# Patient Record
Sex: Female | Born: 1935 | Race: White | Hispanic: No | Smoking: Former smoker
Health system: Southern US, Community
[De-identification: ages and names within clinical notes are randomized; demographics above are authoritative.]

## PROBLEM LIST (undated history)

## (undated) DIAGNOSIS — M1990 Unspecified osteoarthritis, unspecified site: Secondary | ICD-10-CM

## (undated) DIAGNOSIS — J189 Pneumonia, unspecified organism: Secondary | ICD-10-CM

## (undated) DIAGNOSIS — T884XXA Failed or difficult intubation, initial encounter: Secondary | ICD-10-CM

## (undated) DIAGNOSIS — S82899A Other fracture of unspecified lower leg, initial encounter for closed fracture: Secondary | ICD-10-CM

## (undated) DIAGNOSIS — K219 Gastro-esophageal reflux disease without esophagitis: Secondary | ICD-10-CM

## (undated) DIAGNOSIS — C4491 Basal cell carcinoma of skin, unspecified: Secondary | ICD-10-CM

## (undated) DIAGNOSIS — R7303 Prediabetes: Secondary | ICD-10-CM

## (undated) DIAGNOSIS — I10 Essential (primary) hypertension: Secondary | ICD-10-CM

## (undated) HISTORY — DX: Basal cell carcinoma of skin, unspecified: C44.91

## (undated) HISTORY — PX: JOINT REPLACEMENT: SHX530

## (undated) HISTORY — PX: EYE SURGERY: SHX253

## (undated) HISTORY — PX: ABDOMINAL HYSTERECTOMY: SHX81

---

## 1998-08-26 ENCOUNTER — Encounter: Admission: RE | Admit: 1998-08-26 | Discharge: 1998-11-24 | Payer: Self-pay | Admitting: Family Medicine

## 1998-09-23 ENCOUNTER — Ambulatory Visit (HOSPITAL_COMMUNITY): Admission: RE | Admit: 1998-09-23 | Discharge: 1998-09-23 | Payer: Self-pay | Admitting: Neurosurgery

## 1998-10-07 ENCOUNTER — Ambulatory Visit (HOSPITAL_COMMUNITY): Admission: RE | Admit: 1998-10-07 | Discharge: 1998-10-07 | Payer: Self-pay | Admitting: Diagnostic Radiology

## 1998-10-21 ENCOUNTER — Ambulatory Visit (HOSPITAL_COMMUNITY): Admission: RE | Admit: 1998-10-21 | Discharge: 1998-10-21 | Payer: Self-pay | Admitting: Neurosurgery

## 1999-06-26 ENCOUNTER — Ambulatory Visit (HOSPITAL_COMMUNITY): Admission: RE | Admit: 1999-06-26 | Discharge: 1999-06-26 | Payer: Self-pay | Admitting: Gastroenterology

## 1999-07-28 HISTORY — PX: FRACTURE SURGERY: SHX138

## 2000-01-23 ENCOUNTER — Inpatient Hospital Stay (HOSPITAL_COMMUNITY): Admission: AD | Admit: 2000-01-23 | Discharge: 2000-01-27 | Payer: Self-pay | Admitting: Orthopedic Surgery

## 2000-01-23 ENCOUNTER — Encounter: Payer: Self-pay | Admitting: Orthopedic Surgery

## 2000-01-24 ENCOUNTER — Encounter: Payer: Self-pay | Admitting: Orthopedic Surgery

## 2001-06-02 ENCOUNTER — Ambulatory Visit (HOSPITAL_BASED_OUTPATIENT_CLINIC_OR_DEPARTMENT_OTHER): Admission: RE | Admit: 2001-06-02 | Discharge: 2001-06-02 | Payer: Self-pay | Admitting: Orthopedic Surgery

## 2001-10-06 ENCOUNTER — Other Ambulatory Visit: Admission: RE | Admit: 2001-10-06 | Discharge: 2001-10-06 | Payer: Self-pay | Admitting: Gynecology

## 2003-03-13 ENCOUNTER — Other Ambulatory Visit: Admission: RE | Admit: 2003-03-13 | Discharge: 2003-03-13 | Payer: Self-pay | Admitting: Gynecology

## 2004-02-01 ENCOUNTER — Ambulatory Visit (HOSPITAL_COMMUNITY): Admission: RE | Admit: 2004-02-01 | Discharge: 2004-02-01 | Payer: Self-pay | Admitting: Neurosurgery

## 2004-02-29 ENCOUNTER — Encounter: Admission: RE | Admit: 2004-02-29 | Discharge: 2004-02-29 | Payer: Self-pay | Admitting: Neurosurgery

## 2004-03-14 ENCOUNTER — Encounter: Admission: RE | Admit: 2004-03-14 | Discharge: 2004-03-14 | Payer: Self-pay | Admitting: Neurosurgery

## 2004-04-04 ENCOUNTER — Encounter: Admission: RE | Admit: 2004-04-04 | Discharge: 2004-04-04 | Payer: Self-pay | Admitting: Neurosurgery

## 2006-02-26 ENCOUNTER — Ambulatory Visit (HOSPITAL_COMMUNITY): Admission: RE | Admit: 2006-02-26 | Discharge: 2006-02-26 | Payer: Self-pay | Admitting: Family Medicine

## 2006-05-21 ENCOUNTER — Other Ambulatory Visit: Admission: RE | Admit: 2006-05-21 | Discharge: 2006-05-21 | Payer: Self-pay | Admitting: Gynecology

## 2007-07-28 HISTORY — PX: ROTATOR CUFF REPAIR: SHX139

## 2010-09-19 ENCOUNTER — Other Ambulatory Visit: Payer: Self-pay | Admitting: Orthopedic Surgery

## 2010-09-19 ENCOUNTER — Encounter (HOSPITAL_BASED_OUTPATIENT_CLINIC_OR_DEPARTMENT_OTHER)
Admission: RE | Admit: 2010-09-19 | Discharge: 2010-09-19 | Disposition: A | Discharge status: Home / Self Care | Payer: Medicare Other | Source: Ambulatory Visit | Attending: Orthopedic Surgery | Admitting: Orthopedic Surgery

## 2010-09-19 ENCOUNTER — Ambulatory Visit
Admission: RE | Admit: 2010-09-19 | Discharge: 2010-09-19 | Disposition: A | Discharge status: Home / Self Care | Payer: Medicare Other | Source: Ambulatory Visit | Attending: Orthopedic Surgery | Admitting: Orthopedic Surgery

## 2010-09-19 DIAGNOSIS — Z01810 Encounter for preprocedural cardiovascular examination: Secondary | ICD-10-CM | POA: Insufficient documentation

## 2010-09-19 DIAGNOSIS — Z01811 Encounter for preprocedural respiratory examination: Secondary | ICD-10-CM

## 2010-09-23 ENCOUNTER — Other Ambulatory Visit: Payer: Self-pay | Admitting: Orthopedic Surgery

## 2010-09-23 ENCOUNTER — Ambulatory Visit (HOSPITAL_BASED_OUTPATIENT_CLINIC_OR_DEPARTMENT_OTHER)
Admission: RE | Admit: 2010-09-23 | Discharge: 2010-09-24 | Disposition: A | Discharge status: Home / Self Care | Payer: Medicare Other | Source: Ambulatory Visit | Attending: Orthopedic Surgery | Admitting: Orthopedic Surgery

## 2010-09-23 DIAGNOSIS — J449 Chronic obstructive pulmonary disease, unspecified: Secondary | ICD-10-CM | POA: Insufficient documentation

## 2010-09-23 DIAGNOSIS — E669 Obesity, unspecified: Secondary | ICD-10-CM | POA: Insufficient documentation

## 2010-09-23 DIAGNOSIS — Z5333 Arthroscopic surgical procedure converted to open procedure: Secondary | ICD-10-CM | POA: Insufficient documentation

## 2010-09-23 DIAGNOSIS — J4489 Other specified chronic obstructive pulmonary disease: Secondary | ICD-10-CM | POA: Insufficient documentation

## 2010-09-23 DIAGNOSIS — M19019 Primary osteoarthritis, unspecified shoulder: Secondary | ICD-10-CM | POA: Insufficient documentation

## 2010-09-23 DIAGNOSIS — Z01812 Encounter for preprocedural laboratory examination: Secondary | ICD-10-CM | POA: Insufficient documentation

## 2010-09-23 DIAGNOSIS — F329 Major depressive disorder, single episode, unspecified: Secondary | ICD-10-CM | POA: Insufficient documentation

## 2010-09-23 DIAGNOSIS — M719 Bursopathy, unspecified: Secondary | ICD-10-CM | POA: Insufficient documentation

## 2010-09-23 DIAGNOSIS — K219 Gastro-esophageal reflux disease without esophagitis: Secondary | ICD-10-CM | POA: Insufficient documentation

## 2010-09-23 DIAGNOSIS — Z87891 Personal history of nicotine dependence: Secondary | ICD-10-CM | POA: Insufficient documentation

## 2010-09-23 DIAGNOSIS — M67919 Unspecified disorder of synovium and tendon, unspecified shoulder: Secondary | ICD-10-CM | POA: Insufficient documentation

## 2010-09-23 DIAGNOSIS — F3289 Other specified depressive episodes: Secondary | ICD-10-CM | POA: Insufficient documentation

## 2010-09-23 DIAGNOSIS — M24119 Other articular cartilage disorders, unspecified shoulder: Secondary | ICD-10-CM | POA: Insufficient documentation

## 2010-09-23 LAB — POCT HEMOGLOBIN-HEMACUE: Hemoglobin: 14.7 g/dL (ref 12.0–15.0)

## 2010-09-25 NOTE — Op Note (Signed)
[name], [name]             ACCOUNT NO.:  [id number]  MEDICAL RECORD NO.:  [id number]            PATIENT TYPE:  LOCATION:                                 FACILITY:  PHYSICIAN:  Katy Fitch. Rogerick Baldwin, M.D.      DATE OF BIRTH:  DATE OF PROCEDURE:  09/23/2010 DATE OF DISCHARGE:                              OPERATIVE REPORT   PREOPERATIVE DIAGNOSIS:  Large degenerative retracted supraspinatus, infraspinatus rotator cuff tears with intrasubstance segmental tearing of supraspinatus tendon, also type 3 acromion with acromioclavicular arthropathy and arthroscopically documented significant degenerative labral changes.  POSTOPERATIVE DIAGNOSIS:  Large degenerative retracted supraspinatus, infraspinatus rotator cuff tears with intrasubstance segmental tearing of supraspinatus tendon, also type 3 acromion with acromioclavicular arthropathy and arthroscopically documented significant degenerative labral changes.  OPERATIONS:1. Diagnostic arthroscopy, right glenohumeral joint with extensive     labral debridement, synovectomy, and rotator cuff articular side     debridement with documentation of cuff tear predicament. 2. Arthroscopic subacromial bursectomy, coracoacromial ligament,     relaxation and acromioplasty with removal of large anterior medial     and lateral osteophytes. 3. Arthroscopic resection of distal clavicle. 4. Open reconstruction of retracted segmental supraspinatus,     infraspinatus rotator cuff tears utilizing double-row technique     with two PEEK corkscrews at joint line and a combination of fiber     tape mattress sutures and compression sutures with the tails of the     medial row sutures to two lateral swivel locks.  OPERATING SURGEON:  Katy Fitch. Alyene Predmore, MD  ASSISTANT:  Marveen Reeks Dasnoit PA-C  ANESTHESIA:  General endotracheal supplemented by a right interscalene block.  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  She has had the abrupt onset of significant right shoulder pain in the past several months with night pain, weakness of abduction, external rotation and difficulty with self-care and dressing.  She was referred for evaluation of management of the right shoulder.  Clinical examination revealed a type 3 acromion, AC arthropathy, reactive bone growth at the greater tuberosity and marked weakness subscaption, abduction and external rotation.  On a clinical basis, it appeared Mary Moss had a significant rotator cuff tear.  She was sent for an MRI of her shoulder, which documented a large retracted supraspinous rotator cuff tear and complete undermining of the infraspinatus.  She had labral degenerative changes.  The subscap had minor tendinopathy.  She had type 3 acromion and AC arthropathy.  We advised Mary Moss that as her cuff tear was retracting that if she wanted to repair that she should proceed with repair promptly.  She was scheduled for surgery at this time.  Preoperatively, she was advised the risks and benefits of surgery.  She understands that due to her age and quality of her tendon with a chance that we could lose purchase of the sutures on her tendon with premature shoulder motion or excessive force in the first 12 weeks following surgery.  We will carefully guide her through a very simple exercise in early postoperative period and  try to avoid any disruption of the surgical construct.  She understood that we would decompress her rotator cuff by acromioplasty, distal clavicle resection and bursectomy.  Questions were invited and answered in detail.  Preoperatively, her health status was inventoried by myself and Dr. Jairo Ben, the attending anesthesiologist.  She is cared for by Dr. Vernon Prey at [facility].  She has no known drug allergies.  Dr. Jean Rosenthal provided detailed informed  anesthesia consent recommending an interscalene block.  This was placed with ropivacaine without complication in the holding area leading to excellent anesthesia of the right forequarter.  Questions were invited and answered in detail in the preoperative holding area followed by transfer of Mary Moss to room #2 of the [facility].  Under Dr. Edison Pace direct supervision, general anesthesia by endotracheal technique was induced followed by careful positioning of Mary Moss in the beach-chair position with aid of a torso and head holder designed for shoulder arthroscopy.  The entire right upper extremity and forequarter were prepped with DuraPrep and draped with impervious arthroscopy drapes.  After routine surgical time-out and confirmation that she had received 1 g of Ancef in the holding area as an IV prophylactic antibiotic, surgery commenced with instrumentation of the shoulder through a standard posterior- viewing portal blunt with technique.  Diagnostic arthroscopy confirmed a segmental degenerative tear of the supraspinous, infraspinatus, labral degenerative changes and considerable synovitis.  An anterior portal was created under direct vision followed by use of a suction shaver to debride synovitis, the labrum and the deep surface of the rotator cuff. After photographic documentation of the cuff tear predicament and the status of the joint, the arthroscope was removed from the glenohumeral joint and placed in the subacromial space.  Extensive bursectomy was accomplished with the suction shaver brought in posterolaterally.  The coracoacromial ligament was identified with release of electrocautery. The type 3 acromion was leveled to a type 1 morphology with a suction shaver medially, anteriorly and laterally and the capsule of the Mary Moss joint was debrided.  There was a large inferior projecting osteophyte at the distal clavicle that was removed with the suction bur and  the distal centimeter clavicle was removed with the suction bur.  After hemostasis was achieved including the acromial branch, we removed the arthroscopic equipment and proceeded to a muscle-splitting incision between the anterior middle thirds of the deltoid.  This immediately revealed hypertrophic bursa laterally that was removed the lateral gutter.  The tear was segmental with several areas of delamination of the supraspinatus and the rotator interval was delaminated directly over the long head of the biceps.  The margin of the repair were freshened.  The infraspinatus was elevated.  A power bur was used to lower the profile of the greater tuberosity 3 mm to a bleeding bone surface.  Two PEEK medial corkscrew anchors were placed, one at the central aspect of the infraspinatus and one at the anterior margin along the joint line for the supraspinatus.  A Scorpion suture passer was used to place a baseball stitch across the rotator interval allowing margin convergence and re-lamination of the layers of the supraspinatus.  The infraspinatus was gathered and brought anteriorly with a second mattress suture of fiber tape.  The corkscrew anchors were used to anatomically replace the medial footprint of the supraspinatus, infraspinatus followed by use of a baseball stitch to close the rotator interval.  The two fiber tapes were gathered into a pair of swivel locks laterally with crisscross compression  technique for the lateral margin of the supraspinatus and infraspinatus.  Excellent inset was achieved with low profile.  A hand rasp was used to feather the edge of the acromion, so that there would be no impingement postoperatively.  After feathers, bursectomy and hemostasis, the subacromial space was power irrigated with the scope cannula followed by repair of the deltoid with interrupted suture of 0 Vicryl followed by repair of the skin with subcutaneous 2-0 Vicryl with interdermal 3-0  Prolene.  A very satisfactory repair was achieved.  Due to the fragile nature of Mary Moss' tendon, we will move very slow in the postoperative period.  We will have her perform very minimal pendulum exercises and allow healing of the rotator cuff to the tuberosity prior to initiating a significant exercise program.     Katy Fitch. Semir Brill, M.D.     RVS/MEDQ  D:  09/23/2010  T:  09/24/2010  Job:  [id number]  cc:   Ernestina Penna, M.D.  Electronically Signed by Josephine Igo M.D. on 09/25/2010 10:25:13 AM

## 2011-08-06 DIAGNOSIS — L570 Actinic keratosis: Secondary | ICD-10-CM | POA: Diagnosis not present

## 2011-08-06 DIAGNOSIS — D1801 Hemangioma of skin and subcutaneous tissue: Secondary | ICD-10-CM | POA: Diagnosis not present

## 2012-01-04 ENCOUNTER — Other Ambulatory Visit: Payer: Self-pay | Admitting: Dermatology

## 2012-01-04 DIAGNOSIS — C44519 Basal cell carcinoma of skin of other part of trunk: Secondary | ICD-10-CM | POA: Diagnosis not present

## 2012-01-04 DIAGNOSIS — L821 Other seborrheic keratosis: Secondary | ICD-10-CM | POA: Diagnosis not present

## 2012-01-04 DIAGNOSIS — D239 Other benign neoplasm of skin, unspecified: Secondary | ICD-10-CM | POA: Diagnosis not present

## 2012-01-04 DIAGNOSIS — L570 Actinic keratosis: Secondary | ICD-10-CM | POA: Diagnosis not present

## 2012-01-04 DIAGNOSIS — L578 Other skin changes due to chronic exposure to nonionizing radiation: Secondary | ICD-10-CM | POA: Diagnosis not present

## 2012-02-20 DIAGNOSIS — S0510XA Contusion of eyeball and orbital tissues, unspecified eye, initial encounter: Secondary | ICD-10-CM | POA: Diagnosis not present

## 2012-02-20 DIAGNOSIS — IMO0002 Reserved for concepts with insufficient information to code with codable children: Secondary | ICD-10-CM | POA: Diagnosis not present

## 2012-04-11 DIAGNOSIS — H2510 Age-related nuclear cataract, unspecified eye: Secondary | ICD-10-CM | POA: Diagnosis not present

## 2012-05-20 DIAGNOSIS — Z23 Encounter for immunization: Secondary | ICD-10-CM | POA: Diagnosis not present

## 2012-07-08 DIAGNOSIS — Z1231 Encounter for screening mammogram for malignant neoplasm of breast: Secondary | ICD-10-CM | POA: Diagnosis not present

## 2012-07-25 DIAGNOSIS — I10 Essential (primary) hypertension: Secondary | ICD-10-CM | POA: Diagnosis not present

## 2012-07-25 DIAGNOSIS — R05 Cough: Secondary | ICD-10-CM | POA: Diagnosis not present

## 2012-07-25 DIAGNOSIS — J029 Acute pharyngitis, unspecified: Secondary | ICD-10-CM | POA: Diagnosis not present

## 2012-07-25 DIAGNOSIS — J189 Pneumonia, unspecified organism: Secondary | ICD-10-CM | POA: Diagnosis not present

## 2012-07-25 DIAGNOSIS — R5381 Other malaise: Secondary | ICD-10-CM | POA: Diagnosis not present

## 2012-08-02 DIAGNOSIS — J189 Pneumonia, unspecified organism: Secondary | ICD-10-CM | POA: Diagnosis not present

## 2012-08-29 DIAGNOSIS — Z79899 Other long term (current) drug therapy: Secondary | ICD-10-CM | POA: Diagnosis not present

## 2012-08-29 DIAGNOSIS — R7309 Other abnormal glucose: Secondary | ICD-10-CM | POA: Diagnosis not present

## 2012-08-29 DIAGNOSIS — R5383 Other fatigue: Secondary | ICD-10-CM | POA: Diagnosis not present

## 2012-08-29 DIAGNOSIS — E559 Vitamin D deficiency, unspecified: Secondary | ICD-10-CM | POA: Diagnosis not present

## 2012-08-29 DIAGNOSIS — E785 Hyperlipidemia, unspecified: Secondary | ICD-10-CM | POA: Diagnosis not present

## 2012-08-29 DIAGNOSIS — R5381 Other malaise: Secondary | ICD-10-CM | POA: Diagnosis not present

## 2012-10-10 ENCOUNTER — Other Ambulatory Visit: Payer: Self-pay | Admitting: *Deleted

## 2012-11-08 ENCOUNTER — Telehealth: Payer: Self-pay | Admitting: Family Medicine

## 2012-11-08 DIAGNOSIS — H1130 Conjunctival hemorrhage, unspecified eye: Secondary | ICD-10-CM | POA: Diagnosis not present

## 2012-11-08 NOTE — Telephone Encounter (Signed)
Offered patient an appt for 4-17 but patient refused. No appts available today

## 2012-11-18 DIAGNOSIS — H10509 Unspecified blepharoconjunctivitis, unspecified eye: Secondary | ICD-10-CM | POA: Diagnosis not present

## 2012-12-14 ENCOUNTER — Ambulatory Visit (INDEPENDENT_AMBULATORY_CARE_PROVIDER_SITE_OTHER): Payer: Medicare Other | Admitting: Pharmacist

## 2012-12-14 ENCOUNTER — Encounter: Payer: Self-pay | Admitting: Pharmacist

## 2012-12-14 ENCOUNTER — Ambulatory Visit (INDEPENDENT_AMBULATORY_CARE_PROVIDER_SITE_OTHER): Payer: Medicare Other

## 2012-12-14 VITALS — Ht 67.5 in | Wt 217.0 lb

## 2012-12-14 DIAGNOSIS — M949 Disorder of cartilage, unspecified: Secondary | ICD-10-CM

## 2012-12-14 DIAGNOSIS — M899 Disorder of bone, unspecified: Secondary | ICD-10-CM | POA: Diagnosis not present

## 2012-12-14 DIAGNOSIS — E559 Vitamin D deficiency, unspecified: Secondary | ICD-10-CM | POA: Diagnosis not present

## 2012-12-14 DIAGNOSIS — M8580 Other specified disorders of bone density and structure, unspecified site: Secondary | ICD-10-CM | POA: Insufficient documentation

## 2012-12-14 DIAGNOSIS — F411 Generalized anxiety disorder: Secondary | ICD-10-CM | POA: Insufficient documentation

## 2012-12-14 MED ORDER — VITAMIN D 1000 UNITS PO CAPS: 1000.0000 [IU] | ORAL_CAPSULE | Freq: Every day | ORAL | Status: DC

## 2012-12-14 NOTE — Progress Notes (Signed)
May want to fill in the numbers on this report

## 2012-12-14 NOTE — Progress Notes (Signed)
Patient ID: Mary Moss, female   DOB: [birth year], 77 y.o.   MRN: [id number]  [facility] Current Height: Height: 5' 7.5" (171.5 cm)      Max Lifetime Height:  5'9" Current Weight: Weight: 217 lb (98.431 kg)       Ethnicity:Caucasian    HPI: Does pt already have a diagnosis of:  Osteopenia?  Yes Osteoporosis?  No  Back Pain?  No       Kyphosis?  No Prior fracture?  No Med(s) for Osteoporosis/Osteopenia:  none Med(s) previously tried for Osteoporosis/Osteopenia:  none                                                             PMH: Age at menopause:  77 yo Hysterectomy?  Yes Oophorectomy?  Yes HRT? Yes - Former.  Type/duration: premarin for about 15 years Steroid Use?  No Thyroid med?  No History of cancer?  Yes - basal cell skin cancer History of digestive disorders (ie Crohn's)?  No Current or previous eating disorders?  No Last Vitamin D Result:  18 (08/2012) Last GFR Result:  86 (08/2012)   FH/SH: Family history of osteoporosis?  No Parent with history of hip fracture?  No Family history of breast cancer?  No Exercise?  No Smoking?  No - quit 2009 Alcohol?  No    Calcium Assessment Calcium Intake  # of servings/day  Calcium mg  Milk (8 oz) 0  x  300  = 0  Yogurt (4 oz) 1 x  200 = 200mg   Cheese (1 oz) 1/2 x  200 = 100mg   Other Calcium sources   250mg   Ca supplement 0 = 0   Estimated calcium intake per day 550mg     DEXA Results Date of Test T-Score for AP Spine L1-L4 T-Score for Total Left Hip T-Score for Total Right Hip  12/14/2012 -0.3 -0.8 -0.4  07/03/2009 -0.7 -0.8 -0.7              Assessment: Normal BMD - stable  Low vitamin D and Calcium intake  Recommendations: 1.  Start vitamin D 1000IU daily - recheck Vitamin D level in 2-3 months 2.  recommend calcium 1200mg  daily either through supplementation   or diet.   3.  recommend weight bearing exercise - 30 minutes at least 4 days   per week.  Suggested silver sneakers program at  [facility]. 4.  Counseled and educated about fall risk and prevention.  Recheck DEXA:  2 years  Time spent counseling patient:  20 minutes

## 2012-12-14 NOTE — Patient Instructions (Addendum)
Increase calcium intake - goal 1200mg  daily from diet and /or supplements Start vitamin D3 1000IU daily  Fall Prevention and Home Safety Falls cause injuries and can affect all age groups. It is possible to use preventive measures to significantly decrease the likelihood of falls. There are many simple measures which can make your home safer and prevent falls. OUTDOORS  Repair cracks and edges of walkways and driveways.  Remove high doorway thresholds.  Trim shrubbery on the main path into your home.  Have good outside lighting.  Clear walkways of tools, rocks, debris, and clutter.  Check that handrails are not broken and are securely fastened. Both sides of steps should have handrails.  Have leaves, snow, and ice cleared regularly.  Use sand or salt on walkways during winter months.  In the garage, clean up grease or oil spills. BATHROOM  Install night lights.  Install grab bars by the toilet and in the tub and shower.  Use non-skid mats or decals in the tub or shower.  Place a plastic non-slip stool in the shower to sit on, if needed.  Keep floors dry and clean up all water on the floor immediately.  Remove soap buildup in the tub or shower on a regular basis.  Secure bath mats with non-slip, double-sided rug tape.  Remove throw rugs and tripping hazards from the floors. BEDROOMS  Install night lights.  Make sure a bedside light is easy to reach.  Do not use oversized bedding.  Keep a telephone by your bedside.  Have a firm chair with side arms to use for getting dressed.  Remove throw rugs and tripping hazards from the floor. KITCHEN  Keep handles on pots and pans turned toward the center of the stove. Use back burners when possible.  Clean up spills quickly and allow time for drying.  Avoid walking on wet floors.  Avoid hot utensils and knives.  Position shelves so they are not too high or low.  Place commonly used objects within easy reach.  If  necessary, use a sturdy step stool with a grab bar when reaching.  Keep electrical cables out of the way.  Do not use floor polish or wax that makes floors slippery. If you must use wax, use non-skid floor wax.  Remove throw rugs and tripping hazards from the floor. STAIRWAYS  Never leave objects on stairs.  Place handrails on both sides of stairways and use them. Fix any loose handrails. Make sure handrails on both sides of the stairways are as long as the stairs.  Check carpeting to make sure it is firmly attached along stairs. Make repairs to worn or loose carpet promptly.  Avoid placing throw rugs at the top or bottom of stairways, or properly secure the rug with carpet tape to prevent slippage. Get rid of throw rugs, if possible.  Have an electrician put in a light switch at the top and bottom of the stairs. OTHER FALL PREVENTION TIPS  Wear low-heel or rubber-soled shoes that are supportive and fit well. Wear closed toe shoes.  When using a stepladder, make sure it is fully opened and both spreaders are firmly locked. Do not climb a closed stepladder.  Add color or contrast paint or tape to grab bars and handrails in your home. Place contrasting color strips on first and last steps.  Learn and use mobility aids as needed. Install an electrical emergency response system.  Turn on lights to avoid dark areas. Replace light bulbs that burn out immediately.  Get light switches that glow.  Arrange furniture to create clear pathways. Keep furniture in the same place.  Firmly attach carpet with non-skid or double-sided tape.  Eliminate uneven floor surfaces.  Select a carpet pattern that does not visually hide the edge of steps.  Be aware of all pets. OTHER HOME SAFETY TIPS  Set the water temperature for 120 F (48.8 C).  Keep emergency numbers on or near the telephone.  Keep smoke detectors on every level of the home and near sleeping areas. Document Released: 07/03/2002  Document Revised: 01/12/2012 Document Reviewed: 10/02/2011 [facility] Patient Information 2014 [place].

## 2013-05-31 ENCOUNTER — Ambulatory Visit (INDEPENDENT_AMBULATORY_CARE_PROVIDER_SITE_OTHER): Payer: Medicare Other

## 2013-05-31 DIAGNOSIS — Z23 Encounter for immunization: Secondary | ICD-10-CM | POA: Diagnosis not present

## 2013-07-03 ENCOUNTER — Other Ambulatory Visit: Payer: Self-pay | Admitting: Dermatology

## 2013-07-03 DIAGNOSIS — L570 Actinic keratosis: Secondary | ICD-10-CM | POA: Diagnosis not present

## 2013-07-03 DIAGNOSIS — L821 Other seborrheic keratosis: Secondary | ICD-10-CM | POA: Diagnosis not present

## 2013-07-03 DIAGNOSIS — L820 Inflamed seborrheic keratosis: Secondary | ICD-10-CM | POA: Diagnosis not present

## 2013-07-03 DIAGNOSIS — Z1231 Encounter for screening mammogram for malignant neoplasm of breast: Secondary | ICD-10-CM | POA: Diagnosis not present

## 2013-07-03 DIAGNOSIS — Z85828 Personal history of other malignant neoplasm of skin: Secondary | ICD-10-CM | POA: Diagnosis not present

## 2013-07-03 DIAGNOSIS — C44519 Basal cell carcinoma of skin of other part of trunk: Secondary | ICD-10-CM | POA: Diagnosis not present

## 2013-07-03 DIAGNOSIS — C44711 Basal cell carcinoma of skin of unspecified lower limb, including hip: Secondary | ICD-10-CM | POA: Diagnosis not present

## 2013-07-15 ENCOUNTER — Emergency Department (HOSPITAL_COMMUNITY): Payer: Medicare Other

## 2013-07-15 ENCOUNTER — Emergency Department (HOSPITAL_COMMUNITY)
Admission: EM | Admit: 2013-07-15 | Discharge: 2013-07-15 | Disposition: A | Discharge status: Home / Self Care | Payer: Medicare Other | Attending: Emergency Medicine | Admitting: Emergency Medicine

## 2013-07-15 ENCOUNTER — Encounter (HOSPITAL_COMMUNITY): Payer: Self-pay | Admitting: Emergency Medicine

## 2013-07-15 DIAGNOSIS — M25561 Pain in right knee: Secondary | ICD-10-CM

## 2013-07-15 DIAGNOSIS — Z87891 Personal history of nicotine dependence: Secondary | ICD-10-CM | POA: Insufficient documentation

## 2013-07-15 DIAGNOSIS — Z791 Long term (current) use of non-steroidal anti-inflammatories (NSAID): Secondary | ICD-10-CM | POA: Diagnosis not present

## 2013-07-15 DIAGNOSIS — M25569 Pain in unspecified knee: Secondary | ICD-10-CM | POA: Diagnosis not present

## 2013-07-15 MED ORDER — MELOXICAM 7.5 MG PO TABS: ORAL_TABLET | ORAL | Status: DC

## 2013-07-15 NOTE — ED Provider Notes (Signed)
This chart was scribed for Ward Givens, MD by Bennett Scrape, ED Scribe. This patient was seen in room APFT22/APFT22 and the patient's care was started at 4:53 PM.  HPI Comments: Mary Moss is a 77 y.o. female who presents to the Emergency Department complaining of posterior right knee pain for the past 3 weeks. She states that she feels like it locks up and becomes stiff. She denies any prior injuries to the knee or having an orthopedist. Pt states Dr Lequita Halt is her family's knee doctor.   4:53 PM- Informed pt that the problems may be mensicus related. Advised pt that she will need to f/u with an orthopedist. Discussed using the knee brace with pt and pt agreed.   Medical screening examination/treatment/procedure(s) were conducted as a shared visit with non-physician practitioner(s) and myself.  I personally evaluated the patient during the encounter.  EKG Interpretation   None        Devoria Albe, MD, Armando Gang   Ward Givens, MD 07/15/13 [phone]

## 2013-07-15 NOTE — ED Notes (Signed)
Pain to right knee x 3 wks.  Denies injury.  States "it locks up on me and I almost fall."

## 2013-07-16 NOTE — ED Provider Notes (Signed)
CSN: [id number]     Arrival date & time 07/15/13  1528 History   First MD Initiated Contact with Patient 07/15/13 1546     Chief Complaint  Patient presents with  . Knee Pain   (Consider location/radiation/quality/duration/timing/severity/associated sxs/prior Treatment) Patient is a 77 y.o. female presenting with knee pain. The history is provided by the patient.  Knee Pain Location:  Knee Time since incident:  3 weeks Injury: no   Knee location:  R knee Pain details:    Quality:  Aching and sharp   Radiates to:  Does not radiate   Severity:  Moderate   Onset quality:  Gradual   Timing:  Constant   Progression:  Worsening Chronicity:  New Dislocation: no   Foreign body present:  No foreign bodies Prior injury to area:  No Relieved by:  Rest Worsened by:  Flexion and bearing weight Ineffective treatments:  NSAIDs Associated symptoms: no back pain, no decreased ROM, no fever, no neck pain, no numbness, no stiffness, no swelling and no tingling    Patient with hx of posterior right knee pain for 3 weeks.  She denies known injury.  States the pain is intermittent and worse with weight bearing and flexion of the knee.  Describes a sharp "stinging" pain to the back of her knee that radiates into the lower thigh.  Denies pain to the medial, lateral or anterior knee.  She denies known injury or fall.  She also denies swelling, numbness, redness, fever or chills.    No hx of previous DVT or recent travel   History reviewed. No pertinent past medical history. Past Surgical History  Procedure Laterality Date  . Abdominal hysterectomy    . Rotator cuff repair    . Fracture surgery     No family history on file. History  Substance Use Topics  . Smoking status: Former Smoker    Types: Cigarettes    Start date: 07/27/1957    Quit date: 12/15/2007  . Smokeless tobacco: Not on file  . Alcohol Use: No   OB History   Grav Para Term Preterm Abortions TAB SAB Ect Mult Living           Review of Systems  Constitutional: Negative for fever and chills.  Genitourinary: Negative for dysuria and difficulty urinating.  Musculoskeletal: Positive for arthralgias. Negative for back pain, gait problem, joint swelling, neck pain and stiffness.  Skin: Negative for color change and wound.  Neurological: Negative for weakness and numbness.  All other systems reviewed and are negative.    Allergies  Review of patient's allergies indicates no known allergies.  Home Medications   Current Outpatient Rx  Name  Route  Sig  Dispense  Refill  . meloxicam (MOBIC) 7.5 MG tablet      One tablet po BID .  Take with food   20 tablet   0    BP 154/77  Pulse 85  Temp(Src) 97.7 F (36.5 C) (Oral)  Resp 20  Ht 5\' 8"  (1.727 m)  Wt 215 lb (97.523 kg)  BMI 32.70 kg/m2  SpO2 92% Physical Exam  Nursing note and vitals reviewed. Constitutional: She is oriented to person, place, and time. She appears well-developed and well-nourished. No distress.  HENT:  Head: Normocephalic and atraumatic.  Cardiovascular: Normal rate, regular rhythm, normal heart sounds and intact distal pulses.   No murmur heard. Pulmonary/Chest: Effort normal and breath sounds normal. No respiratory distress.  Musculoskeletal: She exhibits tenderness. She exhibits no edema.  Right knee: She exhibits normal range of motion, no swelling, no effusion, no ecchymosis, no deformity, no erythema, no LCL laxity, no bony tenderness and no MCL laxity. Tenderness found. No medial joint line, no lateral joint line and no patellar tendon tenderness noted.       Legs: ttp along the popliteal fossa of the right knee.  No erythema, effusion, excessive warmth or step-off deformity.  Patient has full ROM of the knee.  DP pulse brisk, distal sensation intact. Calf is soft and NT.  Neurological: She is alert and oriented to person, place, and time. She exhibits normal muscle tone. Coordination normal.  Skin: Skin is warm  and dry. No erythema.    ED Course  Procedures (including critical care time) Labs Review Labs Reviewed - No data to display Imaging Review Dg Knee Complete 4 Views Right  07/15/2013   CLINICAL DATA:  Knee pain.  EXAM: RIGHT KNEE - COMPLETE 4+ VIEW  COMPARISON:  None.  FINDINGS: No acute bony abnormality. Specifically, no fracture, subluxation, or dislocation. Soft tissues are intact. Joint spaces are maintained. Normal bone mineralization. No significant joint effusion.  IMPRESSION: No acute bony abnormality.   Electronically Signed   By: Charlett Nose M.D.   On: 07/15/2013 16:06    EKG Interpretation   None       MDM   1. Knee pain, right     XR findings discussed with the patient.  No concerning sx's for septic joint or DVT.    Knee immobilizer applied, pain improved, remains NV intact  Discussed possible Baker's cyst vs meniscal injury.  Pt requests referral to Dr.Aluisio.  Will provide symptomatic tx with mobic, rest, ice.  Pt verbalized understanding and agrees to plan.  Appears stable for discharge    Abbye Lao L. Trisha Mangle, PA-C 07/16/13 [phone]

## 2013-07-16 NOTE — ED Provider Notes (Signed)
See prior note   Ward Givens, MD 07/16/13 [phone]

## 2013-07-27 DIAGNOSIS — C4491 Basal cell carcinoma of skin, unspecified: Secondary | ICD-10-CM

## 2013-07-27 HISTORY — DX: Basal cell carcinoma of skin, unspecified: C44.91

## 2013-08-10 DIAGNOSIS — M25569 Pain in unspecified knee: Secondary | ICD-10-CM | POA: Diagnosis not present

## 2013-08-16 DIAGNOSIS — M25569 Pain in unspecified knee: Secondary | ICD-10-CM | POA: Diagnosis not present

## 2013-08-28 DIAGNOSIS — M25569 Pain in unspecified knee: Secondary | ICD-10-CM | POA: Diagnosis not present

## 2013-11-01 ENCOUNTER — Other Ambulatory Visit: Payer: Self-pay | Admitting: Dermatology

## 2013-11-01 DIAGNOSIS — Z85828 Personal history of other malignant neoplasm of skin: Secondary | ICD-10-CM | POA: Diagnosis not present

## 2013-11-01 DIAGNOSIS — C44319 Basal cell carcinoma of skin of other parts of face: Secondary | ICD-10-CM | POA: Diagnosis not present

## 2013-11-24 ENCOUNTER — Ambulatory Visit (INDEPENDENT_AMBULATORY_CARE_PROVIDER_SITE_OTHER): Payer: Medicare Other | Admitting: Family

## 2013-11-24 VITALS — BP 165/90 | HR 87 | Temp 97.1°F | Ht 68.0 in | Wt 219.0 lb

## 2013-11-24 DIAGNOSIS — J019 Acute sinusitis, unspecified: Secondary | ICD-10-CM

## 2013-11-24 DIAGNOSIS — R509 Fever, unspecified: Secondary | ICD-10-CM | POA: Diagnosis not present

## 2013-11-24 LAB — POCT INFLUENZA A/B
INFLUENZA A, POC: NEGATIVE
INFLUENZA B, POC: NEGATIVE

## 2013-11-24 LAB — POCT RAPID STREP A (OFFICE): RAPID STREP A SCREEN: NEGATIVE

## 2013-11-24 MED ORDER — AMOXICILLIN 500 MG PO CAPS
500.0000 mg | ORAL_CAPSULE | Freq: Two times a day (BID) | ORAL | Status: DC
Start: 2013-11-24 — End: 2013-12-08

## 2013-11-24 MED ORDER — BENZONATATE 200 MG PO CAPS: 200.0000 mg | ORAL_CAPSULE | Freq: Three times a day (TID) | ORAL | Status: DC | PRN

## 2013-11-24 NOTE — Progress Notes (Signed)
Subjective:    Patient ID: Mary Moss, female    DOB: 09/10/1935, 78 y.o.   MRN: [id number]  Cough This is a new problem. The current episode started 1 to 4 weeks ago. The problem has been gradually worsening. The problem occurs every few minutes. The cough is productive of purulent sputum. Associated symptoms include chills, a fever, headaches, nasal congestion, a sore throat, shortness of breath and wheezing. Pertinent negatives include no ear congestion, ear pain or postnasal drip. The symptoms are aggravated by pollens. Treatments tried: Saline spray and Tyelnol. The treatment provided mild relief. There is no history of asthma or COPD.      Review of Systems  Constitutional: Positive for fever and chills.  HENT: Positive for sore throat. Negative for ear pain and postnasal drip.   Respiratory: Positive for cough, shortness of breath and wheezing.   Gastrointestinal: Negative.   Genitourinary: Negative.   Musculoskeletal: Negative.   Neurological: Positive for headaches.  All other systems reviewed and are negative.      Objective:   Physical Exam  Vitals reviewed. Constitutional: She is oriented to person, place, and time. She appears well-developed and well-nourished. No distress.  HENT:  Head: Normocephalic and atraumatic.  Right Ear: External ear normal.  Left Ear: External ear normal.  Nasal passage erythemas with mild swelling, oropharyngeal mildly erythemas  Eyes: Pupils are equal, round, and reactive to light.  Neck: Normal range of motion. Neck supple. No thyromegaly present.  Cardiovascular: Normal rate, regular rhythm, normal heart sounds and intact distal pulses.   No murmur heard. Pulmonary/Chest: Effort normal and breath sounds normal. No respiratory distress. She has no wheezes.  Musculoskeletal: Normal range of motion. She exhibits no edema and no tenderness.  Neurological: She is alert and oriented to person, place, and time. She has normal  reflexes.  Skin: Skin is warm and dry.  Psychiatric: She has a normal mood and affect. Her behavior is normal. Judgment and thought content normal.    Results for orders placed in visit on 11/24/13  POCT RAPID STREP A (OFFICE)      Result Value Ref Range   Rapid Strep A Screen Negative  Negative  POCT INFLUENZA A/B      Result Value Ref Range   Influenza A, POC Negative     Influenza B, POC Negative         BP 160/84  Pulse 87  Temp(Src) 97.1 F (36.2 C) (Oral)  Ht 5\' 8"  (1.727 m)  Wt 219 lb (99.338 kg)  BMI 33.31 kg/m2  Assessment & Plan:  1. Fever, unspecified - POCT rapid strep A - POCT Influenza A/B - Strep A culture, throat  2. Sinusitis, acute - Take meds as prescribed - Use a cool mist humidifier  -Use saline nose sprays frequently -Saline irrigations of the nose can be very helpful if done frequently.  * 4X daily for 1 week*  * Use of a nettie pot can be helpful with this. Follow directions with this* -Force fluids -For any cough or congestion  Use plain Mucinex- regular strength or max strength is fine   * Children- consult with Pharmacist for dosing -For fever or aces or pains- take tylenol or ibuprofen appropriate for age and weight.  * for fevers greater than 101 orally you may alternate ibuprofen and tylenol every  3 hours. -Throat lozenges if help -New toothbrush in 3 days -Start daily Claritin for at least next month  3. B/P -Discussed pt's elevated  B/P -Info on DASH diet given -RTO in 2 weeks for recheck   Evelina Dun, FNP

## 2013-11-24 NOTE — Patient Instructions (Addendum)
Sinusitis Sinusitis is redness, soreness, and swelling (inflammation) of the paranasal sinuses. Paranasal sinuses are air pockets within the bones of your face (beneath the eyes, the middle of the forehead, or above the eyes). In healthy paranasal sinuses, mucus is able to drain out, and air is able to circulate through them by way of your nose. However, when your paranasal sinuses are inflamed, mucus and air can become trapped. This can allow bacteria and other germs to grow and cause infection. Sinusitis can develop quickly and last only a short time (acute) or continue over a long period (chronic). Sinusitis that lasts for more than 12 weeks is considered chronic.  CAUSES  Causes of sinusitis include:  Allergies.  Structural abnormalities, such as displacement of the cartilage that separates your nostrils (deviated septum), which can decrease the air flow through your nose and sinuses and affect sinus drainage.  Functional abnormalities, such as when the small hairs (cilia) that line your sinuses and help remove mucus do not work properly or are not present. SYMPTOMS  Symptoms of acute and chronic sinusitis are the same. The primary symptoms are pain and pressure around the affected sinuses. Other symptoms include:  Upper toothache.  Earache.  Headache.  Bad breath.  Decreased sense of smell and taste.  A cough, which worsens when you are lying flat.  Fatigue.  Fever.  Thick drainage from your nose, which often is green and may contain pus (purulent).  Swelling and warmth over the affected sinuses. DIAGNOSIS  Your caregiver will perform a physical exam. During the exam, your caregiver may:  Look in your nose for signs of abnormal growths in your nostrils (nasal polyps).  Tap over the affected sinus to check for signs of infection.  View the inside of your sinuses (endoscopy) with a special imaging device with a light attached (endoscope), which is inserted into your  sinuses. If your caregiver suspects that you have chronic sinusitis, one or more of the following tests may be recommended:  Allergy tests.  Nasal culture A sample of mucus is taken from your nose and sent to a lab and screened for bacteria.  Nasal cytology A sample of mucus is taken from your nose and examined by your caregiver to determine if your sinusitis is related to an allergy. TREATMENT  Most cases of acute sinusitis are related to a viral infection and will resolve on their own within 10 days. Sometimes medicines are prescribed to help relieve symptoms (pain medicine, decongestants, nasal steroid sprays, or saline sprays).  However, for sinusitis related to a bacterial infection, your caregiver will prescribe antibiotic medicines. These are medicines that will help kill the bacteria causing the infection.  Rarely, sinusitis is caused by a fungal infection. In theses cases, your caregiver will prescribe antifungal medicine. For some cases of chronic sinusitis, surgery is needed. Generally, these are cases in which sinusitis recurs more than 3 times per year, despite other treatments. HOME CARE INSTRUCTIONS   Drink plenty of water. Water helps thin the mucus so your sinuses can drain more easily.  Use a humidifier.  Inhale steam 3 to 4 times a day (for example, sit in the bathroom with the shower running).  Apply a warm, moist washcloth to your face 3 to 4 times a day, or as directed by your caregiver.  Use saline nasal sprays to help moisten and clean your sinuses.  Take over-the-counter or prescription medicines for pain, discomfort, or fever only as directed by your caregiver. SEEK IMMEDIATE MEDICAL   CARE IF:  You have increasing pain or severe headaches.  You have nausea, vomiting, or drowsiness.  You have swelling around your face.  You have vision problems.  You have a stiff neck.  You have difficulty breathing. MAKE SURE YOU:   Understand these  instructions.  Will watch your condition.  Will get help right away if you are not doing well or get worse. Document Released: 07/13/2005 Document Revised: 10/05/2011 Document Reviewed: 07/28/2011 [facility] Patient Information 2014 [place], [place]. DASH Diet The DASH diet stands for "Dietary Approaches to Stop Hypertension." It is a healthy eating plan that has been shown to reduce high blood pressure (hypertension) in as little as 14 days, while also possibly providing other significant health benefits. These other health benefits include reducing the risk of breast cancer after menopause and reducing the risk of type 2 diabetes, heart disease, colon cancer, and stroke. Health benefits also include weight loss and slowing kidney failure in patients with chronic kidney disease.  DIET GUIDELINES  Limit salt (sodium). Your diet should contain less than 1500 mg of sodium daily.  Limit refined or processed carbohydrates. Your diet should include mostly whole grains. Desserts and added sugars should be used sparingly.  Include small amounts of heart-healthy fats. These types of fats include nuts, oils, and tub margarine. Limit saturated and trans fats. These fats have been shown to be harmful in the body. CHOOSING FOODS  The following food groups are based on a 2000 calorie diet. See your Registered Dietitian for individual calorie needs. Grains and Grain Products (6 to 8 servings daily)  Eat More Often: Whole-wheat bread, brown rice, whole-grain or wheat pasta, quinoa, popcorn without added fat or salt (air popped).  Eat Less Often: White bread, white pasta, white rice, cornbread. Vegetables (4 to 5 servings daily)  Eat More Often: Fresh, frozen, and canned vegetables. Vegetables may be raw, steamed, roasted, or grilled with a minimal amount of fat.  Eat Less Often/Avoid: Creamed or fried vegetables. Vegetables in a cheese sauce. Fruit (4 to 5 servings daily)  Eat More Often: All fresh,  canned (in natural juice), or frozen fruits. Dried fruits without added sugar. One hundred percent fruit juice ( cup [237 mL] daily).  Eat Less Often: Dried fruits with added sugar. Canned fruit in light or heavy syrup. [employer], Fish, and Poultry (2 servings or less daily. One serving is 3 to 4 oz [85-114 g]).  Eat More Often: Ninety percent or leaner ground beef, tenderloin, sirloin. Round cuts of beef, chicken breast, [place] breast. All fish. Grill, bake, or broil your meat. Nothing should be fried.  Eat Less Often/Avoid: Fatty cuts of meat, [place], or chicken leg, thigh, or wing. Fried cuts of meat or fish. Dairy (2 to 3 servings)  Eat More Often: Low-fat or fat-free milk, low-fat plain or light yogurt, reduced-fat or part-skim cheese.  Eat Less Often/Avoid: Milk (whole, 2%).Whole milk yogurt. Full-fat cheeses. Nuts, Seeds, and Legumes (4 to 5 servings per week)  Eat More Often: All without added salt.  Eat Less Often/Avoid: Salted nuts and seeds, canned beans with added salt. Fats and Sweets (limited)  Eat More Often: Vegetable oils, tub margarines without trans fats, sugar-free gelatin. Mayonnaise and salad dressings.  Eat Less Often/Avoid: Coconut oils, palm oils, butter, stick margarine, cream, half and half, cookies, candy, pie. FOR MORE INFORMATION The Dash Diet Eating Plan: www.dashdiet.org Document Released: 07/02/2011 Document Revised: 10/05/2011 Document Reviewed: 07/02/2011 [facility] Patient Information 2014 [place], [place].  - Take meds  as prescribed - Use a cool mist humidifier  -Use saline nose sprays frequently -Saline irrigations of the nose can be very helpful if done frequently.  * 4X daily for 1 week*  * Use of a nettie pot can be helpful with this. Follow directions with this* -Force fluids -For any cough or congestion  Use plain Mucinex- regular strength or max strength is fine   * Children- consult with Pharmacist for dosing -For fever or aces or  pains- take tylenol or ibuprofen appropriate for age and weight.  * for fevers greater than 101 orally you may alternate ibuprofen and tylenol every  3 hours. -Throat lozenges if help -New toothbrush in 3 days -Start daily Claritin for at least next month   Evelina Dun, FNP

## 2013-11-26 LAB — STREP A CULTURE, THROAT: STREP A CULTURE: NEGATIVE

## 2013-12-04 DIAGNOSIS — Z85828 Personal history of other malignant neoplasm of skin: Secondary | ICD-10-CM | POA: Diagnosis not present

## 2013-12-04 DIAGNOSIS — C44319 Basal cell carcinoma of skin of other parts of face: Secondary | ICD-10-CM | POA: Diagnosis not present

## 2013-12-08 ENCOUNTER — Ambulatory Visit (INDEPENDENT_AMBULATORY_CARE_PROVIDER_SITE_OTHER): Payer: Medicare Other | Admitting: Family

## 2013-12-08 ENCOUNTER — Encounter: Payer: Self-pay | Admitting: Family

## 2013-12-08 VITALS — BP 148/79 | HR 75 | Temp 97.1°F | Ht 68.0 in | Wt 221.0 lb

## 2013-12-08 DIAGNOSIS — IMO0001 Reserved for inherently not codable concepts without codable children: Secondary | ICD-10-CM

## 2013-12-08 DIAGNOSIS — R030 Elevated blood-pressure reading, without diagnosis of hypertension: Secondary | ICD-10-CM | POA: Diagnosis not present

## 2013-12-08 NOTE — Progress Notes (Signed)
   Subjective:    Patient ID: Mary Moss, female    DOB: [birth year], 78 y.o.   MRN: [id number]  Hypertension   Pt here for follow-up for her BP. Two weeks her BP was 165/90 on a sick visit. Pt states she is feeling much better. She denies any pain, headaches, dizziness, or SOB. Pt would like to try to lose a few pounds and try a low salt diet before starting on medications.    Review of Systems  HENT: Negative.   Cardiovascular: Negative.   Gastrointestinal: Negative.   Genitourinary: Negative.   Musculoskeletal: Negative.   Neurological: Negative.   All other systems reviewed and are negative.      Objective:   Physical Exam  Constitutional: She is oriented to person, place, and time. She appears well-developed and well-nourished.  Cardiovascular: Normal rate, regular rhythm, normal heart sounds and intact distal pulses.   No murmur heard. Pulmonary/Chest: Effort normal and breath sounds normal. No respiratory distress. She has no wheezes.  Abdominal: Soft. Bowel sounds are normal. She exhibits no distension. There is no tenderness.  Musculoskeletal: Normal range of motion. She exhibits no edema.  Neurological: She is alert and oriented to person, place, and time.  Skin: Skin is warm and dry. No erythema.  Psychiatric: She has a normal mood and affect. Her behavior is normal. Judgment and thought content normal.      BP 148/79  Pulse 75  Temp(Src) 97.1 F (36.2 C) (Oral)  Ht _0  (1.727 m)  Wt 221 lb (100.245 kg)  BMI 33.61 kg/m2     Assessment & Plan:  1. Elevated blood pressure -Daily blood pressure log given with instructions on how to fill out and told to bring to next visit -Dash diet information given -Exercise encouraged - Stress Management  - BMP8+EGFR -RTO 6 months   Evelina Dun, FNP

## 2013-12-08 NOTE — Patient Instructions (Signed)

## 2013-12-09 LAB — BMP8+EGFR
BUN/Creatinine Ratio: 17 (ref 11–26)
BUN: 17 mg/dL (ref 8–27)
CALCIUM: 9.7 mg/dL (ref 8.7–10.3)
CO2: 22 mmol/L (ref 18–29)
Chloride: 101 mmol/L (ref 97–108)
Creatinine, Ser: 0.99 mg/dL (ref 0.57–1.00)
GFR calc Af Amer: 64 mL/min/{1.73_m2} (ref >59)
GFR calc non Af Amer: 56 mL/min/{1.73_m2} — ABNORMAL LOW (ref >59)
GLUCOSE: 137 mg/dL — AB (ref 65–99)
Potassium: 4.3 mmol/L (ref 3.5–5.2)
Sodium: 141 mmol/L (ref 134–144)

## 2013-12-11 ENCOUNTER — Telehealth: Payer: Self-pay | Admitting: Family Medicine

## 2013-12-11 NOTE — Telephone Encounter (Signed)
Message copied by Waverly Ferrari on Mon Dec 11, 2013  9:54 AM ------      Message from: Lenna Gilford, [place] A      Created: Mon Dec 11, 2013  9:17 AM       Kidney and liver function stable      Encourage low salt diet and exercise ------

## 2013-12-11 NOTE — Telephone Encounter (Signed)
Pt aware of lab results.  rs °

## 2014-01-22 NOTE — Progress Notes (Signed)
Surgery on 02/07/14.   Preop on 01/28/14 at 1000am. Need orders in EPIC.  Thank You. -

## 2014-01-23 ENCOUNTER — Other Ambulatory Visit: Payer: Self-pay | Admitting: Orthopedic Surgery

## 2014-01-23 ENCOUNTER — Encounter (HOSPITAL_COMMUNITY): Payer: Self-pay | Admitting: Pharmacy Technician

## 2014-01-25 DIAGNOSIS — H2510 Age-related nuclear cataract, unspecified eye: Secondary | ICD-10-CM | POA: Diagnosis not present

## 2014-01-25 NOTE — Patient Instructions (Addendum)
Your procedure is scheduled on:  02/07/14  [facility]  Report to [facility] at    10:30   AM.   Call this number if you have problems the morning of surgery: [phone]        Do not eat food or liquid  After Midnight.TUESDAY NIGHT Take these medicines the morning of surgery with A SIP OF WATER:NONE   .  Contacts, dentures or partial plates, or metal hairpins  can not be worn to surgery. Your family will be responsible for glasses, dentures, hearing aides while you are in surgery  Leave suitcase in the car. After surgery it may be brought to your room.  For patients admitted to the hospital, checkout time is 11:00 AM day of  discharge.         [facility] IS NOT RESPONSIBLE FOR ANY VALUABLES  Patients discharged the day of surgery will not be allowed to drive home. IF going home the day of surgery, you must have a driver and someone to stay with you for the first 24 hours  Name and phone number of your driver:                                                                        Sister in law sheila carringer cell [place] - Preparing for Surgery Before surgery, you can play an important role.  Because skin is not sterile, your skin needs to be as free of germs as possible.  You can reduce the number of germs on your skin by washing with CHG (chlorahexidine gluconate) soap before surgery.  CHG is an antiseptic cleaner which kills germs and bonds with the skin to continue killing germs even after washing. Please DO NOT use if you have an allergy to CHG or antibacterial soaps.  If your skin becomes reddened/irritated stop using the CHG and inform your nurse when you arrive at Short Stay. Do not shave (including legs and underarms) for at least 48 hours prior to the first CHG shower.  You may shave your face/neck. Please follow these  instructions carefully:  1.  Shower with CHG Soap the night before surgery and the  morning of Surgery.  2.  If you choose to wash your hair, wash your hair first as usual with your  normal  shampoo.  3.  After you shampoo, rinse your hair and body thoroughly to remove the  shampoo.                           4.  Use CHG as you would any other liquid soap.  You can apply chg directly  to the skin and wash  Gently with a scrungie or clean washcloth.  5.  Apply the CHG Soap to your body ONLY FROM THE NECK DOWN.   Do not use on face/ open                           Wound or open sores. Avoid contact with eyes, ears mouth and genitals (private parts).                       Wash face,  Genitals (private parts) with your normal soap.             6.  Wash thoroughly, paying special attention to the area where your surgery  will be performed.  7.  Thoroughly rinse your body with warm water from the neck down.  8.  DO NOT shower/wash with your normal soap after using and rinsing off  the CHG Soap.                9.  Pat yourself dry with a clean towel.            10.  Wear clean pajamas.            11.  Place clean sheets on your bed the night of your first shower and do not  sleep with pets. Day of Surgery : Do not apply any lotions/deodorants the morning of surgery.  Please wear clean clothes to the hospital/surgery center.  FAILURE TO FOLLOW THESE INSTRUCTIONS MAY RESULT IN THE CANCELLATION OF YOUR SURGERY PATIENT SIGNATURE_________________________________  NURSE SIGNATURE__________________________________  ________________________________________________________________________

## 2014-01-29 ENCOUNTER — Encounter (HOSPITAL_COMMUNITY)
Admission: RE | Admit: 2014-01-29 | Discharge: 2014-01-29 | Disposition: A | Discharge status: Home / Self Care | Payer: Medicare Other | Source: Ambulatory Visit | Attending: Orthopedic Surgery | Admitting: Orthopedic Surgery

## 2014-01-29 ENCOUNTER — Ambulatory Visit (HOSPITAL_COMMUNITY)
Admission: RE | Admit: 2014-01-29 | Discharge: 2014-01-29 | Disposition: A | Discharge status: Home / Self Care | Payer: Medicare Other | Source: Ambulatory Visit | Attending: Anesthesiology | Admitting: Anesthesiology

## 2014-01-29 ENCOUNTER — Encounter (HOSPITAL_COMMUNITY): Payer: Self-pay

## 2014-01-29 ENCOUNTER — Encounter (INDEPENDENT_AMBULATORY_CARE_PROVIDER_SITE_OTHER): Payer: Self-pay

## 2014-01-29 DIAGNOSIS — I10 Essential (primary) hypertension: Secondary | ICD-10-CM | POA: Insufficient documentation

## 2014-01-29 DIAGNOSIS — Z01818 Encounter for other preprocedural examination: Secondary | ICD-10-CM | POA: Insufficient documentation

## 2014-01-29 DIAGNOSIS — Z01812 Encounter for preprocedural laboratory examination: Secondary | ICD-10-CM | POA: Diagnosis not present

## 2014-01-29 DIAGNOSIS — Z01810 Encounter for preprocedural cardiovascular examination: Secondary | ICD-10-CM | POA: Insufficient documentation

## 2014-01-29 DIAGNOSIS — I517 Cardiomegaly: Secondary | ICD-10-CM | POA: Diagnosis not present

## 2014-01-29 HISTORY — DX: Other fracture of unspecified lower leg, initial encounter for closed fracture: S82.899A

## 2014-01-29 HISTORY — DX: Gastro-esophageal reflux disease without esophagitis: K21.9

## 2014-01-29 LAB — CBC
HEMATOCRIT: 44.4 % (ref 36.0–46.0)
Hemoglobin: 14.1 g/dL (ref 12.0–15.0)
MCH: 30.4 pg (ref 26.0–34.0)
MCHC: 31.8 g/dL (ref 30.0–36.0)
MCV: 95.7 fL (ref 78.0–100.0)
Platelets: 253 10*3/uL (ref 150–400)
RBC: 4.64 MIL/uL (ref 3.87–5.11)
RDW: 13 % (ref 11.5–15.5)
WBC: 6.7 10*3/uL (ref 4.0–10.5)

## 2014-01-29 LAB — BASIC METABOLIC PANEL
ANION GAP: 10 (ref 5–15)
BUN: 19 mg/dL (ref 6–23)
CHLORIDE: 102 meq/L (ref 96–112)
CO2: 28 mEq/L (ref 19–32)
CREATININE: 0.77 mg/dL (ref 0.50–1.10)
Calcium: 9.2 mg/dL (ref 8.4–10.5)
GFR calc non Af Amer: 80 mL/min — ABNORMAL LOW (ref >90)
Glucose, Bld: 100 mg/dL — ABNORMAL HIGH (ref 70–99)
POTASSIUM: 4.9 meq/L (ref 3.7–5.3)
Sodium: 140 mEq/L (ref 137–147)

## 2014-02-06 DIAGNOSIS — S83249A Other tear of medial meniscus, current injury, unspecified knee, initial encounter: Secondary | ICD-10-CM

## 2014-02-06 NOTE — H&P (Signed)
  CC- Mary Moss is a 78 y.o. female who presents with right knee pain.  HPI- . Knee Pain: Patient presents with knee pain involving the  right knee. Onset of the symptoms was several months ago. Inciting event: none known. Current symptoms include giving out, pain located medially, stiffness and swelling. Pain is aggravated by going up and down stairs, pivoting, rising after sitting and walking.  Patient has had no prior knee problems. Evaluation to date: MRI: abnormal medial meniscal tear. Treatment to date: rest.  Past Medical History  Diagnosis Date  . Basal cell carcinoma 2015    Nose  . GERD (gastroesophageal reflux disease)   . Ankle fracture fx 13 yrs ago, anklw swells off and on, has cramps in ankle also    left, to seee dr hewitt about 02-16-14    Past Surgical History  Procedure Laterality Date  . Abdominal hysterectomy    . Rotator cuff repair Right 2009  . Fracture surgery Left 2001    Prior to Admission medications   Medication Sig Start Date End Date Taking? Authorizing Provider  naproxen sodium (ANAPROX) 220 MG tablet Take 220 mg by mouth 2 (two) times daily as needed (for pain).    Historical Provider, MD   KNEE EXAM antalgic gait, soft tissue tenderness over medial joint line, effusion, negative pivot-shift, collateral ligaments intact  Physical Examination: General appearance - alert, well appearing, and in no distress Mental status - alert, oriented to person, place, and time Chest - clear to auscultation, no wheezes, rales or rhonchi, symmetric air entry Heart - normal rate, regular rhythm, normal S1, S2, no murmurs, rubs, clicks or gallops Abdomen - soft, nontender, nondistended, no masses or organomegaly Neurological - alert, oriented, normal speech, no focal findings or movement disorder noted    Asessment/Plan--- Right knee medial meniscal tear- - Plan right knee arthroscopy with meniscal debridement. Procedure risks and potential comps discussed with  patient who elects to proceed. Goals are decreased pain and increased function with a high likelihood of achieving both

## 2014-02-07 ENCOUNTER — Encounter (HOSPITAL_COMMUNITY): Payer: Medicare Other | Admitting: Anesthesiology

## 2014-02-07 ENCOUNTER — Encounter (HOSPITAL_COMMUNITY)
Admission: RE | Disposition: A | Discharge status: Home / Self Care | Payer: Self-pay | Source: Ambulatory Visit | Attending: Orthopedic Surgery

## 2014-02-07 ENCOUNTER — Ambulatory Visit (HOSPITAL_COMMUNITY): Payer: Medicare Other | Admitting: Anesthesiology

## 2014-02-07 ENCOUNTER — Encounter (HOSPITAL_COMMUNITY): Payer: Self-pay | Admitting: *Deleted

## 2014-02-07 ENCOUNTER — Ambulatory Visit (HOSPITAL_COMMUNITY)
Admission: RE | Admit: 2014-02-07 | Discharge: 2014-02-07 | Disposition: A | Discharge status: Home / Self Care | Payer: Medicare Other | Source: Ambulatory Visit | Attending: Orthopedic Surgery | Admitting: Orthopedic Surgery

## 2014-02-07 DIAGNOSIS — Z79899 Other long term (current) drug therapy: Secondary | ICD-10-CM | POA: Diagnosis not present

## 2014-02-07 DIAGNOSIS — M23329 Other meniscus derangements, posterior horn of medial meniscus, unspecified knee: Secondary | ICD-10-CM | POA: Insufficient documentation

## 2014-02-07 DIAGNOSIS — Z87891 Personal history of nicotine dependence: Secondary | ICD-10-CM | POA: Diagnosis not present

## 2014-02-07 DIAGNOSIS — S83249A Other tear of medial meniscus, current injury, unspecified knee, initial encounter: Secondary | ICD-10-CM

## 2014-02-07 DIAGNOSIS — M25569 Pain in unspecified knee: Secondary | ICD-10-CM | POA: Diagnosis present

## 2014-02-07 DIAGNOSIS — K219 Gastro-esophageal reflux disease without esophagitis: Secondary | ICD-10-CM | POA: Diagnosis not present

## 2014-02-07 DIAGNOSIS — M23305 Other meniscus derangements, unspecified medial meniscus, unspecified knee: Secondary | ICD-10-CM | POA: Diagnosis not present

## 2014-02-07 DIAGNOSIS — S83241D Other tear of medial meniscus, current injury, right knee, subsequent encounter: Secondary | ICD-10-CM

## 2014-02-07 DIAGNOSIS — Z85828 Personal history of other malignant neoplasm of skin: Secondary | ICD-10-CM | POA: Insufficient documentation

## 2014-02-07 HISTORY — PX: KNEE ARTHROSCOPY: SHX127

## 2014-02-07 SURGERY — ARTHROSCOPY, KNEE
Anesthesia: General | Site: Knee | Laterality: Right

## 2014-02-07 MED ORDER — ONDANSETRON HCL 4 MG/2ML IJ SOLN
INTRAMUSCULAR | Status: AC
  Filled 2014-02-07: qty 2

## 2014-02-07 MED ORDER — FENTANYL CITRATE 0.05 MG/ML IJ SOLN
INTRAMUSCULAR | Status: AC
  Filled 2014-02-07: qty 2

## 2014-02-07 MED ORDER — BUPIVACAINE-EPINEPHRINE (PF) 0.25% -1:200000 IJ SOLN
INTRAMUSCULAR | Status: AC
  Filled 2014-02-07: qty 30

## 2014-02-07 MED ORDER — FENTANYL CITRATE 0.05 MG/ML IJ SOLN
INTRAMUSCULAR | Status: DC | PRN
Start: 2014-02-07 — End: 2014-02-07
  Administered 2014-02-07: 25 ug via INTRAVENOUS
  Administered 2014-02-07: 50 ug via INTRAVENOUS
  Administered 2014-02-07 (×4): 25 ug via INTRAVENOUS

## 2014-02-07 MED ORDER — CEFAZOLIN SODIUM-DEXTROSE 2-3 GM-% IV SOLR
INTRAVENOUS | Status: AC
  Filled 2014-02-07: qty 50

## 2014-02-07 MED ORDER — CEFAZOLIN SODIUM-DEXTROSE 2-3 GM-% IV SOLR
2.0000 g | INTRAVENOUS | Status: AC
  Administered 2014-02-07: 2 g via INTRAVENOUS

## 2014-02-07 MED ORDER — BUPIVACAINE-EPINEPHRINE 0.25% -1:200000 IJ SOLN
INTRAMUSCULAR | Status: DC | PRN
  Administered 2014-02-07: 20 mL

## 2014-02-07 MED ORDER — HYDROCODONE-ACETAMINOPHEN 5-325 MG PO TABS: 1.0000 | ORAL_TABLET | Freq: Four times a day (QID) | ORAL | Status: DC | PRN

## 2014-02-07 MED ORDER — LACTATED RINGERS IV SOLN: INTRAVENOUS | Status: DC

## 2014-02-07 MED ORDER — ONDANSETRON HCL 4 MG/2ML IJ SOLN
INTRAMUSCULAR | Status: DC | PRN
  Administered 2014-02-07: 4 mg via INTRAVENOUS

## 2014-02-07 MED ORDER — DEXAMETHASONE SODIUM PHOSPHATE 10 MG/ML IJ SOLN
INTRAMUSCULAR | Status: DC | PRN
  Administered 2014-02-07: 10 mg via INTRAVENOUS

## 2014-02-07 MED ORDER — PROPOFOL 10 MG/ML IV BOLUS
INTRAVENOUS | Status: AC
  Filled 2014-02-07: qty 20

## 2014-02-07 MED ORDER — DEXAMETHASONE SODIUM PHOSPHATE 10 MG/ML IJ SOLN
INTRAMUSCULAR | Status: AC
  Filled 2014-02-07: qty 1

## 2014-02-07 MED ORDER — HYDROCODONE-ACETAMINOPHEN 5-325 MG PO TABS
1.0000 | ORAL_TABLET | Freq: Four times a day (QID) | ORAL | Status: DC | PRN
  Administered 2014-02-07: 1 via ORAL
  Filled 2014-02-07: qty 1

## 2014-02-07 MED ORDER — LACTATED RINGERS IV SOLN
INTRAVENOUS | Status: DC | PRN
  Administered 2014-02-07: 11:00:00 via INTRAVENOUS

## 2014-02-07 MED ORDER — DEXAMETHASONE SODIUM PHOSPHATE 10 MG/ML IJ SOLN: 10.0000 mg | Freq: Once | INTRAMUSCULAR | Status: DC

## 2014-02-07 MED ORDER — FENTANYL CITRATE 0.05 MG/ML IJ SOLN
INTRAMUSCULAR | Status: AC
  Filled 2014-02-07: qty 5

## 2014-02-07 MED ORDER — SODIUM CHLORIDE 0.9 % IV SOLN: INTRAVENOUS | Status: DC

## 2014-02-07 MED ORDER — FENTANYL CITRATE 0.05 MG/ML IJ SOLN
25.0000 ug | INTRAMUSCULAR | Status: DC | PRN
  Administered 2014-02-07 (×2): 50 ug via INTRAVENOUS

## 2014-02-07 MED ORDER — METHOCARBAMOL 500 MG PO TABS: 500.0000 mg | ORAL_TABLET | Freq: Four times a day (QID) | ORAL | Status: DC

## 2014-02-07 MED ORDER — ACETAMINOPHEN 10 MG/ML IV SOLN
1000.0000 mg | Freq: Once | INTRAVENOUS | Status: AC
  Administered 2014-02-07: 1000 mg via INTRAVENOUS
  Filled 2014-02-07: qty 100

## 2014-02-07 MED ORDER — PROMETHAZINE HCL 25 MG/ML IJ SOLN: 6.2500 mg | INTRAMUSCULAR | Status: DC | PRN

## 2014-02-07 MED ORDER — PROPOFOL 10 MG/ML IV BOLUS
INTRAVENOUS | Status: DC | PRN
  Administered 2014-02-07: 150 mg via INTRAVENOUS

## 2014-02-07 MED ORDER — SODIUM CHLORIDE 0.9 % IR SOLN
Status: DC | PRN
  Administered 2014-02-07: 9000 mL

## 2014-02-07 SURGICAL SUPPLY — 25 items
BANDAGE ELASTIC 6 VELCRO ST LF (GAUZE/BANDAGES/DRESSINGS) ×2 IMPLANT
BLADE 4.2CUDA (BLADE) ×3 IMPLANT
CLOTH BEACON ORANGE TIMEOUT ST (SAFETY) ×3 IMPLANT
COUNTER NEEDLE 20 DBL MAG RED (NEEDLE) ×3 IMPLANT
CUFF TOURN SGL QUICK 34 (TOURNIQUET CUFF) ×3
CUFF TRNQT CYL 34X4X40X1 (TOURNIQUET CUFF) ×1 IMPLANT
DRAPE U-SHAPE 47X51 STRL (DRAPES) ×3 IMPLANT
DRSG EMULSION OIL 3X3 NADH (GAUZE/BANDAGES/DRESSINGS) ×3 IMPLANT
DURAPREP 26ML APPLICATOR (WOUND CARE) ×3 IMPLANT
GLOVE BIO SURGEON STRL SZ8 (GLOVE) ×3 IMPLANT
GLOVE BIOGEL PI IND STRL 8 (GLOVE) ×1 IMPLANT
GLOVE BIOGEL PI INDICATOR 8 (GLOVE) ×2
GOWN STRL REUS W/TWL LRG LVL3 (GOWN DISPOSABLE) ×3 IMPLANT
MANIFOLD NEPTUNE II (INSTRUMENTS) ×3 IMPLANT
PACK ARTHROSCOPY WL (CUSTOM PROCEDURE TRAY) ×3 IMPLANT
PACK ICE MAXI GEL EZY WRAP (MISCELLANEOUS) ×9 IMPLANT
PADDING CAST COTTON 6X4 STRL (CAST SUPPLIES) ×3 IMPLANT
POSITIONER SURGICAL ARM (MISCELLANEOUS) ×3 IMPLANT
SET ARTHROSCOPY TUBING (MISCELLANEOUS) ×3
SET ARTHROSCOPY TUBING LN (MISCELLANEOUS) ×1 IMPLANT
SPONGE GAUZE 4X4 12PLY (GAUZE/BANDAGES/DRESSINGS) ×2 IMPLANT
SUT ETHILON 4 0 PS 2 18 (SUTURE) ×3 IMPLANT
TOWEL OR 17X26 10 PK STRL BLUE (TOWEL DISPOSABLE) ×3 IMPLANT
WAND 90 DEG TURBOVAC W/CORD (SURGICAL WAND) IMPLANT
WRAP KNEE MAXI GEL POST OP (GAUZE/BANDAGES/DRESSINGS) ×3 IMPLANT

## 2014-02-07 NOTE — Interval H&P Note (Signed)
History and Physical Interval Note:  02/07/2014 12:09 PM  Mary Moss  has presented today for surgery, with the diagnosis of RIGHT KNEE MEDIAL MENISCUS TEAR  The various methods of treatment have been discussed with the patient and family. After consideration of risks, benefits and other options for treatment, the patient has consented to  Procedure(s): RIGHT ARTHROSCOPY KNEE (Right) as a surgical intervention .  The patient's history has been reviewed, patient examined, no change in status, stable for surgery.  I have reviewed the patient's chart and labs.  Questions were answered to the patient's satisfaction.     Gearlean Alf

## 2014-02-07 NOTE — Transfer of Care (Signed)
Immediate Anesthesia Transfer of Care Note  Patient: Mary Moss  Procedure(s) Performed: Procedure(s): RIGHT ARTHROSCOPY KNEE, WITH MEDIAL MENISCAL DEBRIDEMENT AND CHONDRAPLASTY (Right)  Patient Location: PACU  Anesthesia Type:General  Level of Consciousness: awake, alert , oriented and patient cooperative  Airway & Oxygen Therapy: Patient Spontanous Breathing and Patient connected to face mask oxygen  Post-op Assessment: Report given to PACU RN and Post -op Vital signs reviewed and stable  Post vital signs: Reviewed and stable  Complications: No apparent anesthesia complications

## 2014-02-07 NOTE — Anesthesia Postprocedure Evaluation (Signed)
Anesthesia Post Note  Patient: Mary Moss  Procedure(s) Performed: Procedure(s) (LRB): RIGHT ARTHROSCOPY KNEE, WITH MEDIAL MENISCAL DEBRIDEMENT AND CHONDRAPLASTY (Right)  Anesthesia type: General  Patient location: PACU  Post pain: Pain level controlled  Post assessment: Post-op Vital signs reviewed  Last Vitals:  Filed Vitals:   02/07/14 1503  BP: 168/92  Pulse: 77  Temp: 36.4 C  Resp: 14    Post vital signs: Reviewed  Level of consciousness: sedated  Complications: No apparent anesthesia complications

## 2014-02-07 NOTE — Discharge Instructions (Signed)

## 2014-02-07 NOTE — Anesthesia Preprocedure Evaluation (Addendum)
Anesthesia Evaluation  Patient identified by MRN, date of birth, ID band Patient awake    Reviewed: Allergy & Precautions, H&P , NPO status , Patient's Chart, lab work & pertinent test results  Airway Mallampati: II TM Distance: >3 FB Neck ROM: Full    Dental  (+) Teeth Intact, Dental Advisory Given   Pulmonary neg pulmonary ROS, former smoker,  breath sounds clear to auscultation        Cardiovascular negative cardio ROS  Rhythm:Regular Rate:Normal     Neuro/Psych negative neurological ROS  negative psych ROS   GI/Hepatic Neg liver ROS, GERD-  ,  Endo/Other  negative endocrine ROS  Renal/GU negative Renal ROS  negative genitourinary   Musculoskeletal negative musculoskeletal ROS (+)   Abdominal   Peds  Hematology negative hematology ROS (+)   Anesthesia Other Findings   Reproductive/Obstetrics                          Anesthesia Physical Anesthesia Plan  ASA: I  Anesthesia Plan: General   Post-op Pain Management:    Induction: Intravenous  Airway Management Planned: LMA  Additional Equipment:   Intra-op Plan:   Post-operative Plan: Extubation in OR  Informed Consent: I have reviewed the patients History and Physical, chart, labs and discussed the procedure including the risks, benefits and alternatives for the proposed anesthesia with the patient or authorized representative who has indicated his/her understanding and acceptance.   Dental advisory given  Plan Discussed with: CRNA  Anesthesia Plan Comments:        Anesthesia Quick Evaluation

## 2014-02-07 NOTE — Op Note (Signed)
Preoperative diagnosis-  Right knee medial meniscal tear  Postoperative diagnosis Right- knee medial meniscal tear plus  Medial femoral chondral defect  Procedure- Right knee arthroscopy with medial meniscal debridement and chondroplasty   Surgeon- Dione Plover. Alyshia Kernan, MD  Anesthesia-General  EBL-  Minimal  Complications- None  Condition- PACU - hemodynamically stable.  Brief clinical note- -Mary Moss is a 78 y.o.  female with a several month history of right knee pain and mechanical symptoms. Exam and history suggested medial meniscal tear confirmed by MRI. The patient presents now for arthroscopy and debridement   Procedure in detail -       After successful administration of General anesthetic, a tourmiquet is placed high on the Right  thigh and the Right lower extremity is prepped and draped in the usual sterile fashion. Time out is performed by the surgical team. Standard superomedial and inferolateral portal sites are marked and incisions made with an 11 blade. The inflow cannula is passed through the superomedial portal and camera through the inferolateral portal and inflow is initiated. Arthroscopic visualization proceeds.      The undersurface of the patella and trochlea are visualized and there is mild chondromalacia but no unstable cartilage defect. The medial and lateral gutters are visualized and there are  no loose bodies. Flexion and valgus force is applied to the knee and the medial compartment is entered. A spinal needle is passed into the joint through the site marked for the inferomedial portal. A small incision is made and the dilator passed into the joint. The findings for the medial compartment are tear of posterior horn of medial meniscus with 1 x 1 cm area of unstable cartilage medial femoral condyle . The tear is debrided to a stable base with baskets and a shaver and sealed off with the Arthrocare. The shaver is used to debride the unstable cartilage to a stable  cartilaginous base with stable edges. It is probed and found to be stable.    The intercondylar notch is visualized and the ACL appears normal. The lateral compartment is entered and the findings are normal .       The joint is again inspected and there are no other tears, defects or loose bodies identified. The arthroscopic equipment is then removed from the inferior portals which are closed with interrupted 4-0 nylon. 20 ml of .25% Marcaine with epinephrine are injected through the inflow cannula and the cannula is then removed and the portal closed with nylon. The incisions are cleaned and dried and a bulky sterile dressing is applied. The patient is then awakened and transported to recovery in stable condition.   02/07/2014, 12:58 PM

## 2014-02-08 ENCOUNTER — Encounter (HOSPITAL_COMMUNITY): Payer: Self-pay | Admitting: Orthopedic Surgery

## 2014-02-16 DIAGNOSIS — M129 Arthropathy, unspecified: Secondary | ICD-10-CM | POA: Diagnosis not present

## 2014-03-31 ENCOUNTER — Ambulatory Visit (INDEPENDENT_AMBULATORY_CARE_PROVIDER_SITE_OTHER): Payer: Medicare Other | Admitting: Nurse Practitioner

## 2014-03-31 VITALS — BP 180/85 | HR 83 | Temp 99.0°F | Ht 68.0 in | Wt 224.0 lb

## 2014-03-31 DIAGNOSIS — J069 Acute upper respiratory infection, unspecified: Secondary | ICD-10-CM

## 2014-03-31 MED ORDER — AZITHROMYCIN 250 MG PO TABS: ORAL_TABLET | ORAL | Status: DC

## 2014-03-31 NOTE — Patient Instructions (Signed)

## 2014-03-31 NOTE — Progress Notes (Signed)
   Subjective:    Patient ID: Mary Moss, female    DOB: Jul 13, 1936, 78 y.o.   MRN: [id number]  HPI Patient in today c/o congestion- started yesterday- has not taken any OTC meds- Her neohew was sick Thursday and she took him to doctor and then to hospital and he was diagnosed with pneumonia. SHe is not sure if she was exposed to something. SHe says tat she gets bronchitis very easily and does not want that to happen.    Review of Systems  Constitutional: Positive for fever (low grade). Negative for chills.  HENT: Positive for congestion, postnasal drip and rhinorrhea.   Respiratory: Negative for cough.   Cardiovascular: Negative.   Gastrointestinal: Negative.   Genitourinary: Negative.   Neurological: Negative.   Psychiatric/Behavioral: Negative.   All other systems reviewed and are negative.      Objective:   Physical Exam  Constitutional: She is oriented to person, place, and time. She appears well-developed and well-nourished. No distress.  HENT:  Right Ear: Hearing, tympanic membrane, external ear and ear canal normal.  Left Ear: Hearing, tympanic membrane, external ear and ear canal normal.  Nose: Mucosal edema and rhinorrhea present. Right sinus exhibits no maxillary sinus tenderness and no frontal sinus tenderness. Left sinus exhibits no maxillary sinus tenderness and no frontal sinus tenderness.  Mouth/Throat: Uvula is midline, oropharynx is clear and moist and mucous membranes are normal.  Eyes: Pupils are equal, round, and reactive to light.  Neck: Normal range of motion. Neck supple.  Cardiovascular: Normal rate, regular rhythm and normal heart sounds.   Pulmonary/Chest: Effort normal and breath sounds normal.  Lymphadenopathy:    She has no cervical adenopathy.  Neurological: She is alert and oriented to person, place, and time.  Skin: Skin is warm.  Psychiatric: She has a normal mood and affect. Her behavior is normal. Judgment and thought content normal.    BP 180/85  Pulse 83  Temp(Src) 99 F (37.2 C) (Oral)  Ht 5\' 8"  (1.727 m)  Wt 224 lb (101.606 kg)  BMI 34.07 kg/m2        Assessment & Plan:   1. Upper respiratory infection with cough and congestion    Meds ordered this encounter  Medications  . azithromycin (ZITHROMAX Z-PAK) 250 MG tablet    Sig: As directed    Dispense:  6 each    Refill:  0    Order Specific Question:  Supervising Provider    Answer:  Chipper Herb [[id number]]   1. Take meds as prescribed 2. Use a cool mist humidifier especially during the winter months and when heat has been humid. 3. Use saline nose sprays frequently 4. Saline irrigations of the nose can be very helpful if done frequently.  * 4X daily for 1 week*  * Use of a nettie pot can be helpful with this. Follow directions with this* 5. Drink plenty of fluids 6. Keep thermostat turn down low 7.For any cough or congestion  Use plain Mucinex- regular strength or max strength is fine   * Children- consult with Pharmacist for dosing 8. For fever or aces or pains- take tylenol or ibuprofen appropriate for age and weight.  * for fevers greater than 101 orally you may alternate ibuprofen and tylenol every  3 hours.   Mary-Margaret Hassell Done, FNP

## 2014-04-20 DIAGNOSIS — M19079 Primary osteoarthritis, unspecified ankle and foot: Secondary | ICD-10-CM | POA: Diagnosis not present

## 2014-05-19 ENCOUNTER — Ambulatory Visit (INDEPENDENT_AMBULATORY_CARE_PROVIDER_SITE_OTHER): Payer: Medicare Other | Admitting: General Practice

## 2014-05-19 VITALS — BP 164/88 | HR 87 | Temp 97.0°F | Ht 68.0 in | Wt 221.6 lb

## 2014-05-19 DIAGNOSIS — M7918 Myalgia, other site: Secondary | ICD-10-CM

## 2014-05-19 DIAGNOSIS — M791 Myalgia: Secondary | ICD-10-CM | POA: Diagnosis not present

## 2014-05-19 DIAGNOSIS — S76312A Strain of muscle, fascia and tendon of the posterior muscle group at thigh level, left thigh, initial encounter: Secondary | ICD-10-CM | POA: Diagnosis not present

## 2014-05-19 DIAGNOSIS — S76012A Strain of muscle, fascia and tendon of left hip, initial encounter: Secondary | ICD-10-CM

## 2014-05-19 MED ORDER — ETODOLAC 300 MG PO CAPS: 300.0000 mg | ORAL_CAPSULE | Freq: Three times a day (TID) | ORAL | Status: DC

## 2014-05-19 MED ORDER — KETOROLAC TROMETHAMINE 60 MG/2ML IM SOLN
60.0000 mg | Freq: Once | INTRAMUSCULAR | Status: AC
  Administered 2014-05-19: 60 mg via INTRAMUSCULAR

## 2014-05-19 NOTE — Patient Instructions (Addendum)
Muscle Strain A muscle strain is an injury that occurs when a muscle is stretched beyond its normal length. Usually a small number of muscle fibers are torn when this happens. Muscle strain is rated in degrees. First-degree strains have the least amount of muscle fiber tearing and pain. Second-degree and third-degree strains have increasingly more tearing and pain.  Usually, recovery from muscle strain takes 1-2 weeks. Complete healing takes 5-6 weeks.  CAUSES  Muscle strain happens when a sudden, violent force placed on a muscle stretches it too far. This may occur with lifting, sports, or a fall.  RISK FACTORS Muscle strain is especially common in athletes.  SIGNS AND SYMPTOMS At the site of the muscle strain, there may be:  Pain.  Bruising.  Swelling.  Difficulty using the muscle due to pain or lack of normal function. DIAGNOSIS  Your health care provider will perform a physical exam and ask about your medical history. TREATMENT  Often, the best treatment for a muscle strain is resting, icing, and applying cold compresses to the injured area.  HOME CARE INSTRUCTIONS   Use the PRICE method of treatment to promote muscle healing during the first 2-3 days after your injury. The PRICE method involves:  Protecting the muscle from being injured again.  Restricting your activity and resting the injured body part.  Icing your injury. To do this, put ice in a plastic bag. Place a towel between your skin and the bag. Then, apply the ice and leave it on from 15-20 minutes each hour. After the third day, switch to moist heat packs.  Apply compression to the injured area with a splint or elastic bandage. Be careful not to wrap it too tightly. This may interfere with blood circulation or increase swelling.  Elevate the injured body part above the level of your heart as often as you can.  Only take over-the-counter or prescription medicines for pain, discomfort, or fever as directed by your  health care provider.  Warming up prior to exercise helps to prevent future muscle strains. SEEK MEDICAL CARE IF:   You have increasing pain or swelling in the injured area.  You have numbness, tingling, or a significant loss of strength in the injured area. MAKE SURE YOU:   Understand these instructions.  Will watch your condition.  Will get help right away if you are not doing well or get worse. Document Released: 07/13/2005 Document Revised: 05/03/2013 Document Reviewed: 02/09/2013 [facility] Patient Information 2015 [place], [place]. This information is not intended to replace advice given to you by your health care provider. Make sure you discuss any questions you have with your health care provider.   Gluteal Strain The muscles in your butt (buttocks) are called gluteal muscles. A gluteal strain means that the muscles are stretched or have small tears. This can cause pain or stiffness in your buttocks. You also might feel pain in your lower back.  Anyone can strain gluteal muscles. However, it happens often to dancers, runners, and other athletes. CAUSES  There are various causes of gluteal strain. They include:   Stretching the gluteal muscles too far.  Putting too much stress on the muscles before they are warmed up.  Overusing the muscles.  Getting hit hard on the gluteal muscles (a bruise or contusion). Gluteal strain is more likely to happen when:  You are working out in cold weather.  You are tired.  You are doing exercise that requires a sudden burst of activity. Sprinting is an example. SYMPTOMS  Pain in the buttocks when moving the leg. Sometimes the pain extends into the lower back.  Tenderness in the buttocks.  Stiffness or weakness of the buttocks.  Bruising. DIAGNOSIS  To decide if you have gluteal strain, a health care provider will probably:  Ask what symptoms you have.  Ask about the location of your pain, when it started, and when it  occurs.  Review your overall health.  Physically examine the buttocks muscles.  Have you do some hip motion exercises. They are called range of motion exercises. You will move your leg in specific ways, sometimes with the health care provider pushing against your leg. This will show where the pain is coming from.  Order a magnetic resonance imaging (MRI) scan. This machine uses a magnet and a computer to take pictures of your muscles and tendons. This can help find the cause of your pain. It also may show how severe the strain is. Your strain may be rated:  Grade 1 strain (mild): Muscles are stretched. There might be very tiny tears. This type of strain should heal in about a week.  Grade 2 strain (moderate): Muscles are partly torn. It may take one to two months to heal.  Grade 3 strain (more severe): Muscles are ruptured (completely torn). This is rare with gluteal muscles. A severe strain can take more than three months to heal. A severe strain may require surgery, but this is rare. TREATMENT  Several treatment methods can be used. Be sure to discuss the options with your health care provider. They include:  Rest. Take a break from the activity that caused the injury.  Cold packs. Applying cold to your buttocks may ease swelling and pain.  Pain medication. Only take over-the-counter medicines for pain and discomfort as directed by your health care provider.  Physical therapy. You will work on specific exercises to get the gluteal muscles back in shape. These exercises usually involve stretching. A physical therapist can show you what to do and what not to do as you heal. HOME CARE INSTRUCTIONS   Take any pain medication suggested by your health care provider. Follow the directions carefully.  Use cold packs as directed by your health care provider or physical therapist.  If you have physical therapy, follow through with the therapist's suggestions. Be sure you understand the  exercises you will be doing, including:  How often the exercises should be done.  How many times each exercise should be repeated.  How long they should be done.  Other activities you should or should not do.  Learn as much as you can about training for your sport or exercise. This should help you avoid future muscle strains. SEEK MEDICAL CARE IF:   Pain, stiffness, or weakness gets worse.  You have questions about any medications. Document Released: 05/10/2009 Document Revised: 11/27/2013 Document Reviewed: 05/10/2009 [id number] Patient Information 2015 [place], [place]. This information is not intended to replace advice given to you by your health care provider. Make sure you discuss any questions you have with your health care provider.

## 2014-05-19 NOTE — Progress Notes (Signed)
   Subjective:    Patient ID: Mary Moss, female    DOB: 07-Jun-1936, 78 y.o.   MRN: [id number]  HPI Patient presents today with complaints of left buttocks pain. Onset yesterday. Reports pain worse when attempting to bend or sit. Mary Moss reports the pain is less when standing. Rates pain as 6 on 1-10 scale. She reports loading a table onto truck on Thursday and didn't use proper body mechanics, which she believes cause injury. Denies taking any OTC medications to relieve pain.     Review of Systems  Constitutional: Negative for fever and chills.  Respiratory: Negative for chest tightness and shortness of breath.   Cardiovascular: Negative for chest pain and palpitations.  Musculoskeletal:       Left buttocks area pain.  Neurological: Negative for numbness.       Objective:   Physical Exam  Constitutional: She is oriented to person, place, and time. She appears well-developed and well-nourished.  Pulmonary/Chest: Effort normal and breath sounds normal. No respiratory distress. She exhibits no tenderness.  Musculoskeletal: She exhibits edema and tenderness.  Mild edema noted to left gluteal area, tenderness upon palpation, negative bruising. Negative limitation in range of motion left leg or hip, discomfort noted.   Neurological: She is alert and oriented to person, place, and time.  Skin: Skin is warm and dry.  Psychiatric: She has a normal mood and affect.          Assessment & Plan:  1. Muscle strain of gluteal region, left, initial encounter  - etodolac (LODINE) 300 MG capsule; Take 1 capsule (300 mg total) by mouth 3 (three) times daily.  Dispense: 21 capsule; Refill: 0  2. Gluteal pain  - ketorolac (TORADOL) injection 60 mg; Inject 2 mLs (60 mg total) into the muscle once. -Provided and discussed patient information material on gluteal strain -ice packs 15-20 minutes, rest, NSAID -RTO if symptoms worsen or unresolved -Patient verbalized understanding Erby Pian, FNP-C

## 2014-06-18 ENCOUNTER — Ambulatory Visit (INDEPENDENT_AMBULATORY_CARE_PROVIDER_SITE_OTHER): Payer: Medicare Other | Admitting: Obstetrics & Gynecology

## 2014-06-18 ENCOUNTER — Encounter: Payer: Self-pay | Admitting: Obstetrics & Gynecology

## 2014-06-18 VITALS — BP 152/91 | HR 80 | Resp 16 | Ht 68.0 in | Wt 222.0 lb

## 2014-06-18 DIAGNOSIS — B373 Candidiasis of vulva and vagina: Secondary | ICD-10-CM

## 2014-06-18 DIAGNOSIS — Z01411 Encounter for gynecological examination (general) (routine) with abnormal findings: Secondary | ICD-10-CM

## 2014-06-18 DIAGNOSIS — B3731 Acute candidiasis of vulva and vagina: Secondary | ICD-10-CM

## 2014-06-18 MED ORDER — TERCONAZOLE 0.4 % VA CREA: 1.0000 | TOPICAL_CREAM | Freq: Every day | VAGINAL | Status: DC

## 2014-06-18 NOTE — Progress Notes (Signed)
Patient ID: MIAISABELLA BACORN, female   DOB: 1936/04/27, 78 y.o.   MRN: [id number]  Chief Complaint  Patient presents with  . Gynecologic Exam    HPI LUDY MESSAMORE is a 78 y.o. female.  No LMP recorded. Patient has had a hysterectomy. At age 49. Vaginal itch minimal discharge, some relief with HC 1/5 cream.    HPI  Past Medical History  Diagnosis Date  . Basal cell carcinoma 2015    Nose  . GERD (gastroesophageal reflux disease)   . Ankle fracture fx 13 yrs ago, anklw swells off and on, has cramps in ankle also    left, to seee dr hewitt about 02-16-14    Past Surgical History  Procedure Laterality Date  . Abdominal hysterectomy    . Rotator cuff repair Right 2009  . Fracture surgery Left 2001  . Knee arthroscopy Right 02/07/2014    Procedure: RIGHT ARTHROSCOPY KNEE, WITH MEDIAL MENISCAL DEBRIDEMENT AND CHONDRAPLASTY;  Surgeon: Gearlean Alf, MD;  Location: [facility];  Service: Orthopedics;  Laterality: Right;    Family History  Problem Relation Age of Onset  . Cancer Mother     colon  . Cancer Father     lung    Social History History  Substance Use Topics  . Smoking status: Former Smoker -- 0.50 packs/day for 40 years    Types: Cigarettes    Start date: 07/27/1957    Quit date: 12/15/2007  . Smokeless tobacco: Never Used  . Alcohol Use: No    No Known Allergies  Current Outpatient Prescriptions  Medication Sig Dispense Refill  . terconazole (TERAZOL 7) 0.4 % vaginal cream Place 1 applicator vaginally at bedtime. 45 g 0   No current facility-administered medications for this visit.    Review of Systems Review of Systems  Constitutional: Negative.   Respiratory: Negative.   Genitourinary: Positive for vaginal discharge (and itch).    Blood pressure 152/91, pulse 80, resp. rate 16, height 5\' 8"  (1.727 m), weight 222 lb (100.699 kg).  Physical Exam Physical Exam  Constitutional: She is oriented to person, place, and time. She appears well-developed.  No distress.  Neck: Normal range of motion. Neck supple.  Pulmonary/Chest: Effort normal and breath sounds normal.  Breasts: breasts appear normal, no suspicious masses, no skin or nipple changes or axillary nodes.   Abdominal: Soft. She exhibits no distension. There is no tenderness.  Genitourinary: Vaginal discharge: scant white, wet prep.  Mild atropy, vulvar erythema noted symmetrically  Neurological: She is alert and oriented to person, place, and time.  Skin: Skin is warm and dry.  Psychiatric: She has a normal mood and affect. Her behavior is normal.    Data Reviewed history  Assessment    Suspect yeast vulvovaginitis.   well woman exam  Plan    Terazol, RTC 6 weeks to check for resolution of erythema        Gayleen Sholtz 06/18/2014, 11:09 AM

## 2014-06-18 NOTE — Addendum Note (Signed)
Addended by: Gretchen Short on: 06/18/2014 01:35 PM   Modules accepted: Orders

## 2014-06-18 NOTE — Patient Instructions (Signed)
Mammography Mammography is an X-ray of the breasts to look for changes that are not normal. The X-ray image is called a mammogram. This procedure can screen for breast cancer, can detect cancer early, and can diagnose cancer.  LET YOUR CAREGIVER KNOW ABOUT:  Breast implants.  Previous breast disease, biopsy, or surgery.  If you are breastfeeding.  Medicines taken, including vitamins, herbs, eyedrops, over-the-counter medicines, and creams.  Use of steroids (by mouth or creams).  Possibility of pregnancy, if this applies. RISKS AND COMPLICATIONS  Exposure to radiation, but at very low levels.  The results may be misinterpreted.  The results may not be accurate.  Mammography may lead to further tests.  Mammography may not catch certain cancers. BEFORE THE PROCEDURE  Schedule your test about 7 days after your menstrual period. This is when your breasts are the least tender and have signs of hormone changes.  If you have had a mammography done at a different facility in the past, get the mammogram X-rays or have them sent to your current exam facility in order to compare them.  Wash your breasts and under your arms the day of the test.  Do not wear deodorants, perfumes, or powders anywhere on your body.  Wear clothes that you can change in and out of easily. PROCEDURE Relax as much as possible during the test. Any discomfort during the test will be very brief. The test should take less than 30 minutes. The following will happen:  You will undress from the waist up and put on a gown.  You will stand in front of the X-ray machine.  Each breast will be placed between 2 plastic or glass plates. The plates will compress your breast for a few seconds.  X-rays will be taken from different angles of the breast. AFTER THE PROCEDURE  The mammogram will be examined.  Depending on the quality of the images, you may need to repeat certain parts of the test.  Ask when your test  results will be ready. Make sure you get your test results.  You may resume normal activities. Document Released: 07/10/2000 Document Revised: 10/05/2011 Document Reviewed: 05/03/2011 [employer] Patient Information 2015 [employer]. This information is not intended to replace advice given to you by your health care provider. Make sure you discuss any questions you have with your health care provider.  

## 2014-06-19 ENCOUNTER — Telehealth: Payer: Self-pay | Admitting: *Deleted

## 2014-06-19 DIAGNOSIS — N760 Acute vaginitis: Principal | ICD-10-CM

## 2014-06-19 DIAGNOSIS — B9689 Other specified bacterial agents as the cause of diseases classified elsewhere: Secondary | ICD-10-CM

## 2014-06-19 LAB — WET PREP BY MOLECULAR PROBE
Candida species: NEGATIVE
Gardnerella vaginalis: POSITIVE — AB
TRICHOMONAS VAG: NEGATIVE

## 2014-06-19 MED ORDER — METRONIDAZOLE 500 MG PO TABS: 500.0000 mg | ORAL_TABLET | Freq: Two times a day (BID) | ORAL | Status: AC

## 2014-06-19 NOTE — Telephone Encounter (Signed)
Called pt to adv positive BV and Flagyl at pharm. Pt expressed understanding.

## 2014-07-24 DIAGNOSIS — Z1231 Encounter for screening mammogram for malignant neoplasm of breast: Secondary | ICD-10-CM | POA: Diagnosis not present

## 2014-07-30 ENCOUNTER — Other Ambulatory Visit: Payer: Self-pay | Admitting: Dermatology

## 2014-07-30 DIAGNOSIS — D0471 Carcinoma in situ of skin of right lower limb, including hip: Secondary | ICD-10-CM | POA: Diagnosis not present

## 2014-07-30 DIAGNOSIS — L814 Other melanin hyperpigmentation: Secondary | ICD-10-CM | POA: Diagnosis not present

## 2014-07-30 DIAGNOSIS — C44519 Basal cell carcinoma of skin of other part of trunk: Secondary | ICD-10-CM | POA: Diagnosis not present

## 2014-07-30 DIAGNOSIS — Z85828 Personal history of other malignant neoplasm of skin: Secondary | ICD-10-CM | POA: Diagnosis not present

## 2014-07-30 DIAGNOSIS — L821 Other seborrheic keratosis: Secondary | ICD-10-CM | POA: Diagnosis not present

## 2014-07-30 DIAGNOSIS — D1801 Hemangioma of skin and subcutaneous tissue: Secondary | ICD-10-CM | POA: Diagnosis not present

## 2014-08-11 ENCOUNTER — Ambulatory Visit (INDEPENDENT_AMBULATORY_CARE_PROVIDER_SITE_OTHER): Payer: Medicare Other | Admitting: *Deleted

## 2014-08-11 DIAGNOSIS — Z23 Encounter for immunization: Secondary | ICD-10-CM

## 2014-09-25 ENCOUNTER — Telehealth: Payer: Self-pay | Admitting: Nurse Practitioner

## 2014-09-25 NOTE — Telephone Encounter (Signed)
Patient was advised to call dermatologist since she just had the spot removed about 6 weeks ago.

## 2014-09-26 DIAGNOSIS — L910 Hypertrophic scar: Secondary | ICD-10-CM | POA: Diagnosis not present

## 2014-09-26 DIAGNOSIS — Z85828 Personal history of other malignant neoplasm of skin: Secondary | ICD-10-CM | POA: Diagnosis not present

## 2014-10-09 ENCOUNTER — Ambulatory Visit: Payer: Medicare Other | Admitting: Family Medicine

## 2014-10-27 IMAGING — CR DG KNEE COMPLETE 4+V*R*
4 series · 4 of 4 positions shown · non-contrast
Comparison: None.

CLINICAL DATA: Knee pain.

EXAM:
RIGHT KNEE - COMPLETE 4+ VIEW

[view not recorded (1 of 4)]
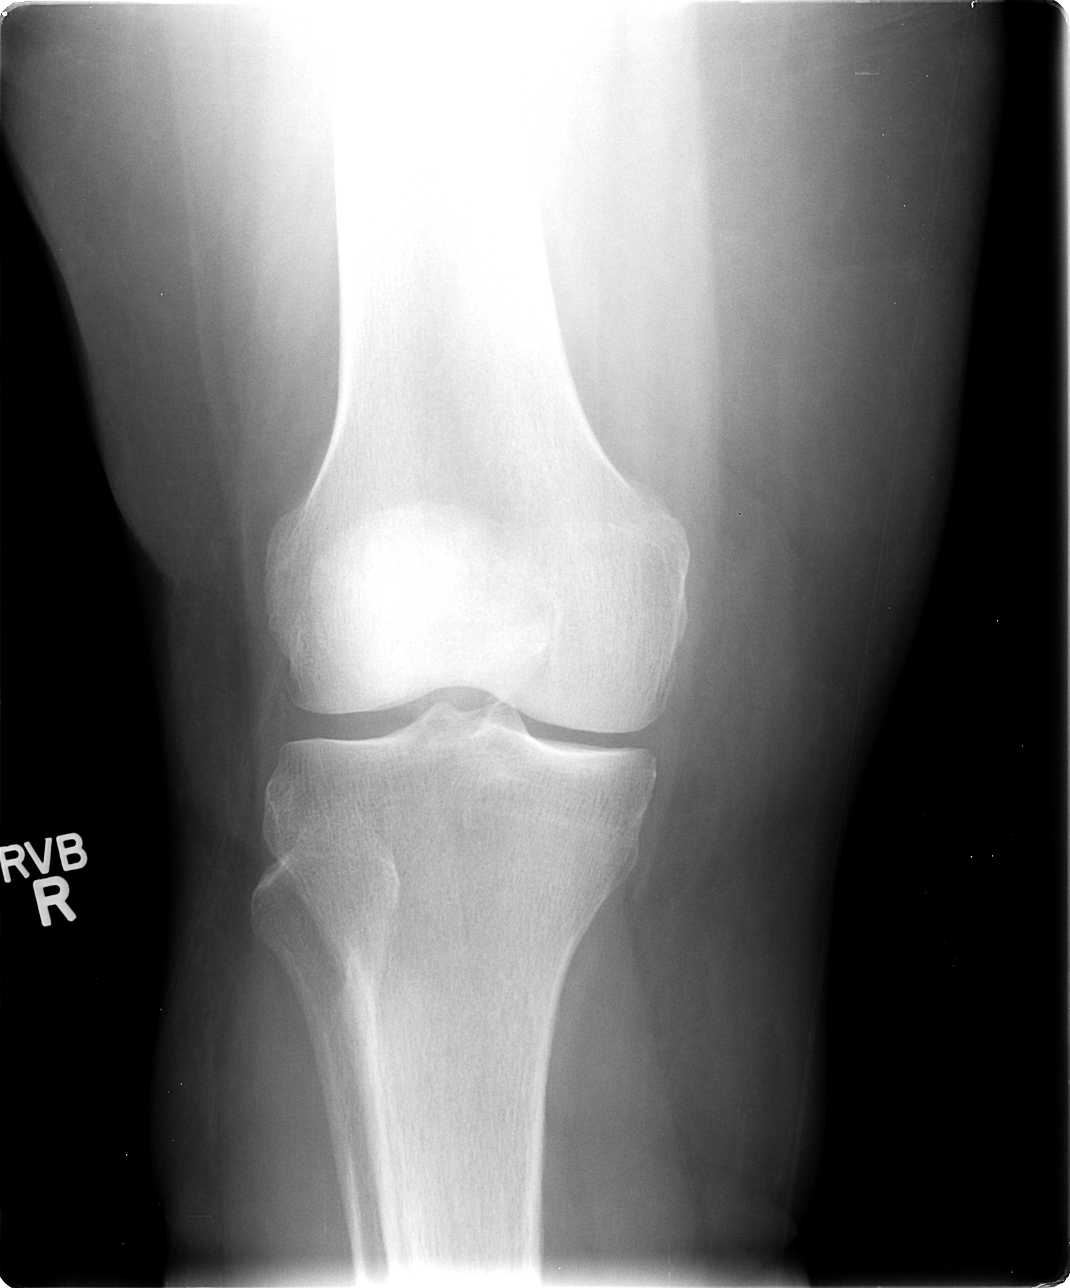

[view not recorded (2 of 4)]
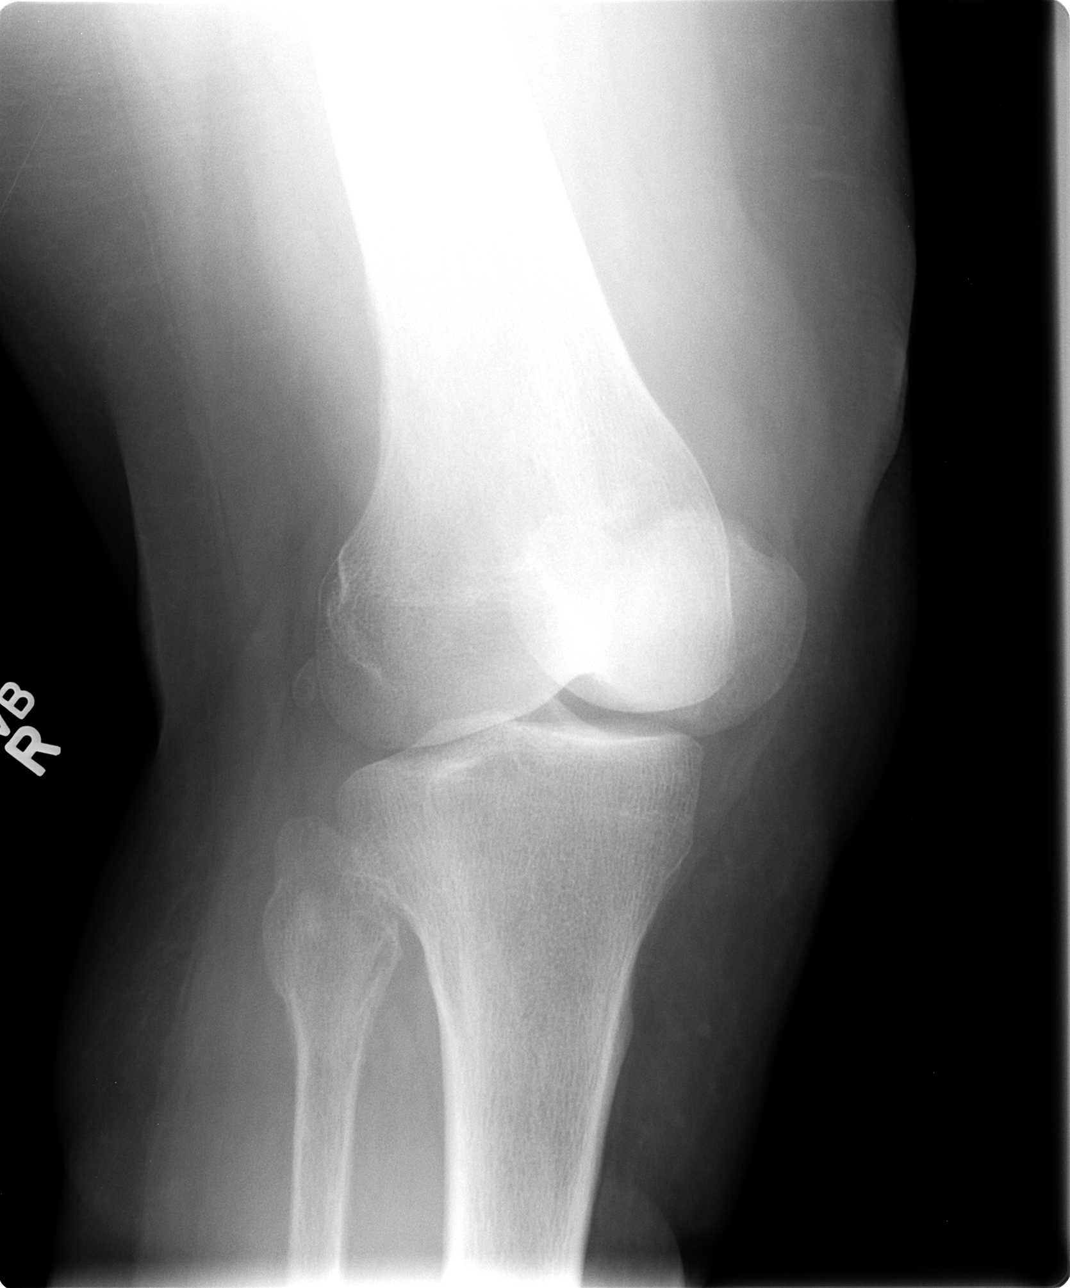

[view not recorded (3 of 4)]
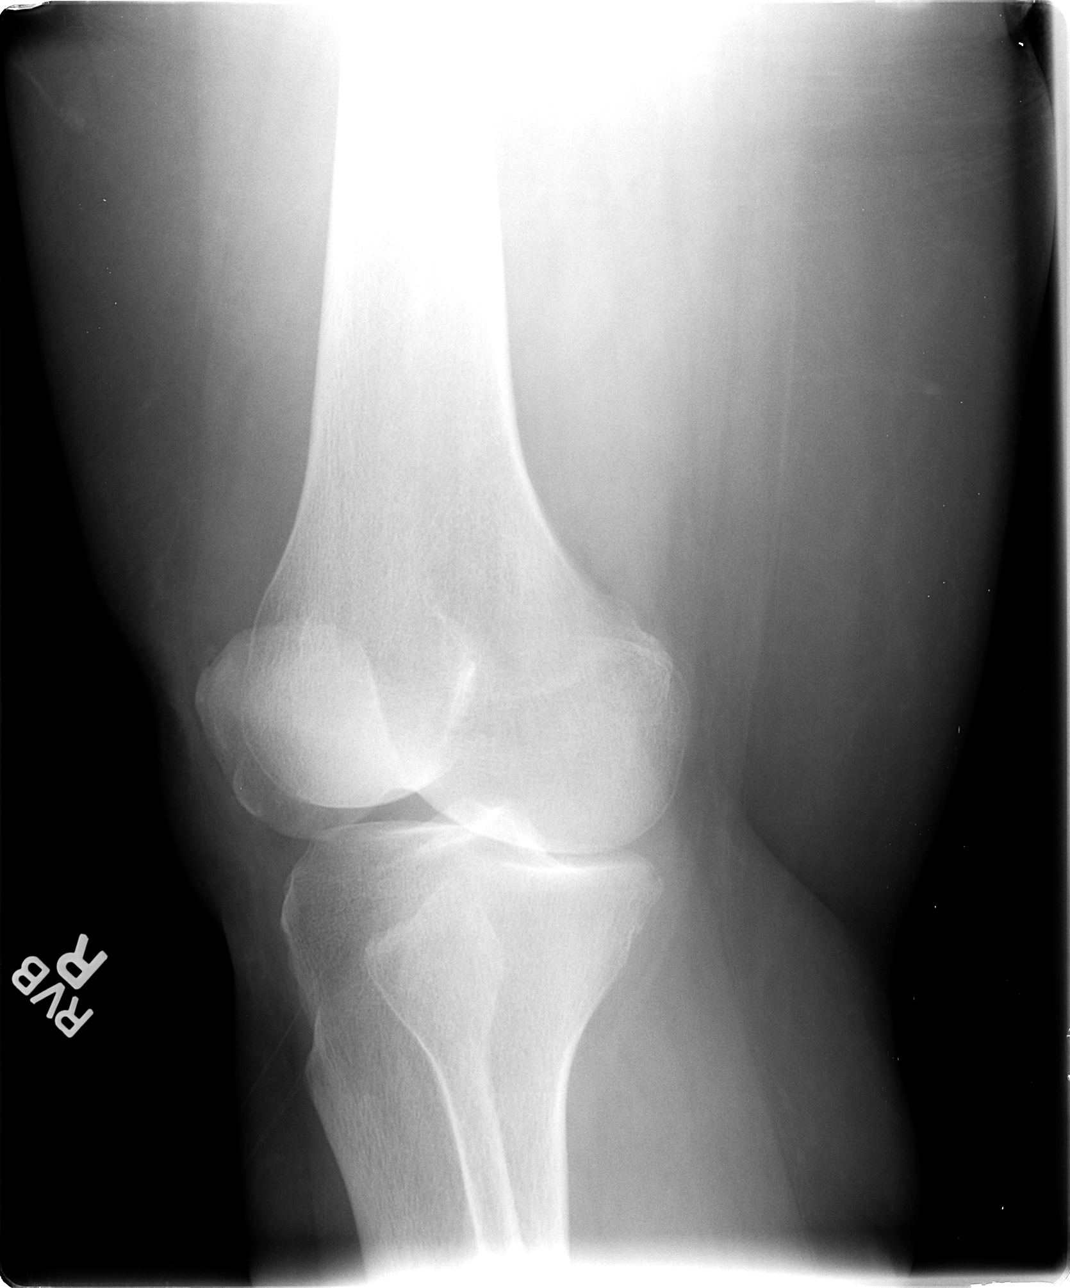

[view not recorded (4 of 4)]
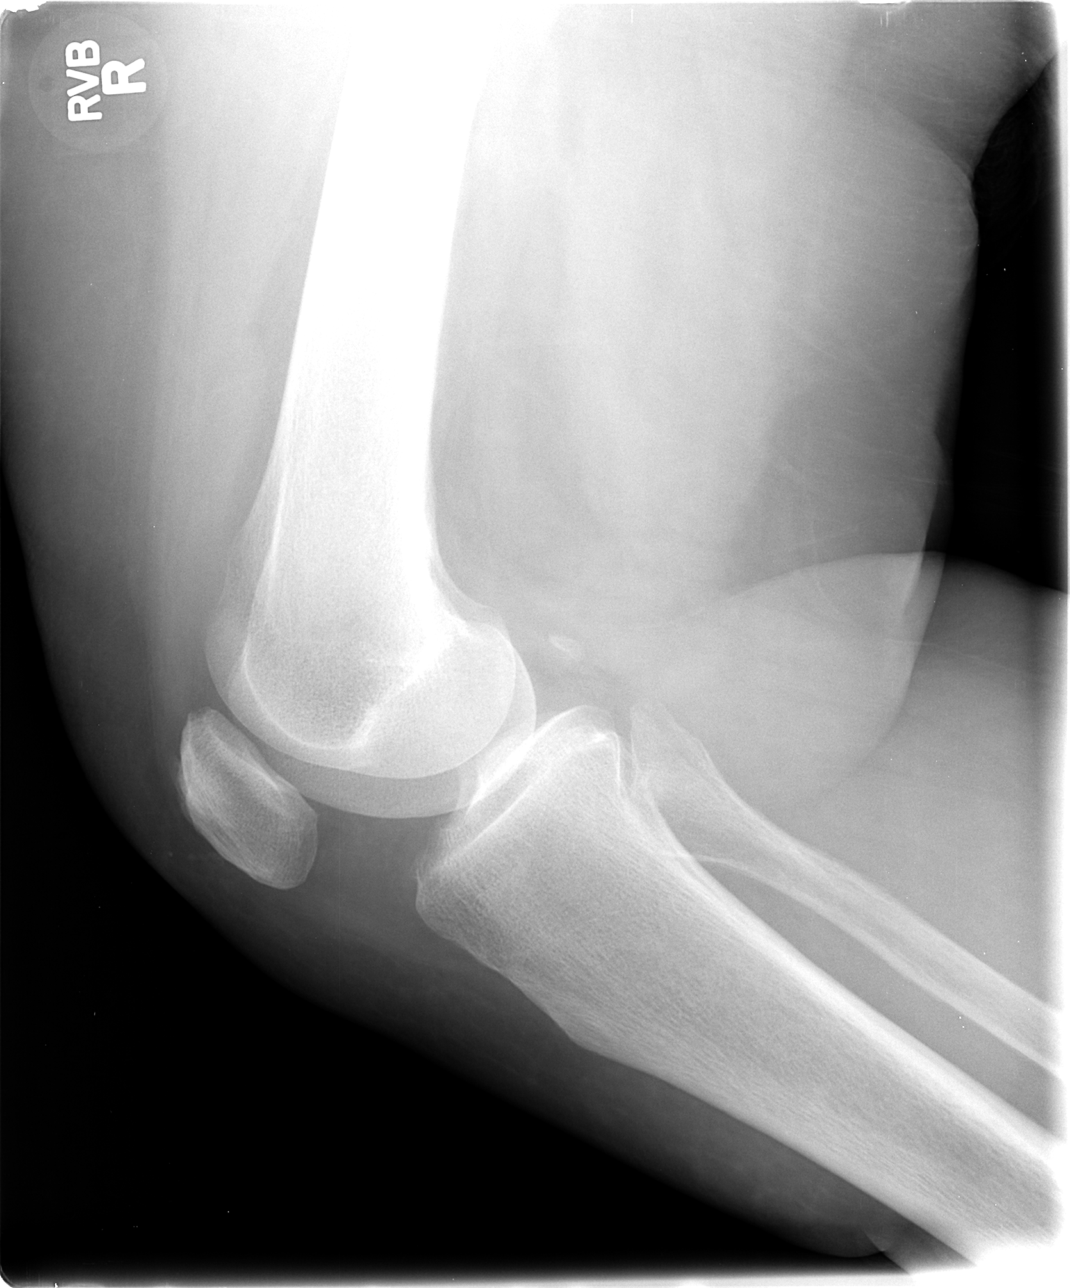

[4 of 4 positions shown; findings below may reference images not displayed]

FINDINGS: No acute bony abnormality. Specifically, no fracture, subluxation,
or dislocation. Soft tissues are intact. Joint spaces are
maintained. Normal bone mineralization. No significant joint
effusion.
IMPRESSION: No acute bony abnormality.

## 2014-11-27 DIAGNOSIS — M6702 Short Achilles tendon (acquired), left ankle: Secondary | ICD-10-CM | POA: Diagnosis not present

## 2014-11-27 DIAGNOSIS — M19172 Post-traumatic osteoarthritis, left ankle and foot: Secondary | ICD-10-CM | POA: Diagnosis not present

## 2014-12-06 ENCOUNTER — Other Ambulatory Visit (HOSPITAL_COMMUNITY): Payer: Self-pay | Admitting: *Deleted

## 2014-12-06 NOTE — Pre-Procedure Instructions (Addendum)
Mary Moss  12/06/2014   Your procedure is scheduled on:  Thursday, Dec 20, 2014 at 7:30 AM.   Report to [facility] Entrance "A" Admitting Office at 5:30 AM.   Call this number if you have problems the morning of surgery: [phone]               Any questions prior to day of surgery, please call [phone] between 8 & 4 PM.   Remember:   Do not eat food or drink liquids after midnight Wednesday. 12/19/14.  Take these medicines the morning of surgery with A SIP OF WATER: none   Do not wear jewelry, make-up or nail polish.  Do not wear lotions, powders, or perfumes. You may wear deodorant.  Do not shave 48 hours prior to surgery.   Do not bring valuables to the hospital.  [facility] is not responsible  for any belongings or valuables.               Contacts, dentures or bridgework may not be worn into surgery.  Leave suitcase in the car. After surgery it may be brought to your room.  For patients admitted to the hospital, discharge time is determined by your treatment team.               Special Instructions: See "Preparing for Surgery" Instruction sheet.    Please read over the following fact sheets that you were given: Pain Booklet, Coughing and Deep Breathing, MRSA Information and Surgical Site Infection Prevention

## 2014-12-06 NOTE — Progress Notes (Signed)
Called Dr. Nona Dell office to request pre-op orders. Left message on Wendy's voicemail (his scheduler).

## 2014-12-07 ENCOUNTER — Encounter (HOSPITAL_COMMUNITY)
Admission: RE | Admit: 2014-12-07 | Discharge: 2014-12-07 | Disposition: A | Discharge status: Home / Self Care | Payer: Medicare Other | Source: Ambulatory Visit | Attending: Orthopedic Surgery | Admitting: Orthopedic Surgery

## 2014-12-07 ENCOUNTER — Encounter (HOSPITAL_COMMUNITY): Payer: Self-pay

## 2014-12-07 DIAGNOSIS — Z01812 Encounter for preprocedural laboratory examination: Secondary | ICD-10-CM | POA: Insufficient documentation

## 2014-12-07 DIAGNOSIS — M1990 Unspecified osteoarthritis, unspecified site: Secondary | ICD-10-CM | POA: Diagnosis not present

## 2014-12-07 DIAGNOSIS — M19072 Primary osteoarthritis, left ankle and foot: Secondary | ICD-10-CM | POA: Insufficient documentation

## 2014-12-07 DIAGNOSIS — M19172 Post-traumatic osteoarthritis, left ankle and foot: Secondary | ICD-10-CM | POA: Diagnosis not present

## 2014-12-07 HISTORY — DX: Unspecified osteoarthritis, unspecified site: M19.90

## 2014-12-07 LAB — CBC
HCT: 43.7 % (ref 36.0–46.0)
Hemoglobin: 13.9 g/dL (ref 12.0–15.0)
MCH: 29.8 pg (ref 26.0–34.0)
MCHC: 31.8 g/dL (ref 30.0–36.0)
MCV: 93.8 fL (ref 78.0–100.0)
Platelets: 246 10*3/uL (ref 150–400)
RBC: 4.66 MIL/uL (ref 3.87–5.11)
RDW: 13.3 % (ref 11.5–15.5)
WBC: 6.5 10*3/uL (ref 4.0–10.5)

## 2014-12-07 LAB — SURGICAL PCR SCREEN
MRSA, PCR: NEGATIVE
STAPHYLOCOCCUS AUREUS: POSITIVE — AB

## 2014-12-07 NOTE — Progress Notes (Signed)
Pt. Notified of positive PCR. Rx.called to [facility] in [place]

## 2014-12-20 ENCOUNTER — Inpatient Hospital Stay (HOSPITAL_COMMUNITY): Payer: Medicare Other | Admitting: Anesthesiology

## 2014-12-20 ENCOUNTER — Inpatient Hospital Stay (HOSPITAL_COMMUNITY)
Admission: RE | Admit: 2014-12-20 | Discharge: 2014-12-21 | DRG: 470 | Disposition: A | Discharge status: Home Health | Payer: Medicare Other | Source: Ambulatory Visit | Attending: Orthopedic Surgery | Admitting: Orthopedic Surgery

## 2014-12-20 ENCOUNTER — Encounter (HOSPITAL_COMMUNITY)
Admission: RE | Disposition: A | Discharge status: Home Health | Payer: Self-pay | Source: Ambulatory Visit | Attending: Orthopedic Surgery

## 2014-12-20 ENCOUNTER — Encounter (HOSPITAL_COMMUNITY): Payer: Self-pay | Admitting: *Deleted

## 2014-12-20 ENCOUNTER — Inpatient Hospital Stay (HOSPITAL_COMMUNITY): Payer: Medicare Other

## 2014-12-20 DIAGNOSIS — Z419 Encounter for procedure for purposes other than remedying health state, unspecified: Secondary | ICD-10-CM

## 2014-12-20 DIAGNOSIS — Z90710 Acquired absence of both cervix and uterus: Secondary | ICD-10-CM

## 2014-12-20 DIAGNOSIS — M19179 Post-traumatic osteoarthritis, unspecified ankle and foot: Secondary | ICD-10-CM | POA: Diagnosis present

## 2014-12-20 DIAGNOSIS — G8918 Other acute postprocedural pain: Secondary | ICD-10-CM | POA: Diagnosis not present

## 2014-12-20 DIAGNOSIS — M12572 Traumatic arthropathy, left ankle and foot: Secondary | ICD-10-CM | POA: Diagnosis not present

## 2014-12-20 DIAGNOSIS — Z888 Allergy status to other drugs, medicaments and biological substances status: Secondary | ICD-10-CM | POA: Diagnosis not present

## 2014-12-20 DIAGNOSIS — Z471 Aftercare following joint replacement surgery: Secondary | ICD-10-CM | POA: Diagnosis not present

## 2014-12-20 DIAGNOSIS — Z87891 Personal history of nicotine dependence: Secondary | ICD-10-CM | POA: Diagnosis not present

## 2014-12-20 DIAGNOSIS — Z79899 Other long term (current) drug therapy: Secondary | ICD-10-CM

## 2014-12-20 DIAGNOSIS — M6702 Short Achilles tendon (acquired), left ankle: Secondary | ICD-10-CM | POA: Diagnosis not present

## 2014-12-20 DIAGNOSIS — Z791 Long term (current) use of non-steroidal anti-inflammatories (NSAID): Secondary | ICD-10-CM

## 2014-12-20 DIAGNOSIS — K219 Gastro-esophageal reflux disease without esophagitis: Secondary | ICD-10-CM | POA: Diagnosis not present

## 2014-12-20 DIAGNOSIS — M19172 Post-traumatic osteoarthritis, left ankle and foot: Secondary | ICD-10-CM | POA: Diagnosis not present

## 2014-12-20 DIAGNOSIS — M19072 Primary osteoarthritis, left ankle and foot: Secondary | ICD-10-CM | POA: Diagnosis not present

## 2014-12-20 DIAGNOSIS — Z96662 Presence of left artificial ankle joint: Secondary | ICD-10-CM | POA: Diagnosis not present

## 2014-12-20 DIAGNOSIS — M25572 Pain in left ankle and joints of left foot: Secondary | ICD-10-CM | POA: Diagnosis not present

## 2014-12-20 HISTORY — PX: TOTAL ANKLE ARTHROPLASTY: SHX811

## 2014-12-20 SURGERY — ARTHROPLASTY, ANKLE, TOTAL
Anesthesia: General | Site: Ankle | Laterality: Left

## 2014-12-20 MED ORDER — PROPOFOL 10 MG/ML IV BOLUS
INTRAVENOUS | Status: DC | PRN
  Administered 2014-12-20: 140 mg via INTRAVENOUS

## 2014-12-20 MED ORDER — ONDANSETRON HCL 4 MG/2ML IJ SOLN: 4.0000 mg | Freq: Four times a day (QID) | INTRAMUSCULAR | Status: DC | PRN

## 2014-12-20 MED ORDER — ONDANSETRON HCL 4 MG/2ML IJ SOLN
INTRAMUSCULAR | Status: AC
  Filled 2014-12-20: qty 2

## 2014-12-20 MED ORDER — ACETAMINOPHEN 650 MG RE SUPP: 650.0000 mg | Freq: Four times a day (QID) | RECTAL | Status: DC | PRN

## 2014-12-20 MED ORDER — ENOXAPARIN SODIUM 40 MG/0.4ML ~~LOC~~ SOLN: 40.0000 mg | SUBCUTANEOUS | Status: DC

## 2014-12-20 MED ORDER — HYDROMORPHONE HCL 1 MG/ML IJ SOLN
0.2500 mg | INTRAMUSCULAR | Status: DC | PRN
  Administered 2014-12-20 (×2): 0.5 mg via INTRAVENOUS

## 2014-12-20 MED ORDER — SENNA 8.6 MG PO TABS
2.0000 | ORAL_TABLET | Freq: Two times a day (BID) | ORAL | Status: DC
  Administered 2014-12-20 – 2014-12-21 (×3): 17.2 mg via ORAL
  Filled 2014-12-20 (×2): qty 2

## 2014-12-20 MED ORDER — SODIUM CHLORIDE 0.9 % IV SOLN
INTRAVENOUS | Status: DC
  Administered 2014-12-20 – 2014-12-21 (×2): via INTRAVENOUS

## 2014-12-20 MED ORDER — BUPIVACAINE-EPINEPHRINE (PF) 0.5% -1:200000 IJ SOLN
INTRAMUSCULAR | Status: DC | PRN
  Administered 2014-12-20: 30 mL via PERINEURAL

## 2014-12-20 MED ORDER — ROCURONIUM BROMIDE 50 MG/5ML IV SOLN
INTRAVENOUS | Status: AC
  Filled 2014-12-20: qty 1

## 2014-12-20 MED ORDER — HYDROMORPHONE HCL 1 MG/ML IJ SOLN: 0.5000 mg | INTRAMUSCULAR | Status: DC | PRN

## 2014-12-20 MED ORDER — VANCOMYCIN HCL 500 MG IV SOLR
INTRAVENOUS | Status: DC | PRN
  Administered 2014-12-20: 500 mg

## 2014-12-20 MED ORDER — PROPOFOL 10 MG/ML IV BOLUS
INTRAVENOUS | Status: AC
  Filled 2014-12-20: qty 20

## 2014-12-20 MED ORDER — CHLORHEXIDINE GLUCONATE 4 % EX LIQD: 60.0000 mL | Freq: Once | CUTANEOUS | Status: DC

## 2014-12-20 MED ORDER — ASPIRIN EC 325 MG PO TBEC
325.0000 mg | DELAYED_RELEASE_TABLET | Freq: Every day | ORAL | Status: DC
  Administered 2014-12-20 – 2014-12-21 (×2): 325 mg via ORAL
  Filled 2014-12-20 (×2): qty 1

## 2014-12-20 MED ORDER — KETOROLAC TROMETHAMINE 15 MG/ML IJ SOLN
7.5000 mg | Freq: Four times a day (QID) | INTRAMUSCULAR | Status: AC
  Administered 2014-12-20 – 2014-12-21 (×4): 7.5 mg via INTRAVENOUS
  Filled 2014-12-20 (×3): qty 1

## 2014-12-20 MED ORDER — LACTATED RINGERS IV SOLN
INTRAVENOUS | Status: DC | PRN
  Administered 2014-12-20: 07:00:00 via INTRAVENOUS

## 2014-12-20 MED ORDER — OXYCODONE HCL 5 MG PO TABS
ORAL_TABLET | ORAL | Status: AC
  Filled 2014-12-20: qty 1

## 2014-12-20 MED ORDER — MAGNESIUM CITRATE PO SOLN: 1.0000 | Freq: Once | ORAL | Status: AC | PRN

## 2014-12-20 MED ORDER — KETOROLAC TROMETHAMINE 15 MG/ML IJ SOLN
INTRAMUSCULAR | Status: AC
  Filled 2014-12-20: qty 1

## 2014-12-20 MED ORDER — HYDROMORPHONE HCL 1 MG/ML IJ SOLN
INTRAMUSCULAR | Status: AC
  Filled 2014-12-20: qty 1

## 2014-12-20 MED ORDER — VANCOMYCIN HCL 500 MG IV SOLR
INTRAVENOUS | Status: AC
  Filled 2014-12-20: qty 500

## 2014-12-20 MED ORDER — CEFAZOLIN SODIUM-DEXTROSE 2-3 GM-% IV SOLR
2.0000 g | INTRAVENOUS | Status: AC
Start: 2014-12-20 — End: 2014-12-20
  Administered 2014-12-20: 2 g via INTRAVENOUS
  Filled 2014-12-20: qty 50

## 2014-12-20 MED ORDER — MIDAZOLAM HCL 5 MG/5ML IJ SOLN
INTRAMUSCULAR | Status: DC | PRN
  Administered 2014-12-20 (×2): 1 mg via INTRAVENOUS

## 2014-12-20 MED ORDER — FENTANYL CITRATE (PF) 100 MCG/2ML IJ SOLN
INTRAMUSCULAR | Status: DC | PRN
  Administered 2014-12-20 (×2): 25 ug via INTRAVENOUS
  Administered 2014-12-20: 50 ug via INTRAVENOUS
  Administered 2014-12-20 (×4): 25 ug via INTRAVENOUS
  Administered 2014-12-20: 50 ug via INTRAVENOUS

## 2014-12-20 MED ORDER — PROMETHAZINE HCL 25 MG/ML IJ SOLN: 6.2500 mg | INTRAMUSCULAR | Status: DC | PRN

## 2014-12-20 MED ORDER — FENTANYL CITRATE (PF) 250 MCG/5ML IJ SOLN
INTRAMUSCULAR | Status: AC
  Filled 2014-12-20: qty 5

## 2014-12-20 MED ORDER — 0.9 % SODIUM CHLORIDE (POUR BTL) OPTIME
TOPICAL | Status: DC | PRN
  Administered 2014-12-20: 1000 mL

## 2014-12-20 MED ORDER — LIDOCAINE HCL (CARDIAC) 20 MG/ML IV SOLN
INTRAVENOUS | Status: DC | PRN
  Administered 2014-12-20: 60 mg via INTRAVENOUS

## 2014-12-20 MED ORDER — LIDOCAINE HCL (CARDIAC) 20 MG/ML IV SOLN
INTRAVENOUS | Status: AC
  Filled 2014-12-20: qty 5

## 2014-12-20 MED ORDER — ONDANSETRON HCL 4 MG/2ML IJ SOLN
INTRAMUSCULAR | Status: DC | PRN
  Administered 2014-12-20: 4 mg via INTRAVENOUS

## 2014-12-20 MED ORDER — ONDANSETRON HCL 4 MG PO TABS: 4.0000 mg | ORAL_TABLET | Freq: Four times a day (QID) | ORAL | Status: DC | PRN

## 2014-12-20 MED ORDER — ACETAMINOPHEN 325 MG PO TABS
650.0000 mg | ORAL_TABLET | Freq: Four times a day (QID) | ORAL | Status: DC | PRN
Start: 2014-12-20 — End: 2014-12-21

## 2014-12-20 MED ORDER — BUPIVACAINE HCL (PF) 0.25 % IJ SOLN
INTRAMUSCULAR | Status: AC
  Filled 2014-12-20: qty 30

## 2014-12-20 MED ORDER — OXYCODONE HCL 5 MG PO TABS
5.0000 mg | ORAL_TABLET | ORAL | Status: DC | PRN
  Administered 2014-12-20 (×2): 10 mg via ORAL
  Administered 2014-12-20 (×2): 5 mg via ORAL
  Administered 2014-12-21: 10 mg via ORAL
  Filled 2014-12-20: qty 2
  Filled 2014-12-20: qty 1
  Filled 2014-12-20 (×2): qty 2

## 2014-12-20 MED ORDER — SODIUM CHLORIDE 0.9 % IV SOLN: INTRAVENOUS | Status: DC

## 2014-12-20 MED ORDER — MIDAZOLAM HCL 2 MG/2ML IJ SOLN
INTRAMUSCULAR | Status: AC
  Filled 2014-12-20: qty 2

## 2014-12-20 MED ORDER — DOCUSATE SODIUM 100 MG PO CAPS
100.0000 mg | ORAL_CAPSULE | Freq: Two times a day (BID) | ORAL | Status: DC
  Administered 2014-12-20 – 2014-12-21 (×3): 100 mg via ORAL
  Filled 2014-12-20 (×3): qty 1

## 2014-12-20 SURGICAL SUPPLY — 53 items
BANDAGE ESMARK 6X9 LF (GAUZE/BANDAGES/DRESSINGS) ×1 IMPLANT
BLADE RECIP (BLADE) ×2 IMPLANT
BLADE RECIPRO TAPERED (BLADE) ×3 IMPLANT
BLADE SURG 15 STRL LF DISP TIS (BLADE) ×2 IMPLANT
BLADE SURG 15 STRL SS (BLADE) ×6
BNDG CMPR 9X6 STRL LF SNTH (GAUZE/BANDAGES/DRESSINGS) ×1
BNDG ESMARK 6X9 LF (GAUZE/BANDAGES/DRESSINGS) ×3
BNDG GAUZE ELAST 4 BULKY (GAUZE/BANDAGES/DRESSINGS) ×2 IMPLANT
CHLORAPREP W/TINT 26ML (MISCELLANEOUS) ×5 IMPLANT
CORE SLIDING POLY ANKLE 9MM (Orthopedic Implant) ×3 IMPLANT
COVER SURGICAL LIGHT HANDLE (MISCELLANEOUS) ×3 IMPLANT
CUFF TOURNIQUET SINGLE 34IN LL (TOURNIQUET CUFF) ×3 IMPLANT
DISPOSABLES PACK SBI (PACKS) ×2 IMPLANT
DRAPE C-ARM 42X72 X-RAY (DRAPES) ×3 IMPLANT
DRAPE C-ARMOR (DRAPES) ×3 IMPLANT
DRAPE EXTREMITY T 121X128X90 (DRAPE) ×3 IMPLANT
DRAPE IMP U-DRAPE 54X76 (DRAPES) ×2 IMPLANT
DRSG MEPILEX BORDER 4X4 (GAUZE/BANDAGES/DRESSINGS) ×3 IMPLANT
DRSG MEPITEL 4X7.2 (GAUZE/BANDAGES/DRESSINGS) ×3 IMPLANT
ELECT REM PT RETURN 9FT ADLT (ELECTROSURGICAL) ×3
ELECTRODE REM PT RTRN 9FT ADLT (ELECTROSURGICAL) ×1 IMPLANT
EVACUATOR 1/8 PVC DRAIN (DRAIN) ×3 IMPLANT
GAUZE SPONGE 4X4 12PLY STRL (GAUZE/BANDAGES/DRESSINGS) ×6 IMPLANT
GLOVE BIO SURGEON STRL SZ 6 (GLOVE) ×2 IMPLANT
GLOVE BIO SURGEON STRL SZ8 (GLOVE) ×3 IMPLANT
GLOVE BIOGEL PI IND STRL 6.5 (GLOVE) IMPLANT
GLOVE BIOGEL PI IND STRL 8 (GLOVE) ×1 IMPLANT
GLOVE BIOGEL PI INDICATOR 6.5 (GLOVE) ×6
GLOVE BIOGEL PI INDICATOR 8 (GLOVE) ×4
GLOVE SURG SS PI 8.0 STRL IVOR (GLOVE) ×6 IMPLANT
GOWN STRL REUS W/ TWL LRG LVL3 (GOWN DISPOSABLE) ×1 IMPLANT
GOWN STRL REUS W/TWL LRG LVL3 (GOWN DISPOSABLE) ×3
IMPLANT TALAR STAR SZ S LF (Orthopedic Implant) ×2 IMPLANT
IMPLANT TIBIAL STAR SZ L (Orthopedic Implant) ×2 IMPLANT
KIT BASIN OR (CUSTOM PROCEDURE TRAY) ×3 IMPLANT
KIT ROOM TURNOVER OR (KITS) ×3 IMPLANT
NS IRRIG 1000ML POUR BTL (IV SOLUTION) ×3 IMPLANT
PACK TOTAL JOINT (CUSTOM PROCEDURE TRAY) ×3 IMPLANT
PAD ABD 8X10 STRL (GAUZE/BANDAGES/DRESSINGS) ×6 IMPLANT
PAD ARMBOARD 7.5X6 YLW CONV (MISCELLANEOUS) ×6 IMPLANT
PAD CAST 4YDX4 CTTN HI CHSV (CAST SUPPLIES) ×1 IMPLANT
PADDING CAST COTTON 4X4 STRL (CAST SUPPLIES) ×3
PADDING CAST COTTON 6X4 STRL (CAST SUPPLIES) ×5 IMPLANT
SPONGE GAUZE 4X4 12PLY STER LF (GAUZE/BANDAGES/DRESSINGS) ×3 IMPLANT
SURGILUBE 2OZ TUBE FLIPTOP (MISCELLANEOUS) ×3 IMPLANT
SUT ETHILON 3 0 PS 1 (SUTURE) ×3 IMPLANT
SUT MNCRL AB 3-0 PS2 18 (SUTURE) ×3 IMPLANT
SUT PROLENE 3 0 PS 2 (SUTURE) ×6 IMPLANT
SUT VIC AB 0 CT1 27 (SUTURE) ×6
SUT VIC AB 0 CT1 27XBRD ANBCTR (SUTURE) ×1 IMPLANT
TOWEL OR 17X24 6PK STRL BLUE (TOWEL DISPOSABLE) ×3 IMPLANT
TOWEL OR 17X26 10 PK STRL BLUE (TOWEL DISPOSABLE) ×3 IMPLANT
WATER STERILE IRR 1000ML POUR (IV SOLUTION) ×3 IMPLANT

## 2014-12-20 NOTE — Transfer of Care (Signed)
Immediate Anesthesia Transfer of Care Note  Patient: Mary Moss  Procedure(s) Performed: Procedure(s): LEFT TOTAL ANKLE ARTHOPLASTY WITH PERCUTANEOUS ACHILLES TENDON LENGTHENING (Left)  Patient Location: PACU  Anesthesia Type:General  Level of Consciousness: awake  Airway & Oxygen Therapy: Patient Spontanous Breathing and Patient connected to nasal cannula oxygen  Post-op Assessment: Report given to RN and Post -op Vital signs reviewed and stable  Post vital signs: Reviewed and stable  Last Vitals:  Filed Vitals:   12/20/14 0603  BP: 189/96  Pulse: 67  Resp: 18    Complications: No apparent anesthesia complications

## 2014-12-20 NOTE — Discharge Instructions (Signed)
Wylene Simmer, MD [place]  Please read the following information regarding your care after surgery.  Medications  You only need a prescription for the narcotic pain medicine (ex. oxycodone, Percocet, Norco).  All of the other medicines listed below are available over the counter. X acetominophen (Tylenol) 650 mg every 4-6 hours as you need for minor pain X oxycodone as prescribed for moderate to severe pain ?   Narcotic pain medicine (ex. oxycodone, Percocet, Vicodin) will cause constipation.  To prevent this problem, take the following medicines while you are taking any pain medicine. X docusate sodium (Colace) 100 mg twice a day X senna (Senokot) 2 tablets twice a day  X To help prevent blood clots, take an aspirin (325 mg) once a day for a month after surgery.  You should also get up every hour while you are awake to move around.    Weight Bearing ? Bear weight when you are able on your operated leg or foot. ? Bear weight only on the heel of your operated foot in the post-op shoe. X Do not bear any weight on the operated leg or foot.  Cast / Splint / Dressing X Keep your splint or cast clean and dry.  Dont put anything (coat hanger, pencil, etc) down inside of it.  If it gets damp, use a hair dryer on the cool setting to dry it.  If it gets soaked, call the office to schedule an appointment for a cast change. ? Remove your dressing 3 days after surgery and cover the incisions with dry dressings.    After your dressing, cast or splint is removed; you may shower, but do not soak or scrub the wound.  Allow the water to run over it, and then gently pat it dry.  Swelling It is normal for you to have swelling where you had surgery.  To reduce swelling and pain, keep your toes above your nose for at least 3 days after surgery.  It may be necessary to keep your foot or leg elevated for several weeks.  If it hurts, it should be elevated.  Follow Up Call my office at  [phone] when you are discharged from the hospital or surgery center to schedule an appointment to be seen two weeks after surgery.  Call my office at [phone] if you develop a fever >101.5 F, nausea, vomiting, bleeding from the surgical site or severe pain.

## 2014-12-20 NOTE — Anesthesia Preprocedure Evaluation (Addendum)
Anesthesia Evaluation  Patient identified by MRN, date of birth, ID band Patient awake    Reviewed: Allergy & Precautions, NPO status , Patient's Chart, lab work & pertinent test results  Airway Mallampati: II  TM Distance: >3 FB Neck ROM: Full    Dental  (+) Teeth Intact, Dental Advisory Given   Pulmonary former smoker,  breath sounds clear to auscultation        Cardiovascular negative cardio ROS  Rhythm:Regular Rate:Normal     Neuro/Psych    GI/Hepatic Neg liver ROS, GERD-  ,  Endo/Other  negative endocrine ROS  Renal/GU negative Renal ROS     Musculoskeletal  (+) Arthritis -,   Abdominal   Peds  Hematology   Anesthesia Other Findings   Reproductive/Obstetrics                            Anesthesia Physical Anesthesia Plan  ASA: II  Anesthesia Plan: General   Post-op Pain Management:    Induction: Intravenous  Airway Management Planned: LMA  Additional Equipment:   Intra-op Plan:   Post-operative Plan: Extubation in OR  Informed Consent: I have reviewed the patients History and Physical, chart, labs and discussed the procedure including the risks, benefits and alternatives for the proposed anesthesia with the patient or authorized representative who has indicated his/her understanding and acceptance.   Dental advisory given  Plan Discussed with: CRNA and Surgeon  Anesthesia Plan Comments:         Anesthesia Quick Evaluation

## 2014-12-20 NOTE — H&P (Signed)
Mary Moss is an 79 y.o. female.   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  She has failed non operative treatment and presents today for left ankle replacement.  Past Medical History  Diagnosis Date  . Basal cell carcinoma 2015    Nose  . Ankle fracture fx 13 yrs ago, anklw swells off and on, has cramps in ankle also    left, to seee dr Doria Fern about 02-16-14  . GERD (gastroesophageal reflux disease)     OTC med  . Arthritis     Past Surgical History  Procedure Laterality Date  . Abdominal hysterectomy    . Rotator cuff repair Right 2009  . Fracture surgery Left 2001  . Knee arthroscopy Right 02/07/2014    Procedure: RIGHT ARTHROSCOPY KNEE, WITH MEDIAL MENISCAL DEBRIDEMENT AND CHONDRAPLASTY;  Surgeon: Gearlean Alf, MD;  Location: [facility];  Service: Orthopedics;  Laterality: Right;    Family History  Problem Relation Age of Onset  . Cancer Mother     colon  . Cancer Father     lung   Social History:  reports that she quit smoking about 7 years ago. Her smoking use included Cigarettes. She started smoking about 57 years ago. She has a 20 pack-year smoking history. She has never used smokeless tobacco. She reports that she does not drink alcohol or use illicit drugs.  Allergies:  Allergies  Allergen Reactions  . Polysporin [Bacitracin-Polymyxin B] Rash    Medications Prior to Admission  Medication Sig Dispense Refill  . Ibuprofen-Diphenhydramine Cit (IBUPROFEN PM PO) Take 2 tablets by mouth at bedtime.    Marland Kitchen terconazole (TERAZOL 7) 0.4 % vaginal cream Place 1 applicator vaginally at bedtime. (Patient not taking: Reported on 12/07/2014) 45 g 0    No results found for this or any previous visit (from the past 48 hour(s)). No results found.  ROS  No recent f/c/n/v/wt loss  Blood pressure 189/96, pulse 67, resp. rate 18, SpO2 96 %. Physical Exam  wn wd female in nad.  A and O x 4.  Mood  and affec tnormal.  EOMI.  Resp unlabored.  L ankle with healthy skin.  No lymphadenopathy.  2+ dp and pt pulses.  Feels LT normally about the foot.  5/5 strength in PF adn DF of the ankle.  TTP at anteromedial joint line.  NO gross deformity.  Heelcord is tight.  Assessment/Plan Post traumatic left ankle arthritis and tight heelcord.  To OR for achilles tendon lengthening and left ankle replacement.  The risks and benefits of the alternative treatment options have been discussed in detail.  The patient wishes to proceed with surgery and specifically understands risks of bleeding, infection, nerve damage, blood clots, need for additional surgery, amputation and death.   Wylene Simmer 2014/12/24, 7:10 AM

## 2014-12-20 NOTE — Brief Op Note (Signed)
12/20/2014  10:10 AM  PATIENT:  Mary Moss  79 y.o. female  PRE-OPERATIVE DIAGNOSIS:  left ankle osteoarthritis, tight heelcord  POST-OPERATIVE DIAGNOSIS:  left ankle osteoarthritis, tight heelcord  Procedure(s): 1.  LEFT TOTAL ANKLE ARTHOPLASTY 2.  PERCUTANEOUS ACHILLES TENDON LENGTHENING  SURGEON:  Wylene Simmer, MD  ASSISTANT: n/a  ANESTHESIA:   General, regional  EBL:  minimal   TOURNIQUET:   Total Tourniquet Time Documented: Thigh (Left) - 111 minutes Total: Thigh (Left) - 162 minutes  COMPLICATIONS:  None apparent  DISPOSITION:  Extubated, awake and stable to recovery.  DICTATION ID:  [id number]

## 2014-12-20 NOTE — Evaluation (Signed)
Physical Therapy Evaluation Patient Details Name: Mary Moss MRN: [id number] DOB: 07/12/1936 Today's Date: 12/20/2014   History of Present Illness  Patient is a 79 y/o female s/p total ankle arthroplasty with achilles tendon lengthening. PMH of basal cell carcinoma and GERD.    Clinical Impression  Patient presents with lethargy and balance deficits due to recent surgery and new NWB status of LLE impacting safe mobility. PTA pt very independent. Family to see if w/c will fit through doorways at home. Pt eager to use a knee walker. Will attempt next session to assess safety. Instructed pt in HEP and edema management. Would benefit from skilled PT to improve transfers, gait, balance and mobility so pt can maximize independence prior to return home.     Follow Up Recommendations Home health PT;Supervision/Assistance - 24 hour    Equipment Recommendations  None recommended by PT    Recommendations for Other Services OT consult     Precautions / Restrictions Precautions Precautions: Fall Required Braces or Orthoses: Other Brace/Splint Other Brace/Splint: hard cast. Restrictions Weight Bearing Restrictions: Yes LLE Weight Bearing: Non weight bearing      Mobility  Bed Mobility Overal bed mobility: Needs Assistance Bed Mobility: Supine to Sit     Supine to sit: Supervision;HOB elevated     General bed mobility comments: Supervision for safety due to first time getting up. Able to mobilize LLE to EOB without assist. + dizziness- resolved.  Transfers Overall transfer level: Needs assistance Equipment used: Rolling walker (2 wheeled) Transfers: Sit to/from Stand Sit to Stand: Min assist         General transfer comment: Min A to rise from EOB with cues for hand placement and anterior translation. Compliant with NWB LLE. Sa02 dropped to 85% on RA.   Ambulation/Gait Ambulation/Gait assistance: Min assist Ambulation Distance (Feet): 3 Feet Assistive device: Rolling  walker (2 wheeled) Gait Pattern/deviations: Step-to pattern;Trunk flexed   Gait velocity interpretation: Below normal speed for age/gender General Gait Details: Hopping pattern to get to chair. Compliant with WB. Cues to decrease speed, Min A for balance/safety.   Stairs            Wheelchair Mobility    Modified Rankin (Stroke Patients Only)       Balance Overall balance assessment: Needs assistance Sitting-balance support: Feet supported;No upper extremity supported Sitting balance-Leahy Scale: Good     Standing balance support: During functional activity Standing balance-Leahy Scale: Poor Standing balance comment: Relient on RW for support and for NWB status LLE.                             Pertinent Vitals/Pain Pain Assessment: No/denies pain    Home Living Family/patient expects to be discharged to:: Private residence Living Arrangements: Spouse/significant other;Children Available Help at Discharge: Family;Available 24 hours/day Type of Home: House Home Access: Ramped entrance     Home Layout: Two level;Able to live on main level with bedroom/bathroom Home Equipment: Gilford Rile - 2 wheels;Walker - standard;Cane - single point;Crutches;Bedside commode;Wheelchair - manual      Prior Function Level of Independence: Independent         Comments: Pt very active. Still works, drives.      Hand Dominance        Extremity/Trunk Assessment   Upper Extremity Assessment: Defer to OT evaluation           Lower Extremity Assessment: LLE deficits/detail   LLE Deficits / Details: Able to  perform SLR and wiggle toes.      Communication   Communication: No difficulties  Cognition Arousal/Alertness: Lethargic;Suspect due to medications Behavior During Therapy: [facility] for tasks assessed/performed Overall Cognitive Status: Within Functional Limits for tasks assessed                      General Comments      Exercises General  Exercises - Lower Extremity Ankle Circles/Pumps: Right;10 reps;Supine Quad Sets: Both;10 reps;Supine Gluteal Sets: Both;10 reps;Supine Long Arc Quad: Left;10 reps;Seated      Assessment/Plan    PT Assessment Patient needs continued PT services  PT Diagnosis Difficulty walking   PT Problem List Cardiopulmonary status limiting activity;Impaired sensation;Decreased activity tolerance;Decreased balance;Decreased mobility;Decreased knowledge of use of DME  PT Treatment Interventions Balance training;Gait training;Functional mobility training;Therapeutic activities;Therapeutic exercise;Wheelchair mobility training;Patient/family education;DME instruction   PT Goals (Current goals can be found in the Care Plan section) Acute Rehab PT Goals Patient Stated Goal: to learn to use knee walker PT Goal Formulation: With patient/family Time For Goal Achievement: 01/03/15 Potential to Achieve Goals: Fair    Frequency Min 3X/week   Barriers to discharge        Co-evaluation               End of Session Equipment Utilized During Treatment: Gait belt Activity Tolerance: Patient limited by lethargy;Patient tolerated treatment well Patient left: in chair;with call bell/phone within reach;with family/visitor present Nurse Communication: Mobility status;Weight bearing status         Time: 2119-4174 PT Time Calculation (min) (ACUTE ONLY): 28 min   Charges:   PT Evaluation $Initial PT Evaluation Tier I: 1 Procedure PT Treatments $Therapeutic Activity: 8-22 mins   PT G Codes:        Justun Anaya A Micayla Brathwaite 12/20/2014, 3:44 PM Wray Kearns, [place], DPT [phone]

## 2014-12-20 NOTE — Anesthesia Postprocedure Evaluation (Signed)
  Anesthesia Post-op Note  Patient: Mary Moss  Procedure(s) Performed: Procedure(s): LEFT TOTAL ANKLE ARTHOPLASTY WITH PERCUTANEOUS ACHILLES TENDON LENGTHENING (Left)  Patient Location: PACU  Anesthesia Type:GA combined with regional for post-op pain  Level of Consciousness: awake, alert  and oriented  Airway and Oxygen Therapy: Patient Spontanous Breathing  Post-op Pain: mild  Post-op Assessment: Post-op Vital signs reviewed  Post-op Vital Signs: stable  Last Vitals:  Filed Vitals:   12/20/14 1042  BP:   Pulse: 65  Temp:   Resp: 13    Complications: No apparent anesthesia complications

## 2014-12-20 NOTE — Anesthesia Procedure Notes (Addendum)
Anesthesia Regional Block:  Popliteal block  Pre-Anesthetic Checklist: ,, timeout performed, Correct Patient, Correct Site, Correct Laterality, Correct Procedure, Correct Position, site marked, Risks and benefits discussed,  Surgical consent,  Pre-op evaluation,  At surgeon's request and post-op pain management  Laterality: Lower and Left  Prep: chloraprep       Needles:  Injection technique: Single-shot  Needle Type: Stimulator Needle - 80     Needle Length: 9cm 9 cm Needle Gauge: 21 and 21 G  Needle insertion depth: 6 cm   Additional Needles:  Procedures: ultrasound guided (picture in chart) and nerve stimulator Popliteal block  Nerve Stimulator or Paresthesia:  Response: Twitch elicited, 0.8 mA, 0.3 ms, 6 cm  Additional Responses:   Narrative:  Start time: 12/20/2014 6:55 AM End time: 12/20/2014 7:10 AM Injection made incrementally with aspirations every 5 mL.  Performed by: Personally  Anesthesiologist: MASSAGEE, TERRY  Additional Notes: This is a lateral approach to the popliteal fossa area. A stimulator needle is used starting at 1.5 mAmp current and descending as nerve contact is made. Injection of anesthetic is in 5 ml increments with multiple neg aspirations before continuing. Total volume is 40 cc.     Procedure Name: LMA Insertion Date/Time: 12/20/2014 7:30 AM Performed by: Manus Gunning, Kristopher Attwood J Pre-anesthesia Checklist: Patient identified, Emergency Drugs available, Suction available, Patient being monitored and Timeout performed Patient Re-evaluated:Patient Re-evaluated prior to inductionOxygen Delivery Method: Circle system utilized Preoxygenation: Pre-oxygenation with 100% oxygen Intubation Type: IV induction Ventilation: Mask ventilation without difficulty LMA: LMA inserted LMA Size: 4.0 Number of attempts: 1 Placement Confirmation: positive ETCO2 and breath sounds checked- equal and bilateral Tube secured with: Tape Dental Injury: Teeth and Oropharynx as  per pre-operative assessment

## 2014-12-21 ENCOUNTER — Encounter (HOSPITAL_COMMUNITY): Payer: Self-pay | Admitting: Orthopedic Surgery

## 2014-12-21 MED ORDER — ASPIRIN EC 325 MG PO TBEC: 325.0000 mg | DELAYED_RELEASE_TABLET | Freq: Every day | ORAL | Status: DC

## 2014-12-21 MED ORDER — DOCUSATE SODIUM 100 MG PO CAPS: 100.0000 mg | ORAL_CAPSULE | Freq: Two times a day (BID) | ORAL | Status: DC

## 2014-12-21 MED ORDER — OXYCODONE HCL 5 MG PO TABS: 5.0000 mg | ORAL_TABLET | ORAL | Status: DC | PRN

## 2014-12-21 MED ORDER — SENNOSIDES 8.6 MG PO TABS: 2.0000 | ORAL_TABLET | Freq: Every day | ORAL | Status: DC

## 2014-12-21 NOTE — Progress Notes (Signed)
Utilization Review Completed.Donne Anon T5/27/2016

## 2014-12-21 NOTE — Discharge Summary (Signed)
Physician Discharge Summary  Patient ID: Mary Moss MRN: [id number] DOB/AGE: 1936/03/15 79 y.o.  Admit date: 12/20/2014 Discharge date: 12/21/2014  Admission Diagnoses:  Left ankle arthritis  Discharge Diagnoses:  Active Problems:   Post-traumatic arthritis of ankle s/p L total ankle replacement  Discharged Condition: stable  Hospital Course: Pt was admitted and taken to the OR for left total ankle replacement and achilles tendon lengthening.  She tolerated the procedure well, did well with PT and is discharged to home in stable condition.  Consults: None  Significant Diagnostic Studies: nhone  Treatments: surgery: as above  Discharge Exam: Blood pressure 147/71, pulse 94, temperature 99.4 F (37.4 C), temperature source Oral, resp. rate 18, height 5\' 9"  (1.753 m), weight 100.245 kg (221 lb), SpO2 93 %. L LE casted.  NVI at toes.  A and Ox 4.  Mood and affect normal.  Disposition: 01-Home or Self Care  Discharge Instructions    Call MD / Call 911    Complete by:  As directed   If you experience chest pain or shortness of breath, CALL 911 and be transported to the hospital emergency room.  If you develope a fever above 101 F, pus (white drainage) or increased drainage or redness at the wound, or calf pain, call your surgeon's office.     Constipation Prevention    Complete by:  As directed   Drink plenty of fluids.  Prune juice may be helpful.  You may use a stool softener, such as Colace (over the counter) 100 mg twice a day.  Use MiraLax (over the counter) for constipation as needed.     Diet - low sodium heart healthy    Complete by:  As directed      Increase activity slowly as tolerated    Complete by:  As directed      Non weight bearing    Complete by:  As directed   Laterality:  left  Extremity:  Lower            Medication List    TAKE these medications        aspirin EC 325 MG tablet  Take 1 tablet (325 mg total) by mouth daily.     docusate  sodium 100 MG capsule  Commonly known as:  COLACE  Take 1 capsule (100 mg total) by mouth 2 (two) times daily. While taking narcotic pain medicine.     IBUPROFEN PM PO  Take 2 tablets by mouth at bedtime.     oxyCODONE 5 MG immediate release tablet  Commonly known as:  ROXICODONE  Take 1-2 tablets (5-10 mg total) by mouth every 4 (four) hours as needed for moderate pain or severe pain.     senna 8.6 MG tablet  Commonly known as:  SENOKOT  Take 2 tablets (17.2 mg total) by mouth daily. While taking narcotic pain medicine.     terconazole 0.4 % vaginal cream  Commonly known as:  TERAZOL 7  Place 1 applicator vaginally at bedtime.           Follow-up Information    Follow up with Wylene Simmer, MD In 3 weeks.   Specialty:  Orthopedic Surgery   Contact information:   [address] [place] [address] [phone]       Signed: Wylene Simmer 12/21/2014, 7:37 AM

## 2014-12-21 NOTE — Care Management Note (Signed)
Case Management Note  Patient Details  Name: KAYBREE WILLIAMS MRN: [id number] Date of Birth: [birth year]  Subjective/Objective:    Arthritis of right ankle, s/p right total ankle arthroplasty                Action/Plan:  Spoke with patient about HHC, she chose [facility]. Contacted Miranda at [facility] and set up [place]. Patient states that she has a rolling walker and BSC at home. No equipment needs identified by therapy or patient. Patient states that she will have family available to assist after discharge.     Expected Discharge Date:                  Expected Discharge Plan:  [place]  In-House Referral:     Discharge planning Services  CM Consult  Post Acute Care Choice:  Home Health Choice offered to:  Patient  DME Arranged:    DME Agency:     HH Arranged:  PT [place]:  Welcome  Status of Service:  Completed, signed off  Medicare Important Message Given:  N/A - LOS <3 / Initial given by admissions Date Medicare IM Given:    Medicare IM give by:    Date Additional Medicare IM Given:    Additional Medicare Important Message give by:     If discussed at [place] of Stay Meetings, dates discussed:    Additional Comments:  Nila Nephew, RN 12/21/2014, 10:54 AM

## 2014-12-21 NOTE — Progress Notes (Signed)
Physical Therapy Treatment Patient Details Name: Mary Moss MRN: [id number] DOB: 03-16-1936 Today's Date: 12/21/2014    History of Present Illness Patient is a 79 y/o female s/p total ankle arthroplasty with achilles tendon lengthening. PMH of basal cell carcinoma and GERD.    PT Comments    Patient progressing well towards PT goals. Ambulating short distances with Min guard assist for safety. Fatigues easily. Education provided on energy conservation technique. Attempted knee walker however unable to safely place LLE on seat due to pain and balance deficits. Encouraged use of RW and w/c at home for mobility. Daughter present during education as she will be assisting at home. Reviewed HEP. Will continue to follow to maximize independence and mobility.   Follow Up Recommendations  Home health PT;Supervision/Assistance - 24 hour     Equipment Recommendations  None recommended by PT    Recommendations for Other Services       Precautions / Restrictions Precautions Precautions: Fall Required Braces or Orthoses: Other Brace/Splint Other Brace/Splint: hard cast. Restrictions Weight Bearing Restrictions: Yes LLE Weight Bearing: Non weight bearing    Mobility  Bed Mobility               General bed mobility comments: Sitting in chair upon PT arrival.   Transfers Overall transfer level: Needs assistance Equipment used: Rolling walker (2 wheeled) Transfers: Sit to/from Stand Sit to Stand: Min assist         General transfer comment: Min A to rise from EOB with cues for hand placement and anterior translation. Multiple attempts to stand from low chair however unable without assist. Compliant with NWB LLE. Stood from Automotive engineer.   Ambulation/Gait Ambulation/Gait assistance: Min guard Ambulation Distance (Feet): 20 Feet (+15') Assistive device: Rolling walker (2 wheeled) Gait Pattern/deviations: Step-to pattern;Trunk flexed   Gait velocity interpretation: Below  normal speed for age/gender General Gait Details: Hop to gait pattern with cues to take longer hops for energy conservation. Min guard for safety. Fatigues easily.  1 seated rest break between bouts. Attempted knee walker however pt unable to place LLE on seat due to pain from hard cast and balance issues.   Stairs            Wheelchair Mobility    Modified Rankin (Stroke Patients Only)       Balance Overall balance assessment: Needs assistance Sitting-balance support: Feet supported;No upper extremity supported Sitting balance-Leahy Scale: Good     Standing balance support: During functional activity Standing balance-Leahy Scale: Poor                      Cognition Arousal/Alertness: Awake/alert Behavior During Therapy: WFL for tasks assessed/performed Overall Cognitive Status: Within Functional Limits for tasks assessed                      Exercises General Exercises - Lower Extremity Ankle Circles/Pumps: Both;15 reps;Supine Quad Sets: Both;10 reps;Seated Gluteal Sets: Both;10 reps;Seated Long Arc Quad: Left;5 reps;Seated    General Comments General comments (skin integrity, edema, etc.): Pt's daughter in room during PT treatment. Education on how to safely guard pt at home during mobility. Encouraged w/c follow during ambulation bouts.       Pertinent Vitals/Pain Pain Assessment: Faces Faces Pain Scale: Hurts even more Pain Location: left ankle Pain Descriptors / Indicators: Sore;Aching Pain Intervention(s): Limited activity within patient's tolerance;Monitored during session;Repositioned    Home Living  Prior Function            PT Goals (current goals can now be found in the care plan section) Progress towards PT goals: Progressing toward goals    Frequency  Min 3X/week    PT Plan Current plan remains appropriate    Co-evaluation             End of Session Equipment Utilized During Treatment:  Gait belt Activity Tolerance: Patient tolerated treatment well;Patient limited by pain Patient left: in chair;with call bell/phone within reach;with family/visitor present     Time: 1040-1103 PT Time Calculation (min) (ACUTE ONLY): 23 min  Charges:  $Gait Training: 23-37 mins                    G Codes:      Kera Deacon A Rohit Deloria 12/21/2014, 11:59 AM  Wray Kearns, [place], DPT [phone]

## 2014-12-21 NOTE — Op Note (Signed)
[name], Moss NO.:  [id number]  MEDICAL RECORD NO.:  [id number]  LOCATION:  5N20C                        FACILITY:  [facility]  PHYSICIAN:  Wylene Simmer, MD        DATE OF BIRTH:  [birth year]  DATE OF PROCEDURE:  12/20/2014 DATE OF DISCHARGE:                              OPERATIVE REPORT   PREOPERATIVE DIAGNOSES: 1. Left ankle posttraumatic arthritis. 2. Left tight heel cord.  POSTOPERATIVE DIAGNOSES: 1. Left ankle posttraumatic arthritis. 2. Left tight heel cord.  PROCEDURES: 1. Left percutaneous Achilles tendon lengthening. 2. Left total ankle arthroplasty through a separate incision.  SURGEON:  Wylene Simmer, MD  ANESTHESIA:  General, regional.  ESTIMATED BLOOD LOSS:  Minimal.  TOURNIQUET TIME:  111 minutes at 300 mmHg.  COMPLICATIONS:  None apparent.  DISPOSITION:  Extubated, awake and stable to recovery.  INDICATIONS FOR PROCEDURE:  The patient is a 79 year old woman with a history of left ankle fracture about 15 years ago.  She has gone on to develop posttraumatic arthritis of the left ankle.  She has failed nonoperative treatment to date including activity modification, oral anti-inflammatories, bracing and injections.  She presents now for operative treatment of this painful and limiting condition.  She understands the risks and benefits of the alternative treatment options and elects surgical treatment.  She specifically understands risks of bleeding, infection, nerve damage, blood clots, need for additional surgery, continued pain, amputation, and death.  PROCEDURE IN DETAIL:  After preoperative consent was obtained and the correct operative site was identified, the patient was brought to the operating room and placed supine on the operating table.  General anesthesia was induced.  Preoperative antibiotics were administered. Surgical time-out was taken.  Left lower extremity was prepped and draped in standard sterile fashion with  tourniquet around the thigh. The extremity was exsanguinated and the tourniquet was inflated to 300 mmHg.  An anterior incision was made, and sharp dissection was carried down through the skin and subcutaneous tissue.  The extensor retinaculum was incised over the extensor hallucis longus tendon.  It was released proximally and distally.  The interval between the tibialis anterior and EHL were then developed and blunt dissection was carried down to the anterior aspect of the tibia.  The neurovascular bundle was mobilized and retracted laterally.  It was protected throughout the case.  The anterior joint capsule was incised and elevated medially and laterally. Anterior osteophytes were resected from the joint line.  A 0.25-inch osteotome was inserted into the medial gutter.  The guidepin for the external alignment guide was then inserted percutaneously at the level of the tibial tubercle in line with this osteotome.  The external cutting guide was then attached and rotation was set using the T-handle, in line with the medial gutter.  Alignment with the tibia was set in the anterior and lateral planes using the C-arm.  The slope was set perpendicular to the tibial shaft.  The resection height was set using the [facility] guide and the blocks were pinned into place.  An AP radiograph was then obtained and the medial-lateral alignment was set, in line with the medial gutter.  Cutting block was then pinned into place and  the distal tibial cut was made with the oscillating saw.  The cutting guide was removed and the cut bone was removed.  The talar cutting guide was then attached and the superior aspect of the talus cut after fluoroscopic images confirmed appropriate position of the guide. The talus was then sized and a small datum was applied and pinned into position.  Anterior, posterior, medial and lateral cuts were then made and all bone removed.  The window trial was applied, was noted to  fit appropriately.  It was pinned into position.  The keel hole was drilled and punched.  The wound was then irrigated copiously.  A size small talar component was inserted and impacted into position.  It was noted to fit appropriately.  The tibial cut was measured and a size large tibial trial was inserted.  The ankle joint was noted to be too tight to allow even smallest trial poly to fit.  The tibial trial was removed and the distal tibial cut was made again approximately 4 mm proximal from the first cut.  The trial was then inserted and a trial poly was inserted without difficulty.  AP and lateral radiographs confirmed appropriate position of all implants.  The size large trial was pinned into position and the barrel holes were drilled and punched.  The wound was irrigated copiously.  Vancomycin powder was sprinkled into the wound at the level of the cut surface of bone.  This had been done for the talar component as well.  The size large tibial component was inserted and impacted into position.  AP and lateral radiographs confirmed appropriate position.  A 9-mm trial poly was inserted.  The ligament balance was noted to be appropriate and range of motion also was noted to be appropriate.  Trial poly was removed and the wound was irrigated copiously.  More vancomycin was sprinkled into the joint and the 9-mm polyethylene spacer was inserted.  Final AP, mortise, and lateral radiographs confirmed appropriate position and length of all hardware and appropriate thickness of the poly spacer.  Wound was then again irrigated and the anterior joint capsule was repaired with 0 Vicryl. Vancomycin powder was then inserted over the subcutaneous tissues.  The extensor retinaculum was repaired with simple sutures of 0 Vicryl. Subcutaneous tissues were approximated with Monocryl and the skin was closed with a running 3-0 nylon.  Sterile dressings were applied followed by well-padded, short-leg cast.   The proximal incision was closed with nylon after removing the guidepin.  Tourniquet was released after application of dressings at 1 hour and 51 minutes.  The patient was awakened from anesthesia and transported to the recovery room in stable condition.  Prior to the anterior approach of the ankle, a triple hemisection percutaneous Achilles tendon lengthening was performed with a #15 blade allowing 15 degrees of dorsiflexion of the ankle passively.  FOLLOWUP PLAN:  The patient will be nonweightbearing on the left lower extremity.  She will have physical therapy as an inpatient and we will plan to discharge tomorrow with home health PT.     Wylene Simmer, MD     JH/MEDQ  D:  12/20/2014  T:  12/21/2014  Job:  [id number]

## 2014-12-22 DIAGNOSIS — Z87891 Personal history of nicotine dependence: Secondary | ICD-10-CM | POA: Diagnosis not present

## 2014-12-22 DIAGNOSIS — Z471 Aftercare following joint replacement surgery: Secondary | ICD-10-CM | POA: Diagnosis not present

## 2014-12-22 DIAGNOSIS — K219 Gastro-esophageal reflux disease without esophagitis: Secondary | ICD-10-CM | POA: Diagnosis not present

## 2014-12-22 DIAGNOSIS — Z96662 Presence of left artificial ankle joint: Secondary | ICD-10-CM | POA: Diagnosis not present

## 2014-12-22 DIAGNOSIS — M150 Primary generalized (osteo)arthritis: Secondary | ICD-10-CM | POA: Diagnosis not present

## 2014-12-25 DIAGNOSIS — Z87891 Personal history of nicotine dependence: Secondary | ICD-10-CM | POA: Diagnosis not present

## 2014-12-25 DIAGNOSIS — Z471 Aftercare following joint replacement surgery: Secondary | ICD-10-CM | POA: Diagnosis not present

## 2014-12-25 DIAGNOSIS — K219 Gastro-esophageal reflux disease without esophagitis: Secondary | ICD-10-CM | POA: Diagnosis not present

## 2014-12-25 DIAGNOSIS — Z96662 Presence of left artificial ankle joint: Secondary | ICD-10-CM | POA: Diagnosis not present

## 2014-12-25 DIAGNOSIS — M150 Primary generalized (osteo)arthritis: Secondary | ICD-10-CM | POA: Diagnosis not present

## 2014-12-27 DIAGNOSIS — K219 Gastro-esophageal reflux disease without esophagitis: Secondary | ICD-10-CM | POA: Diagnosis not present

## 2014-12-27 DIAGNOSIS — M150 Primary generalized (osteo)arthritis: Secondary | ICD-10-CM | POA: Diagnosis not present

## 2014-12-27 DIAGNOSIS — Z87891 Personal history of nicotine dependence: Secondary | ICD-10-CM | POA: Diagnosis not present

## 2014-12-27 DIAGNOSIS — Z96662 Presence of left artificial ankle joint: Secondary | ICD-10-CM | POA: Diagnosis not present

## 2014-12-27 DIAGNOSIS — Z471 Aftercare following joint replacement surgery: Secondary | ICD-10-CM | POA: Diagnosis not present

## 2015-01-01 DIAGNOSIS — Z87891 Personal history of nicotine dependence: Secondary | ICD-10-CM | POA: Diagnosis not present

## 2015-01-01 DIAGNOSIS — K219 Gastro-esophageal reflux disease without esophagitis: Secondary | ICD-10-CM | POA: Diagnosis not present

## 2015-01-01 DIAGNOSIS — M150 Primary generalized (osteo)arthritis: Secondary | ICD-10-CM | POA: Diagnosis not present

## 2015-01-01 DIAGNOSIS — Z471 Aftercare following joint replacement surgery: Secondary | ICD-10-CM | POA: Diagnosis not present

## 2015-01-01 DIAGNOSIS — Z96662 Presence of left artificial ankle joint: Secondary | ICD-10-CM | POA: Diagnosis not present

## 2015-01-02 DIAGNOSIS — M19172 Post-traumatic osteoarthritis, left ankle and foot: Secondary | ICD-10-CM | POA: Diagnosis not present

## 2015-01-02 DIAGNOSIS — Z4789 Encounter for other orthopedic aftercare: Secondary | ICD-10-CM | POA: Diagnosis not present

## 2015-01-16 DIAGNOSIS — Z4789 Encounter for other orthopedic aftercare: Secondary | ICD-10-CM | POA: Diagnosis not present

## 2015-01-16 DIAGNOSIS — M19072 Primary osteoarthritis, left ankle and foot: Secondary | ICD-10-CM | POA: Diagnosis not present

## 2015-01-16 DIAGNOSIS — Z96662 Presence of left artificial ankle joint: Secondary | ICD-10-CM | POA: Diagnosis not present

## 2015-01-23 DIAGNOSIS — M19072 Primary osteoarthritis, left ankle and foot: Secondary | ICD-10-CM | POA: Diagnosis not present

## 2015-01-23 DIAGNOSIS — Z96662 Presence of left artificial ankle joint: Secondary | ICD-10-CM | POA: Diagnosis not present

## 2015-01-23 DIAGNOSIS — Z471 Aftercare following joint replacement surgery: Secondary | ICD-10-CM | POA: Diagnosis not present

## 2015-02-05 DIAGNOSIS — D0439 Carcinoma in situ of skin of other parts of face: Secondary | ICD-10-CM | POA: Diagnosis not present

## 2015-02-05 DIAGNOSIS — Z85828 Personal history of other malignant neoplasm of skin: Secondary | ICD-10-CM | POA: Diagnosis not present

## 2015-02-05 DIAGNOSIS — L570 Actinic keratosis: Secondary | ICD-10-CM | POA: Diagnosis not present

## 2015-02-05 DIAGNOSIS — L218 Other seborrheic dermatitis: Secondary | ICD-10-CM | POA: Diagnosis not present

## 2015-02-13 DIAGNOSIS — Z96662 Presence of left artificial ankle joint: Secondary | ICD-10-CM | POA: Diagnosis not present

## 2015-02-13 DIAGNOSIS — Z471 Aftercare following joint replacement surgery: Secondary | ICD-10-CM | POA: Diagnosis not present

## 2015-02-22 ENCOUNTER — Ambulatory Visit: Payer: 59 | Admitting: Physical Therapy

## 2015-02-26 ENCOUNTER — Ambulatory Visit: Payer: Medicare Other | Attending: Orthopedic Surgery | Admitting: Physical Therapy

## 2015-02-26 DIAGNOSIS — M25672 Stiffness of left ankle, not elsewhere classified: Secondary | ICD-10-CM

## 2015-02-26 DIAGNOSIS — R269 Unspecified abnormalities of gait and mobility: Secondary | ICD-10-CM | POA: Insufficient documentation

## 2015-02-26 NOTE — Therapy (Signed)
[facility] [place], [place], [address] Phone: [phone]   Fax:  [phone]  Physical Therapy Evaluation  Patient Details  Name: Mary Moss MRN: [id number] Date of Birth: 02/23/1936 Referring Provider:  Wylene Simmer, MD  Encounter Date: 02/26/2015      PT End of Session - 02/26/15 1352    Visit Number 1   Number of Visits 12   Date for PT Re-Evaluation 04/09/15   PT Start Time 1115   Activity Tolerance Patient tolerated treatment well   Behavior During Therapy [facility] for tasks assessed/performed      Past Medical History  Diagnosis Date  . Basal cell carcinoma 2015    Nose  . Ankle fracture fx 13 yrs ago, anklw swells off and on, has cramps in ankle also    left, to seee dr hewitt about 02-16-14  . GERD (gastroesophageal reflux disease)     OTC med  . Arthritis     Past Surgical History  Procedure Laterality Date  . Abdominal hysterectomy    . Rotator cuff repair Right 2009  . Fracture surgery Left 2001  . Knee arthroscopy Right 02/07/2014    Procedure: RIGHT ARTHROSCOPY KNEE, WITH MEDIAL MENISCAL DEBRIDEMENT AND CHONDRAPLASTY;  Surgeon: Gearlean Alf, MD;  Location: [facility];  Service: Orthopedics;  Laterality: Right;  . Total ankle arthroplasty Left 12/20/2014    Procedure: LEFT TOTAL ANKLE ARTHOPLASTY WITH PERCUTANEOUS ACHILLES TENDON LENGTHENING;  Surgeon: Wylene Simmer, MD;  Location: [facility];  Service: Orthopedics;  Laterality: Left;    There were no vitals filed for this visit.  Visit Diagnosis:  Ankle stiffness, left - Plan: PT plan of care cert/re-cert  Abnormality of gait - Plan: PT plan of care cert/re-cert      Subjective Assessment - 02/26/15 1552    Subjective I'm doing great and have no pain.   Patient Stated Goals Improve range of motion and strength of right ankle.            [facility] PT Assessment - 02/26/15 0001    Assessment   Medical Diagnosis Left total ankle replacement.   Onset Date/Surgical Date --  11/30/14.   Precautions   Precautions None   Restrictions   Weight Bearing Restrictions No   Balance Screen   Has the patient fallen in the past 6 months No   Has the patient had a decrease in activity level because of a fear of falling?  No   Is the patient reluctant to leave their home because of a fear of falling?  No   Home [employer] residence   Prior Function   Level of Independence Independent   Cognition   Overall Cognitive Status Within Functional Limits for tasks assessed   Observation/Other Assessments-Edema    Edema Circumferential   Circumferential Edema   Circumferential - Left  i-malleolr left 3 cms > right.   ROM / Strength   AROM / PROM / Strength AROM;Strength   AROM   Overall AROM Comments Active dorsiflexion with left knee flexed and extended= 0 degrees.   Strength   Overall Strength Comments Tested isometerically in neutral= 4/5.   Palpation   Palpation comment No significant palpable left ankle tenderness.   Ambulation/Gait   Gait Comments Flat-footed gait pattern due to loss of left ankle dorsiflexion.                   [facility] Adult PT Treatment/Exercise - 02/26/15 0001  Modalities   Modalities Vasopneumatic   Vasopneumatic   Number Minutes Vasopneumatic  15 minutes   Vasopnuematic Location  Ankle   Vasopneumatic Pressure Medium                  PT Short Term Goals - 07-Mar-2015 1426    PT SHORT TERM GOAL #1   Title Ind with                   Plan - 03-07-2015 1354    Clinical Impression Statement The patient underwent a left total ankle replacement on 12/20/14.  She presents to the clinic today with normal footwear and wbat over her left LE.  She c/o ankle swelling but has been informed that this can continue for    PT Frequency 2x / week   PT Duration 4 weeks   PT Treatment/Interventions ADLs/Self Care Home Management;Cryotherapy;Vasopneumatic Device;Manual  techniques;Therapeutic exercise;Neuromuscular re-education;Passive range of motion   PT Next Visit Plan Nustep for ankle range of motion.  T-band strengthening and ankle isolator.  Passive stretching to increase left ankle dorsiflexion.  Bilateral rockerbaord okay in parallel bars with bilateral UE support to minimize load on left ankle.          G-Codes - 03/07/2015 1352    Functional Assessment Tool Used linical judgment.   Functional Limitation Mobility: Walking and moving around   Mobility: Walking and Moving Around Current Status [phone]) At least 40 percent but less than 60 percent impaired, limited or restricted   Mobility: Walking and Moving Around Goal Status [phone]) At least 20 percent but less than 40 percent impaired, limited or restricted       Problem List Patient Active Problem List   Diagnosis Date Noted  . Post-traumatic arthritis of ankle 12/20/2014  . Acute medial meniscal tear 02/06/2014  . Low bone mass 12/14/2012  . GAD (generalized anxiety disorder) 12/14/2012  . Unspecified vitamin D deficiency 12/14/2012    Cheston Coury, [place] MPT 2015-03-07, 4:10 PM  [facility] [address] [place], [place], [address] Phone: [phone]   Fax:  [phone]

## 2015-02-28 ENCOUNTER — Ambulatory Visit: Payer: Medicare Other | Admitting: Physical Therapy

## 2015-02-28 ENCOUNTER — Encounter: Payer: Self-pay | Admitting: Physical Therapy

## 2015-02-28 DIAGNOSIS — M25672 Stiffness of left ankle, not elsewhere classified: Secondary | ICD-10-CM | POA: Diagnosis not present

## 2015-02-28 DIAGNOSIS — R269 Unspecified abnormalities of gait and mobility: Secondary | ICD-10-CM | POA: Diagnosis not present

## 2015-02-28 NOTE — Therapy (Addendum)
[facility] [place], [place], [address] Phone: [phone]   Fax:  [phone]  Physical Therapy Treatment  Patient Details  Name: Mary Moss MRN: [id number] Date of Birth: May 26, 1936 Referring Provider:  Chevis Pretty, *  Encounter Date: 02/28/2015      PT End of Session - 02/28/15 0821    Visit Number 2   Number of Visits 12   Date for PT Re-Evaluation 04/09/15   PT Start Time 0819   PT Stop Time 0911   PT Time Calculation (min) 52 min   Activity Tolerance Patient tolerated treatment well   Behavior During Therapy [facility] for tasks assessed/performed      Past Medical History  Diagnosis Date  . Basal cell carcinoma 2015    Nose  . Ankle fracture fx 13 yrs ago, anklw swells off and on, has cramps in ankle also    left, to seee dr hewitt about 02-16-14  . GERD (gastroesophageal reflux disease)     OTC med  . Arthritis     Past Surgical History  Procedure Laterality Date  . Abdominal hysterectomy    . Rotator cuff repair Right 2009  . Fracture surgery Left 2001  . Knee arthroscopy Right 02/07/2014    Procedure: RIGHT ARTHROSCOPY KNEE, WITH MEDIAL MENISCAL DEBRIDEMENT AND CHONDRAPLASTY;  Surgeon: Gearlean Alf, MD;  Location: [facility];  Service: Orthopedics;  Laterality: Right;  . Total ankle arthroplasty Left 12/20/2014    Procedure: LEFT TOTAL ANKLE ARTHOPLASTY WITH PERCUTANEOUS ACHILLES TENDON LENGTHENING;  Surgeon: Wylene Simmer, MD;  Location: [facility];  Service: Orthopedics;  Laterality: Left;    There were no vitals filed for this visit.  Visit Diagnosis:  Ankle stiffness, left  Abnormality of gait      Subjective Assessment - 02/28/15 0821    Subjective Reports no pain only soreness. Has been 2 months from surgery according to patient.   Patient Stated Goals Improve range of motion and strength of right ankle.   Currently in Pain? No/denies            [facility] PT Assessment - 02/28/15  0001    Assessment   Medical Diagnosis Left total ankle replacement.   Onset Date/Surgical Date 11/30/14   Next MD Visit 03/2015                     [facility] Adult PT Treatment/Exercise - 02/28/15 0001    Exercises   Exercises Ankle   Modalities   Modalities Vasopneumatic   Vasopneumatic   Number Minutes Vasopneumatic  15 minutes   Vasopnuematic Location  Ankle   Vasopneumatic Pressure Medium   Vasopneumatic Temperature  47   Ankle Exercises: Aerobic   Stationary Bike NuStep L4 x13 min   Ankle Exercises: Standing   Rocker Board 3 minutes   Ankle Exercises: Seated   BAPS Sitting;Level 2  x5 min for proprioception in DF/PF, circles   Other Seated Ankle Exercises L ankle 4 way strengthening x30 reps yellow band   Other Seated Ankle Exercises L ankle dynadisc x30 reps Df/PF, Inv/Ev             Balance Exercises - 02/28/15 0857    Balance Exercises: Standing   Other Standing Exercises DLS on inverted BOSU x3 min with no UE support x3 min           PT Education - 02/28/15 0901    Education provided Yes   Education Details HEP- 4 way ankle  strengthening with yellow theraband that patient has from previous therapy.   Person(s) Educated Patient   Methods Explanation;Demonstration;Verbal cues;Handout   Comprehension Verbalized understanding;Returned demonstration;Verbal cues required          PT Short Term Goals - 02/26/15 1426    PT SHORT TERM GOAL #1   Title Ind with                   Plan - 02/28/15 0902    Clinical Impression Statement Patient tolerated treatment well today without complaint of increased pain. Required minimal to moderate verbal cueing and demonstration for correct form and technique of exercises. Continues to demonstrate moderate L ankle swelling which MD told her was to be expected. Demonstrates slightly diminished balance in DLS on inverted BOSU for balance training but tolerated exercise well. Accepted ankle strengthening  HEP without question and said she had yellow theraband from previous PT. Normal modalites response noted following removal of the modaltiies. Denied pain following treatment.   PT Frequency 2x / week   PT Duration 4 weeks   PT Treatment/Interventions ADLs/Self Care Home Management;Cryotherapy;Vasopneumatic Device;Manual techniques;Therapeutic exercise;Neuromuscular re-education;Passive range of motion   PT Next Visit Plan Nustep for ankle range of motion.  T-band strengthening and ankle isolator.  Passive stretching to increase left ankle dorsiflexion.  Bilateral rockerbaord okay in parallel bars with bilateral UE support to minimize load on left ankle.   Consulted and Agree with Plan of Care Patient        Problem List Patient Active Problem List   Diagnosis Date Noted  . Post-traumatic arthritis of ankle 12/20/2014  . Acute medial meniscal tear 02/06/2014  . Low bone mass 12/14/2012  . GAD (generalized anxiety disorder) 12/14/2012  . Unspecified vitamin D deficiency 12/14/2012    Wynelle Fanny, PTA 02/28/2015, 9:14 AM  [facility] [address] [place], [place], [address] Phone: [phone]   Fax:  [phone]

## 2015-02-28 NOTE — Patient Instructions (Signed)
Dorsiflexion: Resisted   Facing anchor, tubing around left foot, pull toward face.  Repeat _10___ times per set. Do __3__ sets per session. Do _2-3___ sessions per day.  http://orth.exer.us/8   Copyright  VHI. All rights reserved.  Plantar Flexion: Resisted   Anchor behind, tubing around left foot, press down. Repeat _10___ times per set. Do _3___ sets per session. Do _2-3___ sessions per day.  http://orth.exer.us/10   Copyright  VHI. All rights reserved.  Inversion: Resisted   Cross legs with right leg underneath, foot in tubing loop. Hold tubing around other foot to resist and turn foot in. Repeat _10___ times per set. Do __3__ sets per session. Do _2-3___ sessions per day.  http://orth.exer.us/12   Copyright  VHI. All rights reserved.  Eversion: Resisted   With right foot in tubing loop, hold tubing around other foot to resist and turn foot out. Repeat _10___ times per set. Do __3__ sets per session. Do _2-3___ sessions per day.  http://orth.exer.us/14   Copyright  VHI. All rights reserved.

## 2015-03-05 ENCOUNTER — Encounter: Payer: Self-pay | Admitting: Physical Therapy

## 2015-03-05 ENCOUNTER — Ambulatory Visit: Payer: Medicare Other | Admitting: Physical Therapy

## 2015-03-05 DIAGNOSIS — M25672 Stiffness of left ankle, not elsewhere classified: Secondary | ICD-10-CM | POA: Diagnosis not present

## 2015-03-05 DIAGNOSIS — R269 Unspecified abnormalities of gait and mobility: Secondary | ICD-10-CM

## 2015-03-05 NOTE — Therapy (Signed)
[facility] [place], [place], [address] Phone: [phone]   Fax:  [phone]  Physical Therapy Treatment  Patient Details  Name: Mary Moss MRN: [id number] Date of Birth: July 25, 1936 Referring Provider:  Wylene Simmer, MD  Encounter Date: 03/05/2015      PT End of Session - 03/05/15 0845    Visit Number 3   Number of Visits 12   Date for PT Re-Evaluation 04/09/15   PT Start Time 0814   PT Stop Time 0910   PT Time Calculation (min) 56 min   Activity Tolerance Patient tolerated treatment well   Behavior During Therapy [facility] for tasks assessed/performed      Past Medical History  Diagnosis Date  . Basal cell carcinoma 2015    Nose  . Ankle fracture fx 13 yrs ago, anklw swells off and on, has cramps in ankle also    left, to seee dr hewitt about 02-16-14  . GERD (gastroesophageal reflux disease)     OTC med  . Arthritis     Past Surgical History  Procedure Laterality Date  . Abdominal hysterectomy    . Rotator cuff repair Right 2009  . Fracture surgery Left 2001  . Knee arthroscopy Right 02/07/2014    Procedure: RIGHT ARTHROSCOPY KNEE, WITH MEDIAL MENISCAL DEBRIDEMENT AND CHONDRAPLASTY;  Surgeon: Gearlean Alf, MD;  Location: [facility];  Service: Orthopedics;  Laterality: Right;  . Total ankle arthroplasty Left 12/20/2014    Procedure: LEFT TOTAL ANKLE ARTHOPLASTY WITH PERCUTANEOUS ACHILLES TENDON LENGTHENING;  Surgeon: Wylene Simmer, MD;  Location: [facility];  Service: Orthopedics;  Laterality: Left;    There were no vitals filed for this visit.  Visit Diagnosis:  Ankle stiffness, left  Abnormality of gait      Subjective Assessment - 03/05/15 0831    Subjective Reports no pain only soreness. Has been 2 months from surgery according to patient.   Patient Stated Goals Improve range of motion and strength of right ankle.   Currently in Pain? No/denies            [facility] PT Assessment - 03/05/15 0001    AROM   Overall AROM  Deficits   Overall AROM Comments AROM 1 degree DF                     [facility] Adult PT Treatment/Exercise - 03/05/15 0001    Modalities   Modalities Vasopneumatic   Vasopneumatic   Number Minutes Vasopneumatic  15 minutes   Vasopnuematic Location  Ankle   Vasopneumatic Pressure Medium   Ankle Exercises: Aerobic   Stationary Bike NuStep L5 x15 min   Ankle Exercises: Standing   Rocker Board 3 minutes   Other Standing Ankle Exercises 6" step ups 3x10   Ankle Exercises: Stretches   Other Stretch prostetch x95min   Ankle Exercises: Seated   Other Seated Ankle Exercises L ankle 4 way strengthening x30 reps yellow band   Other Seated Ankle Exercises ankle isolator 1# DF/circles 3 x10                  PT Short Term Goals - 02/26/15 1426    PT SHORT TERM GOAL #1   Title Ind with            PT Long Term Goals - 03/05/15 0847    PT LONG TERM GOAL #1   Title Ind with HEP.   Time 4   Period Weeks   Status On-going  PT LONG TERM GOAL #2   Title Active dorsiflexion to 6 degrees to normalize gait pattern.   Time 4   Period Weeks   Status On-going               Plan - 03/05/15 0901    Clinical Impression Statement Patient progressing with all activities. Patient reported doing self stretches and the yellow t-band exercises at home and continues to have no pain only a slight burning soreness medial left ankle. Patient improved AROM DF to 1 degree today. Goals ongoing due to ROM deficits.   PT Frequency 2x / week   PT Duration 4 weeks   PT Treatment/Interventions ADLs/Self Care Home Management;Cryotherapy;Vasopneumatic Device;Manual techniques;Therapeutic exercise;Neuromuscular re-education;Passive range of motion   PT Next Visit Plan Nustep for ankle range of motion.  T-band strengthening and ankle isolator.  Passive stretching to increase left ankle dorsiflexion.  Bilateral rockerbaord okay in parallel bars with bilateral UE support  to minimize load on left ankle.   Consulted and Agree with Plan of Care Patient        Problem List Patient Active Problem List   Diagnosis Date Noted  . Post-traumatic arthritis of ankle 12/20/2014  . Acute medial meniscal tear 02/06/2014  . Low bone mass 12/14/2012  . GAD (generalized anxiety disorder) 12/14/2012  . Unspecified vitamin D deficiency 12/14/2012    Sheron Tallman P, PTA 03/05/2015, 9:11 AM  [facility] [place], [place], [address] Phone: [phone]   Fax:  [phone]

## 2015-03-06 ENCOUNTER — Other Ambulatory Visit: Payer: Self-pay | Admitting: Family

## 2015-03-06 DIAGNOSIS — Z780 Asymptomatic menopausal state: Secondary | ICD-10-CM

## 2015-03-07 ENCOUNTER — Ambulatory Visit: Payer: Medicare Other | Admitting: Physical Therapy

## 2015-03-07 ENCOUNTER — Encounter: Payer: Self-pay | Admitting: Physical Therapy

## 2015-03-07 DIAGNOSIS — R269 Unspecified abnormalities of gait and mobility: Secondary | ICD-10-CM

## 2015-03-07 DIAGNOSIS — M25672 Stiffness of left ankle, not elsewhere classified: Secondary | ICD-10-CM

## 2015-03-07 NOTE — Therapy (Signed)
[facility] [place], [place], [address] Phone: [phone]   Fax:  [phone]  Physical Therapy Treatment  Patient Details  Name: Mary Moss MRN: [id number] Date of Birth: 11-Feb-1936 Referring Provider:  Wylene Simmer, MD  Encounter Date: 03/07/2015      PT End of Session - 03/07/15 0750    Visit Number 4   Number of Visits 12   Date for PT Re-Evaluation 04/09/15   PT Start Time 0728   PT Stop Time 0826   PT Time Calculation (min) 58 min   Activity Tolerance Patient tolerated treatment well   Behavior During Therapy [facility] for tasks assessed/performed      Past Medical History  Diagnosis Date  . Basal cell carcinoma 2015    Nose  . Ankle fracture fx 13 yrs ago, anklw swells off and on, has cramps in ankle also    left, to seee dr hewitt about 02-16-14  . GERD (gastroesophageal reflux disease)     OTC med  . Arthritis     Past Surgical History  Procedure Laterality Date  . Abdominal hysterectomy    . Rotator cuff repair Right 2009  . Fracture surgery Left 2001  . Knee arthroscopy Right 02/07/2014    Procedure: RIGHT ARTHROSCOPY KNEE, WITH MEDIAL MENISCAL DEBRIDEMENT AND CHONDRAPLASTY;  Surgeon: Gearlean Alf, MD;  Location: [facility];  Service: Orthopedics;  Laterality: Right;  . Total ankle arthroplasty Left 12/20/2014    Procedure: LEFT TOTAL ANKLE ARTHOPLASTY WITH PERCUTANEOUS ACHILLES TENDON LENGTHENING;  Surgeon: Wylene Simmer, MD;  Location: [place];  Service: Orthopedics;  Laterality: Left;    There were no vitals filed for this visit.  Visit Diagnosis:  Ankle stiffness, left  Abnormality of gait      Subjective Assessment - 03/07/15 0731    Subjective Reports no pain only soreness. Did good after last treatment.   Patient Stated Goals Improve range of motion and strength of right ankle.   Currently in Pain? No/denies                         [facility] Adult PT Treatment/Exercise -  03/07/15 0001    Modalities   Modalities Vasopneumatic   Vasopneumatic   Number Minutes Vasopneumatic  15 minutes   Vasopnuematic Location  Ankle   Vasopneumatic Pressure Medium   Ankle Exercises: Aerobic   Stationary Bike NuStep L5 x15 min   Ankle Exercises: Stretches   Other Stretch prostetch x42min   Ankle Exercises: Standing   Rocker Board 3 minutes   Other Standing Ankle Exercises 6" step ups 3x10   Ankle Exercises: Seated   Other Seated Ankle Exercises L ankle 4 way strengthening x30 reps yellow band   Other Seated Ankle Exercises ankle isolator 1# DF/circles 3 x10                  PT Short Term Goals - 02/26/15 1426    PT SHORT TERM GOAL #1   Title Ind with            PT Long Term Goals - 03/05/15 0847    PT LONG TERM GOAL #1   Title Ind with HEP.   Time 4   Period Weeks   Status On-going   PT LONG TERM GOAL #2   Title Active dorsiflexion to 6 degrees to normalize gait pattern.   Time 4   Period Weeks   Status On-going  Plan - 03/07/15 0752    Clinical Impression Statement Patient progressing with ROM and strengthening exercises and continues to report no pain. Only improved by 1degree DF this week. Goals ongoing due to ROM deficits.   PT Frequency 2x / week   PT Duration 4 weeks   PT Treatment/Interventions ADLs/Self Care Home Management;Cryotherapy;Vasopneumatic Device;Manual techniques;Therapeutic exercise;Neuromuscular re-education;Passive range of motion   PT Next Visit Plan Nustep for ankle range of motion.  T-band strengthening and ankle isolator.  Passive stretching to increase left ankle dorsiflexion.  Bilateral rockerbaord okay in parallel bars with bilateral UE support to minimize load on left ankle.   Consulted and Agree with Plan of Care Patient        Problem List Patient Active Problem List   Diagnosis Date Noted  . Post-traumatic arthritis of ankle 12/20/2014  . Acute medial meniscal tear 02/06/2014  . Low  bone mass 12/14/2012  . GAD (generalized anxiety disorder) 12/14/2012  . Unspecified vitamin D deficiency 12/14/2012    Phillips Climes, PTA 03/07/2015, 8:28 AM  [facility] [place], [place], [address] Phone: [phone]   Fax:  [phone]

## 2015-03-12 ENCOUNTER — Ambulatory Visit: Payer: Medicare Other | Admitting: Physical Therapy

## 2015-03-12 ENCOUNTER — Ambulatory Visit (INDEPENDENT_AMBULATORY_CARE_PROVIDER_SITE_OTHER): Payer: Medicare Other

## 2015-03-12 ENCOUNTER — Encounter: Payer: Self-pay | Admitting: Physical Therapy

## 2015-03-12 DIAGNOSIS — M25672 Stiffness of left ankle, not elsewhere classified: Secondary | ICD-10-CM | POA: Diagnosis not present

## 2015-03-12 DIAGNOSIS — Z780 Asymptomatic menopausal state: Secondary | ICD-10-CM | POA: Diagnosis not present

## 2015-03-12 DIAGNOSIS — R269 Unspecified abnormalities of gait and mobility: Secondary | ICD-10-CM | POA: Diagnosis not present

## 2015-03-12 NOTE — Therapy (Signed)
[facility] [place], [place], [address] Phone: [phone]   Fax:  [phone]  Physical Therapy Treatment  Patient Details  Name: Mary Moss MRN: [id number] Date of Birth: 04-16-1936 Referring Provider:  Chevis Pretty, *  Encounter Date: 03/12/2015      PT End of Session - 03/12/15 0753    Visit Number 5   Number of Visits 12   Date for PT Re-Evaluation 04/09/15   PT Start Time 0727   PT Stop Time 0825   PT Time Calculation (min) 58 min   Activity Tolerance Patient tolerated treatment well   Behavior During Therapy [facility] for tasks assessed/performed      Past Medical History  Diagnosis Date  . Basal cell carcinoma 2015    Nose  . Ankle fracture fx 13 yrs ago, anklw swells off and on, has cramps in ankle also    left, to seee dr hewitt about 02-16-14  . GERD (gastroesophageal reflux disease)     OTC med  . Arthritis     Past Surgical History  Procedure Laterality Date  . Abdominal hysterectomy    . Rotator cuff repair Right 2009  . Fracture surgery Left 2001  . Knee arthroscopy Right 02/07/2014    Procedure: RIGHT ARTHROSCOPY KNEE, WITH MEDIAL MENISCAL DEBRIDEMENT AND CHONDRAPLASTY;  Surgeon: Gearlean Alf, MD;  Location: [facility];  Service: Orthopedics;  Laterality: Right;  . Total ankle arthroplasty Left 12/20/2014    Procedure: LEFT TOTAL ANKLE ARTHOPLASTY WITH PERCUTANEOUS ACHILLES TENDON LENGTHENING;  Surgeon: Wylene Simmer, MD;  Location: [place];  Service: Orthopedics;  Laterality: Left;    There were no vitals filed for this visit.  Visit Diagnosis:  Ankle stiffness, left  Abnormality of gait      Subjective Assessment - 03/12/15 0745    Subjective Patient reported no pain in ankle and is improving   Patient Stated Goals Improve range of motion and strength of right ankle.   Currently in Pain? No/denies            [facility] PT Assessment - 03/12/15 0001    AROM   Overall AROM   Deficits   Overall AROM Comments AROM 3 degrees DF                     [facility] Adult PT Treatment/Exercise - 03/12/15 0001    Modalities   Modalities Vasopneumatic   Vasopneumatic   Number Minutes Vasopneumatic  15 minutes   Vasopnuematic Location  Ankle   Vasopneumatic Pressure Medium   Manual Therapy   Manual Therapy Passive ROM   Passive ROM gentle range for all ankle motions   Ankle Exercises: Aerobic   Stationary Bike NuStep L5 x15 min   Ankle Exercises: Standing   Rocker Board 3 minutes   Other Standing Ankle Exercises 6" step ups 3x10   Ankle Exercises: Stretches   Other Stretch prostetch x37mn   Ankle Exercises: Seated   Other Seated Ankle Exercises ankle isolator 1 1/2# DF/circles 3 x10, Inv/Ever 1/2# 2x10 each                PT Education - 03/12/15 0830    Education provided Yes   Education Details HEP stretches   Person(s) Educated Patient   Methods Explanation;Demonstration;Handout   Comprehension Verbalized understanding;Returned demonstration          PT Short Term Goals - 02/26/15 1426    PT SHORT TERM GOAL #1   Title Ind  with            PT Long Term Goals - 03/12/15 0831    PT LONG TERM GOAL #1   Title Ind with HEP.   Time 4   Period Weeks   Status Achieved   PT LONG TERM GOAL #2   Title Active dorsiflexion to 6 degrees to normalize gait pattern.   Time 4   Period Weeks   Status On-going               Plan - 03/12/15 2608    Clinical Impression Statement Patient progressing with all activities today. Patient continues to report no pain and has improved AROM in left ankle DF to 3 degrees. HEP given to patient for self stretches. Patient met LTG #1 and #2 is ongoing due to AROM deficits.   PT Frequency 2x / week   PT Duration 4 weeks   PT Treatment/Interventions ADLs/Self Care Home Management;Cryotherapy;Vasopneumatic Device;Manual techniques;Therapeutic exercise;Neuromuscular re-education;Passive range of motion    PT Next Visit Plan cont with POC   Consulted and Agree with Plan of Care Patient        Problem List Patient Active Problem List   Diagnosis Date Noted  . Post-traumatic arthritis of ankle 12/20/2014  . Acute medial meniscal tear 02/06/2014  . Low bone mass 12/14/2012  . GAD (generalized anxiety disorder) 12/14/2012  . Unspecified vitamin D deficiency 12/14/2012    Phillips Climes, PTA 03/12/2015, 8:37 AM  [facility] [address] [place], [place], [address] Phone: [phone]   Fax:  [phone]

## 2015-03-12 NOTE — Patient Instructions (Signed)
Soleus Stretch   Stand with right foot back, both knees bent. Keeping heel on floor, turned slightly out, lean into wall until stretch is felt in lower calf. Hold _20___ seconds. Repeat __3__ times per set. Do _1___ sets per session. Do __2__ sessions per day.  http://orth.exer.us/24   Copyright  VHI. All rights reserved.  Gastroc Stretch   Stand with right foot back, leg straight, forward leg bent. Keeping heel on floor, turned slightly out, lean into wall until stretch is felt in calf. Hold _20___ seconds. Repeat __3__ times per set. Do _1___ sets per session. Do _2___ sessions per day.  http://orth.exer.us/26   Copyright  VHI. All rights reserved.

## 2015-03-14 ENCOUNTER — Ambulatory Visit: Payer: Medicare Other | Admitting: Physical Therapy

## 2015-03-14 ENCOUNTER — Encounter: Payer: Self-pay | Admitting: Physical Therapy

## 2015-03-14 DIAGNOSIS — R269 Unspecified abnormalities of gait and mobility: Secondary | ICD-10-CM

## 2015-03-14 DIAGNOSIS — M25672 Stiffness of left ankle, not elsewhere classified: Secondary | ICD-10-CM

## 2015-03-14 NOTE — Therapy (Signed)
[facility] [place], [place], [address] Phone: [phone]   Fax:  [phone]  Physical Therapy Treatment  Patient Details  Name: Mary Moss MRN: [id number] Date of Birth: 1935/08/16 Referring Provider:  Chevis Pretty, *  Encounter Date: 03/14/2015      PT End of Session - 03/14/15 0732    Visit Number 6   Number of Visits 12   Date for PT Re-Evaluation 04/09/15   PT Start Time 0731   PT Stop Time 0827   PT Time Calculation (min) 56 min   Activity Tolerance Patient tolerated treatment well   Behavior During Therapy [facility] for tasks assessed/performed      Past Medical History  Diagnosis Date  . Basal cell carcinoma 2015    Nose  . Ankle fracture fx 13 yrs ago, anklw swells off and on, has cramps in ankle also    left, to seee dr hewitt about 02-16-14  . GERD (gastroesophageal reflux disease)     OTC med  . Arthritis     Past Surgical History  Procedure Laterality Date  . Abdominal hysterectomy    . Rotator cuff repair Right 2009  . Fracture surgery Left 2001  . Knee arthroscopy Right 02/07/2014    Procedure: RIGHT ARTHROSCOPY KNEE, WITH MEDIAL MENISCAL DEBRIDEMENT AND CHONDRAPLASTY;  Surgeon: Gearlean Alf, MD;  [facility];  Service: Orthopedics;  Laterality: Right;  . Total ankle arthroplasty Left 12/20/2014    Procedure: LEFT TOTAL ANKLE ARTHOPLASTY WITH PERCUTANEOUS ACHILLES TENDON LENGTHENING;  Surgeon: Wylene Simmer, MD;  Location: [place];  Service: Orthopedics;  Laterality: Left;    There were no vitals filed for this visit.  Visit Diagnosis:  Ankle stiffness, left  Abnormality of gait      Subjective Assessment - 03/14/15 0732    Subjective Reports feeling "okay" this morning.   Patient Stated Goals Improve range of motion and strength of right ankle.   Currently in Pain? No/denies            [facility] PT Assessment - 03/14/15 0001    Assessment   Medical Diagnosis Left  total ankle replacement.   Onset Date/Surgical Date 11/30/14   Next MD Visit 03/2015                     [facility] Adult PT Treatment/Exercise - 03/14/15 0001    Modalities   Modalities Vasopneumatic   Vasopneumatic   Number Minutes Vasopneumatic  15 minutes   Vasopnuematic Location  Ankle   Vasopneumatic Pressure Medium   Ankle Exercises: Aerobic   Stationary Bike NuStep L6 x12 min   Ankle Exercises: Standing   Rocker Board 3 minutes   Other Standing Ankle Exercises 6" forward and lateral step ups 3x10   Ankle Exercises: Stretches   Other Stretch prostetch x74min   Ankle Exercises: Seated   Heel Raises 20 reps;Other (comment)  L ankle   Other Seated Ankle Exercises L ankle isolator 2# 4D strengthening x30 reps each             Balance Exercises - 03/14/15 0803    Balance Exercises: Standing   Other Standing Exercises DLS on inverted BOSU x3 min with intermittant UE support   Balance Exercises: Seated   Other Seated Exercises L ankle BAPS for control/proprioception x30 reps 12-6 and 3-9             PT Short Term Goals - 02/26/15 1426    PT SHORT TERM  GOAL #1   Title Ind with            PT Long Term Goals - 03/12/15 0831    PT LONG TERM GOAL #1   Title Ind with HEP.   Time 4   Period Weeks   Status Achieved   PT LONG TERM GOAL #2   Title Active dorsiflexion to 6 degrees to normalize gait pattern.   Time 4   Period Weeks   Status On-going               Plan - 03/14/15 0820    Clinical Impression Statement Patient tolerated treatment well and completed exercises well with good technique. Demonstrates diminshed balance and proprioception in the L ankle during stabilization and balance exercises. Normal modalities response noted following removal of the modalities. Denied pain following treatment.   PT Frequency 2x / week   PT Duration 4 weeks   PT Treatment/Interventions ADLs/Self Care Home Management;Cryotherapy;Vasopneumatic  Device;Manual techniques;Therapeutic exercise;Neuromuscular re-education;Passive range of motion   PT Next Visit Plan cont with POC   Consulted and Agree with Plan of Care Patient        Problem List Patient Active Problem List   Diagnosis Date Noted  . Post-traumatic arthritis of ankle 12/20/2014  . Acute medial meniscal tear 02/06/2014  . Low bone mass 12/14/2012  . GAD (generalized anxiety disorder) 12/14/2012  . Unspecified vitamin D deficiency 12/14/2012    Wynelle Fanny, PTA 03/14/2015, 8:29 AM  [facility] [address] [place], [place], [address] Phone: [phone]   Fax:  [phone]

## 2015-03-18 DIAGNOSIS — L218 Other seborrheic dermatitis: Secondary | ICD-10-CM | POA: Diagnosis not present

## 2015-03-18 DIAGNOSIS — Z85828 Personal history of other malignant neoplasm of skin: Secondary | ICD-10-CM | POA: Diagnosis not present

## 2015-03-18 DIAGNOSIS — L570 Actinic keratosis: Secondary | ICD-10-CM | POA: Diagnosis not present

## 2015-03-19 ENCOUNTER — Encounter: Payer: Self-pay | Admitting: *Deleted

## 2015-03-19 ENCOUNTER — Ambulatory Visit: Payer: Medicare Other | Admitting: *Deleted

## 2015-03-19 DIAGNOSIS — M25672 Stiffness of left ankle, not elsewhere classified: Secondary | ICD-10-CM

## 2015-03-19 DIAGNOSIS — R269 Unspecified abnormalities of gait and mobility: Secondary | ICD-10-CM

## 2015-03-19 NOTE — Therapy (Signed)
[facility] [place], [place], [address] Phone: [phone]   Fax:  [phone]  Physical Therapy Treatment  Patient Details  Name: Mary Moss MRN: [id number] Date of Birth: Nov 27, 1935 Referring Provider:  Chevis Pretty, *  Encounter Date: 03/19/2015      PT End of Session - 03/19/15 0813    Visit Number 7   Number of Visits 12   Date for PT Re-Evaluation 04/09/15   PT Start Time 0815   PT Stop Time 0913   PT Time Calculation (min) 58 min      Past Medical History  Diagnosis Date  . Basal cell carcinoma 2015    Nose  . Ankle fracture fx 13 yrs ago, anklw swells off and on, has cramps in ankle also    left, to seee dr hewitt about 02-16-14  . GERD (gastroesophageal reflux disease)     OTC med  . Arthritis     Past Surgical History  Procedure Laterality Date  . Abdominal hysterectomy    . Rotator cuff repair Right 2009  . Fracture surgery Left 2001  . Knee arthroscopy Right 02/07/2014    Procedure: RIGHT ARTHROSCOPY KNEE, WITH MEDIAL MENISCAL DEBRIDEMENT AND CHONDRAPLASTY;  Surgeon: Gearlean Alf, MD;  [facility]: [facility];  Service: Orthopedics;  Laterality: Right;  . Total ankle arthroplasty Left 12/20/2014    Procedure: LEFT TOTAL ANKLE ARTHOPLASTY WITH PERCUTANEOUS ACHILLES TENDON LENGTHENING;  Surgeon: Wylene Simmer, MD;  Location: [place];  Service: Orthopedics;  Laterality: Left;    There were no vitals filed for this visit.  Visit Diagnosis:  Ankle stiffness, left  Abnormality of gait      Subjective Assessment - 03/19/15 0820    Subjective Reports feeling "okay" this morning. LT ankle is doing well   Currently in Pain? No/denies                         [facility] Adult PT Treatment/Exercise - 03/19/15 0001    Exercises   Exercises Ankle   Modalities   Modalities Vasopneumatic   Vasopneumatic   Number Minutes Vasopneumatic  15 minutes   Vasopnuematic Location  Ankle   Vasopneumatic Pressure Medium   Vasopneumatic Temperature  47   Ankle Exercises: Stretches   Other Stretch --   Ankle Exercises: Aerobic   Stationary Bike NuStep L4 x12 min  LEs only   Ankle Exercises: Standing   Rocker Board 4 minutes  for balance and calf stretching   Other Standing Ankle Exercises 6" forward and lateral step ups 3x10   Ankle Exercises: Seated   Heel Raises --  L ankle   Other Seated Ankle Exercises Dynadisc LT ankle circles each way x 20   Other Seated Ankle Exercises L ankle isolator 2# 4D strengthening x30 reps each                  PT Short Term Goals - 02/26/15 1426    PT SHORT TERM GOAL #1   Title Ind with            PT Long Term Goals - 03/12/15 0831    PT LONG TERM GOAL #1   Title Ind with HEP.   Time 4   Period Weeks   Status Achieved   PT LONG TERM GOAL #2   Title Active dorsiflexion to 6 degrees to normalize gait pattern.   Time 4   Period Weeks   Status On-going  Plan - 03/19/15 0836    Clinical Impression Statement Pt continues to do well with minimal pain. She still is challenged by proprioception and balance ex.'s, but is doing well. Will be able to progress balance ex.s at 14 weeks post-op as per protocol.   PT Frequency 2x / week   PT Duration 4 weeks   PT Treatment/Interventions ADLs/Self Care Home Management;Cryotherapy;Vasopneumatic Device;Manual techniques;Therapeutic exercise;Neuromuscular re-education;Passive range of motion   PT Next Visit Plan cont with POC, Sx was 12-20-14 see protocol   Consulted and Agree with Plan of Care Patient        Problem List Patient Active Problem List   Diagnosis Date Noted  . Post-traumatic arthritis of ankle 12/20/2014  . Acute medial meniscal tear 02/06/2014  . Low bone mass 12/14/2012  . GAD (generalized anxiety disorder) 12/14/2012  . Unspecified vitamin D deficiency 12/14/2012    RAMSEUR,CHRIS,PTA 03/19/2015, 9:18 AM  [facility] [place], [place], [address] Phone: [phone]   Fax:  [phone]

## 2015-03-21 ENCOUNTER — Ambulatory Visit: Payer: Medicare Other | Admitting: *Deleted

## 2015-03-21 ENCOUNTER — Encounter: Payer: Self-pay | Admitting: *Deleted

## 2015-03-21 DIAGNOSIS — R269 Unspecified abnormalities of gait and mobility: Secondary | ICD-10-CM

## 2015-03-21 DIAGNOSIS — M25672 Stiffness of left ankle, not elsewhere classified: Secondary | ICD-10-CM

## 2015-03-21 NOTE — Therapy (Signed)
[facility] [place], [place], [address] Phone: [phone]   Fax:  [phone]  Physical Therapy Treatment  Patient Details  Name: Mary Moss MRN: [id number] Date of Birth: [birth year] Referring Provider:  Chevis Pretty, *  Encounter Date: 03/21/2015      PT End of Session - 03/21/15 1124    Visit Number 8   Number of Visits 12   Date for PT Re-Evaluation 04/09/15   PT Start Time 1115   PT Stop Time 1205   PT Time Calculation (min) 50 min   Activity Tolerance Patient tolerated treatment well   Behavior During Therapy [facility] for tasks assessed/performed      Past Medical History  Diagnosis Date  . Basal cell carcinoma 2015    Nose  . Ankle fracture fx 13 yrs ago, anklw swells off and on, has cramps in ankle also    left, to seee dr hewitt about 02-16-14  . GERD (gastroesophageal reflux disease)     OTC med  . Arthritis     Past Surgical History  Procedure Laterality Date  . Abdominal hysterectomy    . Rotator cuff repair Right 2009  . Fracture surgery Left 2001  . Knee arthroscopy Right 02/07/2014    Procedure: RIGHT ARTHROSCOPY KNEE, WITH MEDIAL MENISCAL DEBRIDEMENT AND CHONDRAPLASTY;  Surgeon: Gearlean Alf, MD;  [facility];  Service: Orthopedics;  Laterality: Right;  . Total ankle arthroplasty Left 12/20/2014    Procedure: LEFT TOTAL ANKLE ARTHOPLASTY WITH PERCUTANEOUS ACHILLES TENDON LENGTHENING;  Surgeon: Wylene Simmer, MD;  Location: [place];  Service: Orthopedics;  Laterality: Left;    There were no vitals filed for this visit.  Visit Diagnosis:  Ankle stiffness, left  Abnormality of gait      Subjective Assessment - 03/21/15 1111    Subjective Pt feels that she is doing better with walking now with LT ankle   Patient Stated Goals Improve range of motion and strength of right ankle.   Currently in Pain? No/denies                         [facility] Adult PT  Treatment/Exercise - 03/21/15 0001    Exercises   Exercises Ankle   Modalities   Modalities Vasopneumatic   Vasopneumatic   Number Minutes Vasopneumatic  --   Vasopnuematic Location  --   Vasopneumatic Pressure --   Vasopneumatic Temperature  --   Ankle Exercises: Aerobic   Stationary Bike NuStep L4 x15 min  LEs only   Ankle Exercises: Standing   Rocker Board 5 minutes  for balance and calf stretching, standing on balance pad  RS   Other Standing Ankle Exercises 6" forward and lateral step ups 3x10   Ankle Exercises: Seated   Other Seated Ankle Exercises Dynadisc LT ankle, DF/PF, and circles each way x 5 mins   Other Seated Ankle Exercises L ankle isolator 2# 4D strengthening x30 reps each  Balance on airex pad with manual RS               PT Short Term Goals - 02/26/15 1426    PT SHORT TERM GOAL #1   Title Ind with            PT Long Term Goals - 03/12/15 0831    PT LONG TERM GOAL #1   Title Ind with HEP.   Time 4   Period Weeks   Status Achieved   PT LONG TERM GOAL #2   Title Active dorsiflexion to 6 degrees to normalize gait pattern.   Time 4   Period Weeks   Status On-going               Plan - 03/21/15 1205    Clinical Impression Statement Pt did great again today and was able to get close to meeting DF goal. He had 5 degrees today and the goal is 6 degrees.She did well with Rhythmic stab. for balance challenge without LOB.   PT Frequency 2x / week   PT Duration 4 weeks   PT Treatment/Interventions ADLs/Self Care Home Management;Cryotherapy;Vasopneumatic Device;Manual techniques;Therapeutic exercise;Neuromuscular re-education;Passive range of motion   PT Next Visit Plan cont with POC, Sx was 12-20-14 see protocol   Consulted and Agree with Plan of Care Patient        Problem List Patient Active Problem List   Diagnosis Date Noted  .  Post-traumatic arthritis of ankle 12/20/2014  . Acute medial meniscal tear 02/06/2014  . Low bone mass 12/14/2012  . GAD (generalized anxiety disorder) 12/14/2012  . Unspecified vitamin D deficiency 12/14/2012    RAMSEUR,CHRIS, PTA 03/21/2015, 12:19 PM  [facility] [address] [place], [place], [address] Phone: [phone]   Fax:  [phone]

## 2015-03-26 ENCOUNTER — Ambulatory Visit: Payer: Medicare Other | Admitting: Physical Therapy

## 2015-03-26 ENCOUNTER — Encounter: Payer: Self-pay | Admitting: Physical Therapy

## 2015-03-26 DIAGNOSIS — M25672 Stiffness of left ankle, not elsewhere classified: Secondary | ICD-10-CM | POA: Diagnosis not present

## 2015-03-26 DIAGNOSIS — R269 Unspecified abnormalities of gait and mobility: Secondary | ICD-10-CM

## 2015-03-26 NOTE — Therapy (Signed)
[facility] [place], [place], [address] Phone: [phone]   Fax:  [phone]  Physical Therapy Treatment  Patient Details  Name: Mary Moss MRN: [id number] Date of Birth: [birth year] Referring Provider:  Chevis Pretty, *  Encounter Date: 03/26/2015      PT End of Session - 03/26/15 0803    Visit Number 9   Number of Visits 12   Date for PT Re-Evaluation 04/09/15   PT Start Time 0728   PT Stop Time 0826   PT Time Calculation (min) 58 min   Activity Tolerance Patient tolerated treatment well   Behavior During Therapy [facility] for tasks assessed/performed      Past Medical History  Diagnosis Date  . Basal cell carcinoma 2015    Nose  . Ankle fracture fx 13 yrs ago, anklw swells off and on, has cramps in ankle also    left, to seee dr hewitt about 02-16-14  . GERD (gastroesophageal reflux disease)     OTC med  . Arthritis     Past Surgical History  Procedure Laterality Date  . Abdominal hysterectomy    . Rotator cuff repair Right 2009  . Fracture surgery Left 2001  . Knee arthroscopy Right 02/07/2014    Procedure: RIGHT ARTHROSCOPY KNEE, WITH MEDIAL MENISCAL DEBRIDEMENT AND CHONDRAPLASTY;  Surgeon: Gearlean Alf, MD;  Location: [facility];  Service: Orthopedics;  Laterality: Right;  . Total ankle arthroplasty Left 12/20/2014    Procedure: LEFT TOTAL ANKLE ARTHOPLASTY WITH PERCUTANEOUS ACHILLES TENDON LENGTHENING;  Surgeon: Wylene Simmer, MD;  Location: [place];  Service: Orthopedics;  Laterality: Left;    There were no vitals filed for this visit.  Visit Diagnosis:  Ankle stiffness, left  Abnormality of gait      Subjective Assessment - 03/26/15 0734    Subjective no pain in ankle today   Patient Stated Goals Improve range of motion and strength of right ankle.   Currently in Pain? No/denies                         [facility] Adult PT Treatment/Exercise - 03/26/15 0001    Vasopneumatic    Number Minutes Vasopneumatic  15 minutes   Vasopnuematic Location  Ankle   Vasopneumatic Pressure Medium   Ankle Exercises: Aerobic   Stationary Bike NuStep L4 x15 min  LEs only   Ankle Exercises: Standing   Rocker Board 5 minutes   Other Standing Ankle Exercises 6" forward and lateral step ups 3x10 each   Ankle Exercises: Seated   Other Seated Ankle Exercises seated prostretch x5mn   Other Seated Ankle Exercises L ankle isolator 2# DF/circles x30 reps each, inv/ever x20 each 1#                  PT Short Term Goals - 02/26/15 1426    PT SHORT TERM GOAL #1   Title Ind with            PT Long Term Goals - 03/26/15 0805    PT LONG TERM GOAL #1   Title Ind with HEP.   Time 4   Period Weeks   Status Achieved   PT LONG TERM GOAL #2   Title Active dorsiflexion to 6 degrees to normalize gait pattern.   Time 4   Period Weeks   Status Achieved  6 degrees               Plan - 03/26/15  0817    Clinical Impression Statement Patient has met all current goals and would like to DC. Patient has no pain and is independent with HEP.   PT Frequency 2x / week   PT Duration 4 weeks   PT Treatment/Interventions ADLs/Self Care Home Management;Cryotherapy;Vasopneumatic Device;Manual techniques;Therapeutic exercise;Neuromuscular re-education;Passive range of motion   PT Next Visit Plan DC per patient/MPT   Consulted and Agree with Plan of Care Patient        Problem List Patient Active Problem List   Diagnosis Date Noted  . Post-traumatic arthritis of ankle 12/20/2014  . Acute medial meniscal tear 02/06/2014  . Low bone mass 12/14/2012  . GAD (generalized anxiety disorder) 12/14/2012  . Unspecified vitamin D deficiency 12/14/2012   PHYSICAL THERAPY DISCHARGE SUMMARY  Visits from Start of Care:  Current functional level related to goals / functional outcomes: Please see above.   Remaining deficits: All goals met.   Education /  Equipment: HEP. Plan: Patient agrees to discharge.  Patient goals were met. Patient is being discharged due to meeting the stated rehab goals.  ?????      Kiley Torrence, [place] MPT 03/26/2015, 5:52 PM  [facility] [address] [place], [place], [address] Phone: [phone]   Fax:  [phone]

## 2015-03-26 NOTE — Therapy (Signed)
[facility] [place], [place], [address] Phone: [phone]   Fax:  [phone]  Physical Therapy Treatment  Patient Details  Name: LETHIA DONLON MRN: [id number] Date of Birth: 1935/10/19 Referring Provider:  Chevis Pretty, *  Encounter Date: 03/26/2015      PT End of Session - 03/26/15 0803    Visit Number 9   Number of Visits 12   Date for PT Re-Evaluation 04/09/15   PT Start Time 0728   PT Stop Time 0826   PT Time Calculation (min) 58 min   Activity Tolerance Patient tolerated treatment well   Behavior During Therapy [facility] for tasks assessed/performed      Past Medical History  Diagnosis Date  . Basal cell carcinoma 2015    Nose  . Ankle fracture fx 13 yrs ago, anklw swells off and on, has cramps in ankle also    left, to seee dr hewitt about 02-16-14  . GERD (gastroesophageal reflux disease)     OTC med  . Arthritis     Past Surgical History  Procedure Laterality Date  . Abdominal hysterectomy    . Rotator cuff repair Right 2009  . Fracture surgery Left 2001  . Knee arthroscopy Right 02/07/2014    Procedure: RIGHT ARTHROSCOPY KNEE, WITH MEDIAL MENISCAL DEBRIDEMENT AND CHONDRAPLASTY;  Surgeon: Gearlean Alf, MD;  Location: [facility];  Service: Orthopedics;  Laterality: Right;  . Total ankle arthroplasty Left 12/20/2014    Procedure: LEFT TOTAL ANKLE ARTHOPLASTY WITH PERCUTANEOUS ACHILLES TENDON LENGTHENING;  Surgeon: Wylene Simmer, MD;  Location: [place];  Service: Orthopedics;  Laterality: Left;    There were no vitals filed for this visit.  Visit Diagnosis:  Ankle stiffness, left  Abnormality of gait      Subjective Assessment - 03/26/15 0734    Subjective no pain in ankle today   Patient Stated Goals Improve range of motion and strength of right ankle.   Currently in Pain? No/denies                         [facility] Adult PT Treatment/Exercise - 03/26/15 0001    Vasopneumatic    Number Minutes Vasopneumatic  15 minutes   Vasopnuematic Location  Ankle   Vasopneumatic Pressure Medium   Ankle Exercises: Aerobic   Stationary Bike NuStep L4 x15 min  LEs only   Ankle Exercises: Standing   Rocker Board 5 minutes   Other Standing Ankle Exercises 6" forward and lateral step ups 3x10 each   Ankle Exercises: Seated   Other Seated Ankle Exercises seated prostretch x7mn   Other Seated Ankle Exercises L ankle isolator 2# DF/circles x30 reps each, inv/ever x20 each 1#                  PT Short Term Goals - 02/26/15 1426    PT SHORT TERM GOAL #1   Title Ind with            PT Long Term Goals - 03/26/15 0805    PT LONG TERM GOAL #1   Title Ind with HEP.   Time 4   Period Weeks   Status Achieved   PT LONG TERM GOAL #2   Title Active dorsiflexion to 6 degrees to normalize gait pattern.   Time 4   Period Weeks   Status Achieved  6 degrees               Plan - 03/26/15  0817    Clinical Impression Statement Patient has met all current goals and would like to DC. Patient has no pain and is independent with HEP.   PT Frequency 2x / week   PT Duration 4 weeks   PT Treatment/Interventions ADLs/Self Care Home Management;Cryotherapy;Vasopneumatic Device;Manual techniques;Therapeutic exercise;Neuromuscular re-education;Passive range of motion   PT Next Visit Plan DC per patient/MPT   Consulted and Agree with Plan of Care Patient        Problem List Patient Active Problem List   Diagnosis Date Noted  . Post-traumatic arthritis of ankle 12/20/2014  . Acute medial meniscal tear 02/06/2014  . Low bone mass 12/14/2012  . GAD (generalized anxiety disorder) 12/14/2012  . Unspecified vitamin D deficiency 12/14/2012   Ladean Raya, PTA 03/26/2015 8:26 AM    Warden Buffa, Venetia Maxon, PTA 03/26/2015, 8:26 AM  [facility] [address] [place], [place], [address] Phone: [phone]   Fax:   [phone]

## 2015-03-28 ENCOUNTER — Encounter: Payer: 59 | Admitting: Physical Therapy

## 2015-04-10 DIAGNOSIS — Z96662 Presence of left artificial ankle joint: Secondary | ICD-10-CM | POA: Diagnosis not present

## 2015-04-10 DIAGNOSIS — Z471 Aftercare following joint replacement surgery: Secondary | ICD-10-CM | POA: Diagnosis not present

## 2015-05-13 IMAGING — CR DG CHEST 2V
2 series · 2 of 2 positions shown · non-contrast
Comparison: 07/26/2012; 09/19/2010

CLINICAL DATA: PREOPERATIVE EXAMINATION (RIGHT KNEE ARTHROSCOPY),
HISTORY OF HYPERTENSION

EXAM:
CHEST  2 VIEW

[w chest pa]
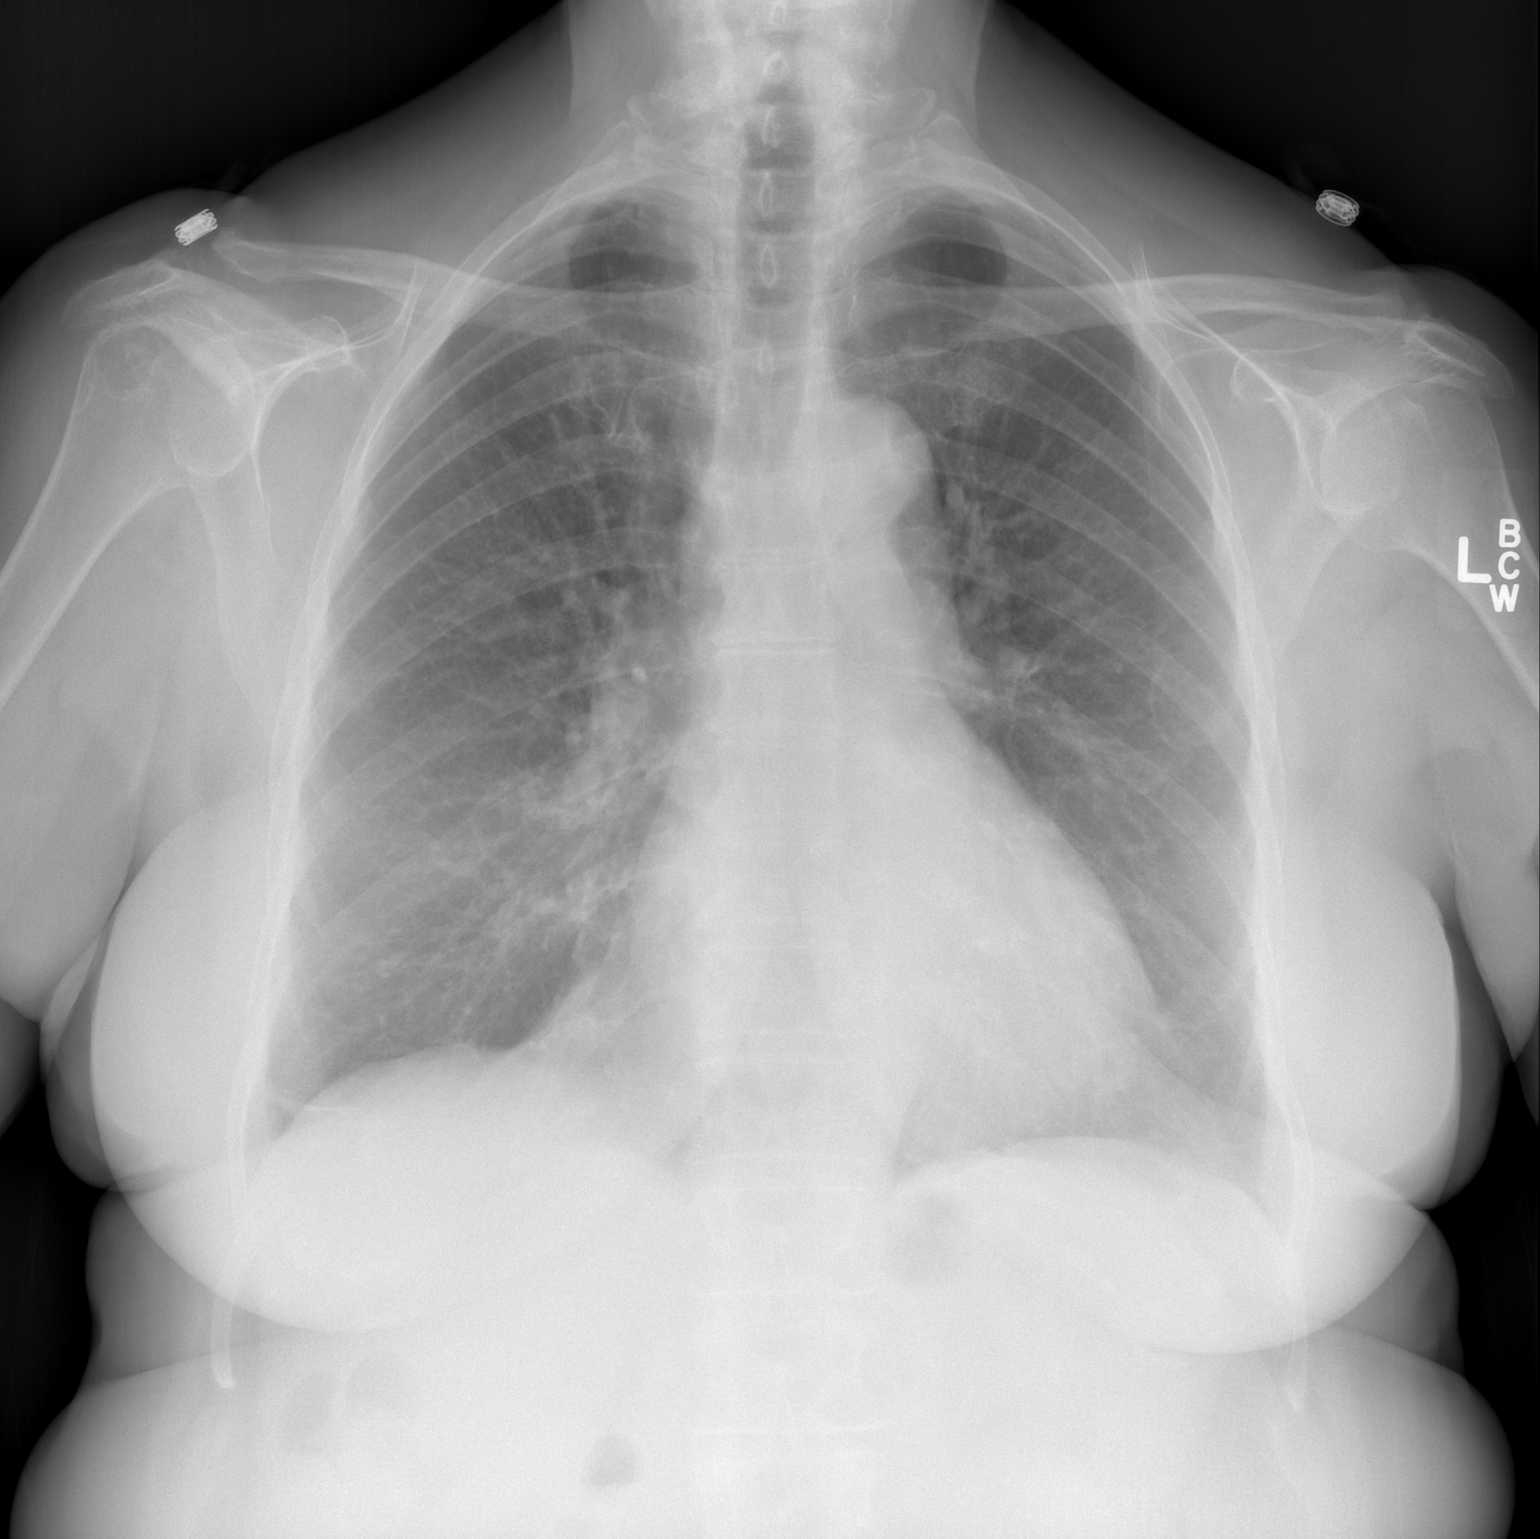

[w chest lat]
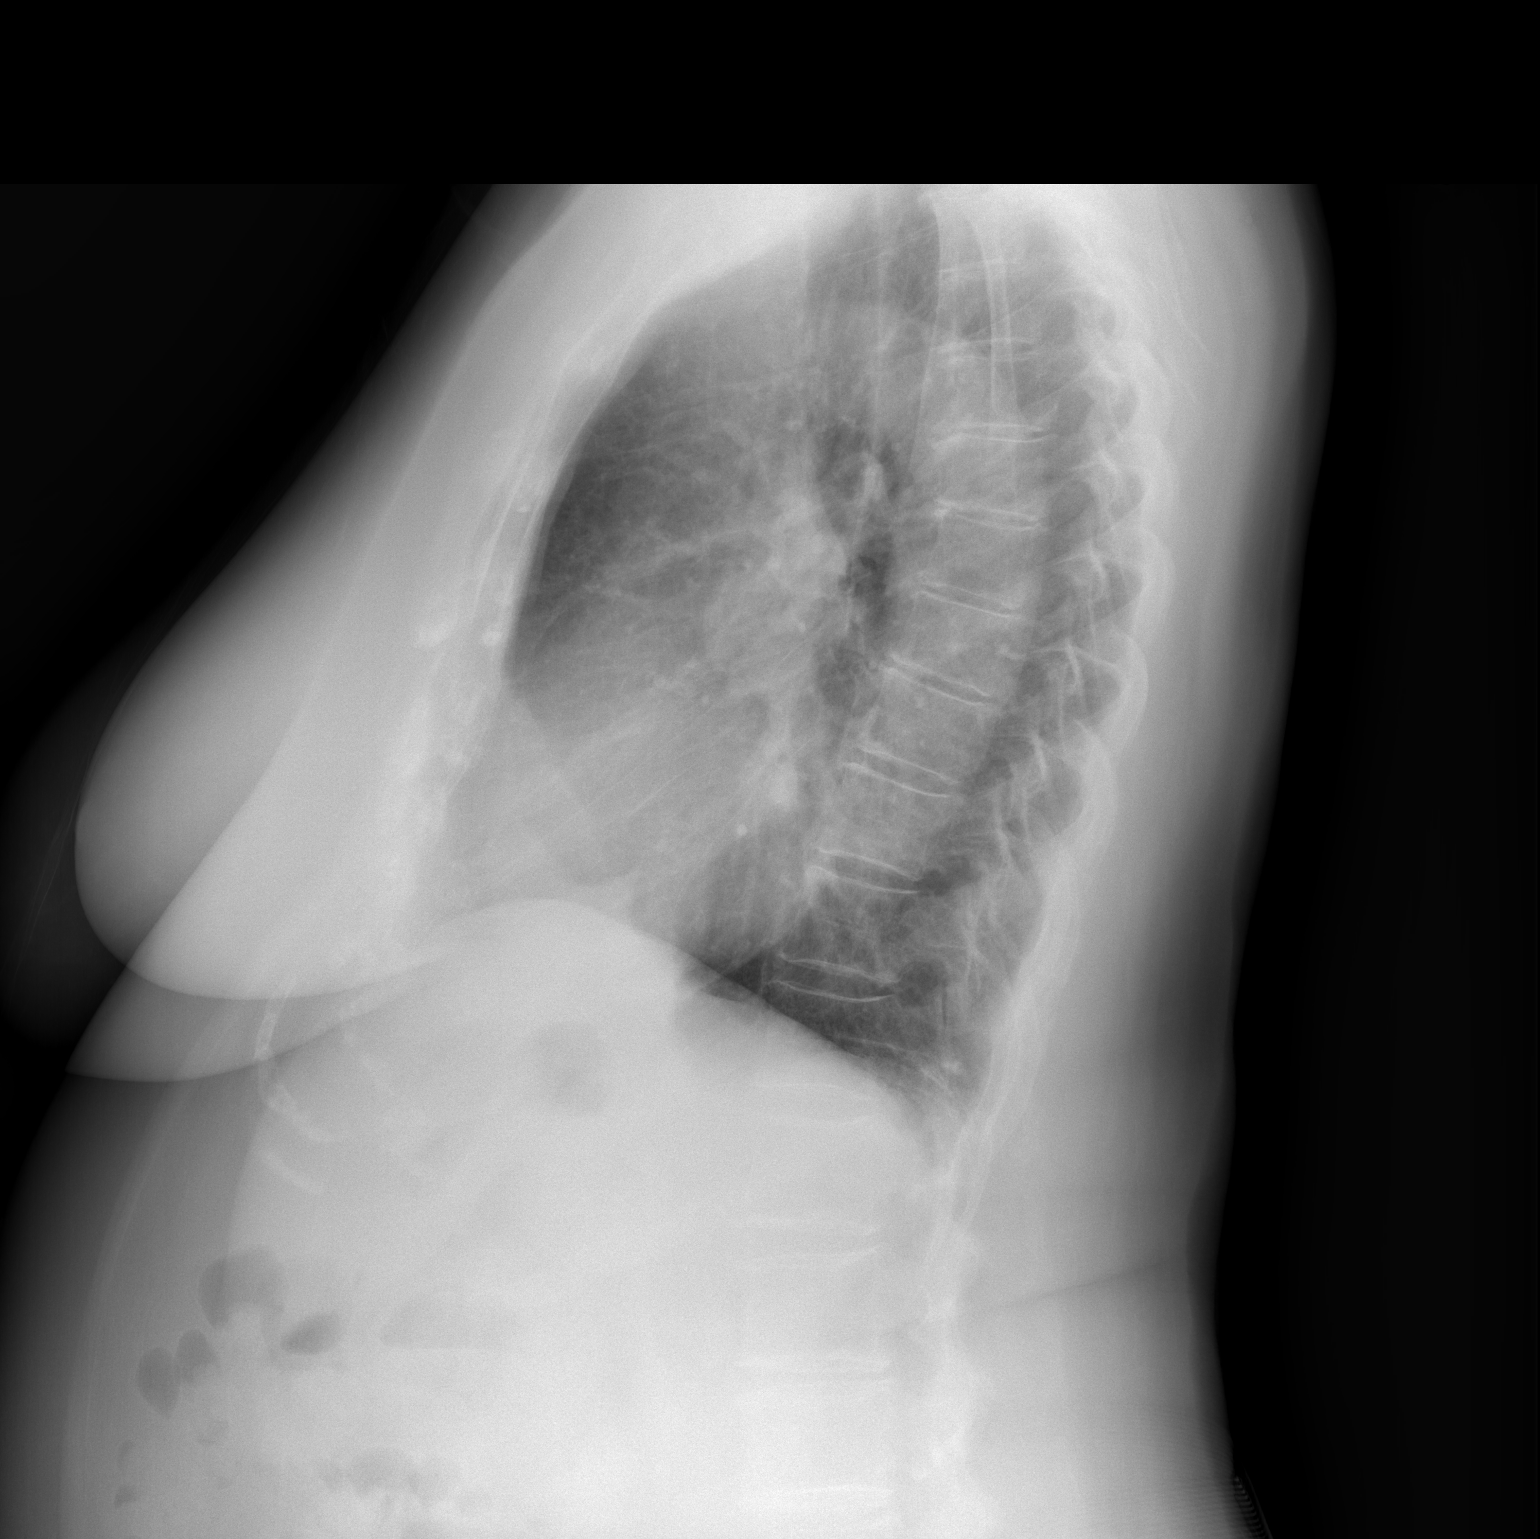

[2 of 2 positions shown; findings below may reference images not displayed]

FINDINGS: Grossly unchanged borderline enlarged cardiac silhouette and
mediastinal contours. The lungs appear hyperexpanded with flattening
of the bilateral diaphragms. Minimal right basilar heterogeneous
opacities favored to represent atelectasis or scar. No new focal
airspace opacities. No pleural effusion or pneumothorax. No evidence
of edema. No acute osseus abnormalities.
IMPRESSION: Similar findings of cardiomegaly and mild lung hyperexpansion
without acute cardiopulmonary disease.

## 2015-06-07 DIAGNOSIS — Z4789 Encounter for other orthopedic aftercare: Secondary | ICD-10-CM | POA: Diagnosis not present

## 2015-06-07 DIAGNOSIS — M1711 Unilateral primary osteoarthritis, right knee: Secondary | ICD-10-CM | POA: Diagnosis not present

## 2015-06-13 ENCOUNTER — Ambulatory Visit (INDEPENDENT_AMBULATORY_CARE_PROVIDER_SITE_OTHER): Payer: Medicare Other

## 2015-06-13 DIAGNOSIS — Z23 Encounter for immunization: Secondary | ICD-10-CM | POA: Diagnosis not present

## 2015-07-03 ENCOUNTER — Ambulatory Visit (INDEPENDENT_AMBULATORY_CARE_PROVIDER_SITE_OTHER): Payer: Medicare Other | Admitting: Pediatrics

## 2015-07-03 ENCOUNTER — Encounter: Payer: Self-pay | Admitting: Pediatrics

## 2015-07-03 VITALS — BP 162/91 | HR 85 | Temp 98.1°F | Ht 69.0 in | Wt 221.8 lb

## 2015-07-03 DIAGNOSIS — Z6832 Body mass index (BMI) 32.0-32.9, adult: Secondary | ICD-10-CM | POA: Diagnosis not present

## 2015-07-03 DIAGNOSIS — Z13220 Encounter for screening for lipoid disorders: Secondary | ICD-10-CM | POA: Diagnosis not present

## 2015-07-03 DIAGNOSIS — I10 Essential (primary) hypertension: Secondary | ICD-10-CM | POA: Insufficient documentation

## 2015-07-03 DIAGNOSIS — Z1389 Encounter for screening for other disorder: Secondary | ICD-10-CM | POA: Diagnosis not present

## 2015-07-03 DIAGNOSIS — Z1331 Encounter for screening for depression: Secondary | ICD-10-CM

## 2015-07-03 LAB — POCT UA - MICROSCOPIC ONLY
Bacteria, U Microscopic: NEGATIVE
CASTS, UR, LPF, POC: NEGATIVE
Crystals, Ur, HPF, POC: NEGATIVE
RBC, urine, microscopic: NEGATIVE
Yeast, UA: NEGATIVE

## 2015-07-03 LAB — POCT URINALYSIS DIPSTICK
Bilirubin, UA: NEGATIVE
Blood, UA: NEGATIVE
GLUCOSE UA: NEGATIVE
Ketones, UA: NEGATIVE
Nitrite, UA: NEGATIVE
Protein, UA: NEGATIVE
SPEC GRAV UA: 1.02
UROBILINOGEN UA: NEGATIVE
pH, UA: 6.5

## 2015-07-03 NOTE — Progress Notes (Signed)
Subjective:    Patient ID: Mary Moss, female    DOB: [birth year], 79 y.o.   MRN: [id number]  CC: follow up multiple med problems  HPI: Mary Moss is a 79 y.o. female presenting for Follow-up  Feeling well overall Elevated blood pressure today, per chart review has regularly been elevated  Pt denies HA, vision changes Appetite normal, bowel habits normal Had an injection in her R knee L ankle replacement earlier this year, thinks she overused her R knee from limping following the procedure, followed by ortho  Family is coming to visit from [place], her brother with dementia is in [place].   Depression screen [facility] 2/9 07/03/2015 05/19/2014  Decreased Interest 0 0  Down, Depressed, Hopeless 0 0  PHQ - 2 Score 0 0     Relevant past medical, surgical, family and social history reviewed and updated as indicated. Interim medical history since our last visit reviewed. Allergies and medications reviewed and updated.    ROS: Per HPI unless specifically indicated above  History  Smoking status  . Former Smoker -- 0.50 packs/day for 40 years  . Types: Cigarettes  . Start date: 07/27/1957  . Quit date: 12/15/2007  Smokeless tobacco  . Never Used    Past Medical History Patient Active Problem List   Diagnosis Date Noted  . Essential hypertension 07/03/2015  . Post-traumatic arthritis of ankle 12/20/2014  . Acute medial meniscal tear 02/06/2014  . Low bone mass 12/14/2012  . GAD (generalized anxiety disorder) 12/14/2012  . Unspecified vitamin D deficiency 12/14/2012    No current outpatient prescriptions on file.   No current facility-administered medications for this visit.       Objective:    BP 162/91 mmHg  Pulse 85  Temp(Src) 98.1 F (36.7 C) (Oral)  Ht '5\' 9"'  (1.753 m)  Wt 221 lb 12.8 oz (100.608 kg)  BMI 32.74 kg/m2  Wt Readings from Last 3 Encounters:  07/03/15 221 lb 12.8 oz (100.608 kg)  12/20/14 221 lb (100.245 kg)  12/07/14 219 lb  12.8 oz (99.701 kg)     Gen: NAD, alert, cooperative with exam, NCAT EYES: EOMI, no scleral injection or icterus ENT:  TMs pearly gray b/l, OP without erythema LYMPH: no cervical LAD CV: NRRR, normal S1/S2, no murmur, distal pulses 2+ b/l Resp: CTABL, no wheezes, normal WOB Abd: +BS, soft, NTND. Ext: No edema, warm Neuro: Alert and oriented, strength equal b/l UE and LE, coordination grossly normal MSK: L ankle bigger compared with R, no pitting edema     Assessment & Plan:    Aarthi was seen today for follow-up of multiple med problems.  Diagnoses and all orders for this visit:  Essential hypertension Poorly controlled. Pt does not want to start medicine today, says she has never been told she has high BP before. Will check labs, she will check BPs at home regularly and bring back to clinic for next visit. Also avoid salt, discussed lifestyle changes and weight loss benefit to BP. -     BMP8+EGFR -     Lipid panel -     POCT UA - Microscopic Only -     POCT urinalysis dipstick  Screening for hyperlipidemia Checking lipid panel today  BMI 32.0-32.9,adult Discussed start of gentle activity, limited some by OA in R knee followed by ortho and L ankle though ankle is improving.  Depression screen Negative screen.    Follow up plan: Return in about 4 weeks (around 07/31/2015) for  AWV and CPE.  Assunta Found, MD [facility] 07/03/2015, 8:31 AM

## 2015-07-04 LAB — LIPID PANEL
CHOL/HDL RATIO: 3.6 ratio (ref 0.0–4.4)
Cholesterol, Total: 239 mg/dL — ABNORMAL HIGH (ref 100–199)
HDL: 66 mg/dL (ref >39)
LDL Calculated: 155 mg/dL — ABNORMAL HIGH (ref 0–99)
Triglycerides: 91 mg/dL (ref 0–149)
VLDL Cholesterol Cal: 18 mg/dL (ref 5–40)

## 2015-07-04 LAB — BMP8+EGFR
BUN / CREAT RATIO: 17 (ref 11–26)
BUN: 12 mg/dL (ref 8–27)
CO2: 24 mmol/L (ref 18–29)
Calcium: 9.1 mg/dL (ref 8.7–10.3)
Chloride: 103 mmol/L (ref 97–106)
Creatinine, Ser: 0.7 mg/dL (ref 0.57–1.00)
GFR, EST AFRICAN AMERICAN: 97 mL/min/{1.73_m2} (ref >59)
GFR, EST NON AFRICAN AMERICAN: 84 mL/min/{1.73_m2} (ref >59)
Glucose: 116 mg/dL — ABNORMAL HIGH (ref 65–99)
Potassium: 4.5 mmol/L (ref 3.5–5.2)
SODIUM: 142 mmol/L (ref 136–144)

## 2015-07-05 LAB — POCT GLYCOSYLATED HEMOGLOBIN (HGB A1C): Hemoglobin A1C: 6.1

## 2015-07-05 NOTE — Addendum Note (Signed)
Addended by: Selmer Dominion on: 07/05/2015 02:12 PM   Modules accepted: Orders

## 2015-07-12 ENCOUNTER — Encounter: Payer: Self-pay | Admitting: Pediatrics

## 2015-07-12 DIAGNOSIS — R7303 Prediabetes: Secondary | ICD-10-CM | POA: Insufficient documentation

## 2015-07-25 DIAGNOSIS — L821 Other seborrheic keratosis: Secondary | ICD-10-CM | POA: Diagnosis not present

## 2015-07-25 DIAGNOSIS — Z85828 Personal history of other malignant neoplasm of skin: Secondary | ICD-10-CM | POA: Diagnosis not present

## 2015-07-25 DIAGNOSIS — C44519 Basal cell carcinoma of skin of other part of trunk: Secondary | ICD-10-CM | POA: Diagnosis not present

## 2015-07-25 DIAGNOSIS — L578 Other skin changes due to chronic exposure to nonionizing radiation: Secondary | ICD-10-CM | POA: Diagnosis not present

## 2015-07-25 DIAGNOSIS — L304 Erythema intertrigo: Secondary | ICD-10-CM | POA: Diagnosis not present

## 2015-07-26 DIAGNOSIS — Z1231 Encounter for screening mammogram for malignant neoplasm of breast: Secondary | ICD-10-CM | POA: Diagnosis not present

## 2015-07-26 LAB — HM MAMMOGRAPHY: HM Mammogram: NEGATIVE

## 2015-08-05 ENCOUNTER — Encounter: Payer: Self-pay | Admitting: Pediatrics

## 2015-08-05 ENCOUNTER — Ambulatory Visit (INDEPENDENT_AMBULATORY_CARE_PROVIDER_SITE_OTHER): Payer: Medicare Other | Admitting: Pediatrics

## 2015-08-05 VITALS — BP 185/87 | HR 74 | Temp 96.7°F | Ht 69.0 in | Wt 224.4 lb

## 2015-08-05 DIAGNOSIS — I10 Essential (primary) hypertension: Secondary | ICD-10-CM

## 2015-08-05 DIAGNOSIS — Z0000 Encounter for general adult medical examination without abnormal findings: Secondary | ICD-10-CM | POA: Insufficient documentation

## 2015-08-05 DIAGNOSIS — Z6832 Body mass index (BMI) 32.0-32.9, adult: Secondary | ICD-10-CM

## 2015-08-05 DIAGNOSIS — L298 Other pruritus: Secondary | ICD-10-CM

## 2015-08-05 DIAGNOSIS — N898 Other specified noninflammatory disorders of vagina: Secondary | ICD-10-CM | POA: Insufficient documentation

## 2015-08-05 DIAGNOSIS — R739 Hyperglycemia, unspecified: Secondary | ICD-10-CM | POA: Diagnosis not present

## 2015-08-05 LAB — POCT WET PREP WITH KOH
CLUE CELLS WET PREP PER HPF POC: NEGATIVE
TRICHOMONAS UA: NEGATIVE

## 2015-08-05 LAB — POCT GLYCOSYLATED HEMOGLOBIN (HGB A1C): HEMOGLOBIN A1C: 5.7

## 2015-08-05 MED ORDER — HYDROCHLOROTHIAZIDE 25 MG PO TABS: 25.0000 mg | ORAL_TABLET | Freq: Every day | ORAL | Status: DC

## 2015-08-05 NOTE — Progress Notes (Signed)
Subjective:   Mary Moss is a 80 y.o. female who presents for an Initial Medicare Annual Wellness Visit.  175-185 at home SBPs 80s-90s on the bottom  No HA or vision changes  No chest pain or SOB Generally feeling pretty well  Not exercising because joints bothering her R knee cartilage injury,   Has tried simvastatin, livilo, and crestor for statin therapy in the past, gets muscle aches with them, cant continue  Has had perineal itching for weeks to months. No rash per pt. No discharge. Not sexually active.  Review of Systems  All systems negative other than what is in HPI  Current Medications (verified) No outpatient encounter prescriptions on file as of 08/05/2015.   No facility-administered encounter medications on file as of 08/05/2015.    Allergies (verified) Neosporin and Polysporin   History: Past Medical History  Diagnosis Date  . Basal cell carcinoma 2015    Nose  . Ankle fracture fx 13 yrs ago, anklw swells off and on, has cramps in ankle also    left, to seee dr hewitt about 02-16-14  . GERD (gastroesophageal reflux disease)     OTC med  . Arthritis    Past Surgical History  Procedure Laterality Date  . Abdominal hysterectomy    . Rotator cuff repair Right 2009  . Fracture surgery Left 2001  . Knee arthroscopy Right 02/07/2014    Procedure: RIGHT ARTHROSCOPY KNEE, WITH MEDIAL MENISCAL DEBRIDEMENT AND CHONDRAPLASTY;  Surgeon: Gearlean Alf, MD;  Location: [facility];  Service: Orthopedics;  Laterality: Right;  . Total ankle arthroplasty Left 12/20/2014    Procedure: LEFT TOTAL ANKLE ARTHOPLASTY WITH PERCUTANEOUS ACHILLES TENDON LENGTHENING;  Surgeon: Wylene Simmer, MD;  Location: [place];  Service: Orthopedics;  Laterality: Left;   Family History  Problem Relation Age of Onset  . Cancer Mother     colon  . Cancer Father     lung   Social History   Occupational History  . Not on file.   Social History Main Topics  . Smoking status: Former Smoker  -- 0.50 packs/day for 40 years    Types: Cigarettes    Start date: 07/27/1957    Quit date: 12/15/2007  . Smokeless tobacco: Never Used  . Alcohol Use: No  . Drug Use: No  . Sexual Activity: Yes    Do you feel safe at home?  Yes  Dietary issues and exercise activities: Current Exercise Habits:: The patient does not participate in regular exercise at present  Current Dietary habits:  Avoiding desserts, eating vegetables   Objective:    Today's Vitals   08/05/15 1515  BP: 185/87  Pulse: 74  Temp: 96.7 F (35.9 C)  TempSrc: Oral  Height: 5\' 9"  (1.753 m)  Weight: 224 lb 6.4 oz (101.787 kg)   Body mass index is 33.12 kg/(m^2).  Gen: NAD, alert, cooperative with exam, NCAT EYES: EOMI, no scleral injection or icterus ENT:  OP without erythema CV: NRRR, normal S1/S2, no murmur, DP pulses 2+ b/l Resp: CTABL, no wheezes, normal WOB Abd: +BS, soft, NTND. no guarding or organomegaly Ext: No edema, warm Neuro: Alert and oriented, strength equal b/l UE and LE, coodrination grossly normal MSK: normal muscle bulk   Activities of Daily Living In your present state of health, do you have any difficulty performing the following activities: 08/05/2015 12/20/2014  Hearing? [place]? N -  Difficulty concentrating or making decisions? N -  Walking or climbing stairs? Y -  Dressing or bathing? N -  Doing errands, shopping? N N  Preparing Food and eating ? N -  Using the Toilet? N -  In the past six months, have you accidently leaked urine? Y -  Do you have problems with loss of bowel control? N -  Managing your Medications? N -  Managing your Finances? N -  Housekeeping or managing your Housekeeping? N -    Are there smokers in your home (other than you)? No   Cardiac Risk Factors include: advanced age (>84men, >58 women);sedentary lifestyle  Depression Screen PHQ 2/9 Scores 08/05/2015 07/03/2015 05/19/2014  PHQ - 2 Score 0 0 0    Fall Risk Fall Risk  08/05/2015 07/03/2015  05/19/2014  Falls in the past year? No No No    Cognitive Function: MMSE - Mini Mental State Exam 08/05/2015  Orientation to time 5  Orientation to Place 5  Registration 3  Attention/ Calculation 5  Recall 3  Language- name 2 objects 2  Language- repeat 1  Language- follow 3 step command 3  Language- read & follow direction 1  Write a sentence 1  Copy design 1  Total score 30    Immunizations and Health Maintenance Immunization History  Administered Date(s) Administered  . Influenza,inj,Quad PF,36+ Mos 05/31/2013, 08/11/2014, 06/13/2015   Health Maintenance Due  Topic Date Due  . DEXA SCAN  06/20/2003  . MAMMOGRAM  07/25/2015    Patient Care Team: Chevis Pretty, FNP as PCP - General (Nurse Practitioner)  Indicate any recent Medical Services you may have received from other than [facility] providers in the past year (date may be approximate).    Assessment:    Annual Wellness Visit    Screening Tests Health Maintenance  Topic Date Due  . DEXA SCAN  06/20/2003  . MAMMOGRAM  07/25/2015  . ZOSTAVAX  10/25/2015 (Originally 06/19/1998)  . TETANUS/TDAP  10/25/2015 (Originally 06/19/1957)  . INFLUENZA VACCINE  02/25/2016  . PNA vac Low Risk Adult (2 of 2 - PPSV23) 06/12/2016        Plan:   During the course of the visit Sherylann was educated and counseled about the following appropriate screening and preventive services:   Vaccines to include Pneumoccal, Influenza, Hepatitis B, Td, Zostavax,  Colorectal cancer screening--every five years, last 3-4 yrs ago, no polyps since the first one  Cardiovascular disease screening--lipids elevated, has been intolerant of statins. Going to increase physical therapy  Diabetes screening--HgA1c today  Bone Denisty / Osteoporosis Screening--UTD  Mammogram--UTD  PAP--s/p hysterectomy  Glaucoma screening--not recently seen eye doctor, usually goes once a year, due for exam  Nutrition counseling--discussed  Advanced  Directives--Step-daughter is her HCPOA   Goals    None      HTN: remains elevated today. Was elevated at home per pt check as well. Start HCTZ. Recheck labs in 4 weeks.  Vaginitis: itching present for months. No discharge. WIll do wet prep. If negative needs exam, then likely treatment for atrophic vaginitis.   Patient Instructions (the written plan) were given to the patient.   Eustaquio Maize, MD   08/05/2015

## 2015-08-06 ENCOUNTER — Encounter: Payer: Self-pay | Admitting: *Deleted

## 2015-08-06 LAB — TSH: TSH: 3.04 u[IU]/mL (ref 0.450–4.500)

## 2015-09-02 ENCOUNTER — Ambulatory Visit (INDEPENDENT_AMBULATORY_CARE_PROVIDER_SITE_OTHER): Payer: Medicare Other | Admitting: Pediatrics

## 2015-09-02 ENCOUNTER — Encounter: Payer: Self-pay | Admitting: Pediatrics

## 2015-09-02 VITALS — BP 138/80 | HR 83 | Temp 96.9°F | Ht 69.0 in | Wt 223.2 lb

## 2015-09-02 DIAGNOSIS — I10 Essential (primary) hypertension: Secondary | ICD-10-CM

## 2015-09-02 DIAGNOSIS — E785 Hyperlipidemia, unspecified: Secondary | ICD-10-CM

## 2015-09-02 DIAGNOSIS — Z6832 Body mass index (BMI) 32.0-32.9, adult: Secondary | ICD-10-CM | POA: Diagnosis not present

## 2015-09-02 DIAGNOSIS — M1990 Unspecified osteoarthritis, unspecified site: Secondary | ICD-10-CM | POA: Diagnosis not present

## 2015-09-02 DIAGNOSIS — R7303 Prediabetes: Secondary | ICD-10-CM

## 2015-09-02 MED ORDER — HYDROCHLOROTHIAZIDE 25 MG PO TABS: 25.0000 mg | ORAL_TABLET | Freq: Every day | ORAL | Status: DC

## 2015-09-02 MED ORDER — LISINOPRIL 10 MG PO TABS: 10.0000 mg | ORAL_TABLET | Freq: Every day | ORAL | Status: DC

## 2015-09-02 NOTE — Patient Instructions (Signed)
Continue to check blood pressure at home  Let me know if numbers are high or if you are feeling tired, dizzy or lightheaded  Come back in 2 weeks, bring your BP cuff and we will recheck then and get labs

## 2015-09-02 NOTE — Progress Notes (Signed)
Subjective:    Patient ID: Mary Moss, female    DOB: Feb 22, 1936, 80 y.o.   MRN: [id number]  CC: Follow-up multiple med problems  HPI: Mary Moss is a 80 y.o. female presenting for Follow-up  HTN: 149/73 is the lowest number she has gotten at home. SBPs to 160s/170s, DBP now in 70s-80s.   No headaches, vision changes. No CP, SOB with exertion.   Joint pain: takes aleve as needed, not every day but fairly regularly. Takes tylenol PM or aleve PM to help with sleep at night  Looking forward to walking more in the warm weather  Still working as Engineer, maintenance (IT), very busy season starting with taxes.   Depression screen [facility] 2/9 09/02/2015 08/05/2015 07/03/2015 05/19/2014  Decreased Interest 0 0 0 0  Down, Depressed, Hopeless 0 0 0 0  PHQ - 2 Score 0 0 0 0     Relevant past medical, surgical, family and social history reviewed and updated as indicated. Interim medical history since our last visit reviewed. Allergies and medications reviewed and updated.    ROS: Per HPI unless specifically indicated above  History  Smoking status  . Former Smoker -- 0.50 packs/day for 40 years  . Types: Cigarettes  . Start date: 07/27/1957  . Quit date: 12/15/2007  Smokeless tobacco  . Never Used    Past Medical History Patient Active Problem List   Diagnosis Date Noted  . Encounter for preventive health examination 08/05/2015  . Vaginal itching 08/05/2015  . Pre-diabetes 07/12/2015  . Essential hypertension 07/03/2015  . BMI 32.0-32.9,adult 07/03/2015  . Post-traumatic arthritis of ankle 12/20/2014  . Acute medial meniscal tear 02/06/2014  . Low bone mass 12/14/2012  . GAD (generalized anxiety disorder) 12/14/2012  . Unspecified vitamin D deficiency 12/14/2012    Current Outpatient Prescriptions  Medication Sig Dispense Refill  . hydrochlorothiazide (HYDRODIURIL) 25 MG tablet Take 1 tablet (25 mg total) by mouth daily. 90 tablet 3  . lisinopril (PRINIVIL,ZESTRIL) 10 MG  tablet Take 1 tablet (10 mg total) by mouth daily. 30 tablet 5   No current facility-administered medications for this visit.       Objective:    BP 138/80 mmHg  Pulse 83  Temp(Src) 96.9 F (36.1 C) (Oral)  Ht _0  (1.753 m)  Wt 223 lb 3.2 oz (101.243 kg)  BMI 32.95 kg/m2  Wt Readings from Last 3 Encounters:  09/02/15 223 lb 3.2 oz (101.243 kg)  08/05/15 224 lb 6.4 oz (101.787 kg)  07/03/15 221 lb 12.8 oz (100.608 kg)     Gen: NAD, alert, cooperative with exam, NCAT EYES: EOMI, no scleral injection or icterus ENT:   OP without erythema CV: NRRR, normal S1/S2, no murmur, distal pulses 2+ b/l Resp: CTABL, no wheezes, normal WOB Abd: +BS, soft, NTND.  Ext: No edema, warm Neuro: Alert and oriented, strength equal b/l UE and LE, coordination grossly normal MSK: L ankle slightly more swollen compared with R     Assessment & Plan:    Rucha was seen today for follow-up mulitple med problems.  Diagnoses and all orders for this visit:  Essential hypertension Improved control, still elevated at home. Will start lisinopril. Come back in 2 weeks for recheck, check labs then as well.  -     lisinopril (PRINIVIL,ZESTRIL) 10 MG tablet; Take 1 tablet (10 mg total) by mouth daily. -     hydrochlorothiazide (HYDRODIURIL) 25 MG tablet; Take 1 tablet (25 mg total) by mouth daily. -  BMP8+EGFR; Future  BMI 32.0-32.9,adult Discussed lifetstyle changes, increase activity, fruits and veg  Pre-diabetes Recheck HgA1c next visit  Arthritis Use aleve if needed, avoid daily use.   Hyperlipidemia Has declined statin use for now. Continue to address   Follow up plan: Return for nurse check BP and labs in 2 weeks, see Dr. Evette Doffing in 5 months.  Assunta Found, MD [facility] 09/02/2015, 8:57 AM

## 2015-09-18 DIAGNOSIS — H9113 Presbycusis, bilateral: Secondary | ICD-10-CM | POA: Diagnosis not present

## 2015-09-18 DIAGNOSIS — H9041 Sensorineural hearing loss, unilateral, right ear, with unrestricted hearing on the contralateral side: Secondary | ICD-10-CM | POA: Diagnosis not present

## 2015-09-18 DIAGNOSIS — H903 Sensorineural hearing loss, bilateral: Secondary | ICD-10-CM | POA: Diagnosis not present

## 2015-09-19 DIAGNOSIS — H10502 Unspecified blepharoconjunctivitis, left eye: Secondary | ICD-10-CM | POA: Diagnosis not present

## 2015-09-25 NOTE — Therapy (Signed)
[facility] [place], [place], [address] Phone: [phone]   Fax:  [phone]  Physical Therapy Treatment  Patient Details  Name: Mary Moss MRN: [id number] Date of Birth: 01-20-1936 No Data Recorded  Encounter Date: 03/26/2015    Past Medical History  Diagnosis Date  . Basal cell carcinoma 2015    Nose  . Ankle fracture fx 13 yrs ago, anklw swells off and on, has cramps in ankle also    left, to seee dr hewitt about 02-16-14  . GERD (gastroesophageal reflux disease)     OTC med  . Arthritis     Past Surgical History  Procedure Laterality Date  . Abdominal hysterectomy    . Rotator cuff repair Right 2009  . Fracture surgery Left 2001  . Knee arthroscopy Right 02/07/2014    Procedure: RIGHT ARTHROSCOPY KNEE, WITH MEDIAL MENISCAL DEBRIDEMENT AND CHONDRAPLASTY;  Surgeon: Gearlean Alf, MD;  Location: [facility];  Service: Orthopedics;  Laterality: Right;  . Total ankle arthroplasty Left 12/20/2014    Procedure: LEFT TOTAL ANKLE ARTHOPLASTY WITH PERCUTANEOUS ACHILLES TENDON LENGTHENING;  Surgeon: Wylene Simmer, MD;  Location: [facility];  Service: Orthopedics;  Laterality: Left;    There were no vitals filed for this visit.  Visit Diagnosis:  Ankle stiffness, left  Abnormality of gait                                 PT Short Term Goals - 02/26/15 1426    PT SHORT TERM GOAL #1   Title Ind with            PT Long Term Goals - 03/26/15 0805    PT LONG TERM GOAL #1   Title Ind with HEP.   Time 4   Period Weeks   Status Achieved   PT LONG TERM GOAL #2   Title Active dorsiflexion to 6 degrees to normalize gait pattern.   Time 4   Period Weeks   Status Achieved  6 degrees               Problem List Patient Active Problem List   Diagnosis Date Noted  . Hyperlipidemia 09/02/2015  . Encounter for preventive health examination 08/05/2015  . Vaginal itching 08/05/2015   . Pre-diabetes 07/12/2015  . Essential hypertension 07/03/2015  . BMI 32.0-32.9,adult 07/03/2015  . Post-traumatic arthritis of ankle 12/20/2014  . Acute medial meniscal tear 02/06/2014  . Low bone mass 12/14/2012  . GAD (generalized anxiety disorder) 12/14/2012  . Unspecified vitamin D deficiency 12/14/2012   PHYSICAL THERAPY DISCHARGE SUMMARY  Visits from Start of Care: 9  Current functional level related to goals / functional outcomes: Please see above.   Remaining deficits: All goals met.   Education / Equipment: HEP.  Plan: Patient agrees to discharge.  Patient goals were met. Patient is being discharged due to meeting the stated rehab goals.  ?????      Evania Lyne, [place] MPT 09/25/2015, 5:53 PM  [facility] [address] [place], [place], [address] Phone: [phone]   Fax:  [phone]  Name: Mary Moss MRN: [id number] Date of Birth: January 20, 1936

## 2015-09-30 ENCOUNTER — Telehealth: Payer: Self-pay | Admitting: Pediatrics

## 2015-10-01 NOTE — Telephone Encounter (Signed)
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  Will come by early one morning on her way to work.

## 2015-10-04 ENCOUNTER — Ambulatory Visit (INDEPENDENT_AMBULATORY_CARE_PROVIDER_SITE_OTHER): Payer: Medicare Other | Admitting: *Deleted

## 2015-10-04 VITALS — BP 130/68 | HR 87

## 2015-10-04 DIAGNOSIS — I10 Essential (primary) hypertension: Secondary | ICD-10-CM

## 2015-10-04 NOTE — Patient Instructions (Signed)
Hypertension Hypertension, commonly called high blood pressure, is when the force of blood pumping through your arteries is too strong. Your arteries are the blood vessels that carry blood from your heart throughout your body. A blood pressure reading consists of a higher number over a lower number, such as 110/72. The higher number (systolic) is the pressure inside your arteries when your heart pumps. The lower number (diastolic) is the pressure inside your arteries when your heart relaxes. Ideally you want your blood pressure below 120/80. Hypertension forces your heart to work harder to pump blood. Your arteries may become narrow or stiff. Having untreated or uncontrolled hypertension can cause heart attack, stroke, kidney disease, and other problems. RISK FACTORS Some risk factors for high blood pressure are controllable. Others are not.  Risk factors you cannot control include:   Race. You may be at higher risk if you are African American.  Age. Risk increases with age.  Gender. Men are at higher risk than women before age 45 years. After age 65, women are at higher risk than men. Risk factors you can control include:  Not getting enough exercise or physical activity.  Being overweight.  Getting too much fat, sugar, calories, or salt in your diet.  Drinking too much alcohol. SIGNS AND SYMPTOMS Hypertension does not usually cause signs or symptoms. Extremely high blood pressure (hypertensive crisis) may cause headache, anxiety, shortness of breath, and nosebleed. DIAGNOSIS To check if you have hypertension, your health care provider will measure your blood pressure while you are seated, with your arm held at the level of your heart. It should be measured at least twice using the same arm. Certain conditions can cause a difference in blood pressure between your right and left arms. A blood pressure reading that is higher than normal on one occasion does not mean that you need treatment. If  it is not clear whether you have high blood pressure, you may be asked to return on a different day to have your blood pressure checked again. Or, you may be asked to monitor your blood pressure at home for 1 or more weeks. TREATMENT Treating high blood pressure includes making lifestyle changes and possibly taking medicine. Living a healthy lifestyle can help lower high blood pressure. You may need to change some of your habits. Lifestyle changes may include:  Following the DASH diet. This diet is high in fruits, vegetables, and whole grains. It is low in salt, red meat, and added sugars.  Keep your sodium intake below 2,300 mg per day.  Getting at least 30-45 minutes of aerobic exercise at least 4 times per week.  Losing weight if necessary.  Not smoking.  Limiting alcoholic beverages.  Learning ways to reduce stress. Your health care provider may prescribe medicine if lifestyle changes are not enough to get your blood pressure under control, and if one of the following is true:  You are 18-59 years of age and your systolic blood pressure is above 140.  You are 60 years of age or older, and your systolic blood pressure is above 150.  Your diastolic blood pressure is above 90.  You have diabetes, and your systolic blood pressure is over 140 or your diastolic blood pressure is over 90.  You have kidney disease and your blood pressure is above 140/90.  You have heart disease and your blood pressure is above 140/90. Your personal target blood pressure may vary depending on your medical conditions, your age, and other factors. HOME CARE INSTRUCTIONS    Have your blood pressure rechecked as directed by your health care provider.   Take medicines only as directed by your health care provider. Follow the directions carefully. Blood pressure medicines must be taken as prescribed. The medicine does not work as well when you skip doses. Skipping doses also puts you at risk for  problems.  Do not smoke.   Monitor your blood pressure at home as directed by your health care provider. SEEK MEDICAL CARE IF:   You think you are having a reaction to medicines taken.  You have recurrent headaches or feel dizzy.  You have swelling in your ankles.  You have trouble with your vision. SEEK IMMEDIATE MEDICAL CARE IF:  You develop a severe headache or confusion.  You have unusual weakness, numbness, or feel faint.  You have severe chest or abdominal pain.  You vomit repeatedly.  You have trouble breathing. MAKE SURE YOU:   Understand these instructions.  Will watch your condition.  Will get help right away if you are not doing well or get worse.   This information is not intended to replace advice given to you by your health care provider. Make sure you discuss any questions you have with your health care provider.   Document Released: 07/13/2005 Document Revised: 11/27/2014 Document Reviewed: 05/05/2013 Elsevier Interactive Patient Education 2016 Elsevier Inc.  

## 2015-10-04 NOTE — Progress Notes (Signed)
Patient came in today for a bp check. Patients BP 130/68 mmHg  Pulse 87. Patient advised that I will send her reading to the provider to review and if changes need to be made we will contact her.

## 2015-10-11 DIAGNOSIS — H04123 Dry eye syndrome of bilateral lacrimal glands: Secondary | ICD-10-CM | POA: Diagnosis not present

## 2015-11-18 DIAGNOSIS — H04123 Dry eye syndrome of bilateral lacrimal glands: Secondary | ICD-10-CM | POA: Diagnosis not present

## 2016-01-07 DIAGNOSIS — Z800 Family history of malignant neoplasm of digestive organs: Secondary | ICD-10-CM | POA: Diagnosis not present

## 2016-01-07 DIAGNOSIS — Z1211 Encounter for screening for malignant neoplasm of colon: Secondary | ICD-10-CM | POA: Diagnosis not present

## 2016-01-24 DIAGNOSIS — D2262 Melanocytic nevi of left upper limb, including shoulder: Secondary | ICD-10-CM | POA: Diagnosis not present

## 2016-01-24 DIAGNOSIS — L821 Other seborrheic keratosis: Secondary | ICD-10-CM | POA: Diagnosis not present

## 2016-01-24 DIAGNOSIS — D225 Melanocytic nevi of trunk: Secondary | ICD-10-CM | POA: Diagnosis not present

## 2016-01-24 DIAGNOSIS — D2261 Melanocytic nevi of right upper limb, including shoulder: Secondary | ICD-10-CM | POA: Diagnosis not present

## 2016-01-24 DIAGNOSIS — L814 Other melanin hyperpigmentation: Secondary | ICD-10-CM | POA: Diagnosis not present

## 2016-01-24 DIAGNOSIS — Z85828 Personal history of other malignant neoplasm of skin: Secondary | ICD-10-CM | POA: Diagnosis not present

## 2016-01-24 DIAGNOSIS — D1801 Hemangioma of skin and subcutaneous tissue: Secondary | ICD-10-CM | POA: Diagnosis not present

## 2016-01-24 DIAGNOSIS — L304 Erythema intertrigo: Secondary | ICD-10-CM | POA: Diagnosis not present

## 2016-01-24 DIAGNOSIS — L570 Actinic keratosis: Secondary | ICD-10-CM | POA: Diagnosis not present

## 2016-01-27 DIAGNOSIS — M1711 Unilateral primary osteoarthritis, right knee: Secondary | ICD-10-CM | POA: Diagnosis not present

## 2016-01-31 ENCOUNTER — Encounter: Payer: Self-pay | Admitting: Pediatrics

## 2016-01-31 ENCOUNTER — Ambulatory Visit (INDEPENDENT_AMBULATORY_CARE_PROVIDER_SITE_OTHER): Payer: Medicare Other | Admitting: Pediatrics

## 2016-01-31 VITALS — BP 116/78 | HR 78 | Temp 98.0°F | Ht 69.0 in | Wt 229.0 lb

## 2016-01-31 DIAGNOSIS — R7303 Prediabetes: Secondary | ICD-10-CM | POA: Diagnosis not present

## 2016-01-31 DIAGNOSIS — I10 Essential (primary) hypertension: Secondary | ICD-10-CM

## 2016-01-31 DIAGNOSIS — Z79899 Other long term (current) drug therapy: Secondary | ICD-10-CM

## 2016-01-31 DIAGNOSIS — M129 Arthropathy, unspecified: Secondary | ICD-10-CM

## 2016-01-31 DIAGNOSIS — J309 Allergic rhinitis, unspecified: Secondary | ICD-10-CM

## 2016-01-31 DIAGNOSIS — M1711 Unilateral primary osteoarthritis, right knee: Secondary | ICD-10-CM

## 2016-01-31 DIAGNOSIS — E785 Hyperlipidemia, unspecified: Secondary | ICD-10-CM

## 2016-01-31 LAB — BMP8+EGFR
BUN/Creatinine Ratio: 15 (ref 12–28)
BUN: 14 mg/dL (ref 8–27)
CALCIUM: 9.7 mg/dL (ref 8.7–10.3)
CO2: 26 mmol/L (ref 18–29)
CREATININE: 0.93 mg/dL (ref 0.57–1.00)
Chloride: 99 mmol/L (ref 96–106)
GFR calc Af Amer: 69 mL/min/{1.73_m2} (ref >59)
GFR, EST NON AFRICAN AMERICAN: 59 mL/min/{1.73_m2} — AB (ref >59)
Glucose: 123 mg/dL — ABNORMAL HIGH (ref 65–99)
Potassium: 4.2 mmol/L (ref 3.5–5.2)
Sodium: 141 mmol/L (ref 134–144)

## 2016-01-31 NOTE — Progress Notes (Signed)
    Subjective:    Patient ID: Mary Moss, female    DOB: 25-Nov-1935, 80 y.o.   MRN: [id number]  CC: Follow-up   HPI: Mary Moss is a 80 y.o. female presenting for Follow-up  Elevated BMI: Trying to get knee better, walking limited now Likes ice cream Trying to increase fruits and veg  Knee arthritis: Gel shots in knee weekly for next three weeks, hoping it will help  HLD:  Statins caused muscle weakness, not on meds  HTN: no headaches, vision changes, chest pain, dizziness, lightheadedness or fainting  Allergies: ongoing past few weeks, sneezing more, sometimes coughing Was on allergy shots years ago Not taking any OTC now  Depression screen [facility] 2/9 01/31/2016 09/02/2015 08/05/2015 07/03/2015 05/19/2014  Decreased Interest 0 0 0 0 0  Down, Depressed, Hopeless 0 0 0 0 0  PHQ - 2 Score 0 0 0 0 0     Relevant past medical, surgical, family and social history reviewed and updated as indicated.  Interim medical history since our last visit reviewed. Allergies and medications reviewed and updated.  ROS: Per HPI unless specifically indicated above  History  Smoking status  . Former Smoker -- 0.50 packs/day for 40 years  . Types: Cigarettes  . Start date: 07/27/1957  . Quit date: 12/15/2007  Smokeless tobacco  . Never Used       Objective:    BP 116/78 mmHg  Pulse 78  Temp(Src) 98 F (36.7 C) (Oral)  Ht '5\' 9"'$  (1.753 m)  Wt 229 lb (103.874 kg)  BMI 33.80 kg/m2  Wt Readings from Last 3 Encounters:  01/31/16 229 lb (103.874 kg)  09/02/15 223 lb 3.2 oz (101.243 kg)  08/05/15 224 lb 6.4 oz (101.787 kg)     Gen: NAD, alert, cooperative with exam, NCAT EYES: EOMI, no scleral injection or icterus ENT:  TMs pearly gray b/l, OP without erythema LYMPH: no cervical LAD CV: NRRR, normal S1/S2, no murmur, distal pulses 2+ b/l Resp: CTABL, no wheezes, normal WOB Ext: No edema, warm Neuro: Alert and oriented     Assessment & Plan:    Cletis was seen  today for follow-up multiple med problems  Diagnoses and all orders for this visit:  Essential hypertension Well controlled, cont lisinopril, HCTZ. Check BMP today with start of lisinopril last visit  Encounter for medication management -     BMP8+EGFR  Pre-diabetes Cont lifestyle changes, increasing activity and minimizing sugar  Arthritis of knee, right Followed by ortho, getting gel injections in knee this month  Allergic rhinitis, unspecified allergic rhinitis type Use OTC cetirizine and flonase as needed   Follow up plan: Return in about 6 months (around 08/02/2016).  Assunta Found, MD [facility] 01/31/2016, 8:28 AM

## 2016-02-04 DIAGNOSIS — M1711 Unilateral primary osteoarthritis, right knee: Secondary | ICD-10-CM | POA: Diagnosis not present

## 2016-02-10 DIAGNOSIS — M1711 Unilateral primary osteoarthritis, right knee: Secondary | ICD-10-CM | POA: Diagnosis not present

## 2016-03-12 DIAGNOSIS — H2513 Age-related nuclear cataract, bilateral: Secondary | ICD-10-CM | POA: Diagnosis not present

## 2016-03-12 DIAGNOSIS — H2512 Age-related nuclear cataract, left eye: Secondary | ICD-10-CM | POA: Diagnosis not present

## 2016-03-17 DIAGNOSIS — M1711 Unilateral primary osteoarthritis, right knee: Secondary | ICD-10-CM | POA: Diagnosis not present

## 2016-03-24 ENCOUNTER — Other Ambulatory Visit: Payer: Self-pay | Admitting: Pediatrics

## 2016-03-24 DIAGNOSIS — I10 Essential (primary) hypertension: Secondary | ICD-10-CM

## 2016-04-08 DIAGNOSIS — H2511 Age-related nuclear cataract, right eye: Secondary | ICD-10-CM | POA: Diagnosis not present

## 2016-04-08 DIAGNOSIS — H2512 Age-related nuclear cataract, left eye: Secondary | ICD-10-CM | POA: Diagnosis not present

## 2016-04-15 DIAGNOSIS — H2511 Age-related nuclear cataract, right eye: Secondary | ICD-10-CM | POA: Diagnosis not present

## 2016-04-28 NOTE — Progress Notes (Signed)
Pt is being scheduled for preop appt; please place surgical orders in epic. Thanks.  

## 2016-05-03 ENCOUNTER — Ambulatory Visit: Payer: Self-pay | Admitting: Orthopedic Surgery

## 2016-05-04 DIAGNOSIS — M1711 Unilateral primary osteoarthritis, right knee: Secondary | ICD-10-CM | POA: Diagnosis not present

## 2016-05-19 ENCOUNTER — Other Ambulatory Visit: Payer: Self-pay | Admitting: Pediatrics

## 2016-05-19 ENCOUNTER — Ambulatory Visit (INDEPENDENT_AMBULATORY_CARE_PROVIDER_SITE_OTHER): Payer: Medicare Other

## 2016-05-19 DIAGNOSIS — Z23 Encounter for immunization: Secondary | ICD-10-CM

## 2016-05-29 NOTE — Patient Instructions (Addendum)
Mary Moss  05/29/2016   Your procedure is scheduled on: 06/08/16  Report to [facility]  Entrance take [place]  elevators to 3rd floor to  [facility] [place].  Call this number if you have problems the morning of surgery [phone]   Remember: ONLY 1 PERSON MAY GO WITH YOU TO SHORT STAY TO GET  READY MORNING OF YOUR SURGERY.  Do not eat food   :After Midnight.  MAY HAVE CLEAR LIQUIDS MORNING OF SURGERY UNTIL 0745 AM--THEN NOTHING     Take these medicines the morning of surgery with A SIP OF WATER: NONE                                You may not have any metal on your body including hair pins and              piercings  Do not wear jewelry, make-up, lotions, powders or perfumes, deodorant             Do not wear nail polish.  Do not shave  48 hours prior to surgery.              Men may shave face and neck.   Do not bring valuables to the hospital. [place].  Contacts, dentures or bridgework may not be worn into surgery.  Leave suitcase in the car. After surgery it may be brought to your room.                Please read over the following fact sheets you were given: _____________________________________________________________________             [facility] - Preparing for Surgery Before surgery, you can play an important role.  Because skin is not sterile, your skin needs to be as free of germs as possible.  You can reduce the number of germs on your skin by washing with CHG (chlorahexidine gluconate) soap before surgery.  CHG is an antiseptic cleaner which kills germs and bonds with the skin to continue killing germs even after washing. Please DO NOT use if you have an allergy to CHG or antibacterial soaps.  If your skin becomes reddened/irritated stop using the CHG and inform your nurse when you arrive at Short Stay. Do not shave (including legs and underarms) for at least 48 hours  prior to the first CHG shower.  You may shave your face/neck. Please follow these instructions carefully:  1.  Shower with CHG Soap the night before surgery and the  morning of Surgery.  2.  If you choose to wash your hair, wash your hair first as usual with your  normal  shampoo.  3.  After you shampoo, rinse your hair and body thoroughly to remove the  shampoo.                           4.  Use CHG as you would any other liquid soap.  You can apply chg directly  to the skin and wash                       Gently with a scrungie  or clean washcloth.  5.  Apply the CHG Soap to your body ONLY FROM THE NECK DOWN.   Do not use on face/ open                           Wound or open sores. Avoid contact with eyes, ears mouth and genitals (private parts).                       Wash face,  Genitals (private parts) with your normal soap.             6.  Wash thoroughly, paying special attention to the area where your surgery  will be performed.  7.  Thoroughly rinse your body with warm water from the neck down.  8.  DO NOT shower/wash with your normal soap after using and rinsing off  the CHG Soap.                9.  Pat yourself dry with a clean towel.            10.  Wear clean pajamas.            11.  Place clean sheets on your bed the night of your first shower and do not  sleep with pets. Day of Surgery : Do not apply any lotions/deodorants the morning of surgery.  Please wear clean clothes to the hospital/surgery center.  FAILURE TO FOLLOW THESE INSTRUCTIONS MAY RESULT IN THE CANCELLATION OF YOUR SURGERY PATIENT SIGNATURE_________________________________  NURSE SIGNATURE__________________________________  ________________________________________________________________________   Adam Phenix  An incentive spirometer is a tool that can help keep your lungs clear and active. This tool measures how well you are filling your lungs with each breath. Taking long deep breaths may help reverse  or decrease the chance of developing breathing (pulmonary) problems (especially infection) following:  A long period of time when you are unable to move or be active. BEFORE THE PROCEDURE   If the spirometer includes an indicator to show your best effort, your nurse or respiratory therapist will set it to a desired goal.  If possible, sit up straight or lean slightly forward. Try not to slouch.  Hold the incentive spirometer in an upright position. INSTRUCTIONS FOR USE  1. Sit on the edge of your bed if possible, or sit up as far as you can in bed or on a chair. 2. Hold the incentive spirometer in an upright position. 3. Breathe out normally. 4. Place the mouthpiece in your mouth and seal your lips tightly around it. 5. Breathe in slowly and as deeply as possible, raising the piston or the ball toward the top of the column. 6. Hold your breath for 3-5 seconds or for as long as possible. Allow the piston or ball to fall to the bottom of the column. 7. Remove the mouthpiece from your mouth and breathe out normally. 8. Rest for a few seconds and repeat Steps 1 through 7 at least 10 times every 1-2 hours when you are awake. Take your time and take a few normal breaths between deep breaths. 9. The spirometer may include an indicator to show your best effort. Use the indicator as a goal to work toward during each repetition. 10. After each set of 10 deep breaths, practice coughing to be sure your lungs are clear. If you have an incision (the cut made at the time of surgery), support your incision when  coughing by placing a pillow or rolled up towels firmly against it. Once you are able to get out of bed, walk around indoors and cough well. You may stop using the incentive spirometer when instructed by your caregiver.  RISKS AND COMPLICATIONS  Take your time so you do not get dizzy or light-headed.  If you are in pain, you may need to take or ask for pain medication before doing incentive  spirometry. It is harder to take a deep breath if you are having pain. AFTER USE  Rest and breathe slowly and easily.  It can be helpful to keep track of a log of your progress. Your caregiver can provide you with a simple table to help with this. If you are using the spirometer at home, follow these instructions: [employer] IF:   You are having difficultly using the spirometer.  You have trouble using the spirometer as often as instructed.  Your pain medication is not giving enough relief while using the spirometer.  You develop fever of 100.5 F (38.1 C) or higher. SEEK IMMEDIATE MEDICAL CARE IF:   You cough up bloody sputum that had not been present before.  You develop fever of 102 F (38.9 C) or greater.  You develop worsening pain at or near the incision site. MAKE SURE YOU:   Understand these instructions.  Will watch your condition.  Will get help right away if you are not doing well or get worse. Document Released: 11/23/2006 Document Revised: 10/05/2011 Document Reviewed: 01/24/2007 ExitCare Patient Information 2014 ExitCare, [place].   ________________________________________________________________________  WHAT IS A BLOOD TRANSFUSION? Blood Transfusion Information  A transfusion is the replacement of blood or some of its parts. Blood is made up of multiple cells which provide different functions.  Red blood cells carry oxygen and are used for blood loss replacement.  White blood cells fight against infection.  Platelets control bleeding.  Plasma helps clot blood.  Other blood products are available for specialized needs, such as hemophilia or other clotting disorders. BEFORE THE TRANSFUSION  Who gives blood for transfusions?   Healthy volunteers who are fully evaluated to make sure their blood is safe. This is blood bank blood. Transfusion therapy is the safest it has ever been in the practice of medicine. Before blood is taken from a donor, a  complete history is taken to make sure that person has no history of diseases nor engages in risky social behavior (examples are intravenous drug use or sexual activity with multiple partners). The donor's travel history is screened to minimize risk of transmitting infections, such as malaria. The donated blood is tested for signs of infectious diseases, such as HIV and hepatitis. The blood is then tested to be sure it is compatible with you in order to minimize the chance of a transfusion reaction. If you or a relative donates blood, this is often done in anticipation of surgery and is not appropriate for emergency situations. It takes many days to process the donated blood. RISKS AND COMPLICATIONS Although transfusion therapy is very safe and saves many lives, the main dangers of transfusion include:   Getting an infectious disease.  Developing a transfusion reaction. This is an allergic reaction to something in the blood you were given. Every precaution is taken to prevent this. The decision to have a blood transfusion has been considered carefully by your caregiver before blood is given. Blood is not given unless the benefits outweigh the risks. AFTER THE TRANSFUSION  Right after receiving  a blood transfusion, you will usually feel much better and more energetic. This is especially true if your red blood cells have gotten low (anemic). The transfusion raises the level of the red blood cells which carry oxygen, and this usually causes an energy increase.  The nurse administering the transfusion will monitor you carefully for complications. HOME CARE INSTRUCTIONS  No special instructions are needed after a transfusion. You may find your energy is better. Speak with your caregiver about any limitations on activity for underlying diseases you may have. SEEK MEDICAL CARE IF:   Your condition is not improving after your transfusion.  You develop redness or irritation at the intravenous (IV)  site. SEEK IMMEDIATE MEDICAL CARE IF:  Any of the following symptoms occur over the next 12 hours:  Shaking chills.  You have a temperature by mouth above 102 F (38.9 C), not controlled by medicine.  Chest, back, or muscle pain.  People around you feel you are not acting correctly or are confused.  Shortness of breath or difficulty breathing.  Dizziness and fainting.  You get a rash or develop hives.  You have a decrease in urine output.  Your urine turns a dark color or changes to pink, red, or brown. Any of the following symptoms occur over the next 10 days:  You have a temperature by mouth above 102 F (38.9 C), not controlled by medicine.  Shortness of breath.  Weakness after normal activity.  The white part of the eye turns yellow (jaundice).  You have a decrease in the amount of urine or are urinating less often.  Your urine turns a dark color or changes to pink, red, or brown. Document Released: 07/10/2000 Document Revised: 10/05/2011 Document Reviewed: 02/27/2008 ExitCare Patient Information 2014 ExitCare, [place].  _______________________________________________________________________   CLEAR LIQUID DIET   Foods Allowed                                                                     Foods Excluded  Coffee and tea, regular and decaf                             liquids that you cannot  Plain Jell-O in any flavor                                             see through such as: Fruit ices (not with fruit pulp)                                     milk, soups, orange juice  Iced Popsicles                                    All solid food Carbonated beverages, regular and diet  Cranberry, grape and apple juices Sports drinks like Gatorade Lightly seasoned clear broth or consume(fat free) Sugar, honey syrup  Sample Menu Breakfast                                Lunch                                     Supper Cranberry  juice                    Beef broth                            Chicken broth Jell-O                                     Grape juice                           Apple juice Coffee or tea                        Jell-O                                      Popsicle                                                Coffee or tea                        Coffee or tea  _____________________________________________________________________

## 2016-06-01 ENCOUNTER — Encounter (HOSPITAL_COMMUNITY)
Admission: RE | Admit: 2016-06-01 | Discharge: 2016-06-01 | Disposition: A | Discharge status: Home / Self Care | Payer: Medicare Other | Source: Ambulatory Visit | Attending: Orthopedic Surgery | Admitting: Orthopedic Surgery

## 2016-06-01 ENCOUNTER — Encounter (HOSPITAL_COMMUNITY): Payer: Self-pay

## 2016-06-01 DIAGNOSIS — M1711 Unilateral primary osteoarthritis, right knee: Secondary | ICD-10-CM | POA: Diagnosis not present

## 2016-06-01 DIAGNOSIS — Z01812 Encounter for preprocedural laboratory examination: Secondary | ICD-10-CM | POA: Diagnosis not present

## 2016-06-01 DIAGNOSIS — Z01810 Encounter for preprocedural cardiovascular examination: Secondary | ICD-10-CM | POA: Insufficient documentation

## 2016-06-01 HISTORY — DX: Essential (primary) hypertension: I10

## 2016-06-01 HISTORY — DX: Pneumonia, unspecified organism: J18.9

## 2016-06-01 LAB — URINALYSIS, ROUTINE W REFLEX MICROSCOPIC
Bilirubin Urine: NEGATIVE
GLUCOSE, UA: NEGATIVE mg/dL
HGB URINE DIPSTICK: NEGATIVE
Ketones, ur: NEGATIVE mg/dL
Nitrite: NEGATIVE
Protein, ur: NEGATIVE mg/dL
SPECIFIC GRAVITY, URINE: 1.026 (ref 1.005–1.030)
pH: 7 (ref 5.0–8.0)

## 2016-06-01 LAB — COMPREHENSIVE METABOLIC PANEL
ALK PHOS: 86 U/L (ref 38–126)
ALT: 16 U/L (ref 14–54)
ANION GAP: 6 (ref 5–15)
AST: 18 U/L (ref 15–41)
Albumin: 3.7 g/dL (ref 3.5–5.0)
BILIRUBIN TOTAL: 1 mg/dL (ref 0.3–1.2)
BUN: 22 mg/dL — ABNORMAL HIGH (ref 6–20)
CALCIUM: 9.5 mg/dL (ref 8.9–10.3)
CO2: 31 mmol/L (ref 22–32)
Chloride: 104 mmol/L (ref 101–111)
Creatinine, Ser: 1.01 mg/dL — ABNORMAL HIGH (ref 0.44–1.00)
GFR calc non Af Amer: 52 mL/min — ABNORMAL LOW (ref >60)
Glucose, Bld: 93 mg/dL (ref 65–99)
Potassium: 4.2 mmol/L (ref 3.5–5.1)
SODIUM: 141 mmol/L (ref 135–145)
TOTAL PROTEIN: 7.7 g/dL (ref 6.5–8.1)

## 2016-06-01 LAB — CBC
HCT: 43.9 % (ref 36.0–46.0)
HEMOGLOBIN: 14.1 g/dL (ref 12.0–15.0)
MCH: 30.7 pg (ref 26.0–34.0)
MCHC: 32.1 g/dL (ref 30.0–36.0)
MCV: 95.4 fL (ref 78.0–100.0)
Platelets: 285 10*3/uL (ref 150–400)
RBC: 4.6 MIL/uL (ref 3.87–5.11)
RDW: 13.3 % (ref 11.5–15.5)
WBC: 8.1 10*3/uL (ref 4.0–10.5)

## 2016-06-01 LAB — ABO/RH: ABO/RH(D): A POS

## 2016-06-01 LAB — SURGICAL PCR SCREEN
MRSA, PCR: POSITIVE — AB
Staphylococcus aureus: POSITIVE — AB

## 2016-06-01 LAB — PROTIME-INR
INR: 1.02
PROTHROMBIN TIME: 13.5 s (ref 11.4–15.2)

## 2016-06-01 LAB — URINE MICROSCOPIC-ADD ON

## 2016-06-01 LAB — APTT: aPTT: 28 seconds (ref 24–36)

## 2016-06-07 ENCOUNTER — Ambulatory Visit: Payer: Self-pay | Admitting: Orthopedic Surgery

## 2016-06-07 NOTE — H&P (Signed)
Mary Moss DOB: Jun 25, 1936 Single / Language: Cleophus Molt / Race: White Female Date of Admission:  06/08/2016 CC:  Right Knee pain History of Present Illness The patient is a 80 year old female who comes in for a preoperative History and Physical. The patient is scheduled for a right total knee arthroplasty to be performed by Dr. Dione Plover. Aluisio, MD at [facility] on 06-08-2016. The patient is a 80 year old female who presented for follow up of their knee. The patient is being followed for their right osteoarthritis. Symptoms reported include: pain. The patient feels that they are doing 0 percent better. The patient has completed a series of Gelsyn injections. Unfortunately, her right knee is getting progressively worse over time. The injections do not help her at all. The right knee hurts at all times. We last saw her in the fall of last year and she was having considerable pain, but it has gotten a lot worse now. She is now failed cortisone and viscous supplements. At this point, she is at a stage where she feels like she needs to do something with the knee because it is limiting what she can and cannot do and having a very negative effect on her lifestyle. She is ready to proceed with surgery at this time. They have been treated conservatively in the past for the above stated problem and despite conservative measures, they continue to have progressive pain and severe functional limitations and dysfunction. They have failed non-operative management including home exercise, medications, and injections. It is felt that they would benefit from undergoing total joint replacement. Risks and benefits of the procedure have been discussed with the patient and they elect to proceed with surgery. There are no active contraindications to surgery such as ongoing infection or rapidly progressive neurological disease.  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 Lary Eckardt, III PA-C; 05/26/2016 4:21 PM) Polysporin *DERMATOLOGICALS*  Neosporin - redness and blisters  Family History  Drug / Alcohol Addiction  Sister. Cancer  Father, Mother.  Social History  Children  2 Current drinker  08/10/2013: Currently drinks wine only occasionally per week Current work status  working part time Exercise  Exercises rarely; does running / walking Living situation  live alone Marital status  widowed No history of drug/alcohol rehab  Not under pain contract  Number of flights of stairs before winded  4-5 Tobacco / smoke exposure  08/10/2013: no Tobacco use  Former smoker. 08/10/2013: smoke(d) 1/2 pack(s) per day Advance Directives  Living Will, Healthcare POA Post-Surgical Plans  Wants to look into [facility] following the Right TKA to be performed on 06/08/2016  Medication History Aleve (Oral as needed) Specific strength unknown - Active. Lisinopril (10MG  Tablet, Oral) Active. HydroCHLOROthiazide (25MG  Tablet, Oral) Active.  Past Surgical History Ankle Surgery  left Rotator Cuff Repair  right Dilation and Curettage of Uterus  Hysterectomy  complete (non-cancerous) Ankle Replacement  Date: 11/2014.    Review of Systems  General Not Present- Chills, Fatigue, Fever, Memory Loss, Night Sweats, Weight Gain and Weight Loss. Skin Not Present- Eczema, Hives, Itching, Lesions and Rash.  HEENT Not Present- Dentures, Double Vision, Headache, Hearing Loss, Tinnitus and Visual Loss. Respiratory Not  Present- Allergies, Chronic Cough, Coughing up blood, Shortness of breath at rest and Shortness of breath with exertion. Cardiovascular Not Present- Chest Pain, Difficulty Breathing Lying Down, Murmur, Palpitations, Racing/skipping heartbeats and Swelling. Gastrointestinal Not Present- Abdominal Pain, Bloody Stool, Constipation, Diarrhea, Difficulty Swallowing, Heartburn, Jaundice, Loss of appetitie, Nausea and Vomiting. Female Genitourinary Not Present- Blood in Urine, Discharge, Flank Pain, Incontinence, Painful Urination, Urgency, Urinary frequency, Urinary Retention, Urinating at Night and Weak urinary stream. Musculoskeletal Present- Back Pain, Joint Pain, Joint Swelling and Morning Stiffness. Not Present- Muscle Pain, Muscle Weakness and Spasms. Neurological Not Present- Blackout spells, Difficulty with balance, Dizziness, Paralysis, Tremor and Weakness. Psychiatric Not Present- Insomnia.  Vitals  Weight: 220 lb Height: 69in Weight was reported by patient. Height was reported by patient. Body Surface Area: 2.15 m Body Mass Index: 32.49 kg/m  Pulse: 84 (Regular)  BP: 128/76 (Sitting, Right Arm, Standard)  Physical Exam General Mental Status -Alert, cooperative and good historian. General Appearance-pleasant, Not in acute distress. Orientation-Oriented X3. Build & Nutrition-Well nourished and Well developed.  Head and Neck Head-normocephalic, atraumatic . Neck Global Assessment - supple, no bruit auscultated on the right, no bruit auscultated on the left.  Eye Vision-Wears corrective lenses. Pupil - Bilateral-Regular and Round. Motion - Bilateral-EOMI.  Chest and Lung Exam Auscultation Breath sounds - clear at anterior chest wall and clear at posterior chest wall. Adventitious sounds - No Adventitious sounds.  Cardiovascular Auscultation Rhythm - Regular rate and rhythm. Heart Sounds - S1 WNL and S2 WNL. Murmurs & Other Heart Sounds - Auscultation  of the heart reveals - No Murmurs.  Abdomen Inspection Contour - Generalized mild distention. Palpation/Percussion Tenderness - Abdomen is non-tender to palpation. Rigidity (guarding) - Abdomen is soft. Auscultation Auscultation of the abdomen reveals - Bowel sounds normal.  Female Genitourinary Note: Not done, not pertinent to present illness   Musculoskeletal Note: On exam, she is alert and oriented, in no apparent distress. Her right knee shows no effusion. Range 5 to 130. There is marked crepitus on range of motion with tenderness medial greater than lateral and no instability. Pulse, sensation, motor intact distally. Left knee exam is unremarkable.  Radiographs showed that she has got significant medial and patellofemoral arthritis of that right knee.  Assessment & Plan Primary osteoarthritis of right knee (M17.11)  Note:Surgical Plans: Right Total Knee Replacement  Disposition: Wants to look into [facility]  PCP: Dr. Evette Doffing, Josie Saunders  IV TXA  Anesthesia Issues: None  Signed electronically by Ok Edwards, III PA-C

## 2016-06-08 ENCOUNTER — Inpatient Hospital Stay (HOSPITAL_COMMUNITY): Payer: Medicare Other | Admitting: Certified Registered Nurse Anesthetist

## 2016-06-08 ENCOUNTER — Encounter (HOSPITAL_COMMUNITY)
Admission: RE | Disposition: A | Discharge status: Skilled Nursing Facility | Payer: Self-pay | Source: Ambulatory Visit | Attending: Orthopedic Surgery

## 2016-06-08 ENCOUNTER — Inpatient Hospital Stay (HOSPITAL_COMMUNITY)
Admission: RE | Admit: 2016-06-08 | Discharge: 2016-06-10 | DRG: 470 | Disposition: A | Discharge status: Skilled Nursing Facility | Payer: Medicare Other | Source: Ambulatory Visit | Attending: Orthopedic Surgery | Admitting: Orthopedic Surgery

## 2016-06-08 ENCOUNTER — Encounter (HOSPITAL_COMMUNITY): Payer: Self-pay | Admitting: *Deleted

## 2016-06-08 DIAGNOSIS — Z96651 Presence of right artificial knee joint: Secondary | ICD-10-CM | POA: Diagnosis not present

## 2016-06-08 DIAGNOSIS — Z811 Family history of alcohol abuse and dependence: Secondary | ICD-10-CM | POA: Diagnosis not present

## 2016-06-08 DIAGNOSIS — Z809 Family history of malignant neoplasm, unspecified: Secondary | ICD-10-CM

## 2016-06-08 DIAGNOSIS — R278 Other lack of coordination: Secondary | ICD-10-CM | POA: Diagnosis not present

## 2016-06-08 DIAGNOSIS — Z85828 Personal history of other malignant neoplasm of skin: Secondary | ICD-10-CM | POA: Diagnosis not present

## 2016-06-08 DIAGNOSIS — K219 Gastro-esophageal reflux disease without esophagitis: Secondary | ICD-10-CM | POA: Diagnosis present

## 2016-06-08 DIAGNOSIS — M6281 Muscle weakness (generalized): Secondary | ICD-10-CM | POA: Diagnosis not present

## 2016-06-08 DIAGNOSIS — Z471 Aftercare following joint replacement surgery: Secondary | ICD-10-CM | POA: Diagnosis not present

## 2016-06-08 DIAGNOSIS — K5900 Constipation, unspecified: Secondary | ICD-10-CM | POA: Diagnosis not present

## 2016-06-08 DIAGNOSIS — M1710 Unilateral primary osteoarthritis, unspecified knee: Secondary | ICD-10-CM | POA: Diagnosis present

## 2016-06-08 DIAGNOSIS — M1711 Unilateral primary osteoarthritis, right knee: Principal | ICD-10-CM | POA: Diagnosis present

## 2016-06-08 DIAGNOSIS — R262 Difficulty in walking, not elsewhere classified: Secondary | ICD-10-CM | POA: Diagnosis not present

## 2016-06-08 DIAGNOSIS — M179 Osteoarthritis of knee, unspecified: Secondary | ICD-10-CM

## 2016-06-08 DIAGNOSIS — I10 Essential (primary) hypertension: Secondary | ICD-10-CM | POA: Diagnosis present

## 2016-06-08 DIAGNOSIS — M25561 Pain in right knee: Secondary | ICD-10-CM | POA: Diagnosis not present

## 2016-06-08 DIAGNOSIS — E785 Hyperlipidemia, unspecified: Secondary | ICD-10-CM | POA: Diagnosis not present

## 2016-06-08 DIAGNOSIS — E559 Vitamin D deficiency, unspecified: Secondary | ICD-10-CM | POA: Diagnosis not present

## 2016-06-08 DIAGNOSIS — R52 Pain, unspecified: Secondary | ICD-10-CM | POA: Diagnosis not present

## 2016-06-08 DIAGNOSIS — R110 Nausea: Secondary | ICD-10-CM | POA: Diagnosis not present

## 2016-06-08 DIAGNOSIS — M62838 Other muscle spasm: Secondary | ICD-10-CM | POA: Diagnosis not present

## 2016-06-08 DIAGNOSIS — F411 Generalized anxiety disorder: Secondary | ICD-10-CM | POA: Diagnosis not present

## 2016-06-08 HISTORY — PX: TOTAL KNEE ARTHROPLASTY: SHX125

## 2016-06-08 LAB — TYPE AND SCREEN
ABO/RH(D): A POS
Antibody Screen: NEGATIVE

## 2016-06-08 SURGERY — ARTHROPLASTY, KNEE, TOTAL
Anesthesia: Spinal | Site: Knee | Laterality: Right

## 2016-06-08 MED ORDER — SODIUM CHLORIDE 0.9 % IJ SOLN
INTRAMUSCULAR | Status: DC | PRN
  Administered 2016-06-08: 30 mL

## 2016-06-08 MED ORDER — CEFAZOLIN SODIUM-DEXTROSE 2-4 GM/100ML-% IV SOLN
INTRAVENOUS | Status: AC
  Filled 2016-06-08: qty 100

## 2016-06-08 MED ORDER — OXYCODONE HCL 5 MG PO TABS
5.0000 mg | ORAL_TABLET | ORAL | Status: DC | PRN
  Administered 2016-06-08 – 2016-06-09 (×5): 5 mg via ORAL
  Administered 2016-06-09: 10 mg via ORAL
  Administered 2016-06-10 (×3): 5 mg via ORAL
  Filled 2016-06-08 (×9): qty 1

## 2016-06-08 MED ORDER — ACETAMINOPHEN 10 MG/ML IV SOLN
INTRAVENOUS | Status: AC
  Filled 2016-06-08: qty 100

## 2016-06-08 MED ORDER — BUPIVACAINE LIPOSOME 1.3 % IJ SUSP
20.0000 mL | Freq: Once | INTRAMUSCULAR | Status: DC
  Filled 2016-06-08: qty 20

## 2016-06-08 MED ORDER — HYDROMORPHONE HCL 1 MG/ML IJ SOLN: 0.5000 mg | INTRAMUSCULAR | Status: DC | PRN

## 2016-06-08 MED ORDER — PROPOFOL 10 MG/ML IV BOLUS
INTRAVENOUS | Status: AC
Start: 2016-06-08 — End: 2016-06-08
  Filled 2016-06-08: qty 40

## 2016-06-08 MED ORDER — RIVAROXABAN 10 MG PO TABS
10.0000 mg | ORAL_TABLET | Freq: Every day | ORAL | Status: DC
  Administered 2016-06-09 – 2016-06-10 (×2): 10 mg via ORAL
  Filled 2016-06-08 (×2): qty 1

## 2016-06-08 MED ORDER — METOCLOPRAMIDE HCL 5 MG/ML IJ SOLN: 10.0000 mg | Freq: Once | INTRAMUSCULAR | Status: DC | PRN

## 2016-06-08 MED ORDER — MEPERIDINE HCL 50 MG/ML IJ SOLN: 6.2500 mg | INTRAMUSCULAR | Status: DC | PRN

## 2016-06-08 MED ORDER — BUPIVACAINE HCL (PF) 0.25 % IJ SOLN
INTRAMUSCULAR | Status: AC
Start: 2016-06-08 — End: 2016-06-08
  Filled 2016-06-08: qty 30

## 2016-06-08 MED ORDER — CEFAZOLIN SODIUM-DEXTROSE 2-4 GM/100ML-% IV SOLN
2.0000 g | Freq: Four times a day (QID) | INTRAVENOUS | Status: AC
  Administered 2016-06-08 – 2016-06-09 (×2): 2 g via INTRAVENOUS
  Filled 2016-06-08 (×2): qty 100

## 2016-06-08 MED ORDER — PHENYLEPHRINE 40 MCG/ML (10ML) SYRINGE FOR IV PUSH (FOR BLOOD PRESSURE SUPPORT)
PREFILLED_SYRINGE | INTRAVENOUS | Status: DC | PRN
  Administered 2016-06-08 (×2): 40 ug via INTRAVENOUS
  Administered 2016-06-08: 80 ug via INTRAVENOUS
  Administered 2016-06-08: 40 ug via INTRAVENOUS
  Administered 2016-06-08: 80 ug via INTRAVENOUS
  Administered 2016-06-08: 40 ug via INTRAVENOUS
  Administered 2016-06-08: 120 ug via INTRAVENOUS

## 2016-06-08 MED ORDER — 0.9 % SODIUM CHLORIDE (POUR BTL) OPTIME
TOPICAL | Status: DC | PRN
  Administered 2016-06-08: 1000 mL

## 2016-06-08 MED ORDER — ACETAMINOPHEN 325 MG PO TABS
650.0000 mg | ORAL_TABLET | Freq: Four times a day (QID) | ORAL | Status: DC | PRN
  Administered 2016-06-10 (×2): 650 mg via ORAL
  Filled 2016-06-08 (×2): qty 2

## 2016-06-08 MED ORDER — LACTATED RINGERS IV SOLN
INTRAVENOUS | Status: DC
  Administered 2016-06-08 (×2): via INTRAVENOUS

## 2016-06-08 MED ORDER — BISACODYL 10 MG RE SUPP: 10.0000 mg | Freq: Every day | RECTAL | Status: DC | PRN

## 2016-06-08 MED ORDER — METOCLOPRAMIDE HCL 5 MG PO TABS: 5.0000 mg | ORAL_TABLET | Freq: Three times a day (TID) | ORAL | Status: DC | PRN

## 2016-06-08 MED ORDER — ONDANSETRON HCL 4 MG/2ML IJ SOLN
INTRAMUSCULAR | Status: AC
  Filled 2016-06-08: qty 2

## 2016-06-08 MED ORDER — DIPHENHYDRAMINE HCL 12.5 MG/5ML PO ELIX: 12.5000 mg | ORAL_SOLUTION | ORAL | Status: DC | PRN

## 2016-06-08 MED ORDER — SODIUM CHLORIDE 0.9 % IJ SOLN
INTRAMUSCULAR | Status: AC
  Filled 2016-06-08: qty 50

## 2016-06-08 MED ORDER — DOCUSATE SODIUM 100 MG PO CAPS
100.0000 mg | ORAL_CAPSULE | Freq: Two times a day (BID) | ORAL | Status: DC
  Administered 2016-06-08 – 2016-06-10 (×4): 100 mg via ORAL
  Filled 2016-06-08 (×4): qty 1

## 2016-06-08 MED ORDER — LIDOCAINE HCL 2 % IJ SOLN
INTRAMUSCULAR | Status: AC
  Filled 2016-06-08: qty 20

## 2016-06-08 MED ORDER — TRAMADOL HCL 50 MG PO TABS
50.0000 mg | ORAL_TABLET | Freq: Four times a day (QID) | ORAL | Status: DC | PRN
Start: 2016-06-08 — End: 2016-06-10
  Administered 2016-06-08: 50 mg via ORAL
  Filled 2016-06-08: qty 1

## 2016-06-08 MED ORDER — PROPOFOL 10 MG/ML IV BOLUS
INTRAVENOUS | Status: AC
  Filled 2016-06-08: qty 20

## 2016-06-08 MED ORDER — ACETAMINOPHEN 650 MG RE SUPP: 650.0000 mg | Freq: Four times a day (QID) | RECTAL | Status: DC | PRN

## 2016-06-08 MED ORDER — ONDANSETRON HCL 4 MG PO TABS
4.0000 mg | ORAL_TABLET | Freq: Four times a day (QID) | ORAL | Status: DC | PRN
  Administered 2016-06-10: 4 mg via ORAL
  Filled 2016-06-08: qty 1

## 2016-06-08 MED ORDER — METHOCARBAMOL 1000 MG/10ML IJ SOLN
500.0000 mg | Freq: Four times a day (QID) | INTRAVENOUS | Status: DC | PRN
  Administered 2016-06-08: 500 mg via INTRAVENOUS
  Filled 2016-06-08: qty 550
  Filled 2016-06-08: qty 5

## 2016-06-08 MED ORDER — BUPIVACAINE LIPOSOME 1.3 % IJ SUSP
INTRAMUSCULAR | Status: DC | PRN
  Administered 2016-06-08: 20 mL

## 2016-06-08 MED ORDER — ONDANSETRON HCL 4 MG/2ML IJ SOLN
INTRAMUSCULAR | Status: DC | PRN
  Administered 2016-06-08: 4 mg via INTRAVENOUS

## 2016-06-08 MED ORDER — METHOCARBAMOL 500 MG PO TABS
500.0000 mg | ORAL_TABLET | Freq: Four times a day (QID) | ORAL | Status: DC | PRN
  Administered 2016-06-10: 500 mg via ORAL
  Filled 2016-06-08: qty 1

## 2016-06-08 MED ORDER — FENTANYL CITRATE (PF) 100 MCG/2ML IJ SOLN
INTRAMUSCULAR | Status: DC | PRN
Start: 2016-06-08 — End: 2016-06-08
  Administered 2016-06-08: 50 ug via INTRAVENOUS

## 2016-06-08 MED ORDER — DEXAMETHASONE SODIUM PHOSPHATE 10 MG/ML IJ SOLN
INTRAMUSCULAR | Status: AC
  Filled 2016-06-08: qty 1

## 2016-06-08 MED ORDER — MENTHOL 3 MG MT LOZG
1.0000 | LOZENGE | OROMUCOSAL | Status: DC | PRN
Start: 2016-06-08 — End: 2016-06-10

## 2016-06-08 MED ORDER — PHENYLEPHRINE 40 MCG/ML (10ML) SYRINGE FOR IV PUSH (FOR BLOOD PRESSURE SUPPORT)
PREFILLED_SYRINGE | INTRAVENOUS | Status: AC
  Filled 2016-06-08: qty 10

## 2016-06-08 MED ORDER — CEFAZOLIN SODIUM-DEXTROSE 2-4 GM/100ML-% IV SOLN
2.0000 g | INTRAVENOUS | Status: AC
  Administered 2016-06-08: 2 g via INTRAVENOUS
  Filled 2016-06-08: qty 100

## 2016-06-08 MED ORDER — HYDROCHLOROTHIAZIDE 25 MG PO TABS
25.0000 mg | ORAL_TABLET | Freq: Every day | ORAL | Status: DC
  Administered 2016-06-08 – 2016-06-09 (×2): 25 mg via ORAL
  Filled 2016-06-08 (×2): qty 1

## 2016-06-08 MED ORDER — SODIUM CHLORIDE 0.9 % IV SOLN
INTRAVENOUS | Status: DC
  Administered 2016-06-08: 18:00:00 via INTRAVENOUS

## 2016-06-08 MED ORDER — FENTANYL CITRATE (PF) 100 MCG/2ML IJ SOLN
INTRAMUSCULAR | Status: AC
  Filled 2016-06-08: qty 2

## 2016-06-08 MED ORDER — BUPIVACAINE HCL 0.25 % IJ SOLN
INTRAMUSCULAR | Status: DC | PRN
  Administered 2016-06-08: 20 mL

## 2016-06-08 MED ORDER — PROPOFOL 500 MG/50ML IV EMUL
INTRAVENOUS | Status: DC | PRN
  Administered 2016-06-08: 50 ug/kg/min via INTRAVENOUS

## 2016-06-08 MED ORDER — PHENOL 1.4 % MT LIQD: 1.0000 | OROMUCOSAL | Status: DC | PRN

## 2016-06-08 MED ORDER — DEXAMETHASONE SODIUM PHOSPHATE 10 MG/ML IJ SOLN
10.0000 mg | Freq: Once | INTRAMUSCULAR | Status: AC
  Administered 2016-06-09: 10 mg via INTRAVENOUS
  Filled 2016-06-08: qty 1

## 2016-06-08 MED ORDER — ACETAMINOPHEN 10 MG/ML IV SOLN
1000.0000 mg | Freq: Once | INTRAVENOUS | Status: AC
  Administered 2016-06-08: 1000 mg via INTRAVENOUS
  Filled 2016-06-08: qty 100

## 2016-06-08 MED ORDER — POLYETHYLENE GLYCOL 3350 17 G PO PACK: 17.0000 g | PACK | Freq: Every day | ORAL | Status: DC | PRN

## 2016-06-08 MED ORDER — PROPOFOL 10 MG/ML IV BOLUS
INTRAVENOUS | Status: DC | PRN
  Administered 2016-06-08: 10 mg via INTRAVENOUS

## 2016-06-08 MED ORDER — FENTANYL CITRATE (PF) 100 MCG/2ML IJ SOLN: 25.0000 ug | INTRAMUSCULAR | Status: DC | PRN

## 2016-06-08 MED ORDER — BUPIVACAINE IN DEXTROSE 0.75-8.25 % IT SOLN
INTRATHECAL | Status: DC | PRN
  Administered 2016-06-08: 2 mL via INTRATHECAL

## 2016-06-08 MED ORDER — VANCOMYCIN HCL IN DEXTROSE 1-5 GM/200ML-% IV SOLN
1000.0000 mg | Freq: Once | INTRAVENOUS | Status: AC
  Administered 2016-06-08: 1000 mg via INTRAVENOUS
  Filled 2016-06-08: qty 200

## 2016-06-08 MED ORDER — CHLORHEXIDINE GLUCONATE 4 % EX LIQD: 60.0000 mL | Freq: Once | CUTANEOUS | Status: DC

## 2016-06-08 MED ORDER — TRANEXAMIC ACID 1000 MG/10ML IV SOLN
1000.0000 mg | Freq: Once | INTRAVENOUS | Status: AC
  Administered 2016-06-08: 1000 mg via INTRAVENOUS
  Filled 2016-06-08: qty 10

## 2016-06-08 MED ORDER — METOCLOPRAMIDE HCL 5 MG/ML IJ SOLN: 5.0000 mg | Freq: Three times a day (TID) | INTRAMUSCULAR | Status: DC | PRN

## 2016-06-08 MED ORDER — TRANEXAMIC ACID 1000 MG/10ML IV SOLN
1000.0000 mg | INTRAVENOUS | Status: AC
  Administered 2016-06-08: 1000 mg via INTRAVENOUS
  Filled 2016-06-08: qty 10

## 2016-06-08 MED ORDER — ACETAMINOPHEN 500 MG PO TABS
1000.0000 mg | ORAL_TABLET | Freq: Four times a day (QID) | ORAL | Status: AC
  Administered 2016-06-08 – 2016-06-09 (×4): 1000 mg via ORAL
  Filled 2016-06-08 (×4): qty 2

## 2016-06-08 MED ORDER — FLEET ENEMA 7-19 GM/118ML RE ENEM: 1.0000 | ENEMA | Freq: Once | RECTAL | Status: DC | PRN

## 2016-06-08 MED ORDER — ONDANSETRON HCL 4 MG/2ML IJ SOLN: 4.0000 mg | Freq: Four times a day (QID) | INTRAMUSCULAR | Status: DC | PRN

## 2016-06-08 MED ORDER — DEXAMETHASONE SODIUM PHOSPHATE 10 MG/ML IJ SOLN
10.0000 mg | Freq: Once | INTRAMUSCULAR | Status: AC
  Administered 2016-06-08: 10 mg via INTRAVENOUS

## 2016-06-08 SURGICAL SUPPLY — 51 items
BAG DECANTER FOR FLEXI CONT (MISCELLANEOUS) ×3 IMPLANT
BAG SPEC THK2 15X12 ZIP CLS (MISCELLANEOUS) ×1
BAG ZIPLOCK 12X15 (MISCELLANEOUS) ×3 IMPLANT
BANDAGE ACE 6X5 VEL STRL LF (GAUZE/BANDAGES/DRESSINGS) ×3 IMPLANT
BLADE SAG 18X100X1.27 (BLADE) ×3 IMPLANT
BLADE SAW SGTL 11.0X1.19X90.0M (BLADE) ×3 IMPLANT
BOWL SMART MIX CTS (DISPOSABLE) ×3 IMPLANT
CAP KNEE TOTAL 3 SIGMA ×3 IMPLANT
CEMENT HV SMART SET (Cement) ×6 IMPLANT
CLOSURE WOUND 1/2 X4 (GAUZE/BANDAGES/DRESSINGS) ×2
CLOTH BEACON ORANGE TIMEOUT ST (SAFETY) ×3 IMPLANT
CUFF TOURN SGL QUICK 34 (TOURNIQUET CUFF) ×3
CUFF TRNQT CYL 34X4X40X1 (TOURNIQUET CUFF) ×1 IMPLANT
DECANTER SPIKE VIAL GLASS SM (MISCELLANEOUS) ×3 IMPLANT
DRAPE U-SHAPE 47X51 STRL (DRAPES) ×3 IMPLANT
DRSG ADAPTIC 3X8 NADH LF (GAUZE/BANDAGES/DRESSINGS) ×3 IMPLANT
DRSG PAD ABDOMINAL 8X10 ST (GAUZE/BANDAGES/DRESSINGS) ×3 IMPLANT
DURAPREP 26ML APPLICATOR (WOUND CARE) ×3 IMPLANT
ELECT REM PT RETURN 9FT ADLT (ELECTROSURGICAL) ×3
ELECTRODE REM PT RTRN 9FT ADLT (ELECTROSURGICAL) ×1 IMPLANT
EVACUATOR 1/8 PVC DRAIN (DRAIN) ×3 IMPLANT
GAUZE SPONGE 4X4 12PLY STRL (GAUZE/BANDAGES/DRESSINGS) ×3 IMPLANT
GLOVE BIO SURGEON STRL SZ7.5 (GLOVE) ×2 IMPLANT
GLOVE BIO SURGEON STRL SZ8 (GLOVE) ×3 IMPLANT
GLOVE BIOGEL PI IND STRL 6.5 (GLOVE) IMPLANT
GLOVE BIOGEL PI IND STRL 8 (GLOVE) ×1 IMPLANT
GLOVE BIOGEL PI INDICATOR 6.5 (GLOVE)
GLOVE BIOGEL PI INDICATOR 8 (GLOVE) ×2
GLOVE SURG SS PI 6.5 STRL IVOR (GLOVE) IMPLANT
GOWN STRL REUS W/TWL LRG LVL3 (GOWN DISPOSABLE) ×3 IMPLANT
GOWN STRL REUS W/TWL XL LVL3 (GOWN DISPOSABLE) ×2 IMPLANT
HANDPIECE INTERPULSE COAX TIP (DISPOSABLE) ×3
IMMOBILIZER KNEE 20 (SOFTGOODS) ×3
IMMOBILIZER KNEE 20 THIGH 36 (SOFTGOODS) ×1 IMPLANT
MANIFOLD NEPTUNE II (INSTRUMENTS) ×3 IMPLANT
NS IRRIG 1000ML POUR BTL (IV SOLUTION) ×3 IMPLANT
PACK TOTAL KNEE CUSTOM (KITS) ×3 IMPLANT
PADDING CAST COTTON 6X4 STRL (CAST SUPPLIES) ×7 IMPLANT
POSITIONER SURGICAL ARM (MISCELLANEOUS) ×3 IMPLANT
SET HNDPC FAN SPRY TIP SCT (DISPOSABLE) ×1 IMPLANT
STRIP CLOSURE SKIN 1/2X4 (GAUZE/BANDAGES/DRESSINGS) ×4 IMPLANT
SUT MNCRL AB 4-0 PS2 18 (SUTURE) ×3 IMPLANT
SUT VIC AB 2-0 CT1 27 (SUTURE) ×9
SUT VIC AB 2-0 CT1 TAPERPNT 27 (SUTURE) ×3 IMPLANT
SUT VLOC 180 0 24IN GS25 (SUTURE) ×3 IMPLANT
SYR 50ML LL SCALE MARK (SYRINGE) ×1 IMPLANT
TRAY FOLEY CATH 14FRSI W/METER (CATHETERS) ×3 IMPLANT
TRAY FOLEY W/METER SILVER 16FR (SET/KITS/TRAYS/PACK) ×1 IMPLANT
WATER STERILE IRR 1500ML POUR (IV SOLUTION) ×5 IMPLANT
WRAP KNEE MAXI GEL POST OP (GAUZE/BANDAGES/DRESSINGS) ×3 IMPLANT
YANKAUER SUCT BULB TIP 10FT TU (MISCELLANEOUS) ×3 IMPLANT

## 2016-06-08 NOTE — Anesthesia Procedure Notes (Addendum)
Spinal  Patient location during procedure: OR Start time: 06/08/2016 1:43 PM End time: 06/08/2016 1:48 PM Staffing Anesthesiologist: [name] Resident/CRNA: Darlys Gales R Performed: resident/CRNA  Preanesthetic Checklist Completed: patient identified, site marked, surgical consent, pre-op evaluation, timeout performed, IV checked, risks and benefits discussed and monitors and equipment checked Spinal Block Patient position: sitting Prep: DuraPrep Patient monitoring: heart rate, continuous pulse ox and blood pressure Approach: midline Location: L3-4 Injection technique: single-shot Needle Needle type: Sprotte  Needle gauge: 22 G Needle length: 9 cm Needle insertion depth: 7 cm Additional Notes Lot [id number] 2017-06-25

## 2016-06-08 NOTE — Transfer of Care (Signed)
Immediate Anesthesia Transfer of Care Note  Patient: Mary Moss  Procedure(s) Performed: Procedure(s): RIGHT TOTAL KNEE ARTHROPLASTY (Right)  Patient Location: PACU  Anesthesia Type:Spinal  Level of Consciousness:  sedated, patient cooperative and responds to stimulation  Airway & Oxygen Therapy:Patient Spontanous Breathing and Patient connected to face mask oxgen  Post-op Assessment:  Report given to PACU RN and Post -op Vital signs reviewed and stable  Post vital signs:  Reviewed and stable  Last Vitals:  Vitals:   06/08/16 1022  BP: 114/68  Pulse: 95  Resp: 18  Temp: 123XX123 C    Complications: No apparent anesthesia complications

## 2016-06-08 NOTE — H&P (View-Only) (Signed)
Mary Moss DOB: 1935/12/05 Single / Language: Cleophus Molt / Race: White Female Date of Admission:  06/08/2016 CC:  Right Knee pain History of Present Illness The patient is a 80 year old female who comes in for a preoperative History and Physical. The patient is scheduled for a right total knee arthroplasty to be performed by Dr. Dione Plover. Aluisio, MD at [facility] on 06-08-2016. The patient is a 80 year old female who presented for follow up of their knee. The patient is being followed for their right osteoarthritis. Symptoms reported include: pain. The patient feels that they are doing 0 percent better. The patient has completed a series of Gelsyn injections. Unfortunately, her right knee is getting progressively worse over time. The injections do not help her at all. The right knee hurts at all times. We last saw her in the fall of last year and she was having considerable pain, but it has gotten a lot worse now. She is now failed cortisone and viscous supplements. At this point, she is at a stage where she feels like she needs to do something with the knee because it is limiting what she can and cannot do and having a very negative effect on her lifestyle. She is ready to proceed with surgery at this time. They have been treated conservatively in the past for the above stated problem and despite conservative measures, they continue to have progressive pain and severe functional limitations and dysfunction. They have failed non-operative management including home exercise, medications, and injections. It is felt that they would benefit from undergoing total joint replacement. Risks and benefits of the procedure have been discussed with the patient and they elect to proceed with surgery. There are no active contraindications to surgery such as ongoing infection or rapidly progressive neurological disease.  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 Perkins, III PA-C; 05/26/2016 4:21 PM) Polysporin *DERMATOLOGICALS*  Neosporin - redness and blisters  Family History  Drug / Alcohol Addiction  Sister. Cancer  Father, Mother.  Social History  Children  2 Current drinker  08/10/2013: Currently drinks wine only occasionally per week Current work status  working part time Exercise  Exercises rarely; does running / walking Living situation  live alone Marital status  widowed No history of drug/alcohol rehab  Not under pain contract  Number of flights of stairs before winded  4-5 Tobacco / smoke exposure  08/10/2013: no Tobacco use  Former smoker. 08/10/2013: smoke(d) 1/2 pack(s) per day Advance Directives  Living Will, Healthcare POA Post-Surgical Plans  Wants to look into [facility] following the Right TKA to be performed on 06/08/2016  Medication History Aleve (Oral as needed) Specific strength unknown - Active. Lisinopril (10MG  Tablet, Oral) Active. HydroCHLOROthiazide (25MG  Tablet, Oral) Active.  Past Surgical History Ankle Surgery  left Rotator Cuff Repair  right Dilation and Curettage of Uterus  Hysterectomy  complete (non-cancerous) Ankle Replacement  Date: 11/2014.    Review of Systems  General Not Present- Chills, Fatigue, Fever, Memory Loss, Night Sweats, Weight Gain and Weight Loss. Skin Not Present- Eczema, Hives, Itching, Lesions and Rash.  HEENT Not Present- Dentures, Double Vision, Headache, Hearing Loss, Tinnitus and Visual Loss. Respiratory Not  Present- Allergies, Chronic Cough, Coughing up blood, Shortness of breath at rest and Shortness of breath with exertion. Cardiovascular Not Present- Chest Pain, Difficulty Breathing Lying Down, Murmur, Palpitations, Racing/skipping heartbeats and Swelling. Gastrointestinal Not Present- Abdominal Pain, Bloody Stool, Constipation, Diarrhea, Difficulty Swallowing, Heartburn, Jaundice, Loss of appetitie, Nausea and Vomiting. Female Genitourinary Not Present- Blood in Urine, Discharge, Flank Pain, Incontinence, Painful Urination, Urgency, Urinary frequency, Urinary Retention, Urinating at Night and Weak urinary stream. Musculoskeletal Present- Back Pain, Joint Pain, Joint Swelling and Morning Stiffness. Not Present- Muscle Pain, Muscle Weakness and Spasms. Neurological Not Present- Blackout spells, Difficulty with balance, Dizziness, Paralysis, Tremor and Weakness. Psychiatric Not Present- Insomnia.  Vitals  Weight: 220 lb Height: 69in Weight was reported by patient. Height was reported by patient. Body Surface Area: 2.15 m Body Mass Index: 32.49 kg/m  Pulse: 84 (Regular)  BP: 128/76 (Sitting, Right Arm, Standard)  Physical Exam General Mental Status -Alert, cooperative and good historian. General Appearance-pleasant, Not in acute distress. Orientation-Oriented X3. Build & Nutrition-Well nourished and Well developed.  Head and Neck Head-normocephalic, atraumatic . Neck Global Assessment - supple, no bruit auscultated on the right, no bruit auscultated on the left.  Eye Vision-Wears corrective lenses. Pupil - Bilateral-Regular and Round. Motion - Bilateral-EOMI.  Chest and Lung Exam Auscultation Breath sounds - clear at anterior chest wall and clear at posterior chest wall. Adventitious sounds - No Adventitious sounds.  Cardiovascular Auscultation Rhythm - Regular rate and rhythm. Heart Sounds - S1 WNL and S2 WNL. Murmurs & Other Heart Sounds - Auscultation  of the heart reveals - No Murmurs.  Abdomen Inspection Contour - Generalized mild distention. Palpation/Percussion Tenderness - Abdomen is non-tender to palpation. Rigidity (guarding) - Abdomen is soft. Auscultation Auscultation of the abdomen reveals - Bowel sounds normal.  Female Genitourinary Note: Not done, not pertinent to present illness   Musculoskeletal Note: On exam, she is alert and oriented, in no apparent distress. Her right knee shows no effusion. Range 5 to 130. There is marked crepitus on range of motion with tenderness medial greater than lateral and no instability. Pulse, sensation, motor intact distally. Left knee exam is unremarkable.  Radiographs showed that she has got significant medial and patellofemoral arthritis of that right knee.  Assessment & Plan Primary osteoarthritis of right knee (M17.11)  Note:Surgical Plans: Right Total Knee Replacement  Disposition: Wants to look into [facility]  PCP: Dr. Evette Doffing, Josie Saunders  IV TXA  Anesthesia Issues: None  Signed electronically by Ok Edwards, III PA-C

## 2016-06-08 NOTE — Interval H&P Note (Signed)
History and Physical Interval Note:  06/08/2016 1:35 PM  Mary Moss  has presented today for surgery, with the diagnosis of RIGHT KNEE OA  The various methods of treatment have been discussed with the patient and family. After consideration of risks, benefits and other options for treatment, the patient has consented to  Procedure(s): RIGHT TOTAL KNEE ARTHROPLASTY (Right) as a surgical intervention .  The patient's history has been reviewed, patient examined, no change in status, stable for surgery.  I have reviewed the patient's chart and labs.  Questions were answered to the patient's satisfaction.     Gearlean Alf

## 2016-06-08 NOTE — Op Note (Signed)
OPERATIVE REPORT-TOTAL KNEE ARTHROPLASTY   Pre-operative diagnosis- Osteoarthritis  Right knee(s)  Post-operative diagnosis- Osteoarthritis Right knee(s)  Procedure-  Right  Total Knee Arthroplasty  Surgeon- Dione Plover. Landy Mace, MD  Assistant- Arlee Muslim, PA-C   Anesthesia-  Spinal  EBL-* No blood loss amount entered *   Drains Hemovac  Tourniquet time-  Total Tourniquet Time Documented: area (Right) - 37 minutes Total: area (Right) - 37 minutes     Complications- None  Condition-PACU - hemodynamically stable.   Brief Clinical Note  Mary Moss is a 80 y.o. year old female with end stage OA of her right knee with progressively worsening pain and dysfunction. She has constant pain, with activity and at rest and significant functional deficits with difficulties even with ADLs. She has had extensive non-op management including analgesics, injections of cortisone and viscosupplements, and home exercise program, but remains in significant pain with significant dysfunction.Radiographs show bone on bone arthritis medial and patellofemoral. She presents now for right Total Knee Arthroplasty.    Procedure in detail---   The patient is brought into the operating room and positioned supine on the operating table. After successful administration of  Spinal,   a tourniquet is placed high on the  Right thigh(s) and the lower extremity is prepped and draped in the usual sterile fashion. Time out is performed by the operating team and then the  Right lower extremity is wrapped in Esmarch, knee flexed and the tourniquet inflated to 300 mmHg.       A midline incision is made with a ten blade through the subcutaneous tissue to the level of the extensor mechanism. A fresh blade is used to make a medial parapatellar arthrotomy. Soft tissue over the proximal medial tibia is subperiosteally elevated to the joint line with a knife and into the semimembranosus bursa with a Cobb elevator. Soft  tissue over the proximal lateral tibia is elevated with attention being paid to avoiding the patellar tendon on the tibial tubercle. The patella is everted, knee flexed 90 degrees and the ACL and PCL are removed. Findings are bone on bone medial and patellofemoral with large global osteophytes.        The drill is used to create a starting hole in the distal femur and the canal is thoroughly irrigated with sterile saline to remove the fatty contents. The 5 degree Right  valgus alignment guide is placed into the femoral canal and the distal femoral cutting block is pinned to remove 10 mm off the distal femur. Resection is made with an oscillating saw.      The tibia is subluxed forward and the menisci are removed. The extramedullary alignment guide is placed referencing proximally at the medial aspect of the tibial tubercle and distally along the second metatarsal axis and tibial crest. The block is pinned to remove 67mm off the more deficient medial  side. Resection is made with an oscillating saw. Size 3is the most appropriate size for the tibia and the proximal tibia is prepared with the modular drill and keel punch for that size.      The femoral sizing guide is placed and size 4 is most appropriate. Rotation is marked off the epicondylar axis and confirmed by creating a rectangular flexion gap at 90 degrees. The size 4 cutting block is pinned in this rotation and the anterior, posterior and chamfer cuts are made with the oscillating saw. The intercondylar block is then placed and that cut is made.  Trial size 3 tibial component, trial size 4 narrow posterior stabilized femur and a 10  mm posterior stabilized rotating platform insert trial is placed. Full extension is achieved with excellent varus/valgus and anterior/posterior balance throughout full range of motion. The patella is everted and thickness measured to be 22  mm. Free hand resection is taken to 12 mm, a 38 template is placed, lug holes are  drilled, trial patella is placed, and it tracks normally. Osteophytes are removed off the posterior femur with the trial in place. All trials are removed and the cut bone surfaces prepared with pulsatile lavage. Cement is mixed and once ready for implantation, the size 3 tibial implant, size  4 narrow posterior stabilized femoral component, and the size 38 patella are cemented in place and the patella is held with the clamp. The trial insert is placed and the knee held in full extension. The Exparel (20 ml mixed with 30 ml saline) and .25% Bupivicaine, are injected into the extensor mechanism, posterior capsule, medial and lateral gutters and subcutaneous tissues.  All extruded cement is removed and once the cement is hard the permanent 10 mm posterior stabilized rotating platform insert is placed into the tibial tray.      The wound is copiously irrigated with saline solution and the extensor mechanism closed over a hemovac drain with #1 V-loc suture. The tourniquet is released for a total tourniquet time of 37  minutes. Flexion against gravity is 135 degrees and the patella tracks normally. Subcutaneous tissue is closed with 2.0 vicryl and subcuticular with running 4.0 Monocryl. The incision is cleaned and dried and steri-strips and a bulky sterile dressing are applied. The limb is placed into a knee immobilizer and the patient is awakened and transported to recovery in stable condition.      Please note that a surgical assistant was a medical necessity for this procedure in order to perform it in a safe and expeditious manner. Surgical assistant was necessary to retract the ligaments and vital neurovascular structures to prevent injury to them and also necessary for proper positioning of the limb to allow for anatomic placement of the prosthesis.   Dione Plover Hajra Port, MD    06/08/2016, 3:17 PM

## 2016-06-08 NOTE — Anesthesia Preprocedure Evaluation (Signed)
Anesthesia Evaluation  Patient identified by MRN, date of birth, ID band Patient awake    Reviewed: Allergy & Precautions, NPO status , Patient's Chart, lab work & pertinent test results  Airway Mallampati: II  TM Distance: >3 FB Neck ROM: Full    Dental no notable dental hx. (+) Caps   Pulmonary neg pulmonary ROS, former smoker,    Pulmonary exam normal breath sounds clear to auscultation       Cardiovascular hypertension, negative cardio ROS Normal cardiovascular exam Rhythm:Regular Rate:Normal     Neuro/Psych negative neurological ROS  negative psych ROS   GI/Hepatic negative GI ROS, Neg liver ROS,   Endo/Other  negative endocrine ROS  Renal/GU negative Renal ROS  negative genitourinary   Musculoskeletal negative musculoskeletal ROS (+)   Abdominal   Peds negative pediatric ROS (+)  Hematology negative hematology ROS (+)   Anesthesia Other Findings   Reproductive/Obstetrics negative OB ROS                             Anesthesia Physical Anesthesia Plan  ASA: II  Anesthesia Plan: Spinal   Post-op Pain Management:    Induction:   Airway Management Planned: Simple Face Mask  Additional Equipment:   Intra-op Plan:   Post-operative Plan:   Informed Consent: I have reviewed the patients History and Physical, chart, labs and discussed the procedure including the risks, benefits and alternatives for the proposed anesthesia with the patient or authorized representative who has indicated his/her understanding and acceptance.   Dental advisory given  Plan Discussed with: CRNA  Anesthesia Plan Comments:         Anesthesia Quick Evaluation

## 2016-06-08 NOTE — Anesthesia Postprocedure Evaluation (Signed)
Anesthesia Post Note  Patient: Mary Moss  Procedure(s) Performed: Procedure(s) (LRB): RIGHT TOTAL KNEE ARTHROPLASTY (Right)  Patient location during evaluation: PACU Anesthesia Type: Spinal Level of consciousness: awake and alert Pain management: pain level controlled Vital Signs Assessment: post-procedure vital signs reviewed and stable Respiratory status: spontaneous breathing and respiratory function stable Cardiovascular status: blood pressure returned to baseline and stable Postop Assessment: no headache, no backache and spinal receding Anesthetic complications: no    Last Vitals:  Vitals:   06/08/16 1526 06/08/16 1600  BP:    Pulse:    Resp:    Temp: 36.4 C (P) 36.4 C    Last Pain:  Vitals:   06/08/16 1022  TempSrc: Oral                 [name]

## 2016-06-09 LAB — BASIC METABOLIC PANEL
ANION GAP: 5 (ref 5–15)
BUN: 16 mg/dL (ref 6–20)
CHLORIDE: 103 mmol/L (ref 101–111)
CO2: 28 mmol/L (ref 22–32)
Calcium: 8.5 mg/dL — ABNORMAL LOW (ref 8.9–10.3)
Creatinine, Ser: 0.89 mg/dL (ref 0.44–1.00)
GFR calc Af Amer: >60 mL/min (ref >60)
Glucose, Bld: 163 mg/dL — ABNORMAL HIGH (ref 65–99)
POTASSIUM: 4.8 mmol/L (ref 3.5–5.1)
SODIUM: 136 mmol/L (ref 135–145)

## 2016-06-09 LAB — CBC
HCT: 38.6 % (ref 36.0–46.0)
HEMOGLOBIN: 12.5 g/dL (ref 12.0–15.0)
MCH: 30.3 pg (ref 26.0–34.0)
MCHC: 32.4 g/dL (ref 30.0–36.0)
MCV: 93.5 fL (ref 78.0–100.0)
PLATELETS: 259 10*3/uL (ref 150–400)
RBC: 4.13 MIL/uL (ref 3.87–5.11)
RDW: 12.8 % (ref 11.5–15.5)
WBC: 10.9 10*3/uL — AB (ref 4.0–10.5)

## 2016-06-09 MED ORDER — OXYCODONE HCL 5 MG PO TABS: 5.0000 mg | ORAL_TABLET | ORAL | 0 refills | Status: DC | PRN

## 2016-06-09 MED ORDER — METOCLOPRAMIDE HCL 5 MG PO TABS: 5.0000 mg | ORAL_TABLET | Freq: Three times a day (TID) | ORAL | 0 refills | Status: DC | PRN

## 2016-06-09 MED ORDER — ONDANSETRON HCL 4 MG PO TABS: 4.0000 mg | ORAL_TABLET | Freq: Four times a day (QID) | ORAL | 0 refills | Status: DC | PRN

## 2016-06-09 MED ORDER — DIPHENHYDRAMINE HCL 12.5 MG/5ML PO ELIX: 12.5000 mg | ORAL_SOLUTION | ORAL | 0 refills | Status: DC | PRN

## 2016-06-09 MED ORDER — TRAMADOL HCL 50 MG PO TABS: 50.0000 mg | ORAL_TABLET | Freq: Four times a day (QID) | ORAL | 1 refills | Status: DC | PRN

## 2016-06-09 MED ORDER — POLYETHYLENE GLYCOL 3350 17 G PO PACK
17.0000 g | PACK | Freq: Every day | ORAL | 0 refills | Status: DC | PRN
Start: 2016-06-09 — End: 2016-08-03

## 2016-06-09 MED ORDER — FLEET ENEMA 7-19 GM/118ML RE ENEM: 1.0000 | ENEMA | Freq: Once | RECTAL | 0 refills | Status: DC | PRN

## 2016-06-09 MED ORDER — DOCUSATE SODIUM 100 MG PO CAPS: 100.0000 mg | ORAL_CAPSULE | Freq: Two times a day (BID) | ORAL | 0 refills | Status: DC

## 2016-06-09 MED ORDER — RIVAROXABAN 10 MG PO TABS: 10.0000 mg | ORAL_TABLET | Freq: Every day | ORAL | 0 refills | Status: DC

## 2016-06-09 MED ORDER — BISACODYL 10 MG RE SUPP: 10.0000 mg | Freq: Every day | RECTAL | 0 refills | Status: DC | PRN

## 2016-06-09 MED ORDER — ACETAMINOPHEN 325 MG PO TABS: 650.0000 mg | ORAL_TABLET | Freq: Four times a day (QID) | ORAL | 0 refills | Status: DC | PRN

## 2016-06-09 MED ORDER — METHOCARBAMOL 500 MG PO TABS: 500.0000 mg | ORAL_TABLET | Freq: Four times a day (QID) | ORAL | 0 refills | Status: DC | PRN

## 2016-06-09 NOTE — Clinical Social Work Note (Signed)
Clinical Social Work Assessment  Patient Details  Name: Mary Moss MRN: [id number] Date of Birth: May 16, 1936  Date of referral:  06/09/16               Reason for consult:  Discharge Planning                Permission sought to share information with:  Chartered certified accountant granted to share information::  Yes, Verbal Permission Granted  Name::        Agency::     Relationship::     Contact Information:     Housing/Transportation Living arrangements for the past 2 months:  Single Family Home Source of Information:  Patient Patient Interpreter Needed:  None Criminal Activity/Legal Involvement Pertinent to Current Situation/Hospitalization:  No - Comment as needed Significant Relationships:  Adult Children Lives with:  Self Do you feel safe going back to the place where you live?  No (ST Rehab needed.) Need for family participation in patient care:  No (Coment)  Care giving concerns:  Pt's care cannot be managed at ome follow ing hospital d/c.   Social Worker assessment / plan:  Pt hospitalized from home on 06/08/16 for pre planned right total knee arthroplasty. Pt is part of the medicare bundle program. Pt reports that she has made prior arrangements to have ST Rehab at [facility] at d/c. SNF contacted and clinicals sent. Confirmation is pending. CSW will continue to follow to assist with d/c planning to SNF.  Employment status:  Retired Forensic scientist:  Medicare PT Recommendations:  [place] / Referral to community resources:  [place]  Patient/Family's Response to care:  Pt feels ST Rehab is needed.  Patient/Family's Understanding of and Emotional Response to Diagnosis, Current Treatment, and Prognosis:  Pt is aware of her medical status and is pleased surgery is over and all went well. " I'm sore but I'm going to work with therapy today. " Pt is motivated and looking forward to having rehab at  [facility] at d/c.  Emotional Assessment Appearance:  Appears stated age Attitude/Demeanor/Rapport:  Other (cooperative) Affect (typically observed):  Appropriate, Pleasant, Calm Orientation:  Oriented to Self, Oriented to Place, Oriented to  Time, Oriented to Situation Alcohol / Substance use:  Not Applicable Psych involvement (Current and /or in the community):  No (Comment)  Discharge Needs  Concerns to be addressed:  Discharge Planning Concerns Readmission within the last 30 days:  No Current discharge risk:  None Barriers to Discharge:  No Barriers Identified   Luretha Rued, [place] 06/09/2016, 9:41 AM

## 2016-06-09 NOTE — Discharge Summary (Signed)
Physician Discharge Summary   Patient ID: Mary Moss MRN: [id number] DOB/AGE: 1935-12-23 80 y.o.  Admit date: 06/08/2016 Discharge date: 06-10-2016  Primary Diagnosis:  Osteoarthritis  Right knee(s) Admission Diagnoses:  Past Medical History:  Diagnosis Date  . Ankle fracture fx 13 yrs ago, anklw swells off and on, has cramps in ankle also   left, to seee dr hewitt about 02-16-14  . Arthritis   . Basal cell carcinoma 2015   Nose  . GERD (gastroesophageal reflux disease)    OTC med  . Hypertension   . Pneumonia    Discharge Diagnoses:   Principal Problem:   OA (osteoarthritis) of knee  Estimated body mass index is 33.23 kg/m as calculated from the following:   Height as of this encounter: _0  (1.753 m).   Weight as of this encounter: 102.1 kg (225 lb).  Procedure:  Procedure(s) (LRB): RIGHT TOTAL KNEE ARTHROPLASTY (Right)   Consults: None  HPI: Mary Moss is a 80 y.o. year old female with end stage OA of her right knee with progressively worsening pain and dysfunction. She has constant pain, with activity and at rest and significant functional deficits with difficulties even with ADLs. She has had extensive non-op management including analgesics, injections of cortisone and viscosupplements, and home exercise program, but remains in significant pain with significant dysfunction.Radiographs show bone on bone arthritis medial and patellofemoral. She presents now for right Total Knee Arthroplasty.   Laboratory Data: Admission on 06/08/2016  Component Date Value Ref Range Status  . WBC 06/09/2016 10.9* 4.0 - 10.5 K/uL Final  . RBC 06/09/2016 4.13  3.87 - 5.11 MIL/uL Final  . Hemoglobin 06/09/2016 12.5  12.0 - 15.0 g/dL Final  . HCT 06/09/2016 38.6  36.0 - 46.0 % Final  . MCV 06/09/2016 93.5  78.0 - 100.0 fL Final  . MCH 06/09/2016 30.3  26.0 - 34.0 pg Final  . MCHC 06/09/2016 32.4  30.0 - 36.0 g/dL Final  . RDW 06/09/2016 12.8  11.5 - 15.5 % Final  .  Platelets 06/09/2016 259  150 - 400 K/uL Final  . Sodium 06/09/2016 136  135 - 145 mmol/L Final  . Potassium 06/09/2016 4.8  3.5 - 5.1 mmol/L Final  . Chloride 06/09/2016 103  101 - 111 mmol/L Final  . CO2 06/09/2016 28  22 - 32 mmol/L Final  . Glucose, Bld 06/09/2016 163* 65 - 99 mg/dL Final  . BUN 06/09/2016 16  6 - 20 mg/dL Final  . Creatinine, Ser 06/09/2016 0.89  0.44 - 1.00 mg/dL Final  . Calcium 06/09/2016 8.5* 8.9 - 10.3 mg/dL Final  . GFR calc non Af Amer 06/09/2016 >60  >60 mL/min Final  . GFR calc Af Amer 06/09/2016 >60  >60 mL/min Final   Comment: (NOTE) The eGFR has been calculated using the CKD EPI equation. This calculation has not been validated in all clinical situations. eGFR's persistently <60 mL/min signify possible Chronic Kidney Disease.   Mary Moss gap 06/09/2016 5  5 - 15 Final  Hospital Outpatient Visit on 06/01/2016  Component Date Value Ref Range Status  . aPTT 06/01/2016 28  24 - 36 seconds Final  . WBC 06/01/2016 8.1  4.0 - 10.5 K/uL Final  . RBC 06/01/2016 4.60  3.87 - 5.11 MIL/uL Final  . Hemoglobin 06/01/2016 14.1  12.0 - 15.0 g/dL Final  . HCT 06/01/2016 43.9  36.0 - 46.0 % Final  . MCV 06/01/2016 95.4  78.0 - 100.0 fL Final  . MCH  06/01/2016 30.7  26.0 - 34.0 pg Final  . MCHC 06/01/2016 32.1  30.0 - 36.0 g/dL Final  . RDW 06/01/2016 13.3  11.5 - 15.5 % Final  . Platelets 06/01/2016 285  150 - 400 K/uL Final  . Sodium 06/01/2016 141  135 - 145 mmol/L Final  . Potassium 06/01/2016 4.2  3.5 - 5.1 mmol/L Final  . Chloride 06/01/2016 104  101 - 111 mmol/L Final  . CO2 06/01/2016 31  22 - 32 mmol/L Final  . Glucose, Bld 06/01/2016 93  65 - 99 mg/dL Final  . BUN 06/01/2016 22* 6 - 20 mg/dL Final  . Creatinine, Ser 06/01/2016 1.01* 0.44 - 1.00 mg/dL Final  . Calcium 06/01/2016 9.5  8.9 - 10.3 mg/dL Final  . Total Protein 06/01/2016 7.7  6.5 - 8.1 g/dL Final  . Albumin 06/01/2016 3.7  3.5 - 5.0 g/dL Final  . AST 06/01/2016 18  15 - 41 U/L Final  . ALT  06/01/2016 16  14 - 54 U/L Final  . Alkaline Phosphatase 06/01/2016 86  38 - 126 U/L Final  . Total Bilirubin 06/01/2016 1.0  0.3 - 1.2 mg/dL Final  . GFR calc non Af Amer 06/01/2016 52* >60 mL/min Final  . GFR calc Af Amer 06/01/2016 >60  >60 mL/min Final   Comment: (NOTE) The eGFR has been calculated using the CKD EPI equation. This calculation has not been validated in all clinical situations. eGFR's persistently <60 mL/min signify possible Chronic Kidney Disease.   . Anion gap 06/01/2016 6  5 - 15 Final  . Prothrombin Time 06/01/2016 13.5  11.4 - 15.2 seconds Final  . INR 06/01/2016 1.02   Final  . ABO/RH(D) 06/08/2016 A POS   Final  . Antibody Screen 06/08/2016 NEG   Final  . Sample Expiration 06/08/2016 06/11/2016   Final  . Extend sample reason 06/08/2016 NO TRANSFUSIONS OR PREGNANCY IN THE PAST 3 MONTHS   Final  . Color, Urine 06/01/2016 YELLOW  YELLOW Final  . APPearance 06/01/2016 CLEAR  CLEAR Final  . Specific Gravity, Urine 06/01/2016 1.026  1.005 - 1.030 Final  . pH 06/01/2016 7.0  5.0 - 8.0 Final  . Glucose, UA 06/01/2016 NEGATIVE  NEGATIVE mg/dL Final  . Hgb urine dipstick 06/01/2016 NEGATIVE  NEGATIVE Final  . Bilirubin Urine 06/01/2016 NEGATIVE  NEGATIVE Final  . Ketones, ur 06/01/2016 NEGATIVE  NEGATIVE mg/dL Final  . Protein, ur 06/01/2016 NEGATIVE  NEGATIVE mg/dL Final  . Nitrite 06/01/2016 NEGATIVE  NEGATIVE Final  . Leukocytes, UA 06/01/2016 SMALL* NEGATIVE Final  . MRSA, PCR 06/01/2016 POSITIVE* NEGATIVE Final   Comment: RESULT CALLED TO, READ BACK BY AND VERIFIED WITH: K.STANLEY RN [place] [id number] [email or web address]   . Staphylococcus aureus 06/01/2016 POSITIVE* NEGATIVE Final   Comment:        The Xpert SA Assay (FDA approved for NASAL specimens in patients over 38 years of age), is one component of a comprehensive surveillance program.  Test performance has been validated by [facility] for patients greater than or equal to 58 year old. It is not intended to  diagnose infection nor to guide or monitor treatment.   . ABO/RH(D) 06/01/2016 A POS   Final  . Squamous Epithelial / LPF 06/01/2016 0-5* NONE SEEN Final  . WBC, UA 06/01/2016 0-5  0 - 5 WBC/hpf Final  . RBC / HPF 06/01/2016 0-5  0 - 5 RBC/hpf Final  . Bacteria, UA 06/01/2016 FEW* NONE SEEN Final     X-Rays:No results found.  EKG: Orders placed or performed during the hospital encounter of 06/01/16  . EKG 12 lead  . EKG 12 lead     Hospital Course: Mary Moss is a 80 y.o. who was admitted to [facility]. They were brought to the operating room on 06/08/2016 and underwent Procedure(s): RIGHT TOTAL KNEE ARTHROPLASTY.  Patient tolerated the procedure well and was later transferred to the recovery room and then to the orthopaedic floor for postoperative care.  They were given PO and IV analgesics for pain control following their surgery.  They were given 24 hours of postoperative antibiotics of  Anti-infectives    Start     Dose/Rate Route Frequency Ordered Stop   06/08/16 2000  ceFAZolin (ANCEF) IVPB 2g/100 mL premix     2 g 200 mL/hr over 30 Minutes Intravenous Every 6 hours 06/08/16 1723 06/09/16 0146   06/08/16 1030  vancomycin (VANCOCIN) IVPB 1000 mg/200 mL premix     1,000 mg 200 mL/hr over 60 Minutes Intravenous  Once 06/08/16 1018 06/08/16 1410   06/08/16 1018  ceFAZolin (ANCEF) IVPB 2g/100 mL premix     2 g 200 mL/hr over 30 Minutes Intravenous On call to O.R. 06/08/16 1018 06/08/16 1357     and started on DVT prophylaxis in the form of Xarelto.   PT and OT were ordered for total joint protocol.  Discharge planning consulted to help with postop disposition and equipment needs.  Patient had a decent night on the evening of surgery.  They started to get up OOB with therapy on day one. Hemovac drain was pulled without difficulty.  Continued to work with therapy into day two.  Dressing was changed on day two and the incision was healing well.  Patient was seen in  rounds on POD 2 and was ready to go to the SNF.  Discharge to SNF Diet - Cardiac diet Follow up - in 2 weeks Activity - WBAT Disposition - Skilled nursing facility Condition Upon Discharge - Good D/C Meds - See DC Summary DVT Prophylaxis - Xarelto  Discharge Instructions    Call MD / Call 911    Complete by:  As directed    If you experience chest pain or shortness of breath, CALL 911 and be transported to the hospital emergency room.  If you develope a fever above 101 F, pus (white drainage) or increased drainage or redness at the wound, or calf pain, call your surgeon's office.   Change dressing    Complete by:  As directed    Change dressing daily with sterile 4 x 4 inch gauze dressing and apply TED hose. Do not submerge the incision under water.   Constipation Prevention    Complete by:  As directed    Drink plenty of fluids.  Prune juice may be helpful.  You may use a stool softener, such as Colace (over the counter) 100 mg twice a day.  Use MiraLax (over the counter) for constipation as needed.   Diet - low sodium heart healthy    Complete by:  As directed    Diet Carb Modified    Complete by:  As directed    Discharge instructions    Complete by:  As directed    Pick up stool softner and laxative for home use following surgery while on pain medications. Do not submerge incision under water. Please use good hand washing techniques while changing dressing each day. May shower starting three days after surgery. Please use a clean  towel to pat the incision dry following showers. Continue to use ice for pain and swelling after surgery. Do not use any lotions or creams on the incision until instructed by your surgeon.   Postoperative Constipation Protocol  Constipation - defined medically as fewer than three stools per week and severe constipation as less than one stool per week.  One of the most common issues patients have following surgery is constipation.  Even if you have  a regular bowel pattern at home, your normal regimen is likely to be disrupted due to multiple reasons following surgery.  Combination of anesthesia, postoperative narcotics, change in appetite and fluid intake all can affect your bowels.  In order to avoid complications following surgery, here are some recommendations in order to help you during your recovery period.  Colace (docusate) - Pick up an over-the-counter form of Colace or another stool softener and take twice a day as long as you are requiring postoperative pain medications.  Take with a full glass of water daily.  If you experience loose stools or diarrhea, hold the colace until you stool forms back up.  If your symptoms do not get better within 1 week or if they get worse, check with your doctor.  Dulcolax (bisacodyl) - Pick up over-the-counter and take as directed by the product packaging as needed to assist with the movement of your bowels.  Take with a full glass of water.  Use this product as needed if not relieved by Colace only.   MiraLax (polyethylene glycol) - Pick up over-the-counter to have on hand.  MiraLax is a solution that will increase the amount of water in your bowels to assist with bowel movements.  Take as directed and can mix with a glass of water, juice, soda, coffee, or tea.  Take if you go more than two days without a movement. Do not use MiraLax more than once per day. Call your doctor if you are still constipated or irregular after using this medication for 7 days in a row.  If you continue to have problems with postoperative constipation, please contact the office for further assistance and recommendations.  If you experience "the worst abdominal pain ever" or develop nausea or vomiting, please contact the office immediatly for further recommendations for treatment.   Take Xarelto for two and a half more weeks, then discontinue Xarelto. Once the patient has completed the blood thinner regimen, then take a Baby 81  mg Aspirin daily for three more weeks.   Do not put a pillow under the knee. Place it under the heel.    Complete by:  As directed    Do not sit on low chairs, stoools or toilet seats, as it may be difficult to get up from low surfaces    Complete by:  As directed    Driving restrictions    Complete by:  As directed    No driving until released by the physician.   Increase activity slowly as tolerated    Complete by:  As directed    Lifting restrictions    Complete by:  As directed    No lifting until released by the physician.   Patient may shower    Complete by:  As directed    You may shower without a dressing once there is no drainage.  Do not wash over the wound.  If drainage remains, do not shower until drainage stops.   TED hose    Complete by:  As directed  Use stockings (TED hose) for 3 weeks on both leg(s).  You may remove them at night for sleeping.   Weight bearing as tolerated    Complete by:  As directed    Laterality:  right   Extremity:  Lower       Medication List    STOP taking these medications   naproxen sodium 220 MG tablet Commonly known as:  ANAPROX     TAKE these medications   acetaminophen 325 MG tablet Commonly known as:  TYLENOL Take 2 tablets (650 mg total) by mouth every 6 (six) hours as needed for mild pain (or Fever >/= 101).   bisacodyl 10 MG suppository Commonly known as:  DULCOLAX Place 1 suppository (10 mg total) rectally daily as needed for moderate constipation.   diphenhydrAMINE 12.5 MG/5ML elixir Commonly known as:  BENADRYL Take 5-10 mLs (12.5-25 mg total) by mouth every 4 (four) hours as needed for itching.   diphenhydramine-acetaminophen 25-500 MG Tabs tablet Commonly known as:  TYLENOL PM Take 1-2 tablets by mouth at bedtime as needed (for pain/rest).   docusate sodium 100 MG capsule Commonly known as:  COLACE Take 1 capsule (100 mg total) by mouth 2 (two) times daily.   hydrochlorothiazide 25 MG tablet Commonly known  as:  HYDRODIURIL Take 1 tablet (25 mg total) by mouth daily. What changed:  when to take this   lisinopril 10 MG tablet Commonly known as:  PRINIVIL,ZESTRIL TAKE ONE TABLET BY MOUTH ONCE DAILY What changed:  See the new instructions.   methocarbamol 500 MG tablet Commonly known as:  ROBAXIN Take 1 tablet (500 mg total) by mouth every 6 (six) hours as needed for muscle spasms.   metoCLOPramide 5 MG tablet Commonly known as:  REGLAN Take 1 tablet (5 mg total) by mouth every 8 (eight) hours as needed for nausea (if ondansetron (ZOFRAN) ineffective.).   ondansetron 4 MG tablet Commonly known as:  ZOFRAN Take 1 tablet (4 mg total) by mouth every 6 (six) hours as needed for nausea.   oxyCODONE 5 MG immediate release tablet Commonly known as:  Oxy IR/ROXICODONE Take 1-2 tablets (5-10 mg total) by mouth every 3 (three) hours as needed for moderate pain or severe pain.   polyethylene glycol packet Commonly known as:  MIRALAX / GLYCOLAX Take 17 g by mouth daily as needed for mild constipation.   rivaroxaban 10 MG Tabs tablet Commonly known as:  XARELTO Take 1 tablet (10 mg total) by mouth daily with breakfast. Take Xarelto for two and a half more weeks, then discontinue Xarelto. Once the patient has completed the blood thinner regimen, then take a Baby 81 mg Aspirin daily for three more weeks. Start taking on:  06/10/2016   sodium phosphate 7-19 GM/118ML Enem Place 133 mLs (1 enema total) rectally once as needed for severe constipation.   traMADol 50 MG tablet Commonly known as:  ULTRAM Take 1-2 tablets (50-100 mg total) by mouth every 6 (six) hours as needed (mild pain).      Follow-up Information    Gearlean Alf, MD. Schedule an appointment as soon as possible for a visit on 06/23/2016.   Specialty:  Orthopedic Surgery Contact information: [address] [place] [address] [phone]           Signed: Arlee Muslim, PA-C Orthopaedic  Surgery 06/09/2016, 10:14 PM

## 2016-06-09 NOTE — Discharge Instructions (Addendum)
° °Dr. Frank Aluisio °Total Joint Specialist °[facility] °[address] °[place], [place] [address] °[phone] ° °TOTAL KNEE REPLACEMENT POSTOPERATIVE DIRECTIONS ° °Knee Rehabilitation, Guidelines Following Surgery  °Results after knee surgery are often greatly improved when you follow the exercise, range of motion and muscle strengthening exercises prescribed by your doctor. Safety measures are also important to protect the knee from further injury. Any time any of these exercises cause you to have increased pain or swelling in your knee joint, decrease the amount until you are comfortable again and slowly increase them. If you have problems or questions, call your caregiver or physical therapist for advice.  ° °HOME CARE INSTRUCTIONS  °Remove items at home which could result in a fall. This includes throw rugs or furniture in walking pathways.  °· ICE to the affected knee every three hours for 30 minutes at a time and then as needed for pain and swelling.  Continue to use ice on the knee for pain and swelling from surgery. You may notice swelling that will progress down to the foot and ankle.  This is normal after surgery.  Elevate the leg when you are not up walking on it.   °· Continue to use the breathing machine which will help keep your temperature down.  It is common for your temperature to cycle up and down following surgery, especially at night when you are not up moving around and exerting yourself.  The breathing machine keeps your lungs expanded and your temperature down. °· Do not place pillow under knee, focus on keeping the knee straight while resting ° °DIET °You may resume your previous home diet once your are discharged from the hospital. ° °DRESSING / WOUND CARE / SHOWERING °You may shower 3 days after surgery, but keep the wounds dry during showering.  You may use an occlusive plastic wrap (Press'n Seal for example), NO SOAKING/SUBMERGING IN THE BATHTUB.  If the  bandage gets wet, change with a clean dry gauze.  If the incision gets wet, pat the wound dry with a clean towel. °You may start showering once you are discharged home but do not submerge the incision under water. Just pat the incision dry and apply a dry gauze dressing on daily. °Change the surgical dressing daily and reapply a dry dressing each time. ° °ACTIVITY °Walk with your walker as instructed. °Use walker as long as suggested by your caregivers. °Avoid periods of inactivity such as sitting longer than an hour when not asleep. This helps prevent blood clots.  °You may resume a sexual relationship in one month or when given the OK by your doctor.  °You may return to work once you are cleared by your doctor.  °Do not drive a car for 6 weeks or until released by you surgeon.  °Do not drive while taking narcotics. ° °WEIGHT BEARING °Weight bearing as tolerated with assist device (walker, cane, etc) as directed, use it as long as suggested by your surgeon or therapist, typically at least 4-6 weeks. ° °POSTOPERATIVE CONSTIPATION PROTOCOL °Constipation - defined medically as fewer than three stools per week and severe constipation as less than one stool per week. ° °One of the most common issues patients have following surgery is constipation.  Even if you have a regular bowel pattern at home, your normal regimen is likely to be disrupted due to multiple reasons following surgery.  Combination of anesthesia, postoperative narcotics, change in appetite and fluid intake all can affect your bowels.    In order to avoid complications following surgery, here are some recommendations in order to help you during your recovery period. ° °Colace (docusate) - Pick up an over-the-counter form of Colace or another stool softener and take twice a day as long as you are requiring postoperative pain medications.  Take with a full glass of water daily.  If you experience loose stools or diarrhea, hold the colace until you stool forms  back up.  If your symptoms do not get better within 1 week or if they get worse, check with your doctor. ° °Dulcolax (bisacodyl) - Pick up over-the-counter and take as directed by the product packaging as needed to assist with the movement of your bowels.  Take with a full glass of water.  Use this product as needed if not relieved by Colace only.  ° °MiraLax (polyethylene glycol) - Pick up over-the-counter to have on hand.  MiraLax is a solution that will increase the amount of water in your bowels to assist with bowel movements.  Take as directed and can mix with a glass of water, juice, soda, coffee, or tea.  Take if you go more than two days without a movement. °Do not use MiraLax more than once per day. Call your doctor if you are still constipated or irregular after using this medication for 7 days in a row. ° °If you continue to have problems with postoperative constipation, please contact the office for further assistance and recommendations.  If you experience "the worst abdominal pain ever" or develop nausea or vomiting, please contact the office immediatly for further recommendations for treatment. ° °ITCHING ° If you experience itching with your medications, try taking only a single pain pill, or even half a pain pill at a time.  You can also use Benadryl over the counter for itching or also to help with sleep.  ° °TED HOSE STOCKINGS °Wear the elastic stockings on both legs for three weeks following surgery during the day but you may remove then at night for sleeping. ° °MEDICATIONS °See your medication summary on the “After Visit Summary” that the nursing staff will review with you prior to discharge.  You may have some home medications which will be placed on hold until you complete the course of blood thinner medication.  It is important for you to complete the blood thinner medication as prescribed by your surgeon.  Continue your approved medications as instructed at time of  discharge. ° °PRECAUTIONS °If you experience chest pain or shortness of breath - call 911 immediately for transfer to the hospital emergency department.  °If you develop a fever greater that 101 F, purulent drainage from wound, increased redness or drainage from wound, foul odor from the wound/dressing, or calf pain - CONTACT YOUR SURGEON.   °                                                °FOLLOW-UP APPOINTMENTS °Make sure you keep all of your appointments after your operation with your surgeon and caregivers. You should call the office at the above phone number and make an appointment for approximately two weeks after the date of your surgery or on the date instructed by your surgeon outlined in the "After Visit Summary". ° ° °RANGE OF MOTION AND STRENGTHENING EXERCISES  °Rehabilitation of the knee is important following a knee injury or   an operation. After just a few days of immobilization, the muscles of the thigh which control the knee become weakened and shrink (atrophy). Knee exercises are designed to build up the tone and strength of the thigh muscles and to improve knee motion. Often times heat used for twenty to thirty minutes before working out will loosen up your tissues and help with improving the range of motion but do not use heat for the first two weeks following surgery. These exercises can be done on a training (exercise) mat, on the floor, on a table or on a bed. Use what ever works the best and is most comfortable for you Knee exercises include:  °Leg Lifts - While your knee is still immobilized in a splint or cast, you can do straight leg raises. Lift the leg to 60 degrees, hold for 3 sec, and slowly lower the leg. Repeat 10-20 times 2-3 times daily. Perform this exercise against resistance later as your knee gets better.  °Quad and Hamstring Sets - Tighten up the muscle on the front of the thigh (Quad) and hold for 5-10 sec. Repeat this 10-20 times hourly. Hamstring sets are done by pushing the  foot backward against an object and holding for 5-10 sec. Repeat as with quad sets.  °· Leg Slides: Lying on your back, slowly slide your foot toward your buttocks, bending your knee up off the floor (only go as far as is comfortable). Then slowly slide your foot back down until your leg is flat on the floor again. °· Angel Wings: Lying on your back spread your legs to the side as far apart as you can without causing discomfort.  °A rehabilitation program following serious knee injuries can speed recovery and prevent re-injury in the future due to weakened muscles. Contact your doctor or a physical therapist for more information on knee rehabilitation.  ° °IF YOU ARE TRANSFERRED TO A SKILLED REHAB FACILITY °If the patient is transferred to a skilled rehab facility following release from the hospital, a list of the current medications will be sent to the facility for the patient to continue.  When discharged from the skilled rehab facility, please have the facility set up the patient's Home Health Physical Therapy prior to being released. Also, the skilled facility will be responsible for providing the patient with their medications at time of release from the facility to include their pain medication, the muscle relaxants, and their blood thinner medication. If the patient is still at the rehab facility at time of the two week follow up appointment, the skilled rehab facility will also need to assist the patient in arranging follow up appointment in our office and any transportation needs. ° °MAKE SURE YOU:  °Understand these instructions.  °Get help right away if you are not doing well or get worse.  ° ° °Pick up stool softner and laxative for home use following surgery while on pain medications. °Do not submerge incision under water. °Please use good hand washing techniques while changing dressing each day. °May shower starting three days after surgery. °Please use a clean towel to pat the incision dry following  showers. °Continue to use ice for pain and swelling after surgery. °Do not use any lotions or creams on the incision until instructed by your surgeon. ° °Take Xarelto for two and a half more weeks, then discontinue Xarelto. °Once the patient has completed the blood thinner regimen, then take a Baby 81 mg Aspirin daily for three more weeks. ° ° °Information   on my medicine - XARELTO® (Rivaroxaban) ° °This medication education was reviewed with me or my healthcare representative as part of my discharge preparation.  The pharmacist that spoke with me during my hospital stay was:  Glogovac,nikola, RPH ° °Why was Xarelto® prescribed for you? °Xarelto® was prescribed for you to reduce the risk of blood clots forming after orthopedic surgery. The medical term for these abnormal blood clots is venous thromboembolism (VTE). ° °What do you need to know about xarelto® ? °Take your Xarelto® ONCE DAILY at the same time every day. °You may take it either with or without food. ° °If you have difficulty swallowing the tablet whole, you may crush it and mix in applesauce just prior to taking your dose. ° °Take Xarelto® exactly as prescribed by your doctor and DO NOT stop taking Xarelto® without talking to the doctor who prescribed the medication.  Stopping without other VTE prevention medication to take the place of Xarelto® may increase your risk of developing a clot. ° °After discharge, you should have regular check-up appointments with your healthcare provider that is prescribing your Xarelto®.   ° °What do you do if you miss a dose? °If you miss a dose, take it as soon as you remember on the same day then continue your regularly scheduled once daily regimen the next day. Do not take two doses of Xarelto® on the same day.  ° °Important Safety Information °A possible side effect of Xarelto® is bleeding. You should call your healthcare provider right away if you experience any of the following: °? Bleeding from an injury or your  nose that does not stop. °? Unusual colored urine (red or dark brown) or unusual colored stools (red or black). °? Unusual bruising for unknown reasons. °? A serious fall or if you hit your head (even if there is no bleeding). ° °Some medicines may interact with Xarelto® and might increase your risk of bleeding while on Xarelto®. To help avoid this, consult your healthcare provider or pharmacist prior to using any new prescription or non-prescription medications, including herbals, vitamins, non-steroidal anti-inflammatory drugs (NSAIDs) and supplements. ° °This website has more information on Xarelto®: www.xarelto.com. ° ° °

## 2016-06-09 NOTE — Evaluation (Signed)
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  Pt would benefit from follow up rehab at SNF level to maximize IND and safety prior to return home with ltd assist.    Follow Up Recommendations SNF    Equipment Recommendations  None recommended by PT    Recommendations for Other Services OT consult     Precautions / Restrictions Precautions Precautions: Fall;Knee Required Braces or Orthoses: Knee Immobilizer - Right Knee Immobilizer - Right: Discontinue once straight leg raise with < 10 degree lag Restrictions Weight Bearing Restrictions: No Other Position/Activity Restrictions: WBAT      Mobility  Bed Mobility Overal bed mobility: Needs Assistance Bed Mobility: Supine to Sit     Supine to sit: Min assist     General bed mobility comments: cues for sequence and use of L LE to self assist  Transfers Overall transfer level: Needs assistance Equipment used: Rolling walker (2 wheeled) Transfers: Sit to/from Stand Sit to Stand: Min assist;Mod assist Stand pivot transfers: Min assist;Mod assist       General transfer comment: cues for LE management and use of UEs to self assist.  Stand pvt with RW recliner<>BSC  Ambulation/Gait Ambulation/Gait assistance: Min assist Ambulation Distance (Feet): 16 Feet Assistive device: Rolling walker (2 wheeled) Gait Pattern/deviations: Step-to pattern;Decreased step length - right;Decreased step length - left;Shuffle;Trunk flexed;Antalgic Gait velocity: decr Gait velocity interpretation: Below normal speed for age/gender General Gait Details: Increased time and cues for sequence, posture and position from [employer]            Wheelchair Mobility     Modified Rankin (Stroke Patients Only)       Balance Overall balance assessment: Needs assistance Sitting-balance support: No upper extremity supported;Feet supported Sitting balance-Leahy Scale: Good     Standing balance support: Bilateral upper extremity supported Standing balance-Leahy Scale: Fair                               Pertinent Vitals/Pain Pain Assessment: 0-10 Pain Score: 6  Pain Location: R knee Pain Descriptors / Indicators: Aching;Sore Pain Intervention(s): Limited activity within patient's tolerance;Monitored during session;Premedicated before session;Ice applied    Home Living Family/patient expects to be discharged to:: Skilled nursing facility Living Arrangements: Alone                    Prior Function Level of Independence: Independent               Hand Dominance        Extremity/Trunk Assessment   Upper Extremity Assessment: Overall WFL for tasks assessed           Lower Extremity Assessment: RLE deficits/detail RLE Deficits / Details: 2+/5 quads with AAROM at knee -8 - 70       Communication   Communication: No difficulties  Cognition Arousal/Alertness: Awake/alert Behavior During Therapy: WFL for tasks assessed/performed Overall Cognitive Status: Within Functional Limits for tasks assessed                      General Comments      Exercises Total Joint Exercises Ankle Circles/Pumps: AROM;Both;15 reps;Supine Quad Sets:  AROM;Both;10 reps;Supine Heel Slides: AAROM;Right;15 reps;Supine Straight Leg Raises: AAROM;Right;10 reps;Supine   Assessment/Plan    PT Assessment Patient needs continued PT services  PT Problem List Decreased strength;Decreased range of motion;Decreased activity tolerance;Decreased mobility;Decreased knowledge of use of DME;Obesity;Pain          PT Treatment Interventions DME instruction;Gait training;Stair training;Functional mobility training;Therapeutic  activities;Therapeutic exercise;Patient/family education    PT Goals (Current goals can be found in the Care Plan section)  Acute Rehab PT Goals Patient Stated Goal: Regain IND to return home alone ASAP PT Goal Formulation: With patient Time For Goal Achievement: 06/13/16 Potential to Achieve Goals: Good    Frequency 7X/week   Barriers to discharge        Co-evaluation               End of Session Equipment Utilized During Treatment: Gait belt;Right knee immobilizer Activity Tolerance: Patient tolerated treatment well Patient left: in chair;with call bell/phone within reach;with family/visitor present;with chair alarm set Nurse Communication: Mobility status         Time: 1002-1040 PT Time Calculation (min) (ACUTE ONLY): 38 min   Charges:   PT Evaluation $PT Eval Low Complexity: 1 Procedure PT Treatments $Gait Training: 8-22 mins $Therapeutic Exercise: 8-22 mins   PT G Codes:        Ariannah Arenson 2016-07-08, 11:49 AM

## 2016-06-09 NOTE — Clinical Social Work Placement (Signed)
   CLINICAL SOCIAL WORK PLACEMENT  NOTE  Date:  06/09/2016  Patient Details  Name: Mary Moss MRN: [id number] Date of Birth: 01/04/1936  Clinical Social Work is seeking post-discharge placement for this patient at the [place] level of care (*CSW will initial, date and re-position this form in  chart as items are completed):  No   Patient/family provided with [facility] Work Department's list of facilities offering this level of care within the geographic area requested by the patient (or if unable, by the patient's family).  Yes   Patient/family informed of their freedom to choose among providers that offer the needed level of care, that participate in Medicare, Medicaid or managed care program needed by the patient, have an available bed and are willing to accept the patient.  Yes   Patient/family informed of [facility]'s ownership interest in [facility] and [facility], as well as of the fact that they are under no obligation to receive care at these facilities.  PASRR submitted to EDS on 06/09/16     PASRR number received on 06/09/16     Existing PASRR number confirmed on       FL2 transmitted to all facilities in geographic area requested by pt/family on 06/09/16     FL2 transmitted to all facilities within larger geographic area on       Patient informed that his/her managed care company has contracts with or will negotiate with certain facilities, including the following:            Patient/family informed of bed offers received.  Patient chooses bed at       Physician recommends and patient chooses bed at      Patient to be transferred to   on  .  Patient to be transferred to facility by       Patient family notified on   of transfer.  Name of family member notified:        PHYSICIAN       Additional Comment:    _______________________________________________ Luretha Rued, [place] 06/09/2016, 10:26 AM

## 2016-06-09 NOTE — Progress Notes (Signed)
OT Cancellation Note  Patient Details Name: TRENITA RIVERE MRN: [id number] DOB: 05-21-1936   Cancelled Treatment:    Reason Eval/Treat Not Completed: Other (comment).  Plan is for snf; will defer OT to that venue.  Thanh Pomerleau 06/09/2016, 8:30 AM  Lesle Chris, OTR/L [phone] 06/09/2016

## 2016-06-09 NOTE — Care Management Note (Signed)
Case Management Note  Patient Details  Name: Mary Moss MRN: [id number] Date of Birth: [birth year]  Subjective/Objective:                  Right  Total Knee Arthroplasty Action/Plan: Discharge planning Expected Discharge Date:  06/10/16               Expected Discharge Plan:  [place]  In-House Referral:     Discharge planning Services  CM Consult  Post Acute Care Choice:    Choice offered to:     DME Arranged:    DME Agency:     HH Arranged:    [place] Agency:     Status of Service:  Completed, signed off  If discussed at [employer] of Stay Meetings, dates discussed:    Additional Comments: CM notes pt to go to SNF for rehab; CSW aware and arranging. NO other CM needs were communicated. Dellie Catholic, RN 06/09/2016, 12:05 PM

## 2016-06-09 NOTE — NC FL2 (Signed)
[place] LEVEL OF CARE SCREENING TOOL     IDENTIFICATION  Patient Name: Mary Moss Birthdate: 1936/06/20 Sex: female Admission Date (Current Location): 06/08/2016  [facility] and [place] Number:  Engineer, manufacturing systems and Address:  [facility],  [place] [address], [place]      Provider Number: [id number]  Attending Physician Name and Address:  Gaynelle Arabian, MD  Relative Name and Phone Number:       Current Level of Care: Hospital Recommended Level of Care: [place] Prior Approval Number:    Date Approved/Denied:   PASRR Number: [id number]  Discharge Plan: SNF    Current Diagnoses: Patient Active Problem List   Diagnosis Date Noted  . OA (osteoarthritis) of knee 06/08/2016  . Arthritis of knee, right 01/31/2016  . Hyperlipidemia 09/02/2015  . Encounter for preventive health examination 08/05/2015  . Vaginal itching 08/05/2015  . Pre-diabetes 07/12/2015  . Essential hypertension 07/03/2015  . BMI 32.0-32.9,adult 07/03/2015  . Post-traumatic arthritis of ankle 12/20/2014  . Acute medial meniscal tear 02/06/2014  . Low bone mass 12/14/2012  . GAD (generalized anxiety disorder) 12/14/2012  . Unspecified vitamin D deficiency 12/14/2012    Orientation RESPIRATION BLADDER Height & Weight     Self, Time, Situation, Place  Normal Continent Weight: 225 lb (102.1 kg) Height:  5\' 9"  (175.3 cm)  BEHAVIORAL SYMPTOMS/MOOD NEUROLOGICAL BOWEL NUTRITION STATUS  Other (Comment) (no behaviors)   Continent Diet  AMBULATORY STATUS COMMUNICATION OF NEEDS Skin   Limited Assist Verbally Surgical wounds                       Personal Care Assistance Level of Assistance  Bathing, Feeding, Dressing Bathing Assistance: Limited assistance Feeding assistance: Independent Dressing Assistance: Limited assistance     Functional Limitations Info  Sight, Hearing, Speech Sight Info: Adequate Hearing Info: Adequate Speech  Info: Adequate    SPECIAL CARE FACTORS FREQUENCY  PT (By licensed PT), OT (By licensed OT)     PT Frequency: daily OT Frequency: 5 xwk            Contractures Contractures Info: Not present    Additional Factors Info  Code Status, Isolation Precautions Code Status Info: Full Code       Isolation Precautions Info: 11.6.17 Surgical pcr + MRSA     Current Medications (06/09/2016):  This is the current hospital active medication list Current Facility-Administered Medications  Medication Dose Route Frequency Provider Last Rate Last Dose  . 0.9 %  sodium chloride infusion   Intravenous Continuous Gaynelle Arabian, MD 75 mL/hr at 06/08/16 1809    . acetaminophen (TYLENOL) tablet 650 mg  650 mg Oral Q6H PRN Gaynelle Arabian, MD       Or  . acetaminophen (TYLENOL) suppository 650 mg  650 mg Rectal Q6H PRN Gaynelle Arabian, MD      . acetaminophen (TYLENOL) tablet 1,000 mg  1,000 mg Oral Q6H Gaynelle Arabian, MD   1,000 mg at 06/09/16 0532  . bisacodyl (DULCOLAX) suppository 10 mg  10 mg Rectal Daily PRN Gaynelle Arabian, MD      . diphenhydrAMINE (BENADRYL) 12.5 MG/5ML elixir 12.5-25 mg  12.5-25 mg Oral Q4H PRN Gaynelle Arabian, MD      . docusate sodium (COLACE) capsule 100 mg  100 mg Oral BID Gaynelle Arabian, MD   100 mg at 06/09/16 0836  . hydrochlorothiazide (HYDRODIURIL) tablet 25 mg  25 mg Oral Q2200 Gaynelle Arabian, MD  25 mg at 06/08/16 2121  . HYDROmorphone (DILAUDID) injection 0.5 mg  0.5 mg Intravenous Q2H PRN Gaynelle Arabian, MD      . menthol-cetylpyridinium (CEPACOL) lozenge 3 mg  1 lozenge Oral PRN Gaynelle Arabian, MD       Or  . phenol (CHLORASEPTIC) mouth spray 1 spray  1 spray Mouth/Throat PRN Gaynelle Arabian, MD      . methocarbamol (ROBAXIN) tablet 500 mg  500 mg Oral Q6H PRN Gaynelle Arabian, MD       Or  . methocarbamol (ROBAXIN) 500 mg in dextrose 5 % 50 mL IVPB  500 mg Intravenous Q6H PRN Gaynelle Arabian, MD   500 mg at 06/08/16 1609  . metoCLOPramide (REGLAN) tablet 5-10 mg  5-10 mg Oral  Q8H PRN Gaynelle Arabian, MD       Or  . metoCLOPramide (REGLAN) injection 5-10 mg  5-10 mg Intravenous Q8H PRN Gaynelle Arabian, MD      . ondansetron [facility]) tablet 4 mg  4 mg Oral Q6H PRN Gaynelle Arabian, MD       Or  . ondansetron [facility]) injection 4 mg  4 mg Intravenous Q6H PRN Gaynelle Arabian, MD      . oxyCODONE (Oxy IR/ROXICODONE) immediate release tablet 5-10 mg  5-10 mg Oral Q3H PRN Gaynelle Arabian, MD   10 mg at 06/09/16 0836  . polyethylene glycol (MIRALAX / GLYCOLAX) packet 17 g  17 g Oral Daily PRN Gaynelle Arabian, MD      . rivaroxaban Alveda Reasons) tablet 10 mg  10 mg Oral Q breakfast Gaynelle Arabian, MD   10 mg at 06/09/16 0836  . sodium phosphate (FLEET) 7-19 GM/118ML enema 1 enema  1 enema Rectal Once PRN Gaynelle Arabian, MD      . traMADol Veatrice Bourbon) tablet 50-100 mg  50-100 mg Oral Q6H PRN Gaynelle Arabian, MD   50 mg at 06/08/16 1809     Discharge Medications: Please see discharge summary for a list of discharge medications.  Relevant Imaging Results:  Relevant Lab Results:   Additional Information SS # [id number]  Vandy Fong, Randall An, LCSW

## 2016-06-09 NOTE — Progress Notes (Signed)
Physical Therapy Treatment Patient Details Name: Mary Moss MRN: [id number] DOB: August 23, 1935 Today's Date: 06/09/2016    History of Present Illness Pt s/p R TKR and with hx of L TKR 5/16    PT Comments    Pt motivated and progressing steadily with mobility.  Pt hopeful for dc to rehab setting tomorrow.  Follow Up Recommendations  SNF     Equipment Recommendations  None recommended by PT    Recommendations for Other Services OT consult     Precautions / Restrictions Precautions Precautions: Fall;Knee Required Braces or Orthoses: Knee Immobilizer - Right Knee Immobilizer - Right: Discontinue once straight leg raise with < 10 degree lag Restrictions Weight Bearing Restrictions: No Other Position/Activity Restrictions: WBAT    Mobility  Bed Mobility Overal bed mobility: Needs Assistance Bed Mobility: Sit to Supine       Sit to supine: Min assist   General bed mobility comments: cues for sequence and use of L LE to self assist  Transfers Overall transfer level: Needs assistance Equipment used: Rolling walker (2 wheeled) Transfers: Sit to/from Stand Sit to Stand: Min assist Stand pivot transfers: Min assist       General transfer comment: cues for LE management and use of UEs to self assist.  Stand pvt with RW recliner<>BSC  Ambulation/Gait Ambulation/Gait assistance: Min assist Ambulation Distance (Feet): 38 Feet Assistive device: Rolling walker (2 wheeled) Gait Pattern/deviations: Step-to pattern;Decreased step length - right;Decreased step length - left;Shuffle;Trunk flexed Gait velocity: decr Gait velocity interpretation: Below normal speed for age/gender General Gait Details: Increased time and cues for sequence, posture and position from [employer]            Wheelchair Mobility    Modified Rankin (Stroke Patients Only)       Balance                                    Cognition Arousal/Alertness:  Awake/alert Behavior During Therapy: WFL for tasks assessed/performed Overall Cognitive Status: Within Functional Limits for tasks assessed                      Exercises      General Comments        Pertinent Vitals/Pain Pain Assessment: 0-10 Pain Score: 5  Pain Location: R knee Pain Descriptors / Indicators: Aching;Sore Pain Intervention(s): Limited activity within patient's tolerance;Monitored during session;Premedicated before session;Ice applied    Home Living                      Prior Function            PT Goals (current goals can now be found in the care plan section) Acute Rehab PT Goals Patient Stated Goal: Regain IND to return home alone ASAP PT Goal Formulation: With patient Time For Goal Achievement: 06/13/16 Potential to Achieve Goals: Good Progress towards PT goals: Progressing toward goals    Frequency    7X/week      PT Plan Current plan remains appropriate    Co-evaluation             End of Session Equipment Utilized During Treatment: Gait belt;Right knee immobilizer Activity Tolerance: Patient tolerated treatment well Patient left: in bed;with call bell/phone within reach     Time: 1313-1340 PT Time Calculation (min) (ACUTE ONLY): 27 min  Charges:  $Gait Training: 8-22 mins $Therapeutic  Activity: 8-22 mins                    G Codes:      Izaiyah Kleinman 2016/06/11, 3:46 PM

## 2016-06-09 NOTE — Progress Notes (Signed)
   Subjective: 1 Day Post-Op Procedure(s) (LRB): RIGHT TOTAL KNEE ARTHROPLASTY (Right) Patient reports pain as mild.   Patient seen in rounds for Dr. Wynelle Link on rounds this morning. Patient is well, but has had some minor complaints of pain in the knee, requiring pain medications We will continue therapy today.  Plan is to go Skilled nursing facility after hospital stay.  Objective: Vital signs in last 24 hours: Temp:  [97.6 F (36.4 C)-98.6 F (37 C)] 98.3 F (36.8 C) (11/14 1400) Pulse Rate:  [64-87] 87 (11/14 1400) Resp:  [16] 16 (11/14 1400) BP: (118-134)/(57-71) 134/57 (11/14 1400) SpO2:  [94 %-98 %] 94 % (11/14 1400)  Intake/Output from previous day:  Intake/Output Summary (Last 24 hours) at 06/09/16 2208 Last data filed at 06/09/16 1830  Gross per 24 hour  Intake          1548.75 ml  Output             1560 ml  Net           -11.25 ml    Intake/Output this shift: No intake/output data recorded.  Labs:  Recent Labs  06/09/16 0345  HGB 12.5    Recent Labs  06/09/16 0345  WBC 10.9*  RBC 4.13  HCT 38.6  PLT 259    Recent Labs  06/09/16 0345  NA 136  K 4.8  CL 103  CO2 28  BUN 16  CREATININE 0.89  GLUCOSE 163*  CALCIUM 8.5*   No results for input(s): LABPT, INR in the last 72 hours.  EXAM General - Patient is Alert Extremity - Neurovascular intact Sensation intact distally Intact pulses distally Dorsiflexion/Plantar flexion intact Dressing - dressing C/D/I Motor Function - intact, moving foot and toes well on exam.  Hemovac pulled without difficulty.  Past Medical History:  Diagnosis Date  . Ankle fracture fx 13 yrs ago, anklw swells off and on, has cramps in ankle also   left, to seee dr hewitt about 02-16-14  . Arthritis   . Basal cell carcinoma 2015   Nose  . GERD (gastroesophageal reflux disease)    OTC med  . Hypertension   . Pneumonia     Assessment/Plan: 1 Day Post-Op Procedure(s) (LRB): RIGHT TOTAL KNEE ARTHROPLASTY  (Right) Principal Problem:   OA (osteoarthritis) of knee  Estimated body mass index is 33.23 kg/m as calculated from the following:   Height as of this encounter: 5\' 9"  (1.753 m).   Weight as of this encounter: 102.1 kg (225 lb). Up with therapy Plan for discharge tomorrow Discharge to SNF  DVT Prophylaxis - Xarelto Weight-Bearing as tolerated to right leg D/C O2 and Pulse OX and try on Room Air  Arlee Muslim, PA-C Orthopaedic Surgery 06/09/2016, 10:08 PM

## 2016-06-10 DIAGNOSIS — R278 Other lack of coordination: Secondary | ICD-10-CM | POA: Diagnosis not present

## 2016-06-10 DIAGNOSIS — Z96651 Presence of right artificial knee joint: Secondary | ICD-10-CM | POA: Diagnosis not present

## 2016-06-10 DIAGNOSIS — K5900 Constipation, unspecified: Secondary | ICD-10-CM | POA: Diagnosis not present

## 2016-06-10 DIAGNOSIS — E785 Hyperlipidemia, unspecified: Secondary | ICD-10-CM | POA: Diagnosis not present

## 2016-06-10 DIAGNOSIS — M6281 Muscle weakness (generalized): Secondary | ICD-10-CM | POA: Diagnosis not present

## 2016-06-10 DIAGNOSIS — E559 Vitamin D deficiency, unspecified: Secondary | ICD-10-CM | POA: Diagnosis not present

## 2016-06-10 DIAGNOSIS — F411 Generalized anxiety disorder: Secondary | ICD-10-CM | POA: Diagnosis not present

## 2016-06-10 DIAGNOSIS — R262 Difficulty in walking, not elsewhere classified: Secondary | ICD-10-CM | POA: Diagnosis not present

## 2016-06-10 DIAGNOSIS — R110 Nausea: Secondary | ICD-10-CM | POA: Diagnosis not present

## 2016-06-10 DIAGNOSIS — Z471 Aftercare following joint replacement surgery: Secondary | ICD-10-CM | POA: Diagnosis not present

## 2016-06-10 DIAGNOSIS — I10 Essential (primary) hypertension: Secondary | ICD-10-CM | POA: Diagnosis not present

## 2016-06-10 DIAGNOSIS — M62838 Other muscle spasm: Secondary | ICD-10-CM | POA: Diagnosis not present

## 2016-06-10 DIAGNOSIS — R52 Pain, unspecified: Secondary | ICD-10-CM | POA: Diagnosis not present

## 2016-06-10 DIAGNOSIS — K219 Gastro-esophageal reflux disease without esophagitis: Secondary | ICD-10-CM | POA: Diagnosis not present

## 2016-06-10 LAB — CBC
HEMATOCRIT: 36.3 % (ref 36.0–46.0)
Hemoglobin: 11.7 g/dL — ABNORMAL LOW (ref 12.0–15.0)
MCH: 30 pg (ref 26.0–34.0)
MCHC: 32.2 g/dL (ref 30.0–36.0)
MCV: 93.1 fL (ref 78.0–100.0)
Platelets: 266 10*3/uL (ref 150–400)
RBC: 3.9 MIL/uL (ref 3.87–5.11)
RDW: 13.1 % (ref 11.5–15.5)
WBC: 13.7 10*3/uL — ABNORMAL HIGH (ref 4.0–10.5)

## 2016-06-10 LAB — BASIC METABOLIC PANEL
Anion gap: 7 (ref 5–15)
BUN: 18 mg/dL (ref 6–20)
CALCIUM: 8.9 mg/dL (ref 8.9–10.3)
CO2: 30 mmol/L (ref 22–32)
CREATININE: 0.82 mg/dL (ref 0.44–1.00)
Chloride: 102 mmol/L (ref 101–111)
GFR calc Af Amer: >60 mL/min (ref >60)
GFR calc non Af Amer: >60 mL/min (ref >60)
GLUCOSE: 123 mg/dL — AB (ref 65–99)
Potassium: 3.8 mmol/L (ref 3.5–5.1)
Sodium: 139 mmol/L (ref 135–145)

## 2016-06-10 NOTE — Progress Notes (Signed)
Attempted to call report to [facility] no answer called rolled forward and no voice mail... Bethann Punches RN

## 2016-06-10 NOTE — Progress Notes (Signed)
Physical Therapy Treatment Patient Details Name: Mary Moss MRN: [id number] DOB: March 02, 1936 Today's Date: 06/10/2016    History of Present Illness Pt s/p R TKR and with hx of L TKR 5/16    PT Comments    Pt motivated and progressing with mobility but ltd this am by nausea with ambulation - RN aware.  Follow Up Recommendations  SNF     Equipment Recommendations  None recommended by PT    Recommendations for Other Services OT consult     Precautions / Restrictions Precautions Precautions: Fall;Knee Required Braces or Orthoses: Knee Immobilizer - Right Knee Immobilizer - Right: Discontinue once straight leg raise with < 10 degree lag Restrictions Weight Bearing Restrictions: No Other Position/Activity Restrictions: WBAT    Mobility  Bed Mobility Overal bed mobility: Needs Assistance Bed Mobility: Supine to Sit     Supine to sit: Min assist     General bed mobility comments: cues for sequence and use of L LE to self assist  Transfers Overall transfer level: Needs assistance Equipment used: Rolling walker (2 wheeled) Transfers: Sit to/from Stand Sit to Stand: Min assist         General transfer comment: cues for LE management and use of UEs to self assist.  Stand pvt with RW recliner<>BSC  Ambulation/Gait Ambulation/Gait assistance: Min guard;Min assist Ambulation Distance (Feet): 30 Feet Assistive device: Rolling walker (2 wheeled) Gait Pattern/deviations: Step-to pattern;Decreased step length - right;Decreased step length - left;Shuffle;Trunk flexed Gait velocity: decr Gait velocity interpretation: Below normal speed for age/gender General Gait Details: Increased time and cues for sequence, posture and position from RW.  Distance ltd by onset of nausea - RN aware   Stairs            Wheelchair Mobility    Modified Rankin (Stroke Patients Only)       Balance                                    Cognition  Arousal/Alertness: Awake/alert Behavior During Therapy: WFL for tasks assessed/performed Overall Cognitive Status: Within Functional Limits for tasks assessed                      Exercises Total Joint Exercises Ankle Circles/Pumps: AROM;Both;15 reps;Supine Quad Sets: AROM;Both;Supine;15 reps Heel Slides: AAROM;Right;Supine;20 reps Straight Leg Raises: AAROM;Right;Supine;20 reps    General Comments        Pertinent Vitals/Pain Pain Assessment: 0-10 Pain Score: 5  Pain Location: R knee Pain Descriptors / Indicators: Aching;Sore Pain Intervention(s): Limited activity within patient's tolerance;Monitored during session;Premedicated before session;Ice applied    Home Living                      Prior Function            PT Goals (current goals can now be found in the care plan section) Acute Rehab PT Goals Patient Stated Goal: Regain IND to return home alone ASAP PT Goal Formulation: With patient Time For Goal Achievement: 06/13/16 Potential to Achieve Goals: Good Progress towards PT goals: Progressing toward goals    Frequency    7X/week      PT Plan Current plan remains appropriate    Co-evaluation             End of Session Equipment Utilized During Treatment: Gait belt;Right knee immobilizer Activity Tolerance: Other (comment) (nausea) Patient left: in chair;with  call bell/phone within reach;with nursing/sitter in room     Time: [id number] PT Time Calculation (min) (ACUTE ONLY): 35 min  Charges:  $Gait Training: 8-22 mins $Therapeutic Exercise: 8-22 mins                    G Codes:      Alyse Kathan 06/30/16, 12:36 PM

## 2016-06-10 NOTE — Clinical Social Work Placement (Signed)
   CLINICAL SOCIAL WORK PLACEMENT  NOTE  Date:  06/10/2016  Patient Details  Name: Mary Moss MRN: [id number] Date of Birth: Jun 30, 1936  Clinical Social Work is seeking post-discharge placement for this patient at the [place] level of care (*CSW will initial, date and re-position this form in  chart as items are completed):  No   Patient/family provided with [facility] Work Department's list of facilities offering this level of care within the geographic area requested by the patient (or if unable, by the patient's family).  Yes   Patient/family informed of their freedom to choose among providers that offer the needed level of care, that participate in Medicare, Medicaid or managed care program needed by the patient, have an available bed and are willing to accept the patient.  Yes   Patient/family informed of [facility]'s ownership interest in [facility] and [facility], as well as of the fact that they are under no obligation to receive care at these facilities.  PASRR submitted to EDS on 06/09/16     PASRR number received on 06/09/16     Existing PASRR number confirmed on       FL2 transmitted to all facilities in geographic area requested by pt/family on 06/09/16     FL2 transmitted to all facilities within larger geographic area on       Patient informed that his/her managed care company has contracts with or will negotiate with certain facilities, including the following:        Yes   Patient/family informed of bed offers received.  Patient chooses bed at [facility]     Physician recommends and patient chooses bed at      Patient to be transferred to [facility] on 06/10/16.  Patient to be transferred to facility by [facility]     Patient family notified on 06/03/16 of transfer.  Name of family member notified:  Daughter     PHYSICIAN       Additional Comment: Pt / family are in agreement with d/c to  [facility] today. Pt requesting to go by car. D/C Summary sent to SNF for review. Scripts included in d/c packet. D/C packet provided to pt . # for report provided to nsg.   _______________________________________________ Luretha Rued, [place]  [phone] 06/10/2016, 12:23 PM

## 2016-06-10 NOTE — Progress Notes (Signed)
   Subjective: 2 Days Post-Op Procedure(s) (LRB): RIGHT TOTAL KNEE ARTHROPLASTY (Right) Patient reports pain as mild.   Patient seen in rounds for Dr. Wynelle Link. Patient is well, but has had some minor complaints of pain in the knee, requiring pain medications Patient is ready to go to the SNF today.  Objective: Vital signs in last 24 hours: Temp:  [98.2 F (36.8 C)-98.9 F (37.2 C)] 98.2 F (36.8 C) (11/15 0515) Pulse Rate:  [86-92] 86 (11/15 0515) Resp:  [16-18] 16 (11/15 0515) BP: (134-157)/(57-71) 147/71 (11/15 0515) SpO2:  [94 %-97 %] 97 % (11/15 0515)  Intake/Output from previous day:  Intake/Output Summary (Last 24 hours) at 06/10/16 1031 Last data filed at 06/10/16 1014  Gross per 24 hour  Intake              450 ml  Output             2050 ml  Net            -1600 ml    Intake/Output this shift: Total I/O In: 120 [P.O.:120] Out: 600 [Urine:600]  Labs:  Recent Labs  06/09/16 0345 06/10/16 0407  HGB 12.5 11.7*    Recent Labs  06/09/16 0345 06/10/16 0407  WBC 10.9* 13.7*  RBC 4.13 3.90  HCT 38.6 36.3  PLT 259 266    Recent Labs  06/09/16 0345 06/10/16 0407  NA 136 139  K 4.8 3.8  CL 103 102  CO2 28 30  BUN 16 18  CREATININE 0.89 0.82  GLUCOSE 163* 123*  CALCIUM 8.5* 8.9   No results for input(s): LABPT, INR in the last 72 hours.  EXAM: General - Patient is Alert, Appropriate and Oriented Extremity - Neurovascular intact Sensation intact distally Dorsiflexion/Plantar flexion intact Incision - clean, dry, no drainage Motor Function - intact, moving foot and toes well on exam.   Assessment/Plan: 2 Days Post-Op Procedure(s) (LRB): RIGHT TOTAL KNEE ARTHROPLASTY (Right) Procedure(s) (LRB): RIGHT TOTAL KNEE ARTHROPLASTY (Right) Past Medical History:  Diagnosis Date  . Ankle fracture fx 13 yrs ago, anklw swells off and on, has cramps in ankle also   left, to seee dr hewitt about 02-16-14  . Arthritis   . Basal cell carcinoma 2015   Nose  . GERD (gastroesophageal reflux disease)    OTC med  . Hypertension   . Pneumonia    Principal Problem:   OA (osteoarthritis) of knee  Estimated body mass index is 33.23 kg/m as calculated from the following:   Height as of this encounter: 5\' 9"  (1.753 m).   Weight as of this encounter: 102.1 kg (225 lb). Up with therapy Discharge to SNF Diet - Cardiac diet Follow up - in 2 weeks Activity - WBAT Disposition - Skilled nursing facility Condition Upon Discharge - Good D/C Meds - See DC Summary DVT Prophylaxis - Xarelto  Arlee Muslim, PA-C Orthopaedic Surgery 06/10/2016, 10:31 AM

## 2016-06-11 DIAGNOSIS — Z471 Aftercare following joint replacement surgery: Secondary | ICD-10-CM | POA: Diagnosis not present

## 2016-06-11 DIAGNOSIS — K219 Gastro-esophageal reflux disease without esophagitis: Secondary | ICD-10-CM | POA: Diagnosis not present

## 2016-06-11 DIAGNOSIS — I10 Essential (primary) hypertension: Secondary | ICD-10-CM | POA: Diagnosis not present

## 2016-06-11 DIAGNOSIS — E559 Vitamin D deficiency, unspecified: Secondary | ICD-10-CM | POA: Diagnosis not present

## 2016-06-16 ENCOUNTER — Ambulatory Visit: Payer: Medicare Other | Attending: Orthopedic Surgery | Admitting: Physical Therapy

## 2016-06-16 DIAGNOSIS — R269 Unspecified abnormalities of gait and mobility: Secondary | ICD-10-CM | POA: Diagnosis not present

## 2016-06-16 DIAGNOSIS — M25661 Stiffness of right knee, not elsewhere classified: Secondary | ICD-10-CM

## 2016-06-16 DIAGNOSIS — M25561 Pain in right knee: Secondary | ICD-10-CM | POA: Diagnosis not present

## 2016-06-16 DIAGNOSIS — R600 Localized edema: Secondary | ICD-10-CM | POA: Insufficient documentation

## 2016-06-16 DIAGNOSIS — M25672 Stiffness of left ankle, not elsewhere classified: Secondary | ICD-10-CM | POA: Insufficient documentation

## 2016-06-16 NOTE — Therapy (Signed)
[facility] [place], [place], [address] Phone: [phone]   Fax:  [phone]  Physical Therapy Evaluation  Patient Details  Name: Mary Moss MRN: [id number] Date of Birth: 03-Sep-1935 Referring Provider: Gaynelle Arabian MD.  Encounter Date: 06/16/2016      PT End of Session - 06/16/16 1006    Visit Number 1   Number of Visits 12   Date for PT Re-Evaluation 08/15/16   PT Start Time 0900   PT Stop Time 0959   PT Time Calculation (min) 59 min   Activity Tolerance Patient tolerated treatment well   Behavior During Therapy [facility] for tasks assessed/performed      Past Medical History:  Diagnosis Date  . Ankle fracture fx 13 yrs ago, anklw swells off and on, has cramps in ankle also   left, to seee dr hewitt about 02-16-14  . Arthritis   . Basal cell carcinoma 2015   Nose  . GERD (gastroesophageal reflux disease)    OTC med  . Hypertension   . Pneumonia     Past Surgical History:  Procedure Laterality Date  . ABDOMINAL HYSTERECTOMY    . EYE SURGERY Bilateral    bilaterla cataract extraction with IOL  . FRACTURE SURGERY Left 2001  . JOINT REPLACEMENT    . KNEE ARTHROSCOPY Right 02/07/2014   Procedure: RIGHT ARTHROSCOPY KNEE, WITH MEDIAL MENISCAL DEBRIDEMENT AND CHONDRAPLASTY;  Surgeon: Gearlean Alf, MD;  Location: [facility];  Service: Orthopedics;  Laterality: Right;  . ROTATOR CUFF REPAIR Right 2009  . TOTAL ANKLE ARTHROPLASTY Left 12/20/2014   Procedure: LEFT TOTAL ANKLE ARTHOPLASTY WITH PERCUTANEOUS ACHILLES TENDON LENGTHENING;  Surgeon: Wylene Simmer, MD;  Location: [place];  Service: Orthopedics;  Laterality: Left;  . TOTAL KNEE ARTHROPLASTY Right 06/08/2016   Procedure: RIGHT TOTAL KNEE ARTHROPLASTY;  Surgeon: Gaynelle Arabian, MD;  Location: [facility];  Service: Orthopedics;  Laterality: Right;    There were no vitals filed for this visit.       Subjective Assessment - 06/16/16 1016    Subjective The  patient underwent a right total knee replacement on 06/08/16.  She had a short stay at [facility].  She rates her pain at rest today at a 5/10.  Sheisambulatory with a FWW.   Patient Stated Goals Get back to normal.   Pain Score 5    Pain Location Knee   Pain Orientation Right   Pain Descriptors / Indicators Aching;Discomfort   Pain Type Surgical pain   Pain Frequency Constant            [facility] PT Assessment - 06/16/16 0001      Assessment   Medical Diagnosis S/p right total knee replacement.   Referring Provider Gaynelle Arabian MD.   Onset Date/Surgical Date --  06/08/16 (surgery date).     Precautions   Precautions --  No ultrasound.     Restrictions   Weight Bearing Restrictions No     Balance Screen   Has the patient fallen in the past 6 months No   Has the patient had a decrease in activity level because of a fear of falling?  Yes   Is the patient reluctant to leave their home because of a fear of falling?  No     Home Environment   Living Environment Private residence     Prior Function   Level of Independence Independent     Observation/Other Assessments   Observations Sterile dressing over right  knee.     Observation/Other Assessments-Edema    Edema --  Minimal+/moderate- right knee edema observed.     ROM / Strength   AROM / PROM / Strength AROM;Strength     AROM   Overall AROM Comments -15 degrees of active righ knee extension and passive to -10 degrees with active flexion= 80 degrees and passive to 90 degrees.     Strength   Overall Strength Comments Right hip flexion= 3-/5; right knee strength= 3+/5.     Palpation   Palpation comment C/o diffuse right knee pain.     Ambulation/Gait   Gait Comments The patient walks safely with a FWW with right knee held in flexion.                   [facility] Adult PT Treatment/Exercise - 06/16/16 0001      Exercises   Exercises Knee/Hip     Knee/Hip Exercises: Aerobic   Nustep Level  2 x 10 minutes moving forward x 2 to increase knee flexion.     Modalities   Modalities Vasopneumatic     Vasopneumatic   Number Minutes Vasopneumatic  15 minutes   Vasopnuematic Location  --  RT KNEE.   Vasopneumatic Pressure Medium                  PT Short Term Goals - 06/16/16 1024      PT SHORT TERM GOAL #1   Title Independent with an initial HEP.   Time 2   Period Weeks   Status New     PT SHORT TERM GOAL #2   Title Full active right knee extension to improve gait pattern.   Time 2   Period Weeks   Status New           PT Long Term Goals - 06/16/16 1025      PT LONG TERM GOAL #1   Title Independent with an advanced HEP.   Time 8   Period Weeks   Status New     PT LONG TERM GOAL #2   Title Active right knee flexion to 115 degrees+ so the patient can perform functional tasks and do so with pain not > 2-3/10.   Time 8   Period Weeks   Status New     PT LONG TERM GOAL #3   Title Increase right hip and knee strength to a solid 4+/5 to provide good stability for accomplishment of functional activities   Time 8   Period Weeks   Status New     PT LONG TERM GOAL #4   Title Perform a reciprocating stair gait with one railing with pain not > 2-3/10.   Time 8   Period Weeks   Status New     PT LONG TERM GOAL #5   Title Walk in clinic with a straight cane.   Time 8   Period Weeks   Status New               Plan - 06/16/16 1020    Clinical Impression Statement The patient presents to [facility] s/p right total knee replacement performed on 06/08/16.  She lacks right knee flexion and extension and right hip and knee strength.  She is walking with a FWW.   Rehab Potential Excellent   PT Frequency 3x / week   PT Duration 4 weeks   PT Treatment/Interventions ADLs/Self Care Home Management;Cryotherapy;Associate Professor;Therapeutic activities;Therapeutic exercise;Neuromuscular re-education;Patient/family  education;Passive range of motion;Manual  techniques;Vasopneumatic Device   PT Next Visit Plan Nustep; PROM; TKA protocol.  Please give pictures of a HEP (ie:  SLR's; heel slides; supine extension stretching; seated right knee flexion and extension; Vasopneumatic.      Patient will benefit from skilled therapeutic intervention in order to improve the following deficits and impairments:  Pain, Abnormal gait, Decreased range of motion, Decreased strength, Increased edema  Visit Diagnosis: Acute pain of right knee - Plan: PT plan of care cert/re-cert  Stiffness of right knee, not elsewhere classified - Plan: PT plan of care cert/re-cert  Localized edema - Plan: PT plan of care cert/re-cert      G-Codes - [id number]    Functional Assessment Tool Used Clinical judgement.   Functional Limitation Mobility: Walking and moving around   Mobility: Walking and Moving Around Current Status [phone]) At least 40 percent but less than 60 percent impaired, limited or restricted   Mobility: Walking and Moving Around Goal Status [phone]) At least 1 percent but less than 20 percent impaired, limited or restricted       Problem List Patient Active Problem List   Diagnosis Date Noted  . OA (osteoarthritis) of knee 06/08/2016  . Arthritis of knee, right 01/31/2016  . Hyperlipidemia 09/02/2015  . Encounter for preventive health examination 08/05/2015  . Vaginal itching 08/05/2015  . Pre-diabetes 07/12/2015  . Essential hypertension 07/03/2015  . BMI 32.0-32.9,adult 07/03/2015  . Post-traumatic arthritis of ankle 12/20/2014  . Acute medial meniscal tear 02/06/2014  . Low bone mass 12/14/2012  . GAD (generalized anxiety disorder) 12/14/2012  . Unspecified vitamin D deficiency 12/14/2012    Ramona Slinger, [place] MPT 06/16/2016, 10:30 AM  [facility] [address] [place], [place], [address] Phone: [phone]   Fax:  [phone]  Name: MIO PLACIDE MRN: [id number] Date of Birth: [birth year]

## 2016-06-17 ENCOUNTER — Encounter: Payer: Self-pay | Admitting: Physical Therapy

## 2016-06-17 ENCOUNTER — Ambulatory Visit: Payer: Medicare Other | Admitting: Physical Therapy

## 2016-06-17 DIAGNOSIS — R600 Localized edema: Secondary | ICD-10-CM | POA: Diagnosis not present

## 2016-06-17 DIAGNOSIS — M25672 Stiffness of left ankle, not elsewhere classified: Secondary | ICD-10-CM | POA: Diagnosis not present

## 2016-06-17 DIAGNOSIS — M25561 Pain in right knee: Secondary | ICD-10-CM

## 2016-06-17 DIAGNOSIS — R269 Unspecified abnormalities of gait and mobility: Secondary | ICD-10-CM | POA: Diagnosis not present

## 2016-06-17 DIAGNOSIS — M25661 Stiffness of right knee, not elsewhere classified: Secondary | ICD-10-CM | POA: Diagnosis not present

## 2016-06-17 NOTE — Patient Instructions (Signed)
Knee Extension Mobilization: Towel Prop   With rolled towel under right ankle, place _1-5___ pound weight across knee. Hold __5+__ minutes. Repeat __2-3__ times per set. Do __2__ sets per session. Do __2-4__ sessions per day.  Sitting knee extension stretch    Place one foot on table. Straighten leg and attempt to keep it straight, then push down until feel a stretch. Hold _30__ seconds. Repeat __5-10_ times each leg, alternating. Do _2-4__ sessions per day.      Knee Flexion Stretch on Step  Place foot on step and lean forward until you feel a good stretch in front of knee.   hold 30 sec x 5-10 perform 2-4 x daily   Chair Knee Flexion   Keeping feet on floor, slide foot of operated leg back, bending knee. Hold ___30_ seconds. Repeat __5__ times. Do __2-4__ sessions a day.  Heel Slide   Bend left knee and pull heel toward buttocks. Use strap around foot and pull strap with arms to assist knee to bend further. Hold 10 secs.  Repeat __10__ times. Do _4-6___ sessions per day.

## 2016-06-17 NOTE — Therapy (Signed)
[facility] [place], [place], [address] Phone: [phone]   Fax:  [phone]  Physical Therapy Treatment  Patient Details  Name: Mary Moss MRN: [id number] Date of Birth: [birth year] Referring Provider: Gaynelle Arabian MD.  Encounter Date: 06/17/2016      PT End of Session - 06/17/16 1316    Visit Number 2   Number of Visits 12   Date for PT Re-Evaluation 08/15/16   PT Start Time 1230   PT Stop Time 1327   PT Time Calculation (min) 57 min   Activity Tolerance Patient tolerated treatment well   Behavior During Therapy [facility] for tasks assessed/performed      Past Medical History:  Diagnosis Date  . Ankle fracture fx 13 yrs ago, anklw swells off and on, has cramps in ankle also   left, to seee dr hewitt about 02-16-14  . Arthritis   . Basal cell carcinoma 2015   Nose  . GERD (gastroesophageal reflux disease)    OTC med  . Hypertension   . Pneumonia     Past Surgical History:  Procedure Laterality Date  . ABDOMINAL HYSTERECTOMY    . EYE SURGERY Bilateral    bilaterla cataract extraction with IOL  . FRACTURE SURGERY Left 2001  . JOINT REPLACEMENT    . KNEE ARTHROSCOPY Right 02/07/2014   Procedure: RIGHT ARTHROSCOPY KNEE, WITH MEDIAL MENISCAL DEBRIDEMENT AND CHONDRAPLASTY;  Surgeon: Gearlean Alf, MD;  [facility];  Service: Orthopedics;  Laterality: Right;  . ROTATOR CUFF REPAIR Right 2009  . TOTAL ANKLE ARTHROPLASTY Left 12/20/2014   Procedure: LEFT TOTAL ANKLE ARTHOPLASTY WITH PERCUTANEOUS ACHILLES TENDON LENGTHENING;  Surgeon: Wylene Simmer, MD;  Location: [place];  Service: Orthopedics;  Laterality: Left;  . TOTAL KNEE ARTHROPLASTY Right 06/08/2016   Procedure: RIGHT TOTAL KNEE ARTHROPLASTY;  Surgeon: Gaynelle Arabian, MD;  [facility];  Service: Orthopedics;  Laterality: Right;    There were no vitals filed for this visit.      Subjective Assessment - 06/17/16 1236    Subjective Patient  arrived on walker today and reported some stiffness in knee   Patient Stated Goals Get back to normal.   Currently in Pain? Yes   Pain Score 5    Pain Location Knee   Pain Orientation Right   Pain Descriptors / Indicators Aching;Tightness   Pain Type Surgical pain   Pain Onset 1 to 4 weeks ago   Aggravating Factors  ROM or use   Pain Relieving Factors at rest            [facility] PT Assessment - 06/17/16 0001      ROM / Strength   AROM / PROM / Strength AROM;PROM     AROM   AROM Assessment Site Knee   Right/Left Knee Right   Right Knee Extension -13   Right Knee Flexion 81     PROM   PROM Assessment Site Knee   Right/Left Knee Right   Right Knee Extension -8   Right Knee Flexion 93                     [facility] Adult PT Treatment/Exercise - 06/17/16 0001      Knee/Hip Exercises: Stretches   Knee: Self-Stretch to increase Flexion Right;3 reps;20 seconds     Knee/Hip Exercises: Aerobic   Nustep L2-3 x15 UE/LE adjusted seat for ROM     Vasopneumatic   Number Minutes Vasopneumatic  15 minutes  Vasopnuematic Location  Knee   Vasopneumatic Pressure Medium     Manual Therapy   Manual Therapy Passive ROM   Passive ROM manual PROM for right knee flexion/ext with gentle range                PT Education - 06/17/16 1312    Education provided Yes   Education Details HEP self ROM   Person(s) Educated Patient   Methods Explanation;Demonstration;Handout   Comprehension Verbalized understanding;Returned demonstration          PT Short Term Goals - 06/17/16 1317      PT SHORT TERM GOAL #1   Title Independent with an initial HEP.   Time 2   Period Weeks   Status Achieved     PT SHORT TERM GOAL #2   Title Full active right knee extension to improve gait pattern.   Time 2   Period Weeks   Status On-going           PT Long Term Goals - 06/16/16 1025      PT LONG TERM GOAL #1   Title Independent with an advanced HEP.   Time 8   Period  Weeks   Status New     PT LONG TERM GOAL #2   Title Active right knee flexion to 115 degrees+ so the patient can perform functional tasks and do so with pain not > 2-3/10.   Time 8   Period Weeks   Status New     PT LONG TERM GOAL #3   Title Increase right hip and knee strength to a solid 4+/5 to provide good stability for accomplishment of functional activities   Time 8   Period Weeks   Status New     PT LONG TERM GOAL #4   Title Perform a reciprocating stair gait with one railing with pain not > 2-3/10.   Time 8   Period Weeks   Status New     PT LONG TERM GOAL #5   Title Walk in clinic with a straight cane.   Time 8   Period Weeks   Status New               Plan - 06/17/16 1318    Clinical Impression Statement Patient tolerated treatment well today with some ongoing reported pain and stiffness in right knee. Patient increased ROM in right knee for flexion and ext. Patient was given HEP for self stretches and educated on safety of standing stretches and daily ROM for both flexion and ext followed by elevation and ice. Patient met STG#1 today other goals ongoing due to ROM, pain and strength deficts.   Rehab Potential Excellent   PT Frequency 3x / week   PT Duration 4 weeks   PT Treatment/Interventions ADLs/Self Care Home Management;Cryotherapy;Associate Professor;Therapeutic activities;Therapeutic exercise;Neuromuscular re-education;Patient/family education;Passive range of motion;Manual techniques;Vasopneumatic Device   PT Next Visit Plan cont with POC for Nustep; PROM; TKA protocol.  Please give pictures of a HEP (ie:  SLR's; heel slides; supine extension stretching; seated right knee flexion and extension; Vasopneumatic. (MD. Maureen Ralphs 06/23/16)   Consulted and Agree with Plan of Care Patient      Patient will benefit from skilled therapeutic intervention in order to improve the following deficits and impairments:  Pain, Abnormal gait,  Decreased range of motion, Decreased strength, Increased edema  Visit Diagnosis: Acute pain of right knee  Stiffness of right knee, not elsewhere classified  Localized edema  Ankle stiffness, left  Abnormality of gait       G-Codes - 06-23-16 1009    Functional Assessment Tool Used Clinical judgement.   Functional Limitation Mobility: Walking and moving around   Mobility: Walking and Moving Around Current Status [phone]) At least 40 percent but less than 60 percent impaired, limited or restricted   Mobility: Walking and Moving Around Goal Status [phone]) At least 1 percent but less than 20 percent impaired, limited or restricted      Problem List Patient Active Problem List   Diagnosis Date Noted  . OA (osteoarthritis) of knee 06/08/2016  . Arthritis of knee, right 01/31/2016  . Hyperlipidemia 09/02/2015  . Encounter for preventive health examination 08/05/2015  . Vaginal itching 08/05/2015  . Pre-diabetes 07/12/2015  . Essential hypertension 07/03/2015  . BMI 32.0-32.9,adult 07/03/2015  . Post-traumatic arthritis of ankle 12/20/2014  . Acute medial meniscal tear 02/06/2014  . Low bone mass 12/14/2012  . GAD (generalized anxiety disorder) 12/14/2012  . Unspecified vitamin D deficiency 12/14/2012    Tashana Haberl P, PTA 06/17/2016, 1:30 PM  [facility] [place], [place], [address] Phone: [phone]   Fax:  [phone]  Name: Mary Moss MRN: [id number] Date of Birth: 08-07-1935

## 2016-06-22 ENCOUNTER — Ambulatory Visit: Payer: Medicare Other | Admitting: Physical Therapy

## 2016-06-22 DIAGNOSIS — M25661 Stiffness of right knee, not elsewhere classified: Secondary | ICD-10-CM

## 2016-06-22 DIAGNOSIS — R600 Localized edema: Secondary | ICD-10-CM

## 2016-06-22 DIAGNOSIS — M25672 Stiffness of left ankle, not elsewhere classified: Secondary | ICD-10-CM | POA: Diagnosis not present

## 2016-06-22 DIAGNOSIS — R269 Unspecified abnormalities of gait and mobility: Secondary | ICD-10-CM | POA: Diagnosis not present

## 2016-06-22 DIAGNOSIS — M25561 Pain in right knee: Secondary | ICD-10-CM

## 2016-06-22 NOTE — Therapy (Signed)
[facility] [place], [place], [address] Phone: [phone]   Fax:  [phone]  Physical Therapy Treatment  Patient Details  Name: Mary Moss MRN: [id number] Date of Birth: Dec 02, 1935 Referring Provider: Gaynelle Arabian MD.  Encounter Date: 06/22/2016      PT End of Session - 06/22/16 1119    Visit Number 3   Number of Visits 12   PT Start Time 1113   PT Stop Time [id number]   PT Time Calculation (min) 51 min   Activity Tolerance Patient tolerated treatment well   Behavior During Therapy [facility] for tasks assessed/performed      Past Medical History:  Diagnosis Date  . Ankle fracture fx 13 yrs ago, anklw swells off and on, has cramps in ankle also   left, to seee dr hewitt about 02-16-14  . Arthritis   . Basal cell carcinoma 2015   Nose  . GERD (gastroesophageal reflux disease)    OTC med  . Hypertension   . Pneumonia     Past Surgical History:  Procedure Laterality Date  . ABDOMINAL HYSTERECTOMY    . EYE SURGERY Bilateral    bilaterla cataract extraction with IOL  . FRACTURE SURGERY Left 2001  . JOINT REPLACEMENT    . KNEE ARTHROSCOPY Right 02/07/2014   Procedure: RIGHT ARTHROSCOPY KNEE, WITH MEDIAL MENISCAL DEBRIDEMENT AND CHONDRAPLASTY;  Surgeon: Gearlean Alf, MD;  [facility];  Service: Orthopedics;  Laterality: Right;  . ROTATOR CUFF REPAIR Right 2009  . TOTAL ANKLE ARTHROPLASTY Left 12/20/2014   Procedure: LEFT TOTAL ANKLE ARTHOPLASTY WITH PERCUTANEOUS ACHILLES TENDON LENGTHENING;  Surgeon: Wylene Simmer, MD;  Location: [place];  Service: Orthopedics;  Laterality: Left;  . TOTAL KNEE ARTHROPLASTY Right 06/08/2016   Procedure: RIGHT TOTAL KNEE ARTHROPLASTY;  Surgeon: Gaynelle Arabian, MD;  [facility];  Service: Orthopedics;  Laterality: Right;    There were no vitals filed for this visit.      Subjective Assessment - 06/22/16 1149    Subjective I think I stretched too much at home.  It's really  sore today.   Pain Score 5    Pain Location Knee   Pain Orientation Right   Pain Descriptors / Indicators Aching;Tightness   Pain Type Surgical pain   Pain Onset 1 to 4 weeks ago                         [facility] Adult PT Treatment/Exercise - 06/22/16 0001      Exercises   Exercises Knee/Hip     Knee/Hip Exercises: Aerobic   Nustep Level 3 x 15 minutes moving forward x 2 to increase flexion.     Modalities   Modalities Social worker Location --  Right knee.   Electrical Stimulation Action IFC   Electrical Stimulation Parameters 1-10 Hz at 100% scan x 15 minutes.   Electrical Stimulation Goals Edema;Pain     Vasopneumatic   Number Minutes Vasopneumatic  15 minutes   Vasopnuematic Location  --  Right knee.   Vasopneumatic Pressure Medium     Manual Therapy   Manual Therapy Passive ROM   Passive ROM PROM into right knee flexion and extension. x 8 minutes.                  PT Short Term Goals - 06/17/16 1317      PT SHORT TERM GOAL #1  Title Independent with an initial HEP.   Time 2   Period Weeks   Status Achieved     PT SHORT TERM GOAL #2   Title Full active right knee extension to improve gait pattern.   Time 2   Period Weeks   Status On-going           PT Long Term Goals - 06/16/16 1025      PT LONG TERM GOAL #1   Title Independent with an advanced HEP.   Time 8   Period Weeks   Status New     PT LONG TERM GOAL #2   Title Active right knee flexion to 115 degrees+ so the patient can perform functional tasks and do so with pain not > 2-3/10.   Time 8   Period Weeks   Status New     PT LONG TERM GOAL #3   Title Increase right hip and knee strength to a solid 4+/5 to provide good stability for accomplishment of functional activities   Time 8   Period Weeks   Status New     PT LONG TERM GOAL #4   Title Perform a reciprocating stair gait with one railing with  pain not > 2-3/10.   Time 8   Period Weeks   Status New     PT LONG TERM GOAL #5   Title Walk in clinic with a straight cane.   Time 8   Period Weeks   Status New               Plan - 06/22/16 1206    PT Treatment/Interventions ADLs/Self Care Home Management;Cryotherapy;Associate Professor;Therapeutic activities;Therapeutic exercise;Neuromuscular re-education;Patient/family education;Passive range of motion;Manual techniques;Vasopneumatic Device      Patient will benefit from skilled therapeutic intervention in order to improve the following deficits and impairments:  Pain, Abnormal gait, Decreased range of motion, Decreased strength, Increased edema  Visit Diagnosis: Acute pain of right knee  Stiffness of right knee, not elsewhere classified  Localized edema     Problem List Patient Active Problem List   Diagnosis Date Noted  . OA (osteoarthritis) of knee 06/08/2016  . Arthritis of knee, right 01/31/2016  . Hyperlipidemia 09/02/2015  . Encounter for preventive health examination 08/05/2015  . Vaginal itching 08/05/2015  . Pre-diabetes 07/12/2015  . Essential hypertension 07/03/2015  . BMI 32.0-32.9,adult 07/03/2015  . Post-traumatic arthritis of ankle 12/20/2014  . Acute medial meniscal tear 02/06/2014  . Low bone mass 12/14/2012  . GAD (generalized anxiety disorder) 12/14/2012  . Unspecified vitamin D deficiency 12/14/2012    Brandilyn Nanninga, [place] MPT 06/22/2016, 12:09 PM  [facility] [address] [place], [place], [address] Phone: [phone]   Fax:  [phone]  Name: Mary Moss MRN: [id number] Date of Birth: 09-25-1935

## 2016-06-23 DIAGNOSIS — Z96651 Presence of right artificial knee joint: Secondary | ICD-10-CM | POA: Diagnosis not present

## 2016-06-23 DIAGNOSIS — Z471 Aftercare following joint replacement surgery: Secondary | ICD-10-CM | POA: Diagnosis not present

## 2016-06-24 ENCOUNTER — Ambulatory Visit: Payer: Medicare Other | Admitting: Physical Therapy

## 2016-06-24 ENCOUNTER — Encounter: Payer: Self-pay | Admitting: Physical Therapy

## 2016-06-24 DIAGNOSIS — R269 Unspecified abnormalities of gait and mobility: Secondary | ICD-10-CM | POA: Diagnosis not present

## 2016-06-24 DIAGNOSIS — M25661 Stiffness of right knee, not elsewhere classified: Secondary | ICD-10-CM | POA: Diagnosis not present

## 2016-06-24 DIAGNOSIS — M25561 Pain in right knee: Secondary | ICD-10-CM | POA: Diagnosis not present

## 2016-06-24 DIAGNOSIS — R600 Localized edema: Secondary | ICD-10-CM

## 2016-06-24 DIAGNOSIS — M25672 Stiffness of left ankle, not elsewhere classified: Secondary | ICD-10-CM | POA: Diagnosis not present

## 2016-06-24 NOTE — Therapy (Signed)
[facility] [place], [place], [address] Phone: [phone]   Fax:  [phone]  Physical Therapy Treatment  Patient Details  Name: Mary Moss MRN: [id number] Date of Birth: June 11, 1936 Referring Provider: Gaynelle Arabian MD.  Encounter Date: 06/24/2016      PT End of Session - 06/24/16 1500    Visit Number 4   Number of Visits 12   Date for PT Re-Evaluation 08/15/16   PT Start Time 1440   PT Stop Time 1521   PT Time Calculation (min) 41 min   Activity Tolerance Patient tolerated treatment well   Behavior During Therapy [facility] for tasks assessed/performed      Past Medical History:  Diagnosis Date  . Ankle fracture fx 13 yrs ago, anklw swells off and on, has cramps in ankle also   left, to seee dr hewitt about 02-16-14  . Arthritis   . Basal cell carcinoma 2015   Nose  . GERD (gastroesophageal reflux disease)    OTC med  . Hypertension   . Pneumonia     Past Surgical History:  Procedure Laterality Date  . ABDOMINAL HYSTERECTOMY    . EYE SURGERY Bilateral    bilaterla cataract extraction with IOL  . FRACTURE SURGERY Left 2001  . JOINT REPLACEMENT    . KNEE ARTHROSCOPY Right 02/07/2014   Procedure: RIGHT ARTHROSCOPY KNEE, WITH MEDIAL MENISCAL DEBRIDEMENT AND CHONDRAPLASTY;  Surgeon: Gearlean Alf, MD;  [facility]: [facility];  Service: Orthopedics;  Laterality: Right;  . ROTATOR CUFF REPAIR Right 2009  . TOTAL ANKLE ARTHROPLASTY Left 12/20/2014   Procedure: LEFT TOTAL ANKLE ARTHOPLASTY WITH PERCUTANEOUS ACHILLES TENDON LENGTHENING;  Surgeon: Wylene Simmer, MD;  Location: [place];  Service: Orthopedics;  Laterality: Left;  . TOTAL KNEE ARTHROPLASTY Right 06/08/2016   Procedure: RIGHT TOTAL KNEE ARTHROPLASTY;  Surgeon: Gaynelle Arabian, MD;  [facility];  Service: Orthopedics;  Laterality: Right;    There were no vitals filed for this visit.      Subjective Assessment - 06/24/16 1442    Subjective Patient went  to MD and he said she was doing well and to continue thrapy   Patient Stated Goals Get back to normal.   Currently in Pain? Yes   Pain Score 6    Pain Location Knee   Pain Orientation Right   Pain Descriptors / Indicators Sore   Pain Type Surgical pain   Pain Onset 1 to 4 weeks ago   Pain Frequency Constant   Aggravating Factors  ROM   Pain Relieving Factors at rest            [facility] PT Assessment - 06/24/16 0001      AROM   AROM Assessment Site Knee   Right/Left Knee Right   Right Knee Extension -11   Right Knee Flexion 94     PROM   PROM Assessment Site Knee   Right/Left Knee Right   Right Knee Extension -6   Right Knee Flexion 102                     [facility] Adult PT Treatment/Exercise - 06/24/16 0001      Knee/Hip Exercises: Aerobic   Nustep Level 3 x 15 minutes moving forward to increase flexion.     Acupuncturist Location right knee   Chartered certified accountant IFC   Electrical Stimulation Parameters 1-10hz  x32min   Printmaker Goals Edema;Pain     Vasopneumatic  Number Minutes Vasopneumatic  15 minutes   Vasopnuematic Location  Knee   Vasopneumatic Pressure Medium     Manual Therapy   Manual Therapy Passive ROM   Passive ROM PROM into right knee flexion and extension with low load holds                  PT Short Term Goals - 06/17/16 1317      PT SHORT TERM GOAL #1   Title Independent with an initial HEP.   Time 2   Period Weeks   Status Achieved     PT SHORT TERM GOAL #2   Title Full active right knee extension to improve gait pattern.   Time 2   Period Weeks   Status On-going           PT Long Term Goals - 06/16/16 1025      PT LONG TERM GOAL #1   Title Independent with an advanced HEP.   Time 8   Period Weeks   Status New     PT LONG TERM GOAL #2   Title Active right knee flexion to 115 degrees+ so the patient can perform functional tasks and do so with  pain not > 2-3/10.   Time 8   Period Weeks   Status New     PT LONG TERM GOAL #3   Title Increase right hip and knee strength to a solid 4+/5 to provide good stability for accomplishment of functional activities   Time 8   Period Weeks   Status New     PT LONG TERM GOAL #4   Title Perform a reciprocating stair gait with one railing with pain not > 2-3/10.   Time 8   Period Weeks   Status New     PT LONG TERM GOAL #5   Title Walk in clinic with a straight cane.   Time 8   Period Weeks   Status New               Plan - 06/24/16 1511    Clinical Impression Statement Patient tolerated treatment well and was able toimprove ROM for right knee flexion and ext both active and passively today. Patient reports self stretches daily. Patient has ongoing pain due to not being able to tolerate pain meds. Patient current goals progressing yet ongoing due to ROM, strength and pain deficts.    Rehab Potential Excellent   PT Frequency 3x / week   PT Duration 4 weeks   PT Treatment/Interventions ADLs/Self Care Home Management;Cryotherapy;Associate Professor;Therapeutic activities;Therapeutic exercise;Neuromuscular re-education;Patient/family education;Passive range of motion;Manual techniques;Vasopneumatic Device   PT Next Visit Plan cont with POC for ROM and add strengthening exercises next treatment per patient tolerance   Consulted and Agree with Plan of Care Patient      Patient will benefit from skilled therapeutic intervention in order to improve the following deficits and impairments:  Pain, Abnormal gait, Decreased range of motion, Decreased strength, Increased edema  Visit Diagnosis: Acute pain of right knee  Stiffness of right knee, not elsewhere classified  Localized edema     Problem List Patient Active Problem List   Diagnosis Date Noted  . OA (osteoarthritis) of knee 06/08/2016  . Arthritis of knee, right 01/31/2016  .  Hyperlipidemia 09/02/2015  . Encounter for preventive health examination 08/05/2015  . Vaginal itching 08/05/2015  . Pre-diabetes 07/12/2015  . Essential hypertension 07/03/2015  . BMI 32.0-32.9,adult 07/03/2015  . Post-traumatic arthritis of ankle 12/20/2014  .  Acute medial meniscal tear 02/06/2014  . Low bone mass 12/14/2012  . GAD (generalized anxiety disorder) 12/14/2012  . Unspecified vitamin D deficiency 12/14/2012    Phillips Climes, PTA 06/24/2016, 3:22 PM  [facility] [place], [place], [address] Phone: [phone]   Fax:  [phone]  Name: WYNTER PRESCHER MRN: [id number] Date of Birth: 20-Jun-1936

## 2016-06-26 ENCOUNTER — Ambulatory Visit: Payer: Medicare Other | Attending: Orthopedic Surgery | Admitting: Physical Therapy

## 2016-06-26 DIAGNOSIS — M25561 Pain in right knee: Secondary | ICD-10-CM | POA: Insufficient documentation

## 2016-06-26 DIAGNOSIS — M25661 Stiffness of right knee, not elsewhere classified: Secondary | ICD-10-CM | POA: Insufficient documentation

## 2016-06-26 DIAGNOSIS — R600 Localized edema: Secondary | ICD-10-CM | POA: Diagnosis not present

## 2016-06-26 NOTE — Therapy (Signed)
[facility] [place], [place], [address] Phone: [phone]   Fax:  [phone]  Physical Therapy Treatment  Patient Details  Name: Mary Moss MRN: [id number] Date of Birth: 1935/10/23 Referring Provider: Gaynelle Arabian MD.  Encounter Date: 06/26/2016      PT End of Session - 06/26/16 1133    Visit Number 5   Number of Visits 12   Date for PT Re-Evaluation 08/15/16   PT Start Time 1118   PT Stop Time 1215   PT Time Calculation (min) 57 min   Activity Tolerance Patient tolerated treatment well   Behavior During Therapy [facility] for tasks assessed/performed      Past Medical History:  Diagnosis Date  . Ankle fracture fx 13 yrs ago, anklw swells off and on, has cramps in ankle also   left, to seee dr hewitt about 02-16-14  . Arthritis   . Basal cell carcinoma 2015   Nose  . GERD (gastroesophageal reflux disease)    OTC med  . Hypertension   . Pneumonia     Past Surgical History:  Procedure Laterality Date  . ABDOMINAL HYSTERECTOMY    . EYE SURGERY Bilateral    bilaterla cataract extraction with IOL  . FRACTURE SURGERY Left 2001  . JOINT REPLACEMENT    . KNEE ARTHROSCOPY Right 02/07/2014   Procedure: RIGHT ARTHROSCOPY KNEE, WITH MEDIAL MENISCAL DEBRIDEMENT AND CHONDRAPLASTY;  Surgeon: Gearlean Alf, MD;  [facility];  Service: Orthopedics;  Laterality: Right;  . ROTATOR CUFF REPAIR Right 2009  . TOTAL ANKLE ARTHROPLASTY Left 12/20/2014   Procedure: LEFT TOTAL ANKLE ARTHOPLASTY WITH PERCUTANEOUS ACHILLES TENDON LENGTHENING;  Surgeon: Wylene Simmer, MD;  Location: [place];  Service: Orthopedics;  Laterality: Left;  . TOTAL KNEE ARTHROPLASTY Right 06/08/2016   Procedure: RIGHT TOTAL KNEE ARTHROPLASTY;  Surgeon: Gaynelle Arabian, MD;  [facility];  Service: Orthopedics;  Laterality: Right;    There were no vitals filed for this visit.      Subjective Assessment - 06/26/16 1134    Subjective No new  complaints.   Patient Stated Goals Get back to normal.   Pain Score 5    Pain Location Knee   Pain Orientation Right   Pain Descriptors / Indicators Sore   Pain Type Surgical pain   Pain Onset 1 to 4 weeks ago                         [facility] Adult PT Treatment/Exercise - 06/26/16 0001      Exercises   Exercises Knee/Hip;Ankle     Knee/Hip Exercises: Aerobic   Nustep Level 3 x 15 minutes moving forward x 2 to increase knee flexion.     Knee/Hip Exercises: Standing   Rocker Board Limitations 3 minutes.     Acupuncturist Location --  RT KNEE.   Electrical Stimulation Action IFC   Electrical Stimulation Parameters 1-10 Hz x 20 minutes.   Electrical Stimulation Goals Edema;Pain     Vasopneumatic   Number Minutes Vasopneumatic  20 minutes   Vasopnuematic Location  --  RT KNEE.   Vasopneumatic Pressure Medium     Manual Therapy   Manual Therapy Passive ROM   Passive ROM PROM into right knee flexion and extension x 5 minutes.                  PT Short Term Goals - 06/17/16 1317  PT SHORT TERM GOAL #1   Title Independent with an initial HEP.   Time 2   Period Weeks   Status Achieved     PT SHORT TERM GOAL #2   Title Full active right knee extension to improve gait pattern.   Time 2   Period Weeks   Status On-going           PT Long Term Goals - 06/16/16 1025      PT LONG TERM GOAL #1   Title Independent with an advanced HEP.   Time 8   Period Weeks   Status New     PT LONG TERM GOAL #2   Title Active right knee flexion to 115 degrees+ so the patient can perform functional tasks and do so with pain not > 2-3/10.   Time 8   Period Weeks   Status New     PT LONG TERM GOAL #3   Title Increase right hip and knee strength to a solid 4+/5 to provide good stability for accomplishment of functional activities   Time 8   Period Weeks   Status New     PT LONG TERM GOAL #4   Title Perform a  reciprocating stair gait with one railing with pain not > 2-3/10.   Time 8   Period Weeks   Status New     PT LONG TERM GOAL #5   Title Walk in clinic with a straight cane.   Time 8   Period Weeks   Status New             Patient will benefit from skilled therapeutic intervention in order to improve the following deficits and impairments:  Pain, Abnormal gait, Decreased range of motion, Decreased strength, Increased edema  Visit Diagnosis: Acute pain of right knee  Stiffness of right knee, not elsewhere classified  Localized edema     Problem List Patient Active Problem List   Diagnosis Date Noted  . OA (osteoarthritis) of knee 06/08/2016  . Arthritis of knee, right 01/31/2016  . Hyperlipidemia 09/02/2015  . Encounter for preventive health examination 08/05/2015  . Vaginal itching 08/05/2015  . Pre-diabetes 07/12/2015  . Essential hypertension 07/03/2015  . BMI 32.0-32.9,adult 07/03/2015  . Post-traumatic arthritis of ankle 12/20/2014  . Acute medial meniscal tear 02/06/2014  . Low bone mass 12/14/2012  . GAD (generalized anxiety disorder) 12/14/2012  . Unspecified vitamin D deficiency 12/14/2012    Drew Lips, [place] 06/26/2016, 12:19 PM  [facility] [address] [place], [place], [address] Phone: [phone]   Fax:  [phone]  Name: Mary Moss MRN: [id number] Date of Birth: 1936/07/15

## 2016-06-29 ENCOUNTER — Ambulatory Visit: Payer: Medicare Other | Admitting: Physical Therapy

## 2016-06-29 ENCOUNTER — Encounter: Payer: Self-pay | Admitting: Physical Therapy

## 2016-06-29 DIAGNOSIS — M25661 Stiffness of right knee, not elsewhere classified: Secondary | ICD-10-CM | POA: Diagnosis not present

## 2016-06-29 DIAGNOSIS — M25561 Pain in right knee: Secondary | ICD-10-CM

## 2016-06-29 DIAGNOSIS — R600 Localized edema: Secondary | ICD-10-CM

## 2016-06-29 NOTE — Therapy (Signed)
[facility] [place], [place], [address] Phone: [phone]   Fax:  [phone]  Physical Therapy Treatment  Patient Details  Name: Mary Moss MRN: [id number] Date of Birth: Apr 11, 1936 Referring Provider: Gaynelle Arabian MD.  Encounter Date: 06/29/2016      PT End of Session - 06/29/16 1140    Visit Number 6   Number of Visits 12   Date for PT Re-Evaluation 08/15/16   PT Start Time 1115   PT Stop Time 1200   PT Time Calculation (min) 45 min   Activity Tolerance Patient tolerated treatment well   Behavior During Therapy [facility] for tasks assessed/performed      Past Medical History:  Diagnosis Date  . Ankle fracture fx 13 yrs ago, anklw swells off and on, has cramps in ankle also   left, to seee dr hewitt about 02-16-14  . Arthritis   . Basal cell carcinoma 2015   Nose  . GERD (gastroesophageal reflux disease)    OTC med  . Hypertension   . Pneumonia     Past Surgical History:  Procedure Laterality Date  . ABDOMINAL HYSTERECTOMY    . EYE SURGERY Bilateral    bilaterla cataract extraction with IOL  . FRACTURE SURGERY Left 2001  . JOINT REPLACEMENT    . KNEE ARTHROSCOPY Right 02/07/2014   Procedure: RIGHT ARTHROSCOPY KNEE, WITH MEDIAL MENISCAL DEBRIDEMENT AND CHONDRAPLASTY;  Surgeon: Gearlean Alf, MD;  [facility];  Service: Orthopedics;  Laterality: Right;  . ROTATOR CUFF REPAIR Right 2009  . TOTAL ANKLE ARTHROPLASTY Left 12/20/2014   Procedure: LEFT TOTAL ANKLE ARTHOPLASTY WITH PERCUTANEOUS ACHILLES TENDON LENGTHENING;  Surgeon: Wylene Simmer, MD;  Location: [place];  Service: Orthopedics;  Laterality: Left;  . TOTAL KNEE ARTHROPLASTY Right 06/08/2016   Procedure: RIGHT TOTAL KNEE ARTHROPLASTY;  Surgeon: Gaynelle Arabian, MD;  [facility];  Service: Orthopedics;  Laterality: Right;    There were no vitals filed for this visit.      Subjective Assessment - 06/29/16 1127    Subjective patient  reported increased stiffness in knee today   Patient Stated Goals Get back to normal.   Currently in Pain? Yes   Pain Score 7    Pain Location Knee   Pain Orientation Right   Pain Descriptors / Indicators Sore  stiff   Pain Type Surgical pain   Pain Onset 1 to 4 weeks ago   Pain Frequency Constant   Aggravating Factors  ROM   Pain Relieving Factors at rest            [facility] PT Assessment - 06/29/16 0001      AROM   AROM Assessment Site Knee   Right/Left Knee Right   Right Knee Flexion 93     PROM   PROM Assessment Site Knee   Right/Left Knee Right   Right Knee Flexion 100                     [facility] Adult PT Treatment/Exercise - 06/29/16 0001      Knee/Hip Exercises: Aerobic   Nustep Level 3 x 15 minutes moving forward x 2 to increase knee flexion.     Knee/Hip Exercises: Standing   Rocker Board Limitations 3 minutes.     Acupuncturist Location right knee   Chartered certified accountant IFC   Electrical Stimulation Parameters 1-10hz  x65min   Printmaker Goals Edema;Pain     Vasopneumatic  Number Minutes Vasopneumatic  15 minutes   Vasopnuematic Location  Knee   Vasopneumatic Pressure Medium     Manual Therapy   Manual Therapy Passive ROM   Passive ROM PROM into right knee flexion and extension with low load holds                  PT Short Term Goals - 06/17/16 1317      PT SHORT TERM GOAL #1   Title Independent with an initial HEP.   Time 2   Period Weeks   Status Achieved     PT SHORT TERM GOAL #2   Title Full active right knee extension to improve gait pattern.   Time 2   Period Weeks   Status On-going           PT Long Term Goals - 06/16/16 1025      PT LONG TERM GOAL #1   Title Independent with an advanced HEP.   Time 8   Period Weeks   Status New     PT LONG TERM GOAL #2   Title Active right knee flexion to 115 degrees+ so the patient can perform functional tasks  and do so with pain not > 2-3/10.   Time 8   Period Weeks   Status New     PT LONG TERM GOAL #3   Title Increase right hip and knee strength to a solid 4+/5 to provide good stability for accomplishment of functional activities   Time 8   Period Weeks   Status New     PT LONG TERM GOAL #4   Title Perform a reciprocating stair gait with one railing with pain not > 2-3/10.   Time 8   Period Weeks   Status New     PT LONG TERM GOAL #5   Title Walk in clinic with a straight cane.   Time 8   Period Weeks   Status New               Plan - 06/29/16 1148    Clinical Impression Statement Patient tolerated treatment well today. Patient arrived with reported stiff and sore knee. Patient had some lack of ROM in knee today may be due to pain and swelling. Patient reported not doing self stretches daily. Patient was educated on self ROM daily to improve ROM and functional independence. Patient current goals ongoing due to pain, ROM, strength and edema deficts.   Rehab Potential Excellent   PT Frequency 3x / week   PT Duration 4 weeks   PT Treatment/Interventions ADLs/Self Care Home Management;Cryotherapy;Associate Professor;Therapeutic activities;Therapeutic exercise;Neuromuscular re-education;Patient/family education;Passive range of motion;Manual techniques;Vasopneumatic Device   PT Next Visit Plan cont with POC for ROM and add strengthening exercises next treatment per patient tolerance   Consulted and Agree with Plan of Care Patient      Patient will benefit from skilled therapeutic intervention in order to improve the following deficits and impairments:  Pain, Abnormal gait, Decreased range of motion, Decreased strength, Increased edema  Visit Diagnosis: Acute pain of right knee  Stiffness of right knee, not elsewhere classified  Localized edema     Problem List Patient Active Problem List   Diagnosis Date Noted  . OA (osteoarthritis) of  knee 06/08/2016  . Arthritis of knee, right 01/31/2016  . Hyperlipidemia 09/02/2015  . Encounter for preventive health examination 08/05/2015  . Vaginal itching 08/05/2015  . Pre-diabetes 07/12/2015  . Essential hypertension 07/03/2015  . BMI  32.0-32.9,adult 07/03/2015  . Post-traumatic arthritis of ankle 12/20/2014  . Acute medial meniscal tear 02/06/2014  . Low bone mass 12/14/2012  . GAD (generalized anxiety disorder) 12/14/2012  . Unspecified vitamin D deficiency 12/14/2012    Phillips Climes, PTA 06/29/2016, 12:01 PM  [facility] [address] [place], [place], [address] Phone: [phone]   Fax:  [phone]  Name: Mary Moss MRN: [id number] Date of Birth: 06/22/1936

## 2016-07-01 ENCOUNTER — Ambulatory Visit: Payer: Medicare Other | Admitting: Physical Therapy

## 2016-07-01 DIAGNOSIS — M25561 Pain in right knee: Secondary | ICD-10-CM | POA: Diagnosis not present

## 2016-07-01 DIAGNOSIS — M25661 Stiffness of right knee, not elsewhere classified: Secondary | ICD-10-CM | POA: Diagnosis not present

## 2016-07-01 DIAGNOSIS — R600 Localized edema: Secondary | ICD-10-CM | POA: Diagnosis not present

## 2016-07-01 NOTE — Therapy (Signed)
[facility] [place], [place], [address] Phone: [phone]   Fax:  [phone]  Physical Therapy Treatment  Patient Details  Name: Mary Moss MRN: [id number] Date of Birth: [birth year] Referring Provider: Gaynelle Arabian MD.  Encounter Date: 07/01/2016      PT End of Session - 07/01/16 1346    Visit Number 7   Number of Visits 12   Date for PT Re-Evaluation 08/15/16   PT Start Time 0945   PT Stop Time 1040   PT Time Calculation (min) 55 min   Activity Tolerance Patient tolerated treatment well   Behavior During Therapy [facility] for tasks assessed/performed      Past Medical History:  Diagnosis Date  . Ankle fracture fx 13 yrs ago, anklw swells off and on, has cramps in ankle also   left, to seee dr hewitt about 02-16-14  . Arthritis   . Basal cell carcinoma 2015   Nose  . GERD (gastroesophageal reflux disease)    OTC med  . Hypertension   . Pneumonia     Past Surgical History:  Procedure Laterality Date  . ABDOMINAL HYSTERECTOMY    . EYE SURGERY Bilateral    bilaterla cataract extraction with IOL  . FRACTURE SURGERY Left 2001  . JOINT REPLACEMENT    . KNEE ARTHROSCOPY Right 02/07/2014   Procedure: RIGHT ARTHROSCOPY KNEE, WITH MEDIAL MENISCAL DEBRIDEMENT AND CHONDRAPLASTY;  Surgeon: Gearlean Alf, MD;  [facility];  Service: Orthopedics;  Laterality: Right;  . ROTATOR CUFF REPAIR Right 2009  . TOTAL ANKLE ARTHROPLASTY Left 12/20/2014   Procedure: LEFT TOTAL ANKLE ARTHOPLASTY WITH PERCUTANEOUS ACHILLES TENDON LENGTHENING;  Surgeon: Wylene Simmer, MD;  Location: [place];  Service: Orthopedics;  Laterality: Left;  . TOTAL KNEE ARTHROPLASTY Right 06/08/2016   Procedure: RIGHT TOTAL KNEE ARTHROPLASTY;  Surgeon: Gaynelle Arabian, MD;  [facility];  Service: Orthopedics;  Laterality: Right;    There were no vitals filed for this visit.      Subjective Assessment - 07/01/16 1345    Subjective My knee is  hurting more today.  I did too much at home.   Patient Stated Goals Get back to normal.   Pain Score 8    Pain Location Knee   Pain Orientation Right   Pain Descriptors / Indicators Sore   Pain Type Surgical pain   Pain Onset 1 to 4 weeks ago     Treatment:  Nustep level 4 x 15 minutes moving forward x 2 to increase flexion f/b rockerboard x 5 minutes f/b PROM to patient's right knee x 6 minutes f/b medium vasopneumatic and IFC x 15 minutes.                              PT Short Term Goals - 06/17/16 1317      PT SHORT TERM GOAL #1   Title Independent with an initial HEP.   Time 2   Period Weeks   Status Achieved     PT SHORT TERM GOAL #2   Title Full active right knee extension to improve gait pattern.   Time 2   Period Weeks   Status On-going           PT Long Term Goals - 06/16/16 1025      PT LONG TERM GOAL #1   Title Independent with an advanced HEP.   Time 8   Period Weeks  Status New     PT LONG TERM GOAL #2   Title Active right knee flexion to 115 degrees+ so the patient can perform functional tasks and do so with pain not > 2-3/10.   Time 8   Period Weeks   Status New     PT LONG TERM GOAL #3   Title Increase right hip and knee strength to a solid 4+/5 to provide good stability for accomplishment of functional activities   Time 8   Period Weeks   Status New     PT LONG TERM GOAL #4   Title Perform a reciprocating stair gait with one railing with pain not > 2-3/10.   Time 8   Period Weeks   Status New     PT LONG TERM GOAL #5   Title Walk in clinic with a straight cane.   Time 8   Period Weeks   Status New             Patient will benefit from skilled therapeutic intervention in order to improve the following deficits and impairments:  Pain, Abnormal gait, Decreased range of motion, Decreased strength, Increased edema  Visit Diagnosis: Acute pain of right knee  Stiffness of right knee, not elsewhere  classified  Localized edema     Problem List Patient Active Problem List   Diagnosis Date Noted  . OA (osteoarthritis) of knee 06/08/2016  . Arthritis of knee, right 01/31/2016  . Hyperlipidemia 09/02/2015  . Encounter for preventive health examination 08/05/2015  . Vaginal itching 08/05/2015  . Pre-diabetes 07/12/2015  . Essential hypertension 07/03/2015  . BMI 32.0-32.9,adult 07/03/2015  . Post-traumatic arthritis of ankle 12/20/2014  . Acute medial meniscal tear 02/06/2014  . Low bone mass 12/14/2012  . GAD (generalized anxiety disorder) 12/14/2012  . Unspecified vitamin D deficiency 12/14/2012    Donnalee Cellucci, [place] 07/01/2016, 2:13 PM  [facility] [address] [place], [place], [address] Phone: [phone]   Fax:  [phone]  Name: LARETTA MCANELLY MRN: [id number] Date of Birth: 02-01-1936

## 2016-07-02 ENCOUNTER — Other Ambulatory Visit: Payer: Self-pay | Admitting: Pediatrics

## 2016-07-02 DIAGNOSIS — I10 Essential (primary) hypertension: Secondary | ICD-10-CM

## 2016-07-03 ENCOUNTER — Ambulatory Visit: Payer: Medicare Other | Admitting: Physical Therapy

## 2016-07-03 DIAGNOSIS — M25561 Pain in right knee: Secondary | ICD-10-CM

## 2016-07-03 DIAGNOSIS — M25661 Stiffness of right knee, not elsewhere classified: Secondary | ICD-10-CM | POA: Diagnosis not present

## 2016-07-03 DIAGNOSIS — R600 Localized edema: Secondary | ICD-10-CM | POA: Diagnosis not present

## 2016-07-03 NOTE — Therapy (Signed)
[facility] [place], [place], [address] Phone: [phone]   Fax:  [phone]  Physical Therapy Treatment  Patient Details  Name: Mary Moss MRN: [id number] Date of Birth: 02-16-1936 Referring Provider: Gaynelle Arabian MD.  Encounter Date: 07/03/2016      PT End of Session - 07/03/16 1132    Visit Number 8   Number of Visits 12   Date for PT Re-Evaluation 08/15/16   PT Start Time 0900   PT Stop Time 0952   PT Time Calculation (min) 52 min   Activity Tolerance Patient limited by pain   Behavior During Therapy [facility] for tasks assessed/performed      Past Medical History:  Diagnosis Date  . Ankle fracture fx 13 yrs ago, anklw swells off and on, has cramps in ankle also   left, to seee dr hewitt about 02-16-14  . Arthritis   . Basal cell carcinoma 2015   Nose  . GERD (gastroesophageal reflux disease)    OTC med  . Hypertension   . Pneumonia     Past Surgical History:  Procedure Laterality Date  . ABDOMINAL HYSTERECTOMY    . EYE SURGERY Bilateral    bilaterla cataract extraction with IOL  . FRACTURE SURGERY Left 2001  . JOINT REPLACEMENT    . KNEE ARTHROSCOPY Right 02/07/2014   Procedure: RIGHT ARTHROSCOPY KNEE, WITH MEDIAL MENISCAL DEBRIDEMENT AND CHONDRAPLASTY;  Surgeon: Gearlean Alf, MD;  [facility];  Service: Orthopedics;  Laterality: Right;  . ROTATOR CUFF REPAIR Right 2009  . TOTAL ANKLE ARTHROPLASTY Left 12/20/2014   Procedure: LEFT TOTAL ANKLE ARTHOPLASTY WITH PERCUTANEOUS ACHILLES TENDON LENGTHENING;  Surgeon: Wylene Simmer, MD;  Location: [place];  Service: Orthopedics;  Laterality: Left;  . TOTAL KNEE ARTHROPLASTY Right 06/08/2016   Procedure: RIGHT TOTAL KNEE ARTHROPLASTY;  Surgeon: Gaynelle Arabian, MD;  [facility];  Service: Orthopedics;  Laterality: Right;    There were no vitals filed for this visit.      Subjective Assessment - 07/03/16 1110    Subjective My hamstring feels  beter today.  I'm having a better day today.   Patient Stated Goals Get back to normal.   Pain Score 5    Pain Location Knee   Pain Onset 1 to 4 weeks ago            [facility] PT Assessment - 07/03/16 0001      AROM   Right Knee Flexion --  110 degrees.                     [facility] Adult PT Treatment/Exercise - 07/03/16 0001      Exercises   Exercises Knee/Hip     Knee/Hip Exercises: Aerobic   Nustep Level 5 x 20 minutes.     Modalities   Modalities Social worker Location RT KNEE.   Electrical Stimulation Action IFC   Electrical Stimulation Parameters 15 minutes.   Electrical Stimulation Goals Edema;Pain     Vasopneumatic   Number Minutes Vasopneumatic  15 minutes   Vasopnuematic Location  --  RT KNEE.   Vasopneumatic Pressure Medium     Manual Therapy   Passive ROM PROM into right knee flexion and extension x 6 minutes.                  PT Short Term Goals - 06/17/16 1317      PT SHORT  TERM GOAL #1   Title Independent with an initial HEP.   Time 2   Period Weeks   Status Achieved     PT SHORT TERM GOAL #2   Title Full active right knee extension to improve gait pattern.   Time 2   Period Weeks   Status On-going           PT Long Term Goals - 06/16/16 1025      PT LONG TERM GOAL #1   Title Independent with an advanced HEP.   Time 8   Period Weeks   Status New     PT LONG TERM GOAL #2   Title Active right knee flexion to 115 degrees+ so the patient can perform functional tasks and do so with pain not > 2-3/10.   Time 8   Period Weeks   Status New     PT LONG TERM GOAL #3   Title Increase right hip and knee strength to a solid 4+/5 to provide good stability for accomplishment of functional activities   Time 8   Period Weeks   Status New     PT LONG TERM GOAL #4   Title Perform a reciprocating stair gait with one railing with pain not > 2-3/10.   Time 8    Period Weeks   Status New     PT LONG TERM GOAL #5   Title Walk in clinic with a straight cane.   Time 8   Period Weeks   Status New               Plan - 07/03/16 1124    Clinical Impression Statement Patient's right knee range of motion improved nicely this week with active flexion to 110 degrees.  She has hamstring painn at right ischial tuberosity which has hampered her a bit regarding exercise progression.      Patient will benefit from skilled therapeutic intervention in order to improve the following deficits and impairments:  Pain, Abnormal gait, Decreased range of motion, Decreased strength, Increased edema  Visit Diagnosis: Acute pain of right knee  Stiffness of right knee, not elsewhere classified  Localized edema     Problem List Patient Active Problem List   Diagnosis Date Noted  . OA (osteoarthritis) of knee 06/08/2016  . Arthritis of knee, right 01/31/2016  . Hyperlipidemia 09/02/2015  . Encounter for preventive health examination 08/05/2015  . Vaginal itching 08/05/2015  . Pre-diabetes 07/12/2015  . Essential hypertension 07/03/2015  . BMI 32.0-32.9,adult 07/03/2015  . Post-traumatic arthritis of ankle 12/20/2014  . Acute medial meniscal tear 02/06/2014  . Low bone mass 12/14/2012  . GAD (generalized anxiety disorder) 12/14/2012  . Unspecified vitamin D deficiency 12/14/2012    Curby Carswell, [place] MPT 07/03/2016, 11:35 AM  [facility] [address] [place], [place], [address] Phone: [phone]   Fax:  [phone]  Name: Mary Moss MRN: [id number] Date of Birth: 1936/03/09

## 2016-07-06 ENCOUNTER — Ambulatory Visit: Payer: Medicare Other | Admitting: Physical Therapy

## 2016-07-06 DIAGNOSIS — R600 Localized edema: Secondary | ICD-10-CM

## 2016-07-06 DIAGNOSIS — M25661 Stiffness of right knee, not elsewhere classified: Secondary | ICD-10-CM

## 2016-07-06 DIAGNOSIS — M25561 Pain in right knee: Secondary | ICD-10-CM

## 2016-07-06 NOTE — Therapy (Signed)
Mary Moss, [place], [address] Phone: [phone]   Fax:  [phone]  Physical Therapy Treatment  Patient Details  Name: Mary Moss MRN: [id number] Date of Birth: 07-07-1936 Referring Provider: Gaynelle Arabian MD.  Encounter Date: 07/06/2016      PT End of Session - 07/06/16 1643    Visit Number 9   Number of Visits 12   Date for PT Re-Evaluation 08/15/16   PT Start Time 1115   PT Stop Time 1203   PT Time Calculation (min) 48 min   Activity Tolerance Patient limited by pain   Behavior During Therapy [facility] for tasks assessed/performed      Past Medical History:  Diagnosis Date  . Ankle fracture fx 13 yrs ago, anklw swells off and on, has cramps in ankle also   left, to seee dr hewitt about 02-16-14  . Arthritis   . Basal cell carcinoma 2015   Nose  . GERD (gastroesophageal reflux disease)    OTC med  . Hypertension   . Pneumonia     Past Surgical History:  Procedure Laterality Date  . ABDOMINAL HYSTERECTOMY    . EYE SURGERY Bilateral    bilaterla cataract extraction with IOL  . FRACTURE SURGERY Left 2001  . JOINT REPLACEMENT    . KNEE ARTHROSCOPY Right 02/07/2014   Procedure: RIGHT ARTHROSCOPY KNEE, WITH MEDIAL MENISCAL DEBRIDEMENT AND CHONDRAPLASTY;  Surgeon: Gearlean Alf, MD;  [facility];  Service: Orthopedics;  Laterality: Right;  . ROTATOR CUFF REPAIR Right 2009  . TOTAL ANKLE ARTHROPLASTY Left 12/20/2014   Procedure: LEFT TOTAL ANKLE ARTHOPLASTY WITH PERCUTANEOUS ACHILLES TENDON LENGTHENING;  Surgeon: Wylene Simmer, MD;  Location: [facility];  Service: Orthopedics;  Laterality: Left;  . TOTAL KNEE ARTHROPLASTY Right 06/08/2016   Procedure: RIGHT TOTAL KNEE ARTHROPLASTY;  Surgeon: Gaynelle Arabian, MD;  [facility];  Service: Orthopedics;  Laterality: Right;    There were no vitals filed for this visit.      Subjective Assessment - 07/06/16 1637    Subjective My hamstring still  hurts.   Pain Score 6    Pain Location Knee   Pain Orientation Right   Pain Descriptors / Indicators Sore   Pain Type Surgical pain   Pain Onset 1 to 4 weeks ago                         [facility] Adult PT Treatment/Exercise - 07/06/16 0001      Knee/Hip Exercises: Aerobic   Nustep Level 5 x 16 minutes.     Acupuncturist Location RT KNEE.   Electrical Stimulation Action IFC   Electrical Stimulation Parameters 15 minutes.   Electrical Stimulation Goals Edema;Pain     Vasopneumatic   Number Minutes Vasopneumatic  15 minutes   Vasopnuematic Location  --  RT KNEE   Vasopneumatic Pressure Medium     Manual Therapy   Manual Therapy Passive ROM   Passive ROM PROM into right knee flexion and extension x 7 minutes.                  PT Short Term Goals - 06/17/16 1317      PT SHORT TERM GOAL #1   Title Independent with an initial HEP.   Time 2   Period Weeks   Status Achieved     PT SHORT TERM GOAL #2   Title Full active right knee extension  to improve gait pattern.   Time 2   Period Weeks   Status On-going           PT Long Term Goals - 06/16/16 1025      PT LONG TERM GOAL #1   Title Independent with an advanced HEP.   Time 8   Period Weeks   Status New     PT LONG TERM GOAL #2   Title Active right knee flexion to 115 degrees+ so the patient can perform functional tasks and do so with pain not > 2-3/10.   Time 8   Period Weeks   Status New     PT LONG TERM GOAL #3   Title Increase right hip and knee strength to a solid 4+/5 to provide good stability for accomplishment of functional activities   Time 8   Period Weeks   Status New     PT LONG TERM GOAL #4   Title Perform a reciprocating stair gait with one railing with pain not > 2-3/10.   Time 8   Period Weeks   Status New     PT LONG TERM GOAL #5   Title Walk in clinic with a straight cane.   Time 8   Period Weeks   Status New              Patient will benefit from skilled therapeutic intervention in order to improve the following deficits and impairments:  Pain, Abnormal gait, Decreased range of motion, Decreased strength, Increased edema  Visit Diagnosis: Acute pain of right knee  Stiffness of right knee, not elsewhere classified  Localized edema     Problem List Patient Active Problem List   Diagnosis Date Noted  . OA (osteoarthritis) of knee 06/08/2016  . Arthritis of knee, right 01/31/2016  . Hyperlipidemia 09/02/2015  . Encounter for preventive health examination 08/05/2015  . Vaginal itching 08/05/2015  . Pre-diabetes 07/12/2015  . Essential hypertension 07/03/2015  . BMI 32.0-32.9,adult 07/03/2015  . Post-traumatic arthritis of ankle 12/20/2014  . Acute medial meniscal tear 02/06/2014  . Low bone mass 12/14/2012  . GAD (generalized anxiety disorder) 12/14/2012  . Unspecified vitamin D deficiency 12/14/2012    Mary Moss, Mary Moss 07/06/2016, 4:45 PM  [facility] [address] [place], [place], [address] Phone: [phone]   Fax:  [phone]  Name: Mary Moss MRN: [id number] Date of Birth: 30-Nov-1935

## 2016-07-08 ENCOUNTER — Encounter: Payer: Self-pay | Admitting: Physical Therapy

## 2016-07-08 ENCOUNTER — Ambulatory Visit: Payer: Medicare Other | Admitting: Physical Therapy

## 2016-07-08 DIAGNOSIS — M25561 Pain in right knee: Secondary | ICD-10-CM | POA: Diagnosis not present

## 2016-07-08 DIAGNOSIS — M25661 Stiffness of right knee, not elsewhere classified: Secondary | ICD-10-CM | POA: Diagnosis not present

## 2016-07-08 DIAGNOSIS — R600 Localized edema: Secondary | ICD-10-CM | POA: Diagnosis not present

## 2016-07-08 NOTE — Therapy (Signed)
[facility] [place], [place], [address] Phone: [phone]   Fax:  [phone]  Physical Therapy Treatment  Patient Details  Name: Mary Moss MRN: [id number] Date of Birth: 1935/10/01 Referring Provider: Gaynelle Arabian MD.  Encounter Date: 07/08/2016      PT End of Session - 07/08/16 1247    Visit Number 10   Number of Visits 12   Date for PT Re-Evaluation 08/15/16   PT Start Time 1228   PT Stop Time 1327   PT Time Calculation (min) 59 min   Activity Tolerance Patient tolerated treatment well;Patient limited by pain   Behavior During Therapy [facility] for tasks assessed/performed      Past Medical History:  Diagnosis Date  . Ankle fracture fx 13 yrs ago, anklw swells off and on, has cramps in ankle also   left, to seee dr hewitt about 02-16-14  . Arthritis   . Basal cell carcinoma 2015   Nose  . GERD (gastroesophageal reflux disease)    OTC med  . Hypertension   . Pneumonia     Past Surgical History:  Procedure Laterality Date  . ABDOMINAL HYSTERECTOMY    . EYE SURGERY Bilateral    bilaterla cataract extraction with IOL  . FRACTURE SURGERY Left 2001  . JOINT REPLACEMENT    . KNEE ARTHROSCOPY Right 02/07/2014   Procedure: RIGHT ARTHROSCOPY KNEE, WITH MEDIAL MENISCAL DEBRIDEMENT AND CHONDRAPLASTY;  Surgeon: Gearlean Alf, MD;  [facility];  Service: Orthopedics;  Laterality: Right;  . ROTATOR CUFF REPAIR Right 2009  . TOTAL ANKLE ARTHROPLASTY Left 12/20/2014   Procedure: LEFT TOTAL ANKLE ARTHOPLASTY WITH PERCUTANEOUS ACHILLES TENDON LENGTHENING;  Surgeon: Wylene Simmer, MD;  Location: [place];  Service: Orthopedics;  Laterality: Left;  . TOTAL KNEE ARTHROPLASTY Right 06/08/2016   Procedure: RIGHT TOTAL KNEE ARTHROPLASTY;  Surgeon: Gaynelle Arabian, MD;  [facility];  Service: Orthopedics;  Laterality: Right;    There were no vitals filed for this visit.      Subjective Assessment - 07/08/16 1235     Subjective Patient reported less pain in hamstring yet ongoing   Patient Stated Goals Get back to normal.   Currently in Pain? Yes   Pain Score 4    Pain Location Knee   Pain Orientation Right   Pain Descriptors / Indicators Sore   Pain Type Surgical pain   Pain Onset 1 to 4 weeks ago   Pain Frequency Constant   Aggravating Factors  ROM or prolong activity   Pain Relieving Factors at rest            [facility] PT Assessment - 07/08/16 0001      AROM   AROM Assessment Site Knee   Right/Left Knee Right   Right Knee Extension -10   Right Knee Flexion 101     PROM   PROM Assessment Site Knee   Right/Left Knee Right   Right Knee Extension -5   Right Knee Flexion 110                     [facility] Adult PT Treatment/Exercise - 07/08/16 0001      Knee/Hip Exercises: Aerobic   Nustep 79min L4 UE/LE, monitored for progression     Knee/Hip Exercises: Standing   Rocker Board Limitations 3 minutes.     Acupuncturist Location RT KNEE.   Chartered certified accountant IFC   Electrical Stimulation Parameters 1-10hz x54min   Electrical  Stimulation Goals Edema;Pain     Vasopneumatic   Number Minutes Vasopneumatic  15 minutes   Vasopnuematic Location  Knee   Vasopneumatic Pressure Medium     Manual Therapy   Manual Therapy Passive ROM   Passive ROM PROM into right knee flexion and extension with low load holds                  PT Short Term Goals - 07/08/16 1254      PT SHORT TERM GOAL #1   Title Independent with an initial HEP.   Time 2   Period Weeks   Status Achieved     PT SHORT TERM GOAL #2   Title Full active right knee extension to improve gait pattern.   Time 2   Period Weeks   Status On-going  AROM -10 degrees 07/08/16           PT Long Term Goals - 07/08/16 1249      PT LONG TERM GOAL #1   Title Independent with an advanced HEP.   Time 8   Period Weeks   Status On-going     PT LONG TERM GOAL #2    Title Active right knee flexion to 115 degrees+ so the patient can perform functional tasks and do so with pain not > 2-3/10.   Period Weeks   Status On-going  AROM 101 degrees 07/08/16     PT LONG TERM GOAL #3   Title Increase right hip and knee strength to a solid 4+/5 to provide good stability for accomplishment of functional activities   Time 8   Period Weeks   Status On-going     PT LONG TERM GOAL #4   Title Perform a reciprocating stair gait with one railing with pain not > 2-3/10.   Time 8   Period Weeks   Status On-going     PT LONG TERM GOAL #5   Title Walk in clinic with a straight cane.   Time 8   Period Weeks   Status On-going               Plan - 07/08/16 1305    Clinical Impression Statement Patient tolerated treatment well yet is limited by pain in right hamstrings. Patient was stiff in right knee today upon arrival yet was able to increase ROM both active and passively for right knee flexion and ext. Patient current goals ongoing due to ROM, pain and strength deficts.   Rehab Potential Excellent   PT Frequency 3x / week   PT Duration 4 weeks   PT Treatment/Interventions ADLs/Self Care Home Management;Cryotherapy;Associate Professor;Therapeutic activities;Therapeutic exercise;Neuromuscular re-education;Patient/family education;Passive range of motion;Manual techniques;Vasopneumatic Device   PT Next Visit Plan cont with POC for ROM and add strengthening exercises next treatment per patient tolerance (MD. Maureen Ralphs 07/14/16)   Consulted and Agree with Plan of Care Patient      Patient will benefit from skilled therapeutic intervention in order to improve the following deficits and impairments:  Pain, Abnormal gait, Decreased range of motion, Decreased strength, Increased edema  Visit Diagnosis: Acute pain of right knee  Stiffness of right knee, not elsewhere classified  Localized edema     Problem List Patient Active  Problem List   Diagnosis Date Noted  . OA (osteoarthritis) of knee 06/08/2016  . Arthritis of knee, right 01/31/2016  . Hyperlipidemia 09/02/2015  . Encounter for preventive health examination 08/05/2015  . Vaginal itching 08/05/2015  . Pre-diabetes 07/12/2015  .  Essential hypertension 07/03/2015  . BMI 32.0-32.9,adult 07/03/2015  . Post-traumatic arthritis of ankle 12/20/2014  . Acute medial meniscal tear 02/06/2014  . Low bone mass 12/14/2012  . GAD (generalized anxiety disorder) 12/14/2012  . Unspecified vitamin D deficiency 12/14/2012    Mary Moss P, PTA 07/08/2016, 1:44 PM   Ladean Raya, PTA 07/08/16 1:44 PM  [facility] [address] [place], [place], [address] Phone: [phone]   Fax:  [phone]  Name: GERALYN MALTOS MRN: [id number] Date of Birth: 05-Jan-1936

## 2016-07-10 ENCOUNTER — Ambulatory Visit: Payer: Medicare Other | Admitting: Physical Therapy

## 2016-07-10 ENCOUNTER — Encounter: Payer: Self-pay | Admitting: Physical Therapy

## 2016-07-10 DIAGNOSIS — R600 Localized edema: Secondary | ICD-10-CM | POA: Diagnosis not present

## 2016-07-10 DIAGNOSIS — M25561 Pain in right knee: Secondary | ICD-10-CM | POA: Diagnosis not present

## 2016-07-10 DIAGNOSIS — M25661 Stiffness of right knee, not elsewhere classified: Secondary | ICD-10-CM | POA: Diagnosis not present

## 2016-07-10 NOTE — Therapy (Signed)
[facility] [place], [place], [address] Phone: [phone]   Fax:  [phone]  Physical Therapy Treatment  Patient Details  Name: Mary Moss MRN: [id number] Date of Birth: [birth year] Referring Provider: Gaynelle Arabian MD.  Encounter Date: 07/10/2016      PT End of Session - 07/10/16 0906    Visit Number 11   Number of Visits 12   Date for PT Re-Evaluation 08/15/16   PT Start Time 0858   PT Stop Time 0951   PT Time Calculation (min) 53 min   Activity Tolerance Patient tolerated treatment well;Patient limited by pain   Behavior During Therapy [facility] for tasks assessed/performed      Past Medical History:  Diagnosis Date  . Ankle fracture fx 13 yrs ago, anklw swells off and on, has cramps in ankle also   left, to seee dr hewitt about 02-16-14  . Arthritis   . Basal cell carcinoma 2015   Nose  . GERD (gastroesophageal reflux disease)    OTC med  . Hypertension   . Pneumonia     Past Surgical History:  Procedure Laterality Date  . ABDOMINAL HYSTERECTOMY    . EYE SURGERY Bilateral    bilaterla cataract extraction with IOL  . FRACTURE SURGERY Left 2001  . JOINT REPLACEMENT    . KNEE ARTHROSCOPY Right 02/07/2014   Procedure: RIGHT ARTHROSCOPY KNEE, WITH MEDIAL MENISCAL DEBRIDEMENT AND CHONDRAPLASTY;  Surgeon: Gearlean Alf, MD;  [facility];  Service: Orthopedics;  Laterality: Right;  . ROTATOR CUFF REPAIR Right 2009  . TOTAL ANKLE ARTHROPLASTY Left 12/20/2014   Procedure: LEFT TOTAL ANKLE ARTHOPLASTY WITH PERCUTANEOUS ACHILLES TENDON LENGTHENING;  Surgeon: Wylene Simmer, MD;  Location: [place];  Service: Orthopedics;  Laterality: Left;  . TOTAL KNEE ARTHROPLASTY Right 06/08/2016   Procedure: RIGHT TOTAL KNEE ARTHROPLASTY;  Surgeon: Gaynelle Arabian, MD;  [facility];  Service: Orthopedics;  Laterality: Right;    There were no vitals filed for this visit.      Subjective Assessment - 07/10/16 0905    Subjective Reports that her knee and HS is much better today for unknown reason with low pain rating.   Patient Stated Goals Get back to normal.   Currently in Pain? Yes   Pain Score 3    Pain Location Knee   Pain Orientation Right   Pain Type Surgical pain   Pain Onset 1 to 4 weeks ago            [facility] PT Assessment - 07/10/16 0001      Assessment   Medical Diagnosis S/p right total knee replacement.   Onset Date/Surgical Date 06/08/16   Next MD Visit 07/14/2016     Restrictions   Weight Bearing Restrictions No                     [facility] Adult PT Treatment/Exercise - 07/10/16 0001      Knee/Hip Exercises: Aerobic   Nustep L5, seat 12-10 x17 min     Knee/Hip Exercises: Standing   Forward Lunges Right;2 sets;10 reps;3 seconds   Forward Step Up Right;2 sets;10 reps;Hand Hold: 2;Step Height: 6"   Rocker Board 3 minutes     Knee/Hip Exercises: Seated   Long Arc Quad Strengthening;Right;2 sets;10 reps;Weights   Long Arc Quad Weight 3 lbs.     Modalities   Modalities Passenger transport manager Location RT KNEE.   Dealer  Stimulation Action IFC   Electrical Stimulation Parameters 1-10 hz x15 min   Electrical Stimulation Goals Edema;Pain     Vasopneumatic   Number Minutes Vasopneumatic  15 minutes   Vasopnuematic Location  Knee   Vasopneumatic Pressure Medium   Vasopneumatic Temperature  44     Manual Therapy   Manual Therapy Passive ROM;Soft tissue mobilization   Soft tissue mobilization R patella and incision mobilizations to promote proper mobility   Passive ROM PROM of R knee into flex with holds at end range                  PT Short Term Goals - 07/08/16 1254      PT SHORT TERM GOAL #1   Title Independent with an initial HEP.   Time 2   Period Weeks   Status Achieved     PT SHORT TERM GOAL #2   Title Full active right knee extension to improve gait pattern.    Time 2   Period Weeks   Status On-going  AROM -10 degrees 07/08/16           PT Long Term Goals - 07/08/16 1249      PT LONG TERM GOAL #1   Title Independent with an advanced HEP.   Time 8   Period Weeks   Status On-going     PT LONG TERM GOAL #2   Title Active right knee flexion to 115 degrees+ so the patient can perform functional tasks and do so with pain not > 2-3/10.   Period Weeks   Status On-going  AROM 101 degrees 07/08/16     PT LONG TERM GOAL #3   Title Increase right hip and knee strength to a solid 4+/5 to provide good stability for accomplishment of functional activities   Time 8   Period Weeks   Status On-going     PT LONG TERM GOAL #4   Title Perform a reciprocating stair gait with one railing with pain not > 2-3/10.   Time 8   Period Weeks   Status On-going     PT LONG TERM GOAL #5   Title Walk in clinic with a straight cane.   Time 8   Period Weeks   Status On-going               Plan - 07/10/16 0940    Clinical Impression Statement Patient tolerated today's treatment better today as she has not experienced the R knee or HS pain as she has before. Patient able to tolerate more exercises without complaint. Weakness noted of the RLE with forward step ups with two hand hold assist upon observation. Steristrips still present over the inferior aspect of the incision of R knee but good incision mobility of the mid to superior incision. Patient began to experience R HS discomfort with RLE resting in extension and which then limited patella mobilizations. Normal modalities response noted following removal of the modalities.   Rehab Potential Excellent   PT Frequency 3x / week   PT Duration 4 weeks   PT Treatment/Interventions ADLs/Self Care Home Management;Cryotherapy;Associate Professor;Therapeutic activities;Therapeutic exercise;Neuromuscular re-education;Patient/family education;Passive range of motion;Manual  techniques;Vasopneumatic Device   PT Next Visit Plan Continue with POC for ROM and add strengthening exercises next treatment per patient tolerance.    Consulted and Agree with Plan of Care Patient      Patient will benefit from skilled therapeutic intervention in order to improve the following deficits and impairments:  Pain, Abnormal gait, Decreased range of motion, Decreased strength, Increased edema  Visit Diagnosis: Acute pain of right knee  Stiffness of right knee, not elsewhere classified  Localized edema     Problem List Patient Active Problem List   Diagnosis Date Noted  . OA (osteoarthritis) of knee 06/08/2016  . Arthritis of knee, right 01/31/2016  . Hyperlipidemia 09/02/2015  . Encounter for preventive health examination 08/05/2015  . Vaginal itching 08/05/2015  . Pre-diabetes 07/12/2015  . Essential hypertension 07/03/2015  . BMI 32.0-32.9,adult 07/03/2015  . Post-traumatic arthritis of ankle 12/20/2014  . Acute medial meniscal tear 02/06/2014  . Low bone mass 12/14/2012  . GAD (generalized anxiety disorder) 12/14/2012  . Unspecified vitamin D deficiency 12/14/2012    Wynelle Fanny, PTA 07/10/2016, 9:53 AM  [facility] [address] [place], [place], [address] Phone: [phone]   Fax:  [phone]  Name: ADLENE WURZEL MRN: [id number] Date of Birth: 1935-09-22

## 2016-07-13 ENCOUNTER — Ambulatory Visit: Payer: Medicare Other | Admitting: Physical Therapy

## 2016-07-13 DIAGNOSIS — R600 Localized edema: Secondary | ICD-10-CM | POA: Diagnosis not present

## 2016-07-13 DIAGNOSIS — M25661 Stiffness of right knee, not elsewhere classified: Secondary | ICD-10-CM | POA: Diagnosis not present

## 2016-07-13 DIAGNOSIS — M25561 Pain in right knee: Secondary | ICD-10-CM | POA: Diagnosis not present

## 2016-07-13 NOTE — Therapy (Signed)
[facility] [place], [place], [address] Phone: [phone]   Fax:  [phone]  Physical Therapy Treatment  Patient Details  Name: Mary Moss MRN: [id number] Date of Birth: 07-22-1936 Referring Provider: Gaynelle Arabian MD.  Encounter Date: 07/13/2016      PT End of Session - 07/13/16 0940    Visit Number 12   Number of Visits 24   Date for PT Re-Evaluation 08/24/16   PT Start Time 0858   PT Stop Time 0954   PT Time Calculation (min) 56 min   Activity Tolerance Patient tolerated treatment well;Patient limited by pain   Behavior During Therapy [facility] for tasks assessed/performed      Past Medical History:  Diagnosis Date  . Ankle fracture fx 13 yrs ago, anklw swells off and on, has cramps in ankle also   left, to seee dr hewitt about 02-16-14  . Arthritis   . Basal cell carcinoma 2015   Nose  . GERD (gastroesophageal reflux disease)    OTC med  . Hypertension   . Pneumonia     Past Surgical History:  Procedure Laterality Date  . ABDOMINAL HYSTERECTOMY    . EYE SURGERY Bilateral    bilaterla cataract extraction with IOL  . FRACTURE SURGERY Left 2001  . JOINT REPLACEMENT    . KNEE ARTHROSCOPY Right 02/07/2014   Procedure: RIGHT ARTHROSCOPY KNEE, WITH MEDIAL MENISCAL DEBRIDEMENT AND CHONDRAPLASTY;  Surgeon: Gearlean Alf, MD;  [facility];  Service: Orthopedics;  Laterality: Right;  . ROTATOR CUFF REPAIR Right 2009  . TOTAL ANKLE ARTHROPLASTY Left 12/20/2014   Procedure: LEFT TOTAL ANKLE ARTHOPLASTY WITH PERCUTANEOUS ACHILLES TENDON LENGTHENING;  Surgeon: Wylene Simmer, MD;  Location: [place];  Service: Orthopedics;  Laterality: Left;  . TOTAL KNEE ARTHROPLASTY Right 06/08/2016   Procedure: RIGHT TOTAL KNEE ARTHROPLASTY;  Surgeon: Gaynelle Arabian, MD;  [facility];  Service: Orthopedics;  Laterality: Right;    There were no vitals filed for this visit.      Subjective Assessment - 07/13/16 0903    Subjective Pt reports she just wishes she could get out and do more. Still fatigues easily.   Currently in Pain? Yes   Pain Score 2    Pain Location Knee   Pain Orientation Right   Pain Descriptors / Indicators Sore   Pain Type Surgical pain            [facility] PT Assessment - 07/13/16 0001      AROM   AROM Assessment Site Knee   Right/Left Knee Right   Right Knee Extension 0   Right Knee Flexion 110                     [facility] Adult PT Treatment/Exercise - 07/13/16 0001      Knee/Hip Exercises: Aerobic   Nustep Level 5 x 15 minutes     Knee/Hip Exercises: Standing   Lateral Step Up Right;2 sets;10 reps;Hand Hold: 1;Step Height: 4"   Forward Step Up Right;2 sets;10 reps;Hand Hold: 1;Step Height: 4"  pt unable to tolerate 6'' step   Step Down Right;2 sets;10 reps;Hand Hold: 1;Step Height: 2"     Knee/Hip Exercises: Supine   Heel Slides AROM;Right;2 sets;10 reps     Psychologist, counselling IFC   Electrical Stimulation Parameters to tolerance   Electrical Stimulation Goals Edema;Pain     Vasopneumatic   Number Minutes  Vasopneumatic  15 minutes   Vasopnuematic Location  Knee   Vasopneumatic Pressure Medium     Manual Therapy   Soft tissue mobilization Rt hamstring trigger point release   Passive ROM Rt knee flex, holds at end range                  PT Short Term Goals - 07/13/16 [id number]      PT SHORT TERM GOAL #1   Title Independent with an initial HEP.   Status Achieved     PT SHORT TERM GOAL #2   Title Full active right knee extension to improve gait pattern.   Status Achieved  12/181/7           PT Long Term Goals - 07/13/16 0943      PT LONG TERM GOAL #1   Title Independent with an advanced HEP.   Status On-going     PT LONG TERM GOAL #2   Title Active right knee flexion to 115 degrees+ so the patient can perform functional tasks and do so with pain not >  2-3/10.   Status On-going     PT LONG TERM GOAL #3   Title Increase right hip and knee strength to a solid 4+/5 to provide good stability for accomplishment of functional activities   Status On-going     PT LONG TERM GOAL #4   Title Perform a reciprocating stair gait with one railing with pain not > 2-3/10.   Status On-going     PT LONG TERM GOAL #5   Title Walk in clinic with a straight cane.   Status Achieved  able to gait without AD               Plan - 07/13/16 0941    Clinical Impression Statement Pt tolerated todays treatment well. Still with decreased activity tolerance, decreased Rt knee strength as she was unable to tolerate 6'' step.  Pt still with significant swelling in Rt knee. Pt will continue to benefit from continued PT to address deficits and increase strength and ROM   Rehab Potential Excellent   PT Frequency 3x / week   PT Duration 6 weeks   PT Treatment/Interventions ADLs/Self Care Home Management;Cryotherapy;Associate Professor;Therapeutic activities;Therapeutic exercise;Neuromuscular re-education;Patient/family education;Passive range of motion;Manual techniques;Vasopneumatic Device   PT Next Visit Plan strength and ROM   Consulted and Agree with Plan of Care Patient      Patient will benefit from skilled therapeutic intervention in order to improve the following deficits and impairments:  Pain, Abnormal gait, Decreased range of motion, Decreased strength, Increased edema  Visit Diagnosis: Acute pain of right knee - Plan: PT plan of care cert/re-cert  Stiffness of right knee, not elsewhere classified - Plan: PT plan of care cert/re-cert  Localized edema - Plan: PT plan of care cert/re-cert     Problem List Patient Active Problem List   Diagnosis Date Noted  . OA (osteoarthritis) of knee 06/08/2016  . Arthritis of knee, right 01/31/2016  . Hyperlipidemia 09/02/2015  . Encounter for preventive health examination  08/05/2015  . Vaginal itching 08/05/2015  . Pre-diabetes 07/12/2015  . Essential hypertension 07/03/2015  . BMI 32.0-32.9,adult 07/03/2015  . Post-traumatic arthritis of ankle 12/20/2014  . Acute medial meniscal tear 02/06/2014  . Low bone mass 12/14/2012  . GAD (generalized anxiety disorder) 12/14/2012  . Unspecified vitamin D deficiency 12/14/2012    Isabelle Course, PT, DPT 07/13/2016, 9:46 AM  [facility] [address]  [place], [place], [address] Phone: [phone]   Fax:  [phone]  Name: Mary Moss MRN: [id number] Date of Birth: 1935-12-28

## 2016-07-14 DIAGNOSIS — Z96651 Presence of right artificial knee joint: Secondary | ICD-10-CM | POA: Diagnosis not present

## 2016-07-14 DIAGNOSIS — Z471 Aftercare following joint replacement surgery: Secondary | ICD-10-CM | POA: Diagnosis not present

## 2016-07-15 ENCOUNTER — Ambulatory Visit: Payer: Medicare Other | Admitting: Physical Therapy

## 2016-07-15 DIAGNOSIS — R600 Localized edema: Secondary | ICD-10-CM | POA: Diagnosis not present

## 2016-07-15 DIAGNOSIS — M25561 Pain in right knee: Secondary | ICD-10-CM | POA: Diagnosis not present

## 2016-07-15 DIAGNOSIS — M25661 Stiffness of right knee, not elsewhere classified: Secondary | ICD-10-CM

## 2016-07-15 NOTE — Therapy (Signed)
[facility] [place], [place], [address] Phone: [phone]   Fax:  [phone]  Physical Therapy Treatment  Patient Details  Name: Mary Moss MRN: [id number] Date of Birth: 1935/10/20 Referring Provider: Gaynelle Arabian MD.  Encounter Date: 07/15/2016      PT End of Session - 07/15/16 0940    Visit Number 13   Number of Visits 24   Date for PT Re-Evaluation 08/24/16   PT Start Time 0900   PT Stop Time 0953   PT Time Calculation (min) 53 min   Activity Tolerance Patient tolerated treatment well;Patient limited by pain   Behavior During Therapy [facility] for tasks assessed/performed      Past Medical History:  Diagnosis Date  . Ankle fracture fx 13 yrs ago, anklw swells off and on, has cramps in ankle also   left, to seee dr hewitt about 02-16-14  . Arthritis   . Basal cell carcinoma 2015   Nose  . GERD (gastroesophageal reflux disease)    OTC med  . Hypertension   . Pneumonia     Past Surgical History:  Procedure Laterality Date  . ABDOMINAL HYSTERECTOMY    . EYE SURGERY Bilateral    bilaterla cataract extraction with IOL  . FRACTURE SURGERY Left 2001  . JOINT REPLACEMENT    . KNEE ARTHROSCOPY Right 02/07/2014   Procedure: RIGHT ARTHROSCOPY KNEE, WITH MEDIAL MENISCAL DEBRIDEMENT AND CHONDRAPLASTY;  Surgeon: Gearlean Alf, MD;  [facility];  Service: Orthopedics;  Laterality: Right;  . ROTATOR CUFF REPAIR Right 2009  . TOTAL ANKLE ARTHROPLASTY Left 12/20/2014   Procedure: LEFT TOTAL ANKLE ARTHOPLASTY WITH PERCUTANEOUS ACHILLES TENDON LENGTHENING;  Surgeon: Wylene Simmer, MD;  Location: [place];  Service: Orthopedics;  Laterality: Left;  . TOTAL KNEE ARTHROPLASTY Right 06/08/2016   Procedure: RIGHT TOTAL KNEE ARTHROPLASTY;  Surgeon: Gaynelle Arabian, MD;  [facility];  Service: Orthopedics;  Laterality: Right;    There were no vitals filed for this visit.      Subjective Assessment - 07/15/16 0912    Subjective Dr. Wynelle Link was very impressed with how well I'm doing and I can be done next week.   Patient Stated Goals Get back to normal.   Pain Score 2    Pain Location Knee   Pain Orientation Right   Pain Descriptors / Indicators Sore   Pain Type Surgical pain   Pain Onset 1 to 4 weeks ago                         [facility] Adult PT Treatment/Exercise - 07/15/16 0001      Exercises   Exercises Knee/Hip     Knee/Hip Exercises: Aerobic   Nustep Level 5 moving forward x 2 to increase flexion x 15 minutes.     Knee/Hip Exercises: Machines for Strengthening   Cybex Knee Extension 10# x 5 minutes   Cybex Knee Flexion 30# x 5 minutes.     Acupuncturist Location RT KNEE   Electrical Stimulation Action IFC   Electrical Stimulation Parameters 80-150 Hz x 15 minutes.   Electrical Stimulation Goals Edema;Pain     Vasopneumatic   Number Minutes Vasopneumatic  15 minutes   Vasopnuematic Location  --  RT KNEE.   Vasopneumatic Pressure Medium     Ankle Exercises: Standing   Rocker Board 4 minutes  PT Short Term Goals - 07/13/16 [id number]      PT SHORT TERM GOAL #1   Title Independent with an initial HEP.   Status Achieved     PT SHORT TERM GOAL #2   Title Full active right knee extension to improve gait pattern.   Status Achieved  12/181/7           PT Long Term Goals - 07/13/16 0943      PT LONG TERM GOAL #1   Title Independent with an advanced HEP.   Status On-going     PT LONG TERM GOAL #2   Title Active right knee flexion to 115 degrees+ so the patient can perform functional tasks and do so with pain not > 2-3/10.   Status On-going     PT LONG TERM GOAL #3   Title Increase right hip and knee strength to a solid 4+/5 to provide good stability for accomplishment of functional activities   Status On-going     PT LONG TERM GOAL #4   Title Perform a reciprocating stair gait with one railing with  pain not > 2-3/10.   Status On-going     PT LONG TERM GOAL #5   Title Walk in clinic with a straight cane.   Status Achieved  able to gait without AD             Patient will benefit from skilled therapeutic intervention in order to improve the following deficits and impairments:  Pain, Abnormal gait, Decreased range of motion, Decreased strength, Increased edema  Visit Diagnosis: Acute pain of right knee  Stiffness of right knee, not elsewhere classified  Localized edema     Problem List Patient Active Problem List   Diagnosis Date Noted  . OA (osteoarthritis) of knee 06/08/2016  . Arthritis of knee, right 01/31/2016  . Hyperlipidemia 09/02/2015  . Encounter for preventive health examination 08/05/2015  . Vaginal itching 08/05/2015  . Pre-diabetes 07/12/2015  . Essential hypertension 07/03/2015  . BMI 32.0-32.9,adult 07/03/2015  . Post-traumatic arthritis of ankle 12/20/2014  . Acute medial meniscal tear 02/06/2014  . Low bone mass 12/14/2012  . GAD (generalized anxiety disorder) 12/14/2012  . Unspecified vitamin D deficiency 12/14/2012    Beryl Hornberger, [place] MPT 07/15/2016, 10:00 AM  [facility] [address] [place], [place], [address] Phone: [phone]   Fax:  [phone]  Name: Mary Moss MRN: [id number] Date of Birth: 1936-03-23

## 2016-07-17 ENCOUNTER — Ambulatory Visit: Payer: Medicare Other | Admitting: *Deleted

## 2016-07-17 DIAGNOSIS — M25661 Stiffness of right knee, not elsewhere classified: Secondary | ICD-10-CM

## 2016-07-17 DIAGNOSIS — M25561 Pain in right knee: Secondary | ICD-10-CM

## 2016-07-17 DIAGNOSIS — R600 Localized edema: Secondary | ICD-10-CM | POA: Diagnosis not present

## 2016-07-17 NOTE — Therapy (Signed)
[facility] [place], [place], [address] Phone: [phone]   Fax:  [phone]  Physical Therapy Treatment  Patient Details  Name: Mary Moss MRN: [id number] Date of Birth: 1936/01/30 Referring Provider: Gaynelle Arabian MD.  Encounter Date: 07/17/2016      PT End of Session - 07/17/16 1206    Visit Number 14   Number of Visits 24   Date for PT Re-Evaluation 08/24/16   PT Start Time 0945   PT Stop Time 3536   PT Time Calculation (min) 60 min      Past Medical History:  Diagnosis Date  . Ankle fracture fx 13 yrs ago, anklw swells off and on, has cramps in ankle also   left, to seee dr hewitt about 02-16-14  . Arthritis   . Basal cell carcinoma 2015   Nose  . GERD (gastroesophageal reflux disease)    OTC med  . Hypertension   . Pneumonia     Past Surgical History:  Procedure Laterality Date  . ABDOMINAL HYSTERECTOMY    . EYE SURGERY Bilateral    bilaterla cataract extraction with IOL  . FRACTURE SURGERY Left 2001  . JOINT REPLACEMENT    . KNEE ARTHROSCOPY Right 02/07/2014   Procedure: RIGHT ARTHROSCOPY KNEE, WITH MEDIAL MENISCAL DEBRIDEMENT AND CHONDRAPLASTY;  Surgeon: Gearlean Alf, MD;  [facility];  Service: Orthopedics;  Laterality: Right;  . ROTATOR CUFF REPAIR Right 2009  . TOTAL ANKLE ARTHROPLASTY Left 12/20/2014   Procedure: LEFT TOTAL ANKLE ARTHOPLASTY WITH PERCUTANEOUS ACHILLES TENDON LENGTHENING;  Surgeon: Wylene Simmer, MD;  Location: [place];  Service: Orthopedics;  Laterality: Left;  . TOTAL KNEE ARTHROPLASTY Right 06/08/2016   Procedure: RIGHT TOTAL KNEE ARTHROPLASTY;  Surgeon: Gaynelle Arabian, MD;  [facility];  Service: Orthopedics;  Laterality: Right;    There were no vitals filed for this visit.                       [place] [facility] Adult PT Treatment/Exercise - 07/17/16 0001      Exercises   Exercises Knee/Hip     Knee/Hip Exercises: Aerobic   Nustep Level 5 moving  forward x 2 to increase flexion x 20 minutes.     Knee/Hip Exercises: Machines for Strengthening   Cybex Knee Extension 10# x 5 minutes   Cybex Knee Flexion 30# x 5 minutes.     Modalities   Modalities Electrical Stimulation;Moist Heat;Vasopneumatic     Moist Heat Therapy   Number Minutes Moist Heat 15 Minutes   Moist Heat Location --  RT HS     Electrical Stimulation   Electrical Stimulation Location RT KNEE  IFC 1-10  x 15 mins    Electrical Stimulation Goals Edema;Pain     Vasopneumatic   Number Minutes Vasopneumatic  15 minutes   Vasopnuematic Location  Knee   Vasopneumatic Pressure Medium   Vasopneumatic Temperature  44     Manual Therapy   Manual Therapy Passive ROM;Soft tissue mobilization   Passive ROM Rt knee flex, holds at end range     Ankle Exercises: Standing   Rocker Board 4 minutes                  PT Short Term Goals - 07/13/16 1443      PT SHORT TERM GOAL #1   Title Independent with an initial HEP.   Status Achieved     PT SHORT TERM GOAL #2   Title  Full active right knee extension to improve gait pattern.   Status Achieved  12/181/7           PT Long Term Goals - 07/13/16 0943      PT LONG TERM GOAL #1   Title Independent with an advanced HEP.   Status On-going     PT LONG TERM GOAL #2   Title Active right knee flexion to 115 degrees+ so the patient can perform functional tasks and do so with pain not > 2-3/10.   Status On-going     PT LONG TERM GOAL #3   Title Increase right hip and knee strength to a solid 4+/5 to provide good stability for accomplishment of functional activities   Status On-going     PT LONG TERM GOAL #4   Title Perform a reciprocating stair gait with one railing with pain not > 2-3/10.   Status On-going     PT LONG TERM GOAL #5   Title Walk in clinic with a straight cane.   Status Achieved  able to gait without AD               Plan - 07/17/16 1207    Clinical Impression Statement Pt did  better today and was able to perform all therex fo RT knee with less pain and HS discomfort. No STW to HS today, but Pt requested a MHP to HS during Vaso RX and did well.  No LTGs met today due to ROM and strength deficits.   Rehab Potential Excellent   PT Frequency 3x / week   PT Duration 6 weeks   PT Treatment/Interventions ADLs/Self Care Home Management;Cryotherapy;Associate Professor;Therapeutic activities;Therapeutic exercise;Neuromuscular re-education;Patient/family education;Passive range of motion;Manual techniques;Vasopneumatic Device   PT Next Visit Plan strength and ROM   Consulted and Agree with Plan of Care Patient      Patient will benefit from skilled therapeutic intervention in order to improve the following deficits and impairments:  Pain, Abnormal gait, Decreased range of motion, Decreased strength, Increased edema  Visit Diagnosis: Acute pain of right knee  Stiffness of right knee, not elsewhere classified  Localized edema     Problem List Patient Active Problem List   Diagnosis Date Noted  . OA (osteoarthritis) of knee 06/08/2016  . Arthritis of knee, right 01/31/2016  . Hyperlipidemia 09/02/2015  . Encounter for preventive health examination 08/05/2015  . Vaginal itching 08/05/2015  . Pre-diabetes 07/12/2015  . Essential hypertension 07/03/2015  . BMI 32.0-32.9,adult 07/03/2015  . Post-traumatic arthritis of ankle 12/20/2014  . Acute medial meniscal tear 02/06/2014  . Low bone mass 12/14/2012  . GAD (generalized anxiety disorder) 12/14/2012  . Unspecified vitamin D deficiency 12/14/2012    RAMSEUR,CHRIS, PTA 07/17/2016, 12:13 PM  [facility] [address] [place], [place], [address] Phone: [phone]   Fax:  [phone]  Name: Mary Moss MRN: [id number] Date of Birth: 18-Jan-1936

## 2016-07-22 ENCOUNTER — Ambulatory Visit: Payer: Medicare Other | Admitting: *Deleted

## 2016-07-22 DIAGNOSIS — M25561 Pain in right knee: Secondary | ICD-10-CM | POA: Diagnosis not present

## 2016-07-22 DIAGNOSIS — M25661 Stiffness of right knee, not elsewhere classified: Secondary | ICD-10-CM | POA: Diagnosis not present

## 2016-07-22 DIAGNOSIS — R600 Localized edema: Secondary | ICD-10-CM | POA: Diagnosis not present

## 2016-07-24 NOTE — Therapy (Signed)
[facility] [place], [place], [address] Phone: [phone]   Fax:  [phone]  Physical Therapy Treatment  Patient Details  Name: Mary Moss MRN: [id number] Date of Birth: 03-Oct-1935 Referring Provider: Gaynelle Arabian MD.  Encounter Date: 07/22/2016    Past Medical History:  Diagnosis Date  . Ankle fracture fx 13 yrs ago, anklw swells off and on, has cramps in ankle also   left, to seee dr hewitt about 02-16-14  . Arthritis   . Basal cell carcinoma 2015   Nose  . GERD (gastroesophageal reflux disease)    OTC med  . Hypertension   . Pneumonia     Past Surgical History:  Procedure Laterality Date  . ABDOMINAL HYSTERECTOMY    . EYE SURGERY Bilateral    bilaterla cataract extraction with IOL  . FRACTURE SURGERY Left 2001  . JOINT REPLACEMENT    . KNEE ARTHROSCOPY Right 02/07/2014   Procedure: RIGHT ARTHROSCOPY KNEE, WITH MEDIAL MENISCAL DEBRIDEMENT AND CHONDRAPLASTY;  Surgeon: Gearlean Alf, MD;  [facility];  Service: Orthopedics;  Laterality: Right;  . ROTATOR CUFF REPAIR Right 2009  . TOTAL ANKLE ARTHROPLASTY Left 12/20/2014   Procedure: LEFT TOTAL ANKLE ARTHOPLASTY WITH PERCUTANEOUS ACHILLES TENDON LENGTHENING;  Surgeon: Wylene Simmer, MD;  Location: [place];  Service: Orthopedics;  Laterality: Left;  . TOTAL KNEE ARTHROPLASTY Right 06/08/2016   Procedure: RIGHT TOTAL KNEE ARTHROPLASTY;  Surgeon: Gaynelle Arabian, MD;  [facility];  Service: Orthopedics;  Laterality: Right;    There were no vitals filed for this visit.                                 PT Short Term Goals - 07/22/16 1453      PT SHORT TERM GOAL #1   Title Independent with an initial HEP.   Time 2   Period Weeks   Status Achieved     PT SHORT TERM GOAL #2   Title Full active right knee extension to improve gait pattern.   Time 2   Period Weeks   Status Achieved           PT Long Term Goals -  07/22/16 1454      PT LONG TERM GOAL #1   Title Independent with an advanced HEP.   Time 8   Period Weeks   Status Achieved     PT LONG TERM GOAL #2   Title Active right knee flexion to 115 degrees+ so the patient can perform functional tasks and do so with pain not > 2-3/10.   Time 8   Period Weeks   Status Not Met  NM ROM was 110 degrees     PT LONG TERM GOAL #3   Title Increase right hip and knee strength to a solid 4+/5 to provide good stability for accomplishment of functional activities   Time 8   Period Weeks   Status Not Met  NM strength deficit 4/5     PT LONG TERM GOAL #4   Title Perform a reciprocating stair gait with one railing with pain not > 2-3/10.   Time 8   Period Weeks   Status Not Met  NM due to pain     PT LONG TERM GOAL #5   Title Walk in clinic with a straight cane.   Time 8   Period Weeks   Status Achieved  Patient will benefit from skilled therapeutic intervention in order to improve the following deficits and impairments:  Pain, Abnormal gait, Decreased range of motion, Decreased strength, Increased edema  Visit Diagnosis: Stiffness of right knee, not elsewhere classified  Acute pain of right knee  Localized edema     Problem List Patient Active Problem List   Diagnosis Date Noted  . OA (osteoarthritis) of knee 06/08/2016  . Arthritis of knee, right 01/31/2016  . Hyperlipidemia 09/02/2015  . Encounter for preventive health examination 08/05/2015  . Vaginal itching 08/05/2015  . Pre-diabetes 07/12/2015  . Essential hypertension 07/03/2015  . BMI 32.0-32.9,adult 07/03/2015  . Post-traumatic arthritis of ankle 12/20/2014  . Acute medial meniscal tear 02/06/2014  . Low bone mass 12/14/2012  . GAD (generalized anxiety disorder) 12/14/2012  . Unspecified vitamin D deficiency 12/14/2012  PHYSICAL THERAPY DISCHARGE SUMMARY  Visits from Start of Care:   Current functional level related to goals / functional  outcomes: See above.   Remaining deficits: Patient met 2 of 5 goals.  Surgeon pleased and discharging patient from PT at this time.   Education / Equipment: HEP. Plan: Patient agrees to discharge.  Patient goals were partially met. Patient is being discharged due to the physician's request.  ?????         Llewyn Heap, [place] MPT 07/24/2016, 5:18 PM  [facility] [place], [place], [address] Phone: [phone]   Fax:  [phone]  Name: REJEANA FADNESS MRN: [id number] Date of Birth: 1936-02-18

## 2016-08-03 ENCOUNTER — Encounter: Payer: Self-pay | Admitting: Pediatrics

## 2016-08-03 ENCOUNTER — Ambulatory Visit (INDEPENDENT_AMBULATORY_CARE_PROVIDER_SITE_OTHER): Payer: Medicare Other | Admitting: Pediatrics

## 2016-08-03 VITALS — BP 107/68 | HR 92 | Temp 97.5°F | Ht 69.0 in | Wt 218.2 lb

## 2016-08-03 DIAGNOSIS — E785 Hyperlipidemia, unspecified: Secondary | ICD-10-CM

## 2016-08-03 DIAGNOSIS — Z6832 Body mass index (BMI) 32.0-32.9, adult: Secondary | ICD-10-CM

## 2016-08-03 DIAGNOSIS — R7303 Prediabetes: Secondary | ICD-10-CM | POA: Diagnosis not present

## 2016-08-03 DIAGNOSIS — I10 Essential (primary) hypertension: Secondary | ICD-10-CM | POA: Diagnosis not present

## 2016-08-03 LAB — BAYER DCA HB A1C WAIVED: HB A1C: 7 % — AB (ref <7.0)

## 2016-08-03 NOTE — Progress Notes (Signed)
  Subjective:   Patient ID: Mary Moss, female    DOB: [birth year], 81 y.o.   MRN: [id number] CC: Hypertension (6 mos ckup)  HPI: Mary Moss is a 81 y.o. female presenting for Hypertension (6 mos ckup)  OA: had knee replacement 2 months ago Done with PT but continuing exercises, stretching at home  HTN: no CP, no SOB, no dizziness  Pre-diabetes:  Avoiding sugar  HLD: has not been able to tolerate statins, tried multiple statins  Relevant past medical, surgical, family and social history reviewed. Allergies and medications reviewed and updated. History  Smoking Status  . Former Smoker  . Packs/day: 0.50  . Years: 40.00  . Types: Cigarettes  . Start date: 07/27/1957  . Quit date: 12/15/2007  Smokeless Tobacco  . Never Used   ROS: Per HPI   Objective:    BP 107/68   Pulse 92   Temp 97.5 F (36.4 C) (Oral)   Ht 5\' 9"  (1.753 m)   Wt 218 lb 3.2 oz (99 kg)   BMI 32.22 kg/m   Wt Readings from Last 3 Encounters:  08/03/16 218 lb 3.2 oz (99 kg)  06/08/16 225 lb (102.1 kg)  06/01/16 225 lb (102.1 kg)    Gen: NAD, alert, cooperative with exam, NCAT EYES: EOMI, no conjunctival injection, or no icterus CV: NRRR, normal S1/S2, no murmur Resp: CTABL, no wheezes, normal WOB Abd: +BS, soft, NTND. no guarding or organomegaly Ext: L ankle slightly swollen compared with R ankle Neuro: Alert and oriented, strength equal b/l UE and LE, coordination grossly normal MSK: normal muscle bulk  Assessment & Plan:  Rachiel was seen today for hypertension.  Diagnoses and all orders for this visit:  Essential hypertension wellcontrolled No s/e of medications, no dizziness/lightheadedness Cont meds  BMI 32.0-32.9,adult Discussed lifestyle changes, pt wants to increase wlaking now that knee is replaced  Pre-diabetes Avoiding sugar Recheck A1c -     Bayer DCA Hb A1c Waived  Hyperlipidemia, unspecified hyperlipidemia type Intolerant of statins No prior CVA/MI Increase  fruits/veg, activity levels  Follow up plan: Return in about 6 months (around 01/31/2017). Assunta Found, MD [place]

## 2016-08-07 DIAGNOSIS — L814 Other melanin hyperpigmentation: Secondary | ICD-10-CM | POA: Diagnosis not present

## 2016-08-07 DIAGNOSIS — L438 Other lichen planus: Secondary | ICD-10-CM | POA: Diagnosis not present

## 2016-08-07 DIAGNOSIS — L905 Scar conditions and fibrosis of skin: Secondary | ICD-10-CM | POA: Diagnosis not present

## 2016-08-07 DIAGNOSIS — D1801 Hemangioma of skin and subcutaneous tissue: Secondary | ICD-10-CM | POA: Diagnosis not present

## 2016-08-07 DIAGNOSIS — Z85828 Personal history of other malignant neoplasm of skin: Secondary | ICD-10-CM | POA: Diagnosis not present

## 2016-08-07 DIAGNOSIS — C44719 Basal cell carcinoma of skin of left lower limb, including hip: Secondary | ICD-10-CM | POA: Diagnosis not present

## 2016-08-07 DIAGNOSIS — D225 Melanocytic nevi of trunk: Secondary | ICD-10-CM | POA: Diagnosis not present

## 2016-08-07 DIAGNOSIS — L820 Inflamed seborrheic keratosis: Secondary | ICD-10-CM | POA: Diagnosis not present

## 2016-08-07 DIAGNOSIS — C44519 Basal cell carcinoma of skin of other part of trunk: Secondary | ICD-10-CM | POA: Diagnosis not present

## 2016-08-07 DIAGNOSIS — L821 Other seborrheic keratosis: Secondary | ICD-10-CM | POA: Diagnosis not present

## 2016-08-18 ENCOUNTER — Other Ambulatory Visit: Payer: Self-pay | Admitting: Pediatrics

## 2016-08-18 DIAGNOSIS — Z471 Aftercare following joint replacement surgery: Secondary | ICD-10-CM | POA: Diagnosis not present

## 2016-08-18 DIAGNOSIS — Z96651 Presence of right artificial knee joint: Secondary | ICD-10-CM | POA: Diagnosis not present

## 2016-08-18 DIAGNOSIS — I10 Essential (primary) hypertension: Secondary | ICD-10-CM

## 2016-08-18 MED ORDER — HYDROCHLOROTHIAZIDE 25 MG PO TABS: 25.0000 mg | ORAL_TABLET | Freq: Every day | ORAL | 3 refills | Status: DC

## 2016-08-18 NOTE — Telephone Encounter (Signed)
done

## 2016-08-23 DIAGNOSIS — M25512 Pain in left shoulder: Secondary | ICD-10-CM | POA: Diagnosis not present

## 2016-08-24 DIAGNOSIS — Z961 Presence of intraocular lens: Secondary | ICD-10-CM | POA: Diagnosis not present

## 2016-09-14 DIAGNOSIS — Z1231 Encounter for screening mammogram for malignant neoplasm of breast: Secondary | ICD-10-CM | POA: Diagnosis not present

## 2016-10-29 ENCOUNTER — Telehealth: Payer: Self-pay | Admitting: Pediatrics

## 2016-10-29 DIAGNOSIS — I10 Essential (primary) hypertension: Secondary | ICD-10-CM

## 2016-10-29 MED ORDER — HYDROCHLOROTHIAZIDE 25 MG PO TABS: 25.0000 mg | ORAL_TABLET | Freq: Every day | ORAL | 0 refills | Status: DC

## 2016-10-29 MED ORDER — LISINOPRIL 10 MG PO TABS: 10.0000 mg | ORAL_TABLET | Freq: Every day | ORAL | 0 refills | Status: DC

## 2016-10-29 NOTE — Telephone Encounter (Signed)
done

## 2016-10-29 NOTE — Telephone Encounter (Signed)
What is the name of the medication? Two blood pressure meds  Have you contacted your pharmacy to request a refill? No. This is new at [facility] would you [facility]   Patient notified that their request is being sent to the clinical staff for review and that they should receive a call once it is complete. If they do not receive a call within 24 hours they can check with their pharmacy or our office.

## 2016-11-18 ENCOUNTER — Telehealth: Payer: Self-pay | Admitting: *Deleted

## 2016-11-18 NOTE — Telephone Encounter (Signed)
Received letter from patient stating that she would like to see an endocrinologist regarding her diabetes.  Patient is requesting that lab results and office notes be sent to Dr. Ronnald Ramp.  Faxed records to [phone].

## 2017-01-04 DIAGNOSIS — E784 Other hyperlipidemia: Secondary | ICD-10-CM | POA: Diagnosis not present

## 2017-01-04 DIAGNOSIS — Z6832 Body mass index (BMI) 32.0-32.9, adult: Secondary | ICD-10-CM | POA: Diagnosis not present

## 2017-01-04 DIAGNOSIS — E559 Vitamin D deficiency, unspecified: Secondary | ICD-10-CM | POA: Diagnosis not present

## 2017-01-04 DIAGNOSIS — R946 Abnormal results of thyroid function studies: Secondary | ICD-10-CM | POA: Diagnosis not present

## 2017-01-04 DIAGNOSIS — Z1389 Encounter for screening for other disorder: Secondary | ICD-10-CM | POA: Diagnosis not present

## 2017-01-04 DIAGNOSIS — I10 Essential (primary) hypertension: Secondary | ICD-10-CM | POA: Diagnosis not present

## 2017-01-04 DIAGNOSIS — R7309 Other abnormal glucose: Secondary | ICD-10-CM | POA: Diagnosis not present

## 2017-01-13 DIAGNOSIS — J301 Allergic rhinitis due to pollen: Secondary | ICD-10-CM | POA: Diagnosis not present

## 2017-01-13 DIAGNOSIS — B9789 Other viral agents as the cause of diseases classified elsewhere: Secondary | ICD-10-CM | POA: Diagnosis not present

## 2017-01-13 DIAGNOSIS — J069 Acute upper respiratory infection, unspecified: Secondary | ICD-10-CM | POA: Diagnosis not present

## 2017-01-13 DIAGNOSIS — R05 Cough: Secondary | ICD-10-CM | POA: Diagnosis not present

## 2017-01-13 DIAGNOSIS — R062 Wheezing: Secondary | ICD-10-CM | POA: Diagnosis not present

## 2017-01-24 ENCOUNTER — Other Ambulatory Visit: Payer: Self-pay | Admitting: Pediatrics

## 2017-01-24 DIAGNOSIS — I10 Essential (primary) hypertension: Secondary | ICD-10-CM

## 2017-01-31 ENCOUNTER — Other Ambulatory Visit: Payer: Self-pay | Admitting: Pediatrics

## 2017-01-31 DIAGNOSIS — I10 Essential (primary) hypertension: Secondary | ICD-10-CM

## 2017-02-01 ENCOUNTER — Encounter: Payer: Self-pay | Admitting: Pediatrics

## 2017-02-01 ENCOUNTER — Ambulatory Visit (INDEPENDENT_AMBULATORY_CARE_PROVIDER_SITE_OTHER): Payer: Medicare Other | Admitting: Pediatrics

## 2017-02-01 VITALS — BP 132/86 | HR 76 | Temp 97.7°F | Ht 69.0 in | Wt 218.6 lb

## 2017-02-01 DIAGNOSIS — Z6832 Body mass index (BMI) 32.0-32.9, adult: Secondary | ICD-10-CM

## 2017-02-01 DIAGNOSIS — R739 Hyperglycemia, unspecified: Secondary | ICD-10-CM

## 2017-02-01 DIAGNOSIS — I10 Essential (primary) hypertension: Secondary | ICD-10-CM | POA: Diagnosis not present

## 2017-02-01 MED ORDER — LISINOPRIL 10 MG PO TABS: 10.0000 mg | ORAL_TABLET | Freq: Every day | ORAL | 1 refills | Status: DC

## 2017-02-01 MED ORDER — HYDROCHLOROTHIAZIDE 25 MG PO TABS: 25.0000 mg | ORAL_TABLET | Freq: Every day | ORAL | 1 refills | Status: DC

## 2017-02-01 NOTE — Patient Instructions (Addendum)
Goal blood pressures: 120s-130s/70s  Check at home Return here in 2 weeks for nurse visit to recheck blood pressure  Bring lab results when you are able

## 2017-02-01 NOTE — Progress Notes (Signed)
  Subjective:   Patient ID: Mary Moss, female    DOB: 01-18-1936, 81 y.o.   MRN: [id number] CC: Follow-up (6 month) multiple med problems. HPI: Mary Moss is a 81 y.o. female presenting for Follow-up (6 month)  HTN: checks at home No CP, no SOB or HA No s/e from medications  Elevated blood sugar: per pt was seen by endocrinology, told that her blood sugar levels had returned to normal range  Elevated BMI: continues to do exercise classes, did one this morning Has good energy  Relevant past medical, surgical, family and social history reviewed. Allergies and medications reviewed and updated. History  Smoking Status  . Former Smoker  . Packs/day: 0.50  . Years: 40.00  . Types: Cigarettes  . Start date: 07/27/1957  . Quit date: 12/15/2007  Smokeless Tobacco  . Never Used   ROS: Per HPI   Objective:    BP (!) 148/91   Pulse 80   Temp 97.7 F (36.5 C) (Oral)   Ht 5\' 9"  (1.753 m)   Wt 218 lb 9.6 oz (99.2 kg)   BMI 32.28 kg/m   Wt Readings from Last 3 Encounters:  02/01/17 218 lb 9.6 oz (99.2 kg)  08/03/16 218 lb 3.2 oz (99 kg)  06/08/16 225 lb (102.1 kg)    Gen: NAD, alert, cooperative with exam, NCAT EYES: EOMI, no conjunctival injection, or no icterus ENT:  OP without erythema LYMPH: no cervical LAD CV: NRRR, normal S1/S2, no murmur, distal pulses 2+ b/l Resp: CTABL, no wheezes, normal WOB Abd: +BS, soft, NTND.  Ext: No edema, warm Neuro: Alert and oriented  Assessment & Plan:  Amarii was seen today for follow-up multiple med problems.  Diagnoses and all orders for this visit:  Essential hypertension Slightly elevated today, didn't take medications this morning Will check at home and rtc 2 weeks for nurse BP check If still elevated will increase lisinopril Had labs including kidney function recently drawn at endocrinology, will bring labs by -     hydrochlorothiazide (HYDRODIURIL) 25 MG tablet; Take 1 tablet (25 mg total) by mouth daily. -      lisinopril (PRINIVIL,ZESTRIL) 10 MG tablet; Take 1 tablet (10 mg total) by mouth daily.  BMI 32.0-32.9,adult Continues to exercise regularly, avoiding sugary foods  Elevated blood sugar She will bring lab results by when able, says that blood sugar levels returned to normal   Follow up plan: Return in about 6 months (around 08/04/2017). Assunta Found, MD [place]

## 2017-02-04 DIAGNOSIS — C4441 Basal cell carcinoma of skin of scalp and neck: Secondary | ICD-10-CM | POA: Diagnosis not present

## 2017-02-04 DIAGNOSIS — D0461 Carcinoma in situ of skin of right upper limb, including shoulder: Secondary | ICD-10-CM | POA: Diagnosis not present

## 2017-02-04 DIAGNOSIS — Z85828 Personal history of other malignant neoplasm of skin: Secondary | ICD-10-CM | POA: Diagnosis not present

## 2017-02-04 DIAGNOSIS — D692 Other nonthrombocytopenic purpura: Secondary | ICD-10-CM | POA: Diagnosis not present

## 2017-02-04 DIAGNOSIS — D1801 Hemangioma of skin and subcutaneous tissue: Secondary | ICD-10-CM | POA: Diagnosis not present

## 2017-02-04 DIAGNOSIS — D0472 Carcinoma in situ of skin of left lower limb, including hip: Secondary | ICD-10-CM | POA: Diagnosis not present

## 2017-02-04 DIAGNOSIS — D225 Melanocytic nevi of trunk: Secondary | ICD-10-CM | POA: Diagnosis not present

## 2017-02-04 DIAGNOSIS — L821 Other seborrheic keratosis: Secondary | ICD-10-CM | POA: Diagnosis not present

## 2017-02-04 DIAGNOSIS — L814 Other melanin hyperpigmentation: Secondary | ICD-10-CM | POA: Diagnosis not present

## 2017-02-04 DIAGNOSIS — L570 Actinic keratosis: Secondary | ICD-10-CM | POA: Diagnosis not present

## 2017-04-06 DIAGNOSIS — Z23 Encounter for immunization: Secondary | ICD-10-CM | POA: Diagnosis not present

## 2017-05-14 DIAGNOSIS — Z471 Aftercare following joint replacement surgery: Secondary | ICD-10-CM | POA: Diagnosis not present

## 2017-05-14 DIAGNOSIS — Z96651 Presence of right artificial knee joint: Secondary | ICD-10-CM | POA: Diagnosis not present

## 2017-05-14 DIAGNOSIS — M25561 Pain in right knee: Secondary | ICD-10-CM | POA: Diagnosis not present

## 2017-08-04 ENCOUNTER — Ambulatory Visit: Payer: Medicare Other | Admitting: Pediatrics

## 2017-08-06 DIAGNOSIS — R7309 Other abnormal glucose: Secondary | ICD-10-CM | POA: Diagnosis not present

## 2017-08-06 DIAGNOSIS — E7849 Other hyperlipidemia: Secondary | ICD-10-CM | POA: Diagnosis not present

## 2017-08-06 DIAGNOSIS — Z800 Family history of malignant neoplasm of digestive organs: Secondary | ICD-10-CM | POA: Diagnosis not present

## 2017-08-06 DIAGNOSIS — Z6833 Body mass index (BMI) 33.0-33.9, adult: Secondary | ICD-10-CM | POA: Diagnosis not present

## 2017-08-06 DIAGNOSIS — I10 Essential (primary) hypertension: Secondary | ICD-10-CM | POA: Diagnosis not present

## 2017-08-06 DIAGNOSIS — Z1389 Encounter for screening for other disorder: Secondary | ICD-10-CM | POA: Diagnosis not present

## 2017-08-06 DIAGNOSIS — R946 Abnormal results of thyroid function studies: Secondary | ICD-10-CM | POA: Diagnosis not present

## 2017-08-06 DIAGNOSIS — E559 Vitamin D deficiency, unspecified: Secondary | ICD-10-CM | POA: Diagnosis not present

## 2017-08-10 DIAGNOSIS — L814 Other melanin hyperpigmentation: Secondary | ICD-10-CM | POA: Diagnosis not present

## 2017-08-10 DIAGNOSIS — Z85828 Personal history of other malignant neoplasm of skin: Secondary | ICD-10-CM | POA: Diagnosis not present

## 2017-08-10 DIAGNOSIS — C44519 Basal cell carcinoma of skin of other part of trunk: Secondary | ICD-10-CM | POA: Diagnosis not present

## 2017-08-10 DIAGNOSIS — L821 Other seborrheic keratosis: Secondary | ICD-10-CM | POA: Diagnosis not present

## 2017-08-10 DIAGNOSIS — D2262 Melanocytic nevi of left upper limb, including shoulder: Secondary | ICD-10-CM | POA: Diagnosis not present

## 2017-08-10 DIAGNOSIS — C44319 Basal cell carcinoma of skin of other parts of face: Secondary | ICD-10-CM | POA: Diagnosis not present

## 2017-08-10 DIAGNOSIS — D225 Melanocytic nevi of trunk: Secondary | ICD-10-CM | POA: Diagnosis not present

## 2017-08-10 DIAGNOSIS — D1801 Hemangioma of skin and subcutaneous tissue: Secondary | ICD-10-CM | POA: Diagnosis not present

## 2017-08-15 ENCOUNTER — Other Ambulatory Visit: Payer: Self-pay | Admitting: Pediatrics

## 2017-08-15 DIAGNOSIS — I10 Essential (primary) hypertension: Secondary | ICD-10-CM

## 2017-08-27 ENCOUNTER — Ambulatory Visit: Payer: Medicare Other | Admitting: Pediatrics

## 2017-09-27 DIAGNOSIS — N390 Urinary tract infection, site not specified: Secondary | ICD-10-CM | POA: Diagnosis not present

## 2017-09-27 DIAGNOSIS — R319 Hematuria, unspecified: Secondary | ICD-10-CM | POA: Diagnosis not present

## 2017-10-08 DIAGNOSIS — Z961 Presence of intraocular lens: Secondary | ICD-10-CM | POA: Diagnosis not present

## 2017-10-08 DIAGNOSIS — Z1231 Encounter for screening mammogram for malignant neoplasm of breast: Secondary | ICD-10-CM | POA: Diagnosis not present

## 2017-10-08 DIAGNOSIS — H04123 Dry eye syndrome of bilateral lacrimal glands: Secondary | ICD-10-CM | POA: Diagnosis not present

## 2017-10-11 ENCOUNTER — Ambulatory Visit: Payer: Medicare Other

## 2017-10-19 ENCOUNTER — Ambulatory Visit (INDEPENDENT_AMBULATORY_CARE_PROVIDER_SITE_OTHER): Payer: Medicare Other | Admitting: *Deleted

## 2017-10-19 ENCOUNTER — Encounter (INDEPENDENT_AMBULATORY_CARE_PROVIDER_SITE_OTHER): Payer: Self-pay

## 2017-10-19 ENCOUNTER — Ambulatory Visit (INDEPENDENT_AMBULATORY_CARE_PROVIDER_SITE_OTHER): Payer: Medicare Other

## 2017-10-19 ENCOUNTER — Encounter: Payer: Self-pay | Admitting: *Deleted

## 2017-10-19 VITALS — BP 136/76 | HR 89 | Ht 66.5 in | Wt 224.0 lb

## 2017-10-19 DIAGNOSIS — Z0000 Encounter for general adult medical examination without abnormal findings: Secondary | ICD-10-CM | POA: Diagnosis not present

## 2017-10-19 DIAGNOSIS — Z23 Encounter for immunization: Secondary | ICD-10-CM | POA: Diagnosis not present

## 2017-10-19 DIAGNOSIS — Z13820 Encounter for screening for osteoporosis: Secondary | ICD-10-CM | POA: Diagnosis not present

## 2017-10-19 DIAGNOSIS — Z780 Asymptomatic menopausal state: Secondary | ICD-10-CM

## 2017-10-19 NOTE — Patient Instructions (Signed)
Mary Moss , Thank you for taking time to come for your Medicare Wellness Visit. I appreciate your ongoing commitment to your health goals. Please review the following plan we discussed and let me know if I can assist you in the future.   These are the goals we discussed: Goals    . Exercise 150 min/wk Moderate Activity       This is a list of the screening recommended for you and due dates:  Health Maintenance  Topic Date Due  . Mammogram  09/14/2017  . Tetanus Vaccine  02/01/2018*  . Flu Shot  Completed  . DEXA scan (bone density measurement)  Completed  . Pneumonia vaccines  Completed  *Topic was postponed. The date shown is not the original due date.    Pneumococcal Vaccine, Polyvalent solution for injection What is this medicine? PNEUMOCOCCAL VACCINE, POLYVALENT (NEU mo KOK al vak SEEN, pol ee VEY luhnt) is a vaccine to prevent pneumococcus bacteria infection. These bacteria are a major cause of ear infections, Strep throat infections, and serious pneumonia, meningitis, or blood infections worldwide. These vaccines help the body to produce antibodies (protective substances) that help your body defend against these bacteria. This vaccine is recommended for people 23 years of age and older with health problems. It is also recommended for all adults over 51 years old. This vaccine will not treat an infection. This medicine may be used for other purposes; ask your health care provider or pharmacist if you have questions. COMMON BRAND NAME(S): Pneumovax 23 What should I tell my health care provider before I take this medicine? They need to know if you have any of these conditions: -bleeding problems -bone marrow or organ transplant -cancer, Hodgkin's disease -fever -infection -immune system problems -low platelet count in the blood -seizures -an unusual or allergic reaction to pneumococcal vaccine, diphtheria toxoid, other vaccines, latex, other medicines, foods, dyes, or  preservatives -pregnant or trying to get pregnant -breast-feeding How should I use this medicine? This vaccine is for injection into a muscle or under the skin. It is given by a health care professional. A copy of Vaccine Information Statements will be given before each vaccination. Read this sheet carefully each time. The sheet may change frequently. Talk to your pediatrician regarding the use of this medicine in children. While this drug may be prescribed for children as young as 21 years of age for selected conditions, precautions do apply. Overdosage: If you think you have taken too much of this medicine contact a poison control center or emergency room at once. NOTE: This medicine is only for you. Do not share this medicine with others. What if I miss a dose? It is important not to miss your dose. Call your doctor or health care professional if you are unable to keep an appointment. What may interact with this medicine? -medicines for cancer chemotherapy -medicines that suppress your immune function -medicines that treat or prevent blood clots like warfarin, enoxaparin, and dalteparin -steroid medicines like prednisone or cortisone This list may not describe all possible interactions. Give your health care provider a list of all the medicines, herbs, non-prescription drugs, or dietary supplements you use. Also tell them if you smoke, drink alcohol, or use illegal drugs. Some items may interact with your medicine. What should I watch for while using this medicine? Mild fever and pain should go away in 3 days or less. Report any unusual symptoms to your doctor or health care professional. What side effects may I notice from  receiving this medicine? Side effects that you should report to your doctor or health care professional as soon as possible: -allergic reactions like skin rash, itching or hives, swelling of the face, lips, or tongue -breathing problems -confused -fever over 102 degrees  F -pain, tingling, numbness in the hands or feet -seizures -unusual bleeding or bruising -unusual muscle weakness Side effects that usually do not require medical attention (report to your doctor or health care professional if they continue or are bothersome): -aches and pains -diarrhea -fever of 102 degrees F or less -headache -irritable -loss of appetite -pain, tender at site where injected -trouble sleeping This list may not describe all possible side effects. Call your doctor for medical advice about side effects. You may report side effects to FDA at 1-800-FDA-1088. Where should I keep my medicine? This does not apply. This vaccine is given in a clinic, pharmacy, doctor's office, or other health care setting and will not be stored at home. NOTE: This sheet is a summary. It may not cover all possible information. If you have questions about this medicine, talk to your doctor, pharmacist, or health care provider.  2018 Elsevier/Gold Standard (2008-02-17 14:32:37)

## 2017-10-19 NOTE — Progress Notes (Addendum)
Subjective:   Mary Moss is a 82 y.o. female who presents for an Initial Medicare Annual Wellness Visit. Mary Moss has been widowed for the past 19 years and lives at home alone. She has 2 stepchildren, 2 grandchildren, and 2 great grandchildren. 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 She continues to do some work for him from time to time. She is very active socially and gets together with friends daily for breakfast. They also travel frequently. She is also active in her church. She enjoys reading and does crossword puzzles daily.   Review of Systems    Reports that her health is about the same as last year.   Cardiac Risk Factors include: advanced age (>24men, >105 women);sedentary lifestyle  Urinary: Some urgency. No stress incontinence.   Other systems negative    Objective:    Today's Vitals   10/19/17 0841  BP: 136/76  Pulse: 89  Weight: 224 lb (101.6 kg)  Height: 5' 6.5" (1.689 m)   Body mass index is 35.61 kg/m.  Advanced Directives 10/19/2017 06/16/2016 06/08/2016 06/01/2016 08/05/2015 02/26/2015 12/20/2014  Does Patient Have a Medical Advance Directive? Yes Yes Yes Yes Yes No Yes  Type of Advance Directive Living will;Healthcare Power of [place];Living will [place];Living will [place];Living will - [place];Living will  Does patient want to make changes to medical advance directive? No - Patient declined - No - Patient declined - - - No - Patient declined  Copy of [place] in Chart? - - No - copy requested No - copy requested No - copy requested - Yes  Would patient like information on creating a medical advance directive? No - Patient declined - - - - - -  Pre-existing out of facility DNR order (yellow form or pink MOST form) - - -  - - - -    Current Medications (verified) Outpatient Encounter Medications as of 10/19/2017  Medication Sig  . hydrochlorothiazide (HYDRODIURIL) 25 MG tablet Take 1 tablet (25 mg total) by mouth daily.  Marland Kitchen lisinopril (PRINIVIL,ZESTRIL) 10 MG tablet TAKE 1 TABLET BY MOUTH EVERY DAY  . aspirin 81 MG chewable tablet Chew 81 mg by mouth daily.   No facility-administered encounter medications on file as of 10/19/2017.     Allergies (verified) Statins; Neosporin [neomycin-bacitracin zn-polymyx]; and Polysporin [bacitracin-polymyxin b]   History: Past Medical History:  Diagnosis Date  . Ankle fracture fx 13 yrs ago, anklw swells off and on, has cramps in ankle also   left, to seee dr hewitt about 02-16-14  . Arthritis   . Basal cell carcinoma 2015   Nose  . GERD (gastroesophageal reflux disease)    OTC med  . Hypertension   . Pneumonia    Past Surgical History:  Procedure Laterality Date  . ABDOMINAL HYSTERECTOMY    . EYE SURGERY Bilateral    bilaterla cataract extraction with IOL  . FRACTURE SURGERY Left 2001  . JOINT REPLACEMENT    . KNEE ARTHROSCOPY Right 02/07/2014   Procedure: RIGHT ARTHROSCOPY KNEE, WITH MEDIAL MENISCAL DEBRIDEMENT AND CHONDRAPLASTY;  Surgeon: Gearlean Alf, MD;  Location: [facility];  Service: Orthopedics;  Laterality: Right;  . ROTATOR CUFF REPAIR Right 2009  . TOTAL ANKLE ARTHROPLASTY Left 12/20/2014   Procedure: LEFT TOTAL  ANKLE ARTHOPLASTY WITH PERCUTANEOUS ACHILLES TENDON LENGTHENING;  Surgeon: Wylene Simmer, MD;  Location: [facility];  Service: Orthopedics;  Laterality: Left;  . TOTAL KNEE ARTHROPLASTY Right 06/08/2016   Procedure: RIGHT TOTAL KNEE ARTHROPLASTY;  Surgeon: Gaynelle Arabian, MD;  Location: [facility];  Service: Orthopedics;  Laterality: Right;   Family History  Problem Relation Age of Onset  . Cancer Mother        colon  . Cancer Father        lung  . Cancer Sister        lung cancer-nonsmoker  . Alzheimer's disease Brother 47   Social History    Socioeconomic History  . Marital status: Widowed    Spouse name: Not on file  . Number of children: 0  . Years of education: 32  . Highest education level: Some college, no degree  Occupational History  . Occupation: Management consultant    Comment: retired  . 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 Financial resource strain: Not hard at all  . Food insecurity:    Worry: Never true    Inability: Never true  . Transportation needs:    Medical: No    Non-medical: No  Tobacco Use  . Smoking status: Former Smoker    Packs/day: 0.50    Years: 40.00    Pack years: 20.00    Types: Cigarettes    Start date: 07/27/1957    Last attempt to quit: 12/15/2007    Years since quitting: 9.8  . Smokeless tobacco: Never Used  Substance and Sexual Activity  . Alcohol use: No  . Drug use: No  . Sexual activity: Yes  Lifestyle  . Physical activity:    Days per week: 0 days    Minutes per session: 0 min  . Stress: Not at all  Relationships  . Social connections:    Talks on phone: More than three times a week    Gets together: More than three times a week    Attends religious service: More than 4 times per year    Active member of club or organization: Yes    Attends meetings of clubs or organizations: More than 4 times per year    Relationship status: Widowed  Other Topics Concern  . Not on file  Social History Narrative  . Not on file    Tobacco Counseling Counseling given: Not Answered   Clinical Intake:     Pain : No/denies pain     Nutritional Status: BMI 25 -29 Overweight Diabetes: No  How often do you need to have someone help you when you read instructions, pamphlets, or other written materials from your doctor or pharmacy?: 1 - Never What is the last grade level you completed in school?: 1 year of college  Interpreter Needed?: No  Information entered by :: Chong Sicilian, RN   Activities of Daily Living In your present state  of health, do you have any difficulty performing the following activities: 10/19/2017  Hearing? Y  Comment some hearing loss in right ear. last checked a couple of yeas ago.   Vision? N  Comment recent exam 2 weeks ago  Difficulty concentrating or making decisions? N  Walking or climbing stairs? N  Dressing or bathing? N  Doing errands, shopping? N  Preparing Food and eating ? N  Using the Toilet? N  In the past six months, have you accidently leaked urine? Y  Comment some urge incontinence  Do  you have problems with loss of bowel control? N  Managing your Medications? N  Managing your Finances? N  Housekeeping or managing your Housekeeping? N  Some recent data might be hidden     Immunizations and Health Maintenance Immunization History  Administered Date(s) Administered  . Influenza, High Dose Seasonal PF 05/19/2016  . Influenza,inj,Quad PF,6+ Mos 05/31/2013, 08/11/2014, 06/13/2015  . Influenza-Unspecified 04/06/2017  . Pneumococcal Conjugate-13 06/13/2015  . Pneumococcal Polysaccharide-23 10/19/2017   Health Maintenance Due  Topic Date Due  . MAMMOGRAM  09/14/2017    Patient Care Team: Eustaquio Maize, MD as PCP - General (Pediatrics)  No hospitalizations, ER visits, or surgeries this past year.      Assessment:   This is a routine wellness examination for [place].  Hearing/Vision screen No deficits noted during visit.   Dietary issues and exercise activities discussed: Current Exercise Habits: The patient does not participate in regular exercise at present, Exercise limited by: None identified  Goals    . Exercise 150 min/wk Moderate Activity      Depression Screen PHQ 2/9 Scores 10/19/2017 02/01/2017 08/03/2016 01/31/2016 09/02/2015 08/05/2015 07/03/2015  PHQ - 2 Score 0 0 0 0 0 0 0    Fall Risk Fall Risk  10/19/2017 02/01/2017 01/31/2016 09/02/2015 08/05/2015  Falls in the past year? No No No No No    Is the patient's home free of loose throw rugs in walkways, pet  beds, electrical cords, etc?   yes      Grab bars in the bathroom? no      Handrails on the stairs?   yes      Adequate lighting?   yes   Cognitive Function: MMSE - Mini Mental State Exam 10/19/2017 08/05/2015  Orientation to time 4 5  Orientation to Place 5 5  Registration 3 3  Attention/ Calculation 5 5  Recall 3 3  Language- name 2 objects 2 2  Language- repeat 1 1  Language- follow 3 step command 3 3  Language- read & follow direction 1 1  Write a sentence 1 1  Copy design 1 1  Total score 29 30    Normal exam    Screening Tests Health Maintenance  Topic Date Due  . MAMMOGRAM  09/14/2017  . TETANUS/TDAP  02/01/2018 (Originally 06/19/1955)  . INFLUENZA VACCINE  Completed  . DEXA SCAN  Completed  . PNA vac Low Risk Adult  Completed      Plan:  Pneumovax given today dexa scan done today Keep f/u with PCP Will request mammogram report from [facility] price of Tdap at next visit Move carefully to avoid falls Increase activity level. Aim for 150 minutes of moderate activity a week   I have personally reviewed and noted the following in the patient's chart:   . Medical and social history . Use of alcohol, tobacco or illicit drugs  . Current medications and supplements . Functional ability and status . Nutritional status . Physical activity . Advanced directives . List of other physicians . Hospitalizations, surgeries, and ER visits in previous 12 months . Vitals . Screenings to include cognitive, depression, and falls . Referrals and appointments  In addition, I have reviewed and discussed with patient certain preventive protocols, quality metrics, and best practice recommendations. A written personalized care plan for preventive services as well as general preventive health recommendations were provided to patient.     Chong Sicilian, RN   10/19/2017   I have reviewed and agree with the above  AWV documentation.   Mary-Margaret Hassell Done, FNP

## 2017-11-07 DIAGNOSIS — R05 Cough: Secondary | ICD-10-CM | POA: Diagnosis not present

## 2017-11-07 DIAGNOSIS — J189 Pneumonia, unspecified organism: Secondary | ICD-10-CM | POA: Diagnosis not present

## 2017-11-10 ENCOUNTER — Ambulatory Visit (INDEPENDENT_AMBULATORY_CARE_PROVIDER_SITE_OTHER): Payer: Medicare Other | Admitting: Family Medicine

## 2017-11-10 ENCOUNTER — Encounter: Payer: Self-pay | Admitting: Family Medicine

## 2017-11-10 VITALS — BP 120/68 | HR 101 | Temp 97.7°F | Ht 66.5 in | Wt 216.8 lb

## 2017-11-10 DIAGNOSIS — J181 Lobar pneumonia, unspecified organism: Secondary | ICD-10-CM

## 2017-11-10 DIAGNOSIS — J189 Pneumonia, unspecified organism: Secondary | ICD-10-CM

## 2017-11-10 MED ORDER — HYDROCODONE-HOMATROPINE 5-1.5 MG/5ML PO SYRP: 5.0000 mL | ORAL_SOLUTION | Freq: Four times a day (QID) | ORAL | 0 refills | Status: DC | PRN

## 2017-11-10 NOTE — Patient Instructions (Signed)
Great to meet you! 

## 2017-11-10 NOTE — Progress Notes (Signed)
   HPI  Patient presents today for follow-up from urgent care.  Patient was seen in urgent care 3 days ago and diagnosed with a right lower lobe pneumonia.  She was started on Levaquin and states that she is improving.  She states she is tolerating food and fluids like usual, her appetite is improving.  She still has some weakness which also seems to be improving.   PMH: Smoking status noted ROS: Per HPI  Objective: BP 120/68   Pulse (!) 101   Temp 97.7 F (36.5 C) (Oral)   Ht 5' 6.5" (1.689 m)   Wt 216 lb 12.8 oz (98.3 kg)   SpO2 90%   BMI 34.47 kg/m  Gen: NAD, alert, cooperative with exam HEENT: NCAT, moist oral mucosa CV: RRR, good S1/S2, no murmur Resp: CTABL, no wheezes, non-labored Ext: No edema, warm Neuro: Alert and oriented, No gross deficits  Assessment and plan:  #Community-acquired pneumonia Right lower lobe pneumonia, treated with Levaquin from urgent care Improving Patient with significant nighttime cough and difficulty sleeping, and Hycodan cough syrup for this. Return to clinic with any concerns   Meds ordered this encounter  Medications  . HYDROcodone-homatropine (HYCODAN) 5-1.5 MG/5ML syrup    Sig: Take 5 mLs by mouth every 6 (six) hours as needed for cough.    Dispense:  120 mL    Refill:  0    Laroy Apple, MD [facility] 11/10/2017, 9:51 AM

## 2017-11-17 ENCOUNTER — Other Ambulatory Visit: Payer: Self-pay | Admitting: Pediatrics

## 2017-11-17 DIAGNOSIS — I10 Essential (primary) hypertension: Secondary | ICD-10-CM

## 2017-11-18 ENCOUNTER — Other Ambulatory Visit: Payer: Self-pay | Admitting: Pediatrics

## 2017-11-18 DIAGNOSIS — I10 Essential (primary) hypertension: Secondary | ICD-10-CM

## 2017-11-26 DIAGNOSIS — Z96651 Presence of right artificial knee joint: Secondary | ICD-10-CM | POA: Diagnosis not present

## 2017-11-26 DIAGNOSIS — Z471 Aftercare following joint replacement surgery: Secondary | ICD-10-CM | POA: Diagnosis not present

## 2017-11-26 DIAGNOSIS — M1711 Unilateral primary osteoarthritis, right knee: Secondary | ICD-10-CM | POA: Diagnosis not present

## 2017-11-30 DIAGNOSIS — M2042 Other hammer toe(s) (acquired), left foot: Secondary | ICD-10-CM | POA: Diagnosis not present

## 2017-11-30 DIAGNOSIS — M79676 Pain in unspecified toe(s): Secondary | ICD-10-CM | POA: Diagnosis not present

## 2017-11-30 DIAGNOSIS — L84 Corns and callosities: Secondary | ICD-10-CM | POA: Diagnosis not present

## 2017-12-16 DIAGNOSIS — Z96662 Presence of left artificial ankle joint: Secondary | ICD-10-CM | POA: Diagnosis not present

## 2017-12-16 DIAGNOSIS — Z96651 Presence of right artificial knee joint: Secondary | ICD-10-CM | POA: Diagnosis not present

## 2017-12-16 DIAGNOSIS — Z888 Allergy status to other drugs, medicaments and biological substances status: Secondary | ICD-10-CM | POA: Diagnosis not present

## 2017-12-16 DIAGNOSIS — L84 Corns and callosities: Secondary | ICD-10-CM | POA: Diagnosis not present

## 2017-12-16 DIAGNOSIS — M2042 Other hammer toe(s) (acquired), left foot: Secondary | ICD-10-CM | POA: Diagnosis not present

## 2017-12-16 DIAGNOSIS — Z79899 Other long term (current) drug therapy: Secondary | ICD-10-CM | POA: Diagnosis not present

## 2017-12-16 DIAGNOSIS — I10 Essential (primary) hypertension: Secondary | ICD-10-CM | POA: Diagnosis not present

## 2017-12-16 DIAGNOSIS — M1990 Unspecified osteoarthritis, unspecified site: Secondary | ICD-10-CM | POA: Diagnosis not present

## 2017-12-16 DIAGNOSIS — Z87891 Personal history of nicotine dependence: Secondary | ICD-10-CM | POA: Diagnosis not present

## 2017-12-28 DIAGNOSIS — M79675 Pain in left toe(s): Secondary | ICD-10-CM | POA: Diagnosis not present

## 2017-12-28 DIAGNOSIS — M2042 Other hammer toe(s) (acquired), left foot: Secondary | ICD-10-CM | POA: Diagnosis not present

## 2018-02-02 DIAGNOSIS — Z6833 Body mass index (BMI) 33.0-33.9, adult: Secondary | ICD-10-CM | POA: Diagnosis not present

## 2018-02-02 DIAGNOSIS — R7309 Other abnormal glucose: Secondary | ICD-10-CM | POA: Diagnosis not present

## 2018-02-02 DIAGNOSIS — E7849 Other hyperlipidemia: Secondary | ICD-10-CM | POA: Diagnosis not present

## 2018-02-02 DIAGNOSIS — R946 Abnormal results of thyroid function studies: Secondary | ICD-10-CM | POA: Diagnosis not present

## 2018-02-02 DIAGNOSIS — E559 Vitamin D deficiency, unspecified: Secondary | ICD-10-CM | POA: Diagnosis not present

## 2018-02-02 DIAGNOSIS — I10 Essential (primary) hypertension: Secondary | ICD-10-CM | POA: Diagnosis not present

## 2018-02-08 ENCOUNTER — Encounter: Payer: Self-pay | Admitting: Family

## 2018-02-08 ENCOUNTER — Ambulatory Visit (INDEPENDENT_AMBULATORY_CARE_PROVIDER_SITE_OTHER): Payer: Medicare Other | Admitting: Family

## 2018-02-08 VITALS — BP 150/85 | HR 92 | Temp 96.8°F | Ht 66.5 in | Wt 224.0 lb

## 2018-02-08 DIAGNOSIS — M545 Low back pain: Secondary | ICD-10-CM

## 2018-02-08 MED ORDER — METHYLPREDNISOLONE ACETATE 80 MG/ML IJ SUSP
80.0000 mg | Freq: Once | INTRAMUSCULAR | Status: AC
Start: 2018-02-08 — End: 2018-02-08
  Administered 2018-02-08: 80 mg via INTRAMUSCULAR

## 2018-02-08 MED ORDER — KETOROLAC TROMETHAMINE 60 MG/2ML IM SOLN
60.0000 mg | Freq: Once | INTRAMUSCULAR | Status: AC
  Administered 2018-02-08: 60 mg via INTRAMUSCULAR

## 2018-02-08 MED ORDER — MELOXICAM 7.5 MG PO TABS: 7.5000 mg | ORAL_TABLET | Freq: Every day | ORAL | 0 refills | Status: DC

## 2018-02-08 NOTE — Patient Instructions (Signed)

## 2018-02-08 NOTE — Progress Notes (Signed)
   Subjective:    Patient ID: Mary Moss, female    DOB: 20-May-1936, 82 y.o.   MRN: [id number]  Chief Complaint  Patient presents with  . left hip and lower back pain    Back Pain  This is a recurrent problem. The current episode started in the past 7 days. The problem occurs constantly. The problem has been gradually worsening since onset. The pain is present in the gluteal. The quality of the pain is described as aching. The pain radiates to the left thigh. The pain is at a severity of 10/10. The pain is moderate. The pain is worse during the night. The symptoms are aggravated by lying down. Associated symptoms include leg pain. Pertinent negatives include no bladder incontinence or bowel incontinence. Risk factors include obesity and menopause. She has tried NSAIDs for the symptoms. The treatment provided mild relief.      Review of Systems  Gastrointestinal: Negative for bowel incontinence.  Genitourinary: Negative for bladder incontinence.  Musculoskeletal: Positive for back pain.  All other systems reviewed and are negative.      Objective:   Physical Exam  Constitutional: She is oriented to person, place, and time. She appears well-developed and well-nourished. No distress.  HENT:  Head: Normocephalic.  Eyes: Pupils are equal, round, and reactive to light.  Neck: Normal range of motion. Neck supple. No thyromegaly present.  Cardiovascular: Normal rate, regular rhythm, normal heart sounds and intact distal pulses.  No murmur heard. Pulmonary/Chest: Effort normal and breath sounds normal. No respiratory distress. She has no wheezes.  Abdominal: Soft. Bowel sounds are normal. She exhibits no distension. There is no tenderness.  Musculoskeletal: Normal range of motion. She exhibits no edema or tenderness.  Full ROM, negative SLR, tenderness in left gluteal with flexion  Neurological: She is alert and oriented to person, place, and time. She has normal reflexes. No  cranial nerve deficit.  Skin: Skin is warm and dry.  Psychiatric: She has a normal mood and affect. Her behavior is normal. Judgment and thought content normal.  Vitals reviewed.     BP (!) 150/85   Pulse 92   Temp (!) 96.8 F (36 C) (Oral)   Ht 5' 6.5" (1.689 m)   Wt 224 lb (101.6 kg)   BMI 35.61 kg/m       Assessment & Plan:  Mary Moss comes in today with chief complaint of left hip and lower back pain   Diagnosis and orders addressed:  1. Acute left-sided low back pain, with sciatica presence unspecified Rest Ice  ROM exercises If pain does not improve call and we will do PT referral - methylPREDNISolone acetate (DEPO-MEDROL) injection 80 mg - ketorolac (TORADOL) injection 60 mg - meloxicam (MOBIC) 7.5 MG tablet; Take 1 tablet (7.5 mg total) by mouth daily.  Dispense: 20 tablet; Refill: 0    Evelina Dun, FNP

## 2018-02-10 ENCOUNTER — Telehealth: Payer: Self-pay | Admitting: Pediatrics

## 2018-02-10 MED ORDER — GABAPENTIN 100 MG PO CAPS: 100.0000 mg | ORAL_CAPSULE | Freq: Three times a day (TID) | ORAL | 0 refills | Status: DC

## 2018-02-10 NOTE — Telephone Encounter (Signed)
Pt aware.

## 2018-02-10 NOTE — Telephone Encounter (Signed)
Gabapentin Prescription sent to pharmacy. This may make her drowsy. If so she can take at night.

## 2018-02-10 NOTE — Telephone Encounter (Signed)
Pt states she is not any better, she can't even sleep due to the pain. She took flexeril, 2 aleve pm and a leftover hydrocodone 5/325 from her foot surgery before she could get any sleep. Pt wants to know what she should do? Should she see ortho? Please advise.

## 2018-02-11 DIAGNOSIS — M5416 Radiculopathy, lumbar region: Secondary | ICD-10-CM | POA: Diagnosis not present

## 2018-02-11 DIAGNOSIS — M545 Low back pain: Secondary | ICD-10-CM | POA: Diagnosis not present

## 2018-02-11 DIAGNOSIS — M5136 Other intervertebral disc degeneration, lumbar region: Secondary | ICD-10-CM | POA: Diagnosis not present

## 2018-02-14 ENCOUNTER — Other Ambulatory Visit: Payer: Self-pay | Admitting: Pediatrics

## 2018-02-14 DIAGNOSIS — I10 Essential (primary) hypertension: Secondary | ICD-10-CM

## 2018-04-26 DIAGNOSIS — C44519 Basal cell carcinoma of skin of other part of trunk: Secondary | ICD-10-CM | POA: Diagnosis not present

## 2018-04-26 DIAGNOSIS — L821 Other seborrheic keratosis: Secondary | ICD-10-CM | POA: Diagnosis not present

## 2018-04-26 DIAGNOSIS — Z85828 Personal history of other malignant neoplasm of skin: Secondary | ICD-10-CM | POA: Diagnosis not present

## 2018-04-26 DIAGNOSIS — L218 Other seborrheic dermatitis: Secondary | ICD-10-CM | POA: Diagnosis not present

## 2018-04-26 DIAGNOSIS — C44719 Basal cell carcinoma of skin of left lower limb, including hip: Secondary | ICD-10-CM | POA: Diagnosis not present

## 2018-04-26 DIAGNOSIS — D0439 Carcinoma in situ of skin of other parts of face: Secondary | ICD-10-CM | POA: Diagnosis not present

## 2018-04-26 DIAGNOSIS — L570 Actinic keratosis: Secondary | ICD-10-CM | POA: Diagnosis not present

## 2018-05-12 ENCOUNTER — Other Ambulatory Visit: Payer: Self-pay | Admitting: Pediatrics

## 2018-05-12 DIAGNOSIS — I10 Essential (primary) hypertension: Secondary | ICD-10-CM

## 2018-05-23 DIAGNOSIS — Z23 Encounter for immunization: Secondary | ICD-10-CM | POA: Diagnosis not present

## 2018-07-24 DIAGNOSIS — J40 Bronchitis, not specified as acute or chronic: Secondary | ICD-10-CM | POA: Diagnosis not present

## 2018-07-24 DIAGNOSIS — R05 Cough: Secondary | ICD-10-CM | POA: Diagnosis not present

## 2018-08-09 DIAGNOSIS — E559 Vitamin D deficiency, unspecified: Secondary | ICD-10-CM | POA: Diagnosis not present

## 2018-08-09 DIAGNOSIS — R7309 Other abnormal glucose: Secondary | ICD-10-CM | POA: Diagnosis not present

## 2018-08-09 DIAGNOSIS — I10 Essential (primary) hypertension: Secondary | ICD-10-CM | POA: Diagnosis not present

## 2018-08-09 DIAGNOSIS — E7849 Other hyperlipidemia: Secondary | ICD-10-CM | POA: Diagnosis not present

## 2018-08-09 DIAGNOSIS — R946 Abnormal results of thyroid function studies: Secondary | ICD-10-CM | POA: Diagnosis not present

## 2018-08-09 DIAGNOSIS — Z800 Family history of malignant neoplasm of digestive organs: Secondary | ICD-10-CM | POA: Diagnosis not present

## 2018-08-09 DIAGNOSIS — Z6836 Body mass index (BMI) 36.0-36.9, adult: Secondary | ICD-10-CM | POA: Diagnosis not present

## 2018-08-14 ENCOUNTER — Other Ambulatory Visit: Payer: Self-pay | Admitting: Pediatrics

## 2018-08-14 DIAGNOSIS — I10 Essential (primary) hypertension: Secondary | ICD-10-CM

## 2018-10-15 ENCOUNTER — Other Ambulatory Visit: Payer: Self-pay | Admitting: Pediatrics

## 2018-10-15 DIAGNOSIS — I10 Essential (primary) hypertension: Secondary | ICD-10-CM

## 2018-10-21 ENCOUNTER — Other Ambulatory Visit: Payer: Self-pay | Admitting: Family Medicine

## 2018-10-21 ENCOUNTER — Other Ambulatory Visit: Payer: Self-pay | Admitting: Pediatrics

## 2018-10-21 DIAGNOSIS — I10 Essential (primary) hypertension: Secondary | ICD-10-CM

## 2018-10-28 DIAGNOSIS — D225 Melanocytic nevi of trunk: Secondary | ICD-10-CM | POA: Diagnosis not present

## 2018-10-28 DIAGNOSIS — L814 Other melanin hyperpigmentation: Secondary | ICD-10-CM | POA: Diagnosis not present

## 2018-10-28 DIAGNOSIS — C44529 Squamous cell carcinoma of skin of other part of trunk: Secondary | ICD-10-CM | POA: Diagnosis not present

## 2018-10-28 DIAGNOSIS — C44319 Basal cell carcinoma of skin of other parts of face: Secondary | ICD-10-CM | POA: Diagnosis not present

## 2018-10-28 DIAGNOSIS — D1801 Hemangioma of skin and subcutaneous tissue: Secondary | ICD-10-CM | POA: Diagnosis not present

## 2018-10-28 DIAGNOSIS — L820 Inflamed seborrheic keratosis: Secondary | ICD-10-CM | POA: Diagnosis not present

## 2018-10-28 DIAGNOSIS — Z85828 Personal history of other malignant neoplasm of skin: Secondary | ICD-10-CM | POA: Diagnosis not present

## 2018-10-28 DIAGNOSIS — D2262 Melanocytic nevi of left upper limb, including shoulder: Secondary | ICD-10-CM | POA: Diagnosis not present

## 2018-10-28 DIAGNOSIS — L821 Other seborrheic keratosis: Secondary | ICD-10-CM | POA: Diagnosis not present

## 2018-10-28 DIAGNOSIS — C44519 Basal cell carcinoma of skin of other part of trunk: Secondary | ICD-10-CM | POA: Diagnosis not present

## 2018-10-28 DIAGNOSIS — L570 Actinic keratosis: Secondary | ICD-10-CM | POA: Diagnosis not present

## 2018-11-12 ENCOUNTER — Other Ambulatory Visit: Payer: Self-pay | Admitting: Family Medicine

## 2018-11-12 DIAGNOSIS — I10 Essential (primary) hypertension: Secondary | ICD-10-CM

## 2018-11-14 NOTE — Telephone Encounter (Signed)
Former Nurse, mental health. NTBS 30 days given 10/17/18

## 2018-11-14 NOTE — Telephone Encounter (Signed)
Patient is scheduled for this week to see provider.

## 2018-11-16 DIAGNOSIS — M25559 Pain in unspecified hip: Secondary | ICD-10-CM | POA: Diagnosis not present

## 2018-11-16 DIAGNOSIS — M5416 Radiculopathy, lumbar region: Secondary | ICD-10-CM | POA: Diagnosis not present

## 2018-11-17 ENCOUNTER — Other Ambulatory Visit: Payer: Self-pay

## 2018-11-18 ENCOUNTER — Ambulatory Visit (INDEPENDENT_AMBULATORY_CARE_PROVIDER_SITE_OTHER): Payer: Medicare Other | Admitting: Family

## 2018-11-18 ENCOUNTER — Encounter: Payer: Self-pay | Admitting: Family

## 2018-11-18 ENCOUNTER — Other Ambulatory Visit: Payer: Self-pay | Admitting: Family Medicine

## 2018-11-18 VITALS — BP 124/74 | HR 101 | Temp 98.3°F | Ht 66.5 in | Wt 222.6 lb

## 2018-11-18 DIAGNOSIS — F411 Generalized anxiety disorder: Secondary | ICD-10-CM

## 2018-11-18 DIAGNOSIS — R7303 Prediabetes: Secondary | ICD-10-CM | POA: Diagnosis not present

## 2018-11-18 DIAGNOSIS — E785 Hyperlipidemia, unspecified: Secondary | ICD-10-CM

## 2018-11-18 DIAGNOSIS — E559 Vitamin D deficiency, unspecified: Secondary | ICD-10-CM

## 2018-11-18 DIAGNOSIS — M1710 Unilateral primary osteoarthritis, unspecified knee: Secondary | ICD-10-CM | POA: Diagnosis not present

## 2018-11-18 DIAGNOSIS — I10 Essential (primary) hypertension: Secondary | ICD-10-CM

## 2018-11-18 DIAGNOSIS — Z6832 Body mass index (BMI) 32.0-32.9, adult: Secondary | ICD-10-CM

## 2018-11-18 DIAGNOSIS — M179 Osteoarthritis of knee, unspecified: Secondary | ICD-10-CM

## 2018-11-18 MED ORDER — HYDROCHLOROTHIAZIDE 25 MG PO TABS: 25.0000 mg | ORAL_TABLET | Freq: Every day | ORAL | 1 refills | Status: DC

## 2018-11-18 MED ORDER — LISINOPRIL 10 MG PO TABS: 10.0000 mg | ORAL_TABLET | Freq: Every day | ORAL | 1 refills | Status: DC

## 2018-11-18 NOTE — Telephone Encounter (Signed)
Last seen 02/08/18

## 2018-11-18 NOTE — Patient Instructions (Signed)

## 2018-11-18 NOTE — Progress Notes (Signed)
Subjective:    Patient ID: Mary Moss, female    DOB: 06-16-1936, 83 y.o.   MRN: [id number]  Chief Complaint  Patient presents with  . Establish Care  . Medical Management of Chronic Issues  . refills   Pt presents to the office today for chronic follow up. She is followed by [facility] for chronic back pain. She is followed by Endocrinologists every 6 months for prediabetes.   Hypertension  This is a chronic problem. The current episode started more than 1 year ago. The problem has been resolved since onset. The problem is controlled. Associated symptoms include anxiety and malaise/fatigue. Pertinent negatives include no peripheral edema or shortness of breath. Risk factors for coronary artery disease include dyslipidemia, obesity and sedentary lifestyle. The current treatment provides moderate improvement. There is no history of heart failure.  Hyperlipidemia  This is a chronic problem. The current episode started more than 1 year ago. The problem is uncontrolled. Exacerbating diseases include obesity. Pertinent negatives include no shortness of breath. Current antihyperlipidemic treatment includes diet change. The current treatment provides mild improvement of lipids. Risk factors for coronary artery disease include dyslipidemia, hypertension, a sedentary lifestyle and post-menopausal.  Arthritis  Presents for follow-up visit. She complains of pain and stiffness. Affected locations include the right knee and left knee (back). Her pain is at a severity of 5/10.  Anxiety  Presents for follow-up visit. Symptoms include excessive worry and nervous/anxious behavior. Patient reports no irritability, restlessness or shortness of breath. Symptoms occur occasionally.        Review of Systems  Constitutional: Positive for malaise/fatigue. Negative for irritability.  Respiratory: Negative for shortness of breath.   Musculoskeletal: Positive for arthritis and stiffness.   Psychiatric/Behavioral: The patient is nervous/anxious.   All other systems reviewed and are negative.      Objective:   Physical Exam Vitals signs reviewed.  Constitutional:      General: She is not in acute distress.    Appearance: She is well-developed.  HENT:     Head: Normocephalic and atraumatic.     Right Ear: Tympanic membrane normal.     Left Ear: Tympanic membrane normal.  Eyes:     Pupils: Pupils are equal, round, and reactive to light.  Neck:     Musculoskeletal: Normal range of motion and neck supple.     Thyroid: No thyromegaly.  Cardiovascular:     Rate and Rhythm: Normal rate and regular rhythm.     Heart sounds: Normal heart sounds. No murmur.  Pulmonary:     Effort: Pulmonary effort is normal. No respiratory distress.     Breath sounds: Normal breath sounds. No wheezing.  Abdominal:     General: Bowel sounds are normal. There is no distension.     Palpations: Abdomen is soft.     Tenderness: There is no abdominal tenderness.  Musculoskeletal: Normal range of motion.        General: No tenderness.  Skin:    General: Skin is warm and dry.  Neurological:     Mental Status: She is alert and oriented to person, place, and time.     Cranial Nerves: No cranial nerve deficit.     Deep Tendon Reflexes: Reflexes are normal and symmetric.  Psychiatric:        Behavior: Behavior normal.        Thought Content: Thought content normal.        Judgment: Judgment normal.       BP 124/74  Pulse (!) 101   Temp 98.3 F (36.8 C) (Oral)   Ht 5' 6.5" (1.689 m)   Wt 222 lb 9.6 oz (101 kg)   BMI 35.39 kg/m      Assessment & Plan:  Mary Moss comes in today with chief complaint of Establish Care; Medical Management of Chronic Issues; and refills   Diagnosis and orders addressed:  1. Essential hypertension - hydrochlorothiazide (HYDRODIURIL) 25 MG tablet; Take 1 tablet (25 mg total) by mouth daily. (Needs to be seen before next refill)  Dispense: 90  tablet; Refill: 1 - lisinopril (ZESTRIL) 10 MG tablet; Take 1 tablet (10 mg total) by mouth daily. (Please make appt for regular med ckup)  Dispense: 90 tablet; Refill: 1 - CMP14+EGFR - CBC with Differential/Platelet  2. GAD (generalized anxiety disorder) - CMP14+EGFR - CBC with Differential/Platelet  3. Hyperlipidemia, unspecified hyperlipidemia type - CMP14+EGFR - CBC with Differential/Platelet  4. Osteoarthritis of knee, unspecified laterality, unspecified osteoarthritis type - CMP14+EGFR - CBC with Differential/Platelet  5. Pre-diabetes  - CMP14+EGFR - CBC with Differential/Platelet  6. BMI 32.0-32.9,adult - CMP14+EGFR - CBC with Differential/Platelet  7. Vitamin D deficienc - CMP14+EGFR - CBC with Differential/Platelet  8. Morbid obesity ([place])  - CMP14+EGFR - CBC with Differential/Platelet   Labs pending Health Maintenance reviewed Diet and exercise encouraged  Follow up plan: 6 months    Evelina Dun, FNP

## 2018-11-19 LAB — CMP14+EGFR
ALT: 12 IU/L (ref 0–32)
AST: 18 IU/L (ref 0–40)
Albumin/Globulin Ratio: 1.3 (ref 1.2–2.2)
Albumin: 4 g/dL (ref 3.7–4.7)
Alkaline Phosphatase: 94 IU/L (ref 39–117)
BUN/Creatinine Ratio: 23 (ref 12–28)
BUN: 22 mg/dL (ref 8–27)
Bilirubin Total: 0.6 mg/dL (ref 0.0–1.2)
CO2: 22 mmol/L (ref 20–29)
Calcium: 9.9 mg/dL (ref 8.7–10.3)
Chloride: 98 mmol/L (ref 96–106)
Creatinine, Ser: 0.96 mg/dL (ref 0.57–1.00)
GFR calc Af Amer: 65 mL/min/{1.73_m2} (ref >59)
GFR calc non Af Amer: 56 mL/min/{1.73_m2} — ABNORMAL LOW (ref >59)
Globulin, Total: 3.1 g/dL (ref 1.5–4.5)
Glucose: 95 mg/dL (ref 65–99)
Potassium: 5 mmol/L (ref 3.5–5.2)
Sodium: 139 mmol/L (ref 134–144)
Total Protein: 7.1 g/dL (ref 6.0–8.5)

## 2018-11-19 LAB — CBC WITH DIFFERENTIAL/PLATELET
Basophils Absolute: 0.1 10*3/uL (ref 0.0–0.2)
Basos: 1 %
EOS (ABSOLUTE): 0.2 10*3/uL (ref 0.0–0.4)
Eos: 3 %
Hematocrit: 46.5 % (ref 34.0–46.6)
Hemoglobin: 15.4 g/dL (ref 11.1–15.9)
Immature Grans (Abs): 0 10*3/uL (ref 0.0–0.1)
Immature Granulocytes: 1 %
Lymphocytes Absolute: 2.8 10*3/uL (ref 0.7–3.1)
Lymphs: 36 %
MCH: 30.4 pg (ref 26.6–33.0)
MCHC: 33.1 g/dL (ref 31.5–35.7)
MCV: 92 fL (ref 79–97)
Monocytes Absolute: 0.6 10*3/uL (ref 0.1–0.9)
Monocytes: 7 %
Neutrophils Absolute: 4.2 10*3/uL (ref 1.4–7.0)
Neutrophils: 52 %
Platelets: 301 10*3/uL (ref 150–450)
RBC: 5.06 x10E6/uL (ref 3.77–5.28)
RDW: 12.3 % (ref 11.7–15.4)
WBC: 7.9 10*3/uL (ref 3.4–10.8)

## 2018-11-30 DIAGNOSIS — M5416 Radiculopathy, lumbar region: Secondary | ICD-10-CM | POA: Diagnosis not present

## 2018-11-30 DIAGNOSIS — M545 Low back pain: Secondary | ICD-10-CM | POA: Diagnosis not present

## 2018-12-08 ENCOUNTER — Encounter: Payer: Self-pay | Admitting: Physical Therapy

## 2018-12-08 ENCOUNTER — Other Ambulatory Visit: Payer: Self-pay

## 2018-12-08 ENCOUNTER — Ambulatory Visit: Payer: Medicare Other | Attending: Neurosurgery | Admitting: Physical Therapy

## 2018-12-08 DIAGNOSIS — R293 Abnormal posture: Secondary | ICD-10-CM | POA: Diagnosis not present

## 2018-12-08 DIAGNOSIS — M5442 Lumbago with sciatica, left side: Secondary | ICD-10-CM

## 2018-12-08 NOTE — Therapy (Signed)
[facility] [place], [place], [address] Phone: [phone]   Fax:  [phone]  Physical Therapy Evaluation  Patient Details  Name: Mary Moss MRN: [id number] Date of Birth: 1935/12/14 Referring Provider (PT): Earnie Larsson MD   Encounter Date: 12/08/2018  PT End of Session - 12/08/18 1354    Visit Number  1    Number of Visits  12    Date for PT Re-Evaluation  03/08/19    Authorization Type  FOTO AT LEAST EVERY 5TH VISIT.  KX MODIFIER AFTER 15 VISITS.  PROGRESS NOTE AT 10TH VISIT.    PT Start Time  0107    PT Stop Time  0200    PT Time Calculation (min)  53 min    Activity Tolerance  Patient tolerated treatment well    Behavior During Therapy  WFL for tasks assessed/performed       Past Medical History:  Diagnosis Date  . Ankle fracture fx 13 yrs ago, anklw swells off and on, has cramps in ankle also   left, to seee dr hewitt about 02-16-14  . Arthritis   . Basal cell carcinoma 2015   Nose  . GERD (gastroesophageal reflux disease)    OTC med  . Hypertension   . Pneumonia     Past Surgical History:  Procedure Laterality Date  . ABDOMINAL HYSTERECTOMY    . EYE SURGERY Bilateral    bilaterla cataract extraction with IOL  . FRACTURE SURGERY Left 2001  . JOINT REPLACEMENT    . KNEE ARTHROSCOPY Right 02/07/2014   Procedure: RIGHT ARTHROSCOPY KNEE, WITH MEDIAL MENISCAL DEBRIDEMENT AND CHONDRAPLASTY;  Surgeon: Gearlean Alf, MD;  Location: [facility];  Service: Orthopedics;  Laterality: Right;  . ROTATOR CUFF REPAIR Right 2009  . TOTAL ANKLE ARTHROPLASTY Left 12/20/2014   Procedure: LEFT TOTAL ANKLE ARTHOPLASTY WITH PERCUTANEOUS ACHILLES TENDON LENGTHENING;  Surgeon: Wylene Simmer, MD;  Location: [facility];  Service: Orthopedics;  Laterality: Left;  . TOTAL KNEE ARTHROPLASTY Right 06/08/2016   Procedure: RIGHT TOTAL KNEE ARTHROPLASTY;  Surgeon: Gaynelle Arabian, MD;  Location: [facility];  Service: Orthopedics;  Laterality:  Right;    There were no vitals filed for this visit.   Subjective Assessment - 12/08/18 1413    Subjective  COVID-19 screen performed prior to patient entering clinic.  The patient presents to the clinic today with c/o low back pain with pain running her left LE to her ankle.  She reports her pain came on suddenly and has been severe.  Her pain has been so severe that she has had to sleep in a recliner at home.  It is a bit better now.  Her FOTO score today was a 84% limitation.    Pertinent History  HTN, Arthritis, right ankle replacement; right RTC repair and right TKA.    How long can you sit comfortably?  <10 minutes.    How long can you stand comfortably?  <10 minutes.    How long can you walk comfortably?  Around home.    Diagnostic tests  MRI.    Patient Stated Goals  Get out of pain.    Currently in Pain?  Yes    Pain Score  6     Pain Location  Back    Pain Orientation  Left    Pain Descriptors / Indicators  Aching;Stabbing;Shooting;Sharp    Pain Type  Acute pain    Pain Onset  More than a month ago    Pain  Frequency  Constant    Aggravating Factors   Movement and prolonged positions (sitting/standing).         [facility] PT Assessment - 12/08/18 0001      Assessment   Medical Diagnosis  Radiculopathy, Lumbar.    Referring Provider (PT)  Earnie Larsson MD    Onset Date/Surgical Date  --   ~6 months ago.     Precautions   Precautions  None      Restrictions   Weight Bearing Restrictions  No      Balance Screen   Has the patient fallen in the past 6 months  No    Has the patient had a decrease in activity level because of a fear of falling?   Yes    Is the patient reluctant to leave their home because of a fear of falling?   No      Home Environment   Living Environment  Private residence      Prior Function   Level of Independence  Independent      Observation/Other Assessments   Focus on Therapeutic Outcomes (FOTO)   84% limitation.      Posture/Postural Control    Posture/Postural Control  Postural limitations    Postural Limitations  Rounded Shoulders;Forward head;Flexed trunk      Deep Tendon Reflexes   DTR Assessment Site  Patella;Achilles    Patella DTR  0    Achilles DTR  0      ROM / Strength   AROM / PROM / Strength  AROM;Strength      AROM   AROM Assessment Site  Lumbar    Lumbar Flexion  25% deficit.    Lumbar Extension  2 degrees.      Strength   Overall Strength Comments  Left hip flexion= 4-/5; left knee extension= 4/5, left ankle strength essentailly normal.  Right LE strength is essentially normal.      Palpation   Palpation comment  Very tender to palption over left SIJ.      Special Tests   Other special tests  Equal leg lengths.  (+) left SLR and (+) FABER test.      Bed Mobility   Bed Mobility  --   Slow and very painful.     Ambulation/Gait   Gait Comments  The patient walks in lumbar flexion and is in obvious pain.                Objective measurements completed on examination: See above findings.      [facility] Adult PT Treatment/Exercise - 12/08/18 0001      Modalities   Modalities  Electrical Stimulation;Moist Heat               PT Short Term Goals - 12/08/18 1606      PT SHORT TERM GOAL #1   Title  STG's=LTG's.        PT Long Term Goals - 12/08/18 1606      PT LONG TERM GOAL #1   Title  Independent with an HEP.    Time  8    Period  Weeks    Status  New      PT LONG TERM GOAL #2   Title  Patient sit 30 minutes with pain not > 3/10.    Time  8    Period  Weeks    Status  New      PT LONG TERM GOAL #3   Title  Stand 20  minutes with pain not > 3/10.    Time  8    Period  Weeks    Status  New      PT LONG TERM GOAL #4   Title  Eliminate left LE symptoms.    Time  8    Period  Weeks    Status  New      PT LONG TERM GOAL #5   Title  Perform ADL's with pain not > 3/10.    Time  8    Period  Weeks    Status  New             Plan - 12/08/18 1503     Clinical Impression Statement  The patient presents to OPPT with c/o of sevre low back pain that has been ongoing over the last 6 weeks.  Her pain run down the length of her left LE to her ankle.  Her pain has let up a bit as when it initially began she states she was in severe (10+/10) pain.  She was found to be bery tender to palption over her left SIJ and demonstrated a positive left SLR and FABER test.  She walks in spinal flexion due to pain.  Patient will benefit from skilled physical therapy intervention to address deficits and pain.    Personal Factors and Comorbidities  Age    Examination-Activity Limitations  Bathing;Bed Mobility;Bend    Stability/Clinical Decision Making  Evolving/Moderate complexity    Clinical Decision Making  Low    Rehab Potential  Excellent    PT Frequency  2x / week    PT Duration  6 weeks       Patient will benefit from skilled therapeutic intervention in order to improve the following deficits and impairments:  Abnormal gait, Pain, Decreased range of motion, Decreased strength, Postural dysfunction  Visit Diagnosis: Acute left-sided low back pain with left-sided sciatica - Plan: PT plan of care cert/re-cert  Abnormal posture - Plan: PT plan of care cert/re-cert     Problem List Patient Active Problem List   Diagnosis Date Noted  . Morbid obesity ([place]) 11/18/2018  . OA (osteoarthritis) of knee 06/08/2016  . Arthritis of knee, right 01/31/2016  . Hyperlipidemia 09/02/2015  . Pre-diabetes 07/12/2015  . Essential hypertension 07/03/2015  . Post-traumatic arthritis of ankle 12/20/2014  . Acute medial meniscal tear 02/06/2014  . Low bone mass 12/14/2012  . GAD (generalized anxiety disorder) 12/14/2012  . Vitamin D deficiency 12/14/2012    Daryn Hicks, [place] MPT 12/08/2018, 4:12 PM  [facility] [address] [place], [place], [address] Phone: [phone]   Fax:  [phone]  Name: Mary Moss MRN:  [id number] Date of Birth: 09-Jan-1936

## 2018-12-13 ENCOUNTER — Ambulatory Visit: Payer: Medicare Other | Admitting: Physical Therapy

## 2018-12-13 ENCOUNTER — Other Ambulatory Visit: Payer: Self-pay

## 2018-12-13 ENCOUNTER — Encounter: Payer: Self-pay | Admitting: Physical Therapy

## 2018-12-13 DIAGNOSIS — M5442 Lumbago with sciatica, left side: Secondary | ICD-10-CM

## 2018-12-13 DIAGNOSIS — R293 Abnormal posture: Secondary | ICD-10-CM | POA: Diagnosis not present

## 2018-12-13 NOTE — Therapy (Signed)
[facility] [place], [place], [address] Phone: [phone]   Fax:  [phone]  Physical Therapy Treatment  Patient Details  Name: Mary Moss MRN: [id number] Date of Birth: 11-19-1935 Referring Provider (PT): Earnie Larsson MD   Encounter Date: 12/13/2018  PT End of Session - 12/13/18 0954    Visit Number  2    Number of Visits  12    Date for PT Re-Evaluation  03/08/19    Authorization Type  FOTO AT LEAST EVERY 5TH VISIT.  KX MODIFIER AFTER 15 VISITS.  PROGRESS NOTE AT 10TH VISIT.    PT Start Time  [phone]    PT Stop Time  0934    PT Time Calculation (min)  62 min    Activity Tolerance  Patient tolerated treatment well    Behavior During Therapy  [facility] for tasks assessed/performed       Past Medical History:  Diagnosis Date  . Ankle fracture fx 13 yrs ago, anklw swells off and on, has cramps in ankle also   left, to seee dr hewitt about 02-16-14  . Arthritis   . Basal cell carcinoma 2015   Nose  . GERD (gastroesophageal reflux disease)    OTC med  . Hypertension   . Pneumonia     Past Surgical History:  Procedure Laterality Date  . ABDOMINAL HYSTERECTOMY    . EYE SURGERY Bilateral    bilaterla cataract extraction with IOL  . FRACTURE SURGERY Left 2001  . JOINT REPLACEMENT    . KNEE ARTHROSCOPY Right 02/07/2014   Procedure: RIGHT ARTHROSCOPY KNEE, WITH MEDIAL MENISCAL DEBRIDEMENT AND CHONDRAPLASTY;  Surgeon: Gearlean Alf, MD;  Location: [facility];  Service: Orthopedics;  Laterality: Right;  . ROTATOR CUFF REPAIR Right 2009  . TOTAL ANKLE ARTHROPLASTY Left 12/20/2014   Procedure: LEFT TOTAL ANKLE ARTHOPLASTY WITH PERCUTANEOUS ACHILLES TENDON LENGTHENING;  Surgeon: Wylene Simmer, MD;  Location: [facility];  Service: Orthopedics;  Laterality: Left;  . TOTAL KNEE ARTHROPLASTY Right 06/08/2016   Procedure: RIGHT TOTAL KNEE ARTHROPLASTY;  Surgeon: Gaynelle Arabian, MD;  Location: [facility];  Service: Orthopedics;  Laterality:  Right;    There were no vitals filed for this visit.  Subjective Assessment - 12/13/18 0917    Subjective  COVID-19 screen performed prior to patient entering clinic. My back is feeling better.    Pertinent History  HTN, Arthritis, right ankle replacement; right RTC repair and right TKA.    How long can you sit comfortably?  <10 minutes.    How long can you stand comfortably?  <10 minutes.    How long can you walk comfortably?  Around home.    Diagnostic tests  MRI.    Patient Stated Goals  Get out of pain.    Currently in Pain?  Yes    Pain Score  4     Pain Location  Back    Pain Orientation  Left    Pain Descriptors / Indicators  Aching;Sharp    Pain Onset  More than a month ago                       [facility] Adult PT Treatment/Exercise - 12/13/18 0001      Modalities   Modalities  Electrical Stimulation;Moist Heat;Ultrasound      Moist Heat Therapy   Number Minutes Moist Heat  20 Minutes    Moist Heat Location  Lumbar Spine      Electrical Stimulation  Electrical Stimulation Location  Left LB/SIJ    Electrical Stimulation Action  Pre-mod.    Electrical Stimulation Parameters  80-150 W/CM2 x 20 minutes.    Electrical Stimulation Goals  Pain      Ultrasound   Ultrasound Location  Left SIJ/left low back.    Ultrasound Parameters  Combo e'stim/U/S at 1.50 W/CM2 x 12 minutes.    Ultrasound Goals  Pain      Manual Therapy   Manual Therapy  Soft tissue mobilization    Soft tissue mobilization  Right sdly position with folded pillow betwen knees for comfort:  STW/M x 12 minutes to left low back and SIJ region.               PT Short Term Goals - 12/08/18 1606      PT SHORT TERM GOAL #1   Title  STG's=LTG's.        PT Long Term Goals - 12/08/18 1606      PT LONG TERM GOAL #1   Title  Independent with an HEP.    Time  8    Period  Weeks    Status  New      PT LONG TERM GOAL #2   Title  Patient sit 30 minutes with pain not > 3/10.     Time  8    Period  Weeks    Status  New      PT LONG TERM GOAL #3   Title  Stand 20 minutes with pain not > 3/10.    Time  8    Period  Weeks    Status  New      PT LONG TERM GOAL #4   Title  Eliminate left LE symptoms.    Time  8    Period  Weeks    Status  New      PT LONG TERM GOAL #5   Title  Perform ADL's with pain not > 3/10.    Time  8    Period  Weeks    Status  New            Plan - 12/13/18 5361    Clinical Impression Statement  Patient already demonstrating notable improvement with regards to decreased pain.  She had much less palpable tenderness in left SIJ region today.    Stability/Clinical Decision Making  Evolving/Moderate complexity       Patient will benefit from skilled therapeutic intervention in order to improve the following deficits and impairments:  Abnormal gait, Pain, Decreased range of motion, Decreased strength, Postural dysfunction  Visit Diagnosis: Acute left-sided low back pain with left-sided sciatica  Abnormal posture     Problem List Patient Active Problem List   Diagnosis Date Noted  . Morbid obesity ([place]) 11/18/2018  . OA (osteoarthritis) of knee 06/08/2016  . Arthritis of knee, right 01/31/2016  . Hyperlipidemia 09/02/2015  . Pre-diabetes 07/12/2015  . Essential hypertension 07/03/2015  . Post-traumatic arthritis of ankle 12/20/2014  . Acute medial meniscal tear 02/06/2014  . Low bone mass 12/14/2012  . GAD (generalized anxiety disorder) 12/14/2012  . Vitamin D deficiency 12/14/2012    Sharie Amorin, [place] MPT 12/13/2018, 9:55 AM  [facility] [address] [place], [place], [address] Phone: [phone]   Fax:  [phone]  Name: MOLLY SAVARINO MRN: [id number] Date of Birth: 11-Feb-1936

## 2018-12-15 ENCOUNTER — Other Ambulatory Visit: Payer: Self-pay

## 2018-12-15 ENCOUNTER — Ambulatory Visit: Payer: Medicare Other | Admitting: Physical Therapy

## 2018-12-15 DIAGNOSIS — R293 Abnormal posture: Secondary | ICD-10-CM | POA: Diagnosis not present

## 2018-12-15 DIAGNOSIS — M5442 Lumbago with sciatica, left side: Secondary | ICD-10-CM | POA: Diagnosis not present

## 2018-12-15 NOTE — Therapy (Signed)
[name], [place], [address] Phone: [phone]   Fax:  [phone]  Physical Therapy Treatment  Patient Details  Name: Mary Moss MRN: [id number] Date of Birth: 02-Sep-1935 Referring Provider (PT): Earnie Larsson MD   Encounter Date: 12/15/2018  PT End of Session - 12/15/18 1603    Visit Number  3    Number of Visits  12    Date for PT Re-Evaluation  03/08/19    Authorization Type  FOTO AT LEAST EVERY 5TH VISIT.  KX MODIFIER AFTER 15 VISITS.  PROGRESS NOTE AT 10TH VISIT.    PT Start Time  0233    PT Stop Time  0337    PT Time Calculation (min)  64 min    Activity Tolerance  Patient tolerated treatment well       Past Medical History:  Diagnosis Date  . Ankle fracture fx 13 yrs ago, anklw swells off and on, has cramps in ankle also   left, to seee dr hewitt about 02-16-14  . Arthritis   . Basal cell carcinoma 2015   Nose  . GERD (gastroesophageal reflux disease)    OTC med  . Hypertension   . Pneumonia     Past Surgical History:  Procedure Laterality Date  . ABDOMINAL HYSTERECTOMY    . EYE SURGERY Bilateral    bilaterla cataract extraction with IOL  . FRACTURE SURGERY Left 2001  . JOINT REPLACEMENT    . KNEE ARTHROSCOPY Right 02/07/2014   Procedure: RIGHT ARTHROSCOPY KNEE, WITH MEDIAL MENISCAL DEBRIDEMENT AND CHONDRAPLASTY;  Surgeon: Gearlean Alf, MD;  [facility];  Service: Orthopedics;  Laterality: Right;  . ROTATOR CUFF REPAIR Right 2009  . TOTAL ANKLE ARTHROPLASTY Left 12/20/2014   Procedure: LEFT TOTAL ANKLE ARTHOPLASTY WITH PERCUTANEOUS ACHILLES TENDON LENGTHENING;  Surgeon: Wylene Simmer, MD;  Location: [facility];  Service: Orthopedics;  Laterality: Left;  . TOTAL KNEE ARTHROPLASTY Right 06/08/2016   Procedure: RIGHT TOTAL KNEE ARTHROPLASTY;  Surgeon: Gaynelle Arabian, MD;  [facility]: [facility];  Service: Orthopedics;  Laterality: Right;    There were no vitals filed for this  visit.  Subjective Assessment - 12/15/18 1538    Subjective  COVID-19 screen performed prior to patient entering clinic.  My back feels so much better.  Morre sore than pain.    Pertinent History  HTN, Arthritis, right ankle replacement; right RTC repair and right TKA.    How long can you sit comfortably?  <10 minutes.    How long can you stand comfortably?  <10 minutes.    How long can you walk comfortably?  Around home.    Patient Stated Goals  Get out of pain.    Currently in Pain?  Yes    Pain Score  2     Pain Location  Back    Pain Orientation  Left    Pain Descriptors / Indicators  Sore    Pain Onset  More than a month ago                       [facility] Adult PT Treatment/Exercise - 12/15/18 0001      Modalities   Modalities  Electrical Stimulation;Moist Heat;Ultrasound      Moist Heat Therapy   Number Minutes Moist Heat  20 Minutes    Moist Heat Location  Lumbar Spine      Electrical Stimulation   Electrical Stimulation Location  Left LB/SIJ    Electrical  Stimulation Action  Pre-mod.    Electrical Stimulation Parameters  80-150 Hz x 20 minutes.    Electrical Stimulation Goals  Pain      Ultrasound   Ultrasound Location  Left SIJ/left low back     Ultrasound Parameters  Combo e'stim/U/S at 1.50 W/CM2 x 12 minutes.    Ultrasound Goals  Pain      Manual Therapy   Manual Therapy  Soft tissue mobilization    Soft tissue mobilization  Right sdly position with folded pillow between knees:  STW/M to left SIJ and included left QL release technique (15 minutes).               PT Short Term Goals - 12/08/18 1606      PT SHORT TERM GOAL #1   Title  STG's=LTG's.        PT Long Term Goals - 12/08/18 1606      PT LONG TERM GOAL #1   Title  Independent with an HEP.    Time  8    Period  Weeks    Status  New      PT LONG TERM GOAL #2   Title  Patient sit 30 minutes with pain not > 3/10.    Time  8    Period  Weeks    Status  New      PT  LONG TERM GOAL #3   Title  Stand 20 minutes with pain not > 3/10.    Time  8    Period  Weeks    Status  New      PT LONG TERM GOAL #4   Title  Eliminate left LE symptoms.    Time  8    Period  Weeks    Status  New      PT LONG TERM GOAL #5   Title  Perform ADL's with pain not > 3/10.    Time  8    Period  Weeks    Status  New            Plan - 12/15/18 1614    Clinical Impression Statement  Patient has made excellent progress over the last 3 visits with a significant improvement in her FOTO sore.  She states she is more sore now than painful.  Her left QL had increased tone in it and responded well to STW/M.    Examination-Activity Limitations  Bathing;Bed Mobility;Bend    Stability/Clinical Decision Making  Evolving/Moderate complexity    Rehab Potential  Excellent    PT Frequency  2x / week    PT Duration  6 weeks    PT Treatment/Interventions  ADLs/Self Care Home Management;Cryotherapy;Electrical Stimulation;Therapeutic activities;Therapeutic exercise;Patient/family education;Manual techniques    PT Next Visit Plan  STW/M and combo e'stim/U/S; SKTC and hip bridges.  HMP/E'stim.    Consulted and Agree with Plan of Care  Patient       Patient will benefit from skilled therapeutic intervention in order to improve the following deficits and impairments:  Abnormal gait, Pain, Decreased range of motion, Decreased strength, Postural dysfunction  Visit Diagnosis: Acute left-sided low back pain with left-sided sciatica  Abnormal posture     Problem List Patient Active Problem List   Diagnosis Date Noted  . Morbid obesity ([place]) 11/18/2018  . OA (osteoarthritis) of knee 06/08/2016  . Arthritis of knee, right 01/31/2016  . Hyperlipidemia 09/02/2015  . Pre-diabetes 07/12/2015  . Essential hypertension 07/03/2015  . Post-traumatic arthritis of ankle 12/20/2014  .  Acute medial meniscal tear 02/06/2014  . Low bone mass 12/14/2012  . GAD (generalized anxiety disorder)  12/14/2012  . Vitamin D deficiency 12/14/2012    Mary Moss, [place] MPT 12/15/2018, 4:24 PM  [facility] [address] [place], [place], [address] Phone: [phone]   Fax:  [phone]  Name: Mary Moss MRN: [id number] Date of Birth: 12/09/1935

## 2018-12-20 ENCOUNTER — Ambulatory Visit: Payer: Medicare Other | Admitting: Physical Therapy

## 2018-12-20 ENCOUNTER — Other Ambulatory Visit: Payer: Self-pay

## 2018-12-20 DIAGNOSIS — M5442 Lumbago with sciatica, left side: Secondary | ICD-10-CM | POA: Diagnosis not present

## 2018-12-20 DIAGNOSIS — R293 Abnormal posture: Secondary | ICD-10-CM

## 2018-12-20 NOTE — Therapy (Signed)
[facility], [place], [address] Phone: [phone]   Fax:  [phone]  Physical Therapy Treatment  Patient Details  Name: Mary Moss MRN: [id number] Date of Birth: [birth year] Referring Provider (PT): Earnie Larsson MD   Encounter Date: 12/20/2018  PT End of Session - 12/20/18 0919    Visit Number  4    Number of Visits  12    Date for PT Re-Evaluation  03/08/19    Authorization Type  FOTO AT LEAST EVERY 5TH VISIT.  KX MODIFIER AFTER 15 VISITS.  PROGRESS NOTE AT 10TH VISIT.    PT Start Time  0813    PT Stop Time  0911    PT Time Calculation (min)  58 min    Activity Tolerance  Patient tolerated treatment well    Behavior During Therapy  [facility] for tasks assessed/performed       Past Medical History:  Diagnosis Date  . Ankle fracture fx 13 yrs ago, anklw swells off and on, has cramps in ankle also   left, to seee dr hewitt about 02-16-14  . Arthritis   . Basal cell carcinoma 2015   Nose  . GERD (gastroesophageal reflux disease)    OTC med  . Hypertension   . Pneumonia     Past Surgical History:  Procedure Laterality Date  . ABDOMINAL HYSTERECTOMY    . EYE SURGERY Bilateral    bilaterla cataract extraction with IOL  . FRACTURE SURGERY Left 2001  . JOINT REPLACEMENT    . KNEE ARTHROSCOPY Right 02/07/2014   Procedure: RIGHT ARTHROSCOPY KNEE, WITH MEDIAL MENISCAL DEBRIDEMENT AND CHONDRAPLASTY;  Surgeon: Gearlean Alf, MD;  [facility]: [facility];  Service: Orthopedics;  Laterality: Right;  . ROTATOR CUFF REPAIR Right 2009  . TOTAL ANKLE ARTHROPLASTY Left 12/20/2014   Procedure: LEFT TOTAL ANKLE ARTHOPLASTY WITH PERCUTANEOUS ACHILLES TENDON LENGTHENING;  Surgeon: Wylene Simmer, MD;  Location: [place];  Service: Orthopedics;  Laterality: Left;  . TOTAL KNEE ARTHROPLASTY Right 06/08/2016   Procedure: RIGHT TOTAL KNEE ARTHROPLASTY;  Surgeon: Gaynelle Arabian, MD;  [facility];  Service: Orthopedics;  Laterality:  Right;    There were no vitals filed for this visit.  Subjective Assessment - 12/20/18 0905    Subjective  COVID-19 screen performed prior to patient entering clinic. Back hurt over the weekend but okay today.    Currently in Pain?  Yes    Pain Score  2     Pain Location  Back    Pain Orientation  Left    Pain Descriptors / Indicators  Sore    Pain Type  Acute pain    Pain Onset  More than a month ago                       [facility] Adult PT Treatment/Exercise - 12/20/18 0001      Modalities   Modalities  Electrical Stimulation;Moist Heat;Ultrasound      Moist Heat Therapy   Number Minutes Moist Heat  20 Minutes    Moist Heat Location  Lumbar Spine      Electrical Stimulation   Electrical Stimulation Location  Left LB/SIJ    Electrical Stimulation Action  Pre-mod.    Electrical Stimulation Parameters  80-150 Hz x 20 minutes.    Electrical Stimulation Goals  Pain      Ultrasound   Ultrasound Location  Left SIJ/Low back.    Ultrasound Parameters  Combo e'stim/U/S at 1.50 W/CM2  x 12 minutes.    Ultrasound Goals  Pain      Manual Therapy   Manual Therapy  Soft tissue mobilization    Soft tissue mobilization  Right sdly position with folded pillow between knees for comfort:  STW/M to left SIJ and QL to reduce tone x 12  minutes.               PT Short Term Goals - 12/08/18 1606      PT SHORT TERM GOAL #1   Title  STG's=LTG's.        PT Long Term Goals - 12/08/18 1606      PT LONG TERM GOAL #1   Title  Independent with an HEP.    Time  8    Period  Weeks    Status  New      PT LONG TERM GOAL #2   Title  Patient sit 30 minutes with pain not > 3/10.    Time  8    Period  Weeks    Status  New      PT LONG TERM GOAL #3   Title  Stand 20 minutes with pain not > 3/10.    Time  8    Period  Weeks    Status  New      PT LONG TERM GOAL #4   Title  Eliminate left LE symptoms.    Time  8    Period  Weeks    Status  New      PT LONG TERM  GOAL #5   Title  Perform ADL's with pain not > 3/10.    Time  8    Period  Weeks    Status  New            Plan - 12/20/18 0910    Clinical Impression Statement  Patient did have a flare-up last weekend which she thinks in past was due to the "damp" weather.  She is doing much better today.  She had increased tone in her left QL that responded well to STW/M.    PT Treatment/Interventions  ADLs/Self Care Home Management;Cryotherapy;Electrical Stimulation;Therapeutic activities;Therapeutic exercise;Patient/family education;Manual techniques    PT Next Visit Plan  STW/M and combo e'stim/U/S; SKTC and hip bridges.  HMP/E'stim.       Patient will benefit from skilled therapeutic intervention in order to improve the following deficits and impairments:  Abnormal gait, Pain, Decreased range of motion, Decreased strength, Postural dysfunction  Visit Diagnosis: Acute left-sided low back pain with left-sided sciatica  Abnormal posture     Problem List Patient Active Problem List   Diagnosis Date Noted  . Morbid obesity ([place]) 11/18/2018  . OA (osteoarthritis) of knee 06/08/2016  . Arthritis of knee, right 01/31/2016  . Hyperlipidemia 09/02/2015  . Pre-diabetes 07/12/2015  . Essential hypertension 07/03/2015  . Post-traumatic arthritis of ankle 12/20/2014  . Acute medial meniscal tear 02/06/2014  . Low bone mass 12/14/2012  . GAD (generalized anxiety disorder) 12/14/2012  . Vitamin D deficiency 12/14/2012    Kweku Stankey, [place] MPT 12/20/2018, 9:20 AM  [facility] [address] [place], [place], [address] Phone: [phone]   Fax:  [phone]  Name: Mary Moss MRN: [id number] Date of Birth: Jun 14, 1936

## 2018-12-22 ENCOUNTER — Other Ambulatory Visit: Payer: Self-pay

## 2018-12-22 ENCOUNTER — Encounter: Payer: Self-pay | Admitting: Physical Therapy

## 2018-12-22 ENCOUNTER — Ambulatory Visit: Payer: Medicare Other | Admitting: Physical Therapy

## 2018-12-22 DIAGNOSIS — M5442 Lumbago with sciatica, left side: Secondary | ICD-10-CM

## 2018-12-22 DIAGNOSIS — R293 Abnormal posture: Secondary | ICD-10-CM | POA: Diagnosis not present

## 2018-12-22 NOTE — Therapy (Signed)
[facility] [place], [place], [address] Phone: [phone]   Fax:  [phone]  Physical Therapy Treatment  Patient Details  Name: Mary Moss MRN: [id number] Date of Birth: 28-Oct-1935 Referring Provider (PT): Earnie Larsson MD   Encounter Date: 12/22/2018  PT End of Session - 12/22/18 0814    Visit Number  5    Number of Visits  12    Date for PT Re-Evaluation  03/08/19    Authorization Type  FOTO AT LEAST EVERY 5TH VISIT.  KX MODIFIER AFTER 15 VISITS.  PROGRESS NOTE AT 10TH VISIT.    PT Start Time  [phone]    PT Stop Time  0900    PT Time Calculation (min)  46 min    Activity Tolerance  Patient tolerated treatment well    Behavior During Therapy  W[facility] for tasks assessed/performed       Past Medical History:  Diagnosis Date  . Ankle fracture fx 13 yrs ago, anklw swells off and on, has cramps in ankle also   left, to seee dr hewitt about 02-16-14  . Arthritis   . Basal cell carcinoma 2015   Nose  . GERD (gastroesophageal reflux disease)    OTC med  . Hypertension   . Pneumonia     Past Surgical History:  Procedure Laterality Date  . ABDOMINAL HYSTERECTOMY    . EYE SURGERY Bilateral    bilaterla cataract extraction with IOL  . FRACTURE SURGERY Left 2001  . JOINT REPLACEMENT    . KNEE ARTHROSCOPY Right 02/07/2014   Procedure: RIGHT ARTHROSCOPY KNEE, WITH MEDIAL MENISCAL DEBRIDEMENT AND CHONDRAPLASTY;  Surgeon: Gearlean Alf, MD;  [facility];  Service: Orthopedics;  Laterality: Right;  . ROTATOR CUFF REPAIR Right 2009  . TOTAL ANKLE ARTHROPLASTY Left 12/20/2014   Procedure: LEFT TOTAL ANKLE ARTHOPLASTY WITH PERCUTANEOUS ACHILLES TENDON LENGTHENING;  Surgeon: Wylene Simmer, MD;  Location: [place];  Service: Orthopedics;  Laterality: Left;  . TOTAL KNEE ARTHROPLASTY Right 06/08/2016   Procedure: RIGHT TOTAL KNEE ARTHROPLASTY;  Surgeon: Gaynelle Arabian, MD;  [facility];  Service: Orthopedics;  Laterality:  Right;    There were no vitals filed for this visit.  Subjective Assessment - 12/22/18 0812    Subjective  COVID 19 screening performed on patient entering building. Not having pain just having an ache.    Pertinent History  HTN, Arthritis, right ankle replacement; right RTC repair and right TKA.    How long can you sit comfortably?  <10 minutes.    How long can you stand comfortably?  <10 minutes.    How long can you walk comfortably?  Around home.    Diagnostic tests  MRI.    Patient Stated Goals  Get out of pain.    Currently in Pain?  Yes    Pain Score  3     Pain Location  Back    Pain Orientation  Left    Pain Descriptors / Indicators  Aching    Pain Type  Acute pain    Pain Onset  More than a month ago    Pain Frequency  Constant         [facility] PT Assessment - 12/22/18 0001      Assessment   Medical Diagnosis  Radiculopathy, Lumbar.    Referring Provider (PT)  Earnie Larsson MD      Precautions   Precautions  None      Restrictions   Weight Bearing Restrictions  No                   [facility] Adult PT Treatment/Exercise - 12/22/18 0001      Modalities   Modalities  Electrical Stimulation;Moist Heat;Ultrasound      Moist Heat Therapy   Number Minutes Moist Heat  15 Minutes    Moist Heat Location  Lumbar Spine      Electrical Stimulation   Electrical Stimulation Location  L low back/ SI joint    Electrical Stimulation Action  Pre-Mod    Electrical Stimulation Parameters  80-150 hz x15 min    Electrical Stimulation Goals  Pain      Ultrasound   Ultrasound Location  L SI joint/lumbar paraspinals    Ultrasound Parameters  1.5 w/cm2, 100%, 1 mhz x10 min    Ultrasound Goals  Pain      Manual Therapy   Manual Therapy  Soft tissue mobilization    Soft tissue mobilization  STW to L lumbar paraspinals, glute, SI joint to reduce pain and limitations               PT Short Term Goals - 12/08/18 1606      PT SHORT TERM GOAL #1   Title   STG's=LTG's.        PT Long Term Goals - 12/22/18 0905      PT LONG TERM GOAL #1   Title  Independent with an HEP.    Time  8    Period  Weeks    Status  Achieved   per MPT verbal HEP     PT LONG TERM GOAL #2   Title  Patient sit 30 minutes with pain not > 3/10.    Time  8    Period  Weeks    Status  On-going      PT LONG TERM GOAL #3   Title  Stand 20 minutes with pain not > 3/10.    Time  8    Period  Weeks    Status  On-going      PT LONG TERM GOAL #4   Title  Eliminate left LE symptoms.    Time  8    Period  Weeks    Status  On-going      PT LONG TERM GOAL #5   Title  Perform ADL's with pain not > 3/10.    Time  8    Period  Weeks    Status  On-going            Plan - 12/22/18 0855    Clinical Impression Statement  Patient presented in clinic today with low level L low back ache today. Moderate tightness palpable in L lumbar paraspinals upon palpation. No complaints of any tenderness or discomfort during any aspect of the treatment session. Normal modalities response noted following removal of the modalities. Patient still hesitant with any ADLs such as cleaning secondary to discomfort and slow with transition movements. Patient compliant with verbal instructions from MPT regarding HEP.    Personal Factors and Comorbidities  Age    Examination-Activity Limitations  Bathing;Bed Mobility;Bend    Stability/Clinical Decision Making  Evolving/Moderate complexity    Rehab Potential  Excellent    PT Frequency  2x / week    PT Duration  6 weeks    PT Treatment/Interventions  ADLs/Self Care Home Management;Cryotherapy;Electrical Stimulation;Therapeutic activities;Therapeutic exercise;Patient/family education;Manual techniques    PT Next Visit Plan  STW/M and combo e'stim/U/S; SKTC and  hip bridges.  HMP/E'stim.    Consulted and Agree with Plan of Care  Patient       Patient will benefit from skilled therapeutic intervention in order to improve the following  deficits and impairments:  Abnormal gait, Pain, Decreased range of motion, Decreased strength, Postural dysfunction  Visit Diagnosis: Acute left-sided low back pain with left-sided sciatica  Abnormal posture     Problem List Patient Active Problem List   Diagnosis Date Noted  . Morbid obesity ([place]) 11/18/2018  . OA (osteoarthritis) of knee 06/08/2016  . Arthritis of knee, right 01/31/2016  . Hyperlipidemia 09/02/2015  . Pre-diabetes 07/12/2015  . Essential hypertension 07/03/2015  . Post-traumatic arthritis of ankle 12/20/2014  . Acute medial meniscal tear 02/06/2014  . Low bone mass 12/14/2012  . GAD (generalized anxiety disorder) 12/14/2012  . Vitamin D deficiency 12/14/2012    Standley Brooking, PTA 12/22/2018, 9:07 AM  [facility] [address] [place], [place], [address] Phone: [phone]   Fax:  [phone]  Name: Mary Moss MRN: [id number] Date of Birth: [birth year]

## 2018-12-27 ENCOUNTER — Ambulatory Visit: Payer: Medicare Other | Attending: Neurosurgery | Admitting: Physical Therapy

## 2018-12-27 ENCOUNTER — Other Ambulatory Visit: Payer: Self-pay

## 2018-12-27 DIAGNOSIS — M5442 Lumbago with sciatica, left side: Secondary | ICD-10-CM | POA: Diagnosis not present

## 2018-12-27 DIAGNOSIS — R293 Abnormal posture: Secondary | ICD-10-CM | POA: Insufficient documentation

## 2018-12-27 NOTE — Therapy (Signed)
[facility] [place], [place], [address] Phone: [phone]   Fax:  [phone]  Physical Therapy Treatment  Patient Details  Name: Mary Moss MRN: [id number] Date of Birth: Dec 19, 1935 Referring Provider (PT): Earnie Larsson MD   Encounter Date: 12/27/2018  PT End of Session - 12/27/18 1136    Visit Number  6    Number of Visits  12    Date for PT Re-Evaluation  03/08/19    Authorization Type  FOTO AT LEAST EVERY 5TH VISIT.  KX MODIFIER AFTER 15 VISITS.  PROGRESS NOTE AT 10TH VISIT.    PT Start Time  0815    PT Stop Time  0904    PT Time Calculation (min)  49 min    Activity Tolerance  Patient tolerated treatment well    Behavior During Therapy  [facility] for tasks assessed/performed       Past Medical History:  Diagnosis Date  . Ankle fracture fx 13 yrs ago, anklw swells off and on, has cramps in ankle also   left, to seee dr hewitt about 02-16-14  . Arthritis   . Basal cell carcinoma 2015   Nose  . GERD (gastroesophageal reflux disease)    OTC med  . Hypertension   . Pneumonia     Past Surgical History:  Procedure Laterality Date  . ABDOMINAL HYSTERECTOMY    . EYE SURGERY Bilateral    bilaterla cataract extraction with IOL  . FRACTURE SURGERY Left 2001  . JOINT REPLACEMENT    . KNEE ARTHROSCOPY Right 02/07/2014   Procedure: RIGHT ARTHROSCOPY KNEE, WITH MEDIAL MENISCAL DEBRIDEMENT AND CHONDRAPLASTY;  Surgeon: Gearlean Alf, MD;  [facility];  Service: Orthopedics;  Laterality: Right;  . ROTATOR CUFF REPAIR Right 2009  . TOTAL ANKLE ARTHROPLASTY Left 12/20/2014   Procedure: LEFT TOTAL ANKLE ARTHOPLASTY WITH PERCUTANEOUS ACHILLES TENDON LENGTHENING;  Surgeon: Wylene Simmer, MD;  Location: [place];  Service: Orthopedics;  Laterality: Left;  . TOTAL KNEE ARTHROPLASTY Right 06/08/2016   Procedure: RIGHT TOTAL KNEE ARTHROPLASTY;  Surgeon: Gaynelle Arabian, MD;  [facility];  Service: Orthopedics;  Laterality:  Right;    There were no vitals filed for this visit.  Subjective Assessment - 12/27/18 1132    Subjective  COVID-19 screen performed prior to patient entering clinic.  I'm doing much better.    Patient Stated Goals  Get out of pain.    Currently in Pain?  Yes    Pain Score  1     Pain Location  Back    Pain Orientation  Left    Pain Onset  More than a month ago                       [facility] Adult PT Treatment/Exercise - 12/27/18 0001      Exercises   Exercises  Knee/Hip      Knee/Hip Exercises: Aerobic   Nustep  Level 3 x 11 minutes (LE's only).      Modalities   Modalities  Electrical Stimulation;Moist Heat      Moist Heat Therapy   Number Minutes Moist Heat  20 Minutes    Moist Heat Location  Lumbar Spine      Electrical Stimulation   Electrical Stimulation Location  Left LB/SIJ    Electrical Stimulation Action  Pre-mod.    Electrical Stimulation Parameters  80-150 Hz x 20 minutes.    Electrical Stimulation Goals  Pain  Manual Therapy   Manual Therapy  Soft tissue mobilization    Soft tissue mobilization  STW/M x 12 minutes to patient's left SIJ and also trigger point release to left QL.               PT Short Term Goals - 12/08/18 1606      PT SHORT TERM GOAL #1   Title  STG's=LTG's.        PT Long Term Goals - 12/22/18 0905      PT LONG TERM GOAL #1   Title  Independent with an HEP.    Time  8    Period  Weeks    Status  Achieved   per MPT verbal HEP     PT LONG TERM GOAL #2   Title  Patient sit 30 minutes with pain not > 3/10.    Time  8    Period  Weeks    Status  On-going      PT LONG TERM GOAL #3   Title  Stand 20 minutes with pain not > 3/10.    Time  8    Period  Weeks    Status  On-going      PT LONG TERM GOAL #4   Title  Eliminate left LE symptoms.    Time  8    Period  Weeks    Status  On-going      PT LONG TERM GOAL #5   Title  Perform ADL's with pain not > 3/10.    Time  8    Period  Weeks     Status  On-going            Plan - 12/27/18 1135    Clinical Impression Statement  Excellent response to treatment with no pain reported following.    PT Next Visit Plan  STW/M and combo e'stim/U/S; SKTC and hip bridges.  HMP/E'stim.    Consulted and Agree with Plan of Care  Patient       Patient will benefit from skilled therapeutic intervention in order to improve the following deficits and impairments:     Visit Diagnosis: Acute left-sided low back pain with left-sided sciatica  Abnormal posture     Problem List Patient Active Problem List   Diagnosis Date Noted  . Morbid obesity ([place]) 11/18/2018  . OA (osteoarthritis) of knee 06/08/2016  . Arthritis of knee, right 01/31/2016  . Hyperlipidemia 09/02/2015  . Pre-diabetes 07/12/2015  . Essential hypertension 07/03/2015  . Post-traumatic arthritis of ankle 12/20/2014  . Acute medial meniscal tear 02/06/2014  . Low bone mass 12/14/2012  . GAD (generalized anxiety disorder) 12/14/2012  . Vitamin D deficiency 12/14/2012    Emalie Mcwethy, [place] MPT 12/27/2018, 11:37 AM  [facility] [address] [place], [place], [address] Phone: [phone]   Fax:  [phone]  Name: Mary Moss MRN: [id number] Date of Birth: 11/18/1935

## 2018-12-29 ENCOUNTER — Ambulatory Visit: Payer: Medicare Other | Admitting: Physical Therapy

## 2018-12-29 ENCOUNTER — Encounter: Payer: Self-pay | Admitting: Physical Therapy

## 2018-12-29 ENCOUNTER — Other Ambulatory Visit: Payer: Self-pay

## 2018-12-29 DIAGNOSIS — R293 Abnormal posture: Secondary | ICD-10-CM | POA: Diagnosis not present

## 2018-12-29 DIAGNOSIS — M5442 Lumbago with sciatica, left side: Secondary | ICD-10-CM | POA: Diagnosis not present

## 2018-12-29 NOTE — Therapy (Signed)
[facility] [place], [place], [address] Phone: [phone]   Fax:  [phone]  Physical Therapy Treatment  Patient Details  Name: Mary Moss MRN: [id number] Date of Birth: 1935/10/18 Referring Provider (PT): Earnie Larsson MD   Encounter Date: 12/29/2018  PT End of Session - 12/29/18 0916    Visit Number  7    Number of Visits  12    Date for PT Re-Evaluation  03/08/19    Authorization Type  FOTO AT LEAST EVERY 5TH VISIT.  KX MODIFIER AFTER 15 VISITS.  PROGRESS NOTE AT 10TH VISIT.    PT Start Time  0912    PT Stop Time  1004    PT Time Calculation (min)  52 min    Activity Tolerance  Patient tolerated treatment well    Behavior During Therapy  WFL for tasks assessed/performed       Past Medical History:  Diagnosis Date  . Ankle fracture fx 13 yrs ago, anklw swells off and on, has cramps in ankle also   left, to seee dr hewitt about 02-16-14  . Arthritis   . Basal cell carcinoma 2015   Nose  . GERD (gastroesophageal reflux disease)    OTC med  . Hypertension   . Pneumonia     Past Surgical History:  Procedure Laterality Date  . ABDOMINAL HYSTERECTOMY    . EYE SURGERY Bilateral    bilaterla cataract extraction with IOL  . FRACTURE SURGERY Left 2001  . JOINT REPLACEMENT    . KNEE ARTHROSCOPY Right 02/07/2014   Procedure: RIGHT ARTHROSCOPY KNEE, WITH MEDIAL MENISCAL DEBRIDEMENT AND CHONDRAPLASTY;  Surgeon: Gearlean Alf, MD;  [facility];  Service: Orthopedics;  Laterality: Right;  . ROTATOR CUFF REPAIR Right 2009  . TOTAL ANKLE ARTHROPLASTY Left 12/20/2014   Procedure: LEFT TOTAL ANKLE ARTHOPLASTY WITH PERCUTANEOUS ACHILLES TENDON LENGTHENING;  Surgeon: Wylene Simmer, MD;  Location: [place];  Service: Orthopedics;  Laterality: Left;  . TOTAL KNEE ARTHROPLASTY Right 06/08/2016   Procedure: RIGHT TOTAL KNEE ARTHROPLASTY;  Surgeon: Gaynelle Arabian, MD;  [facility];  Service: Orthopedics;  Laterality:  Right;    There were no vitals filed for this visit.  Subjective Assessment - 12/29/18 0913    Subjective  COVID 19 screening performed on patient upon arrival. Reports that she had sharp twinges of pain down LLE yesterday afternoon. Reports that this morning her back is okay though.    Pertinent History  HTN, Arthritis, right ankle replacement; right RTC repair and right TKA.    How long can you sit comfortably?  <10 minutes.    How long can you stand comfortably?  <10 minutes.    How long can you walk comfortably?  Around home.    Diagnostic tests  MRI.    Patient Stated Goals  Get out of pain.    Currently in Pain?  Yes    Pain Score  --   No pain score provided by patient   Pain Location  Back    Pain Orientation  Left;Lower    Pain Descriptors / Indicators  Aching    Pain Type  Acute pain    Pain Onset  More than a month ago    Pain Frequency  Intermittent         [facility] PT Assessment - 12/29/18 0001      Assessment   Medical Diagnosis  Radiculopathy, Lumbar.    Referring Provider (PT)  Earnie Larsson MD  Next MD Visit  01/13/2019      Precautions   Precautions  None      Restrictions   Weight Bearing Restrictions  No                   [facility] Adult PT Treatment/Exercise - 12/29/18 0001      Knee/Hip Exercises: Aerobic   Nustep  L4 x10 min      Modalities   Modalities  Electrical Stimulation;Moist Heat;Ultrasound      Moist Heat Therapy   Number Minutes Moist Heat  15 Minutes    Moist Heat Location  Lumbar Spine      Electrical Stimulation   Electrical Stimulation Location  L glute/ low back    Electrical Stimulation Action  Pre-Mod    Electrical Stimulation Parameters  80-150 hz x15 min    Electrical Stimulation Goals  Pain      Ultrasound   Ultrasound Location  L piriformis     Ultrasound Parameters  1.5 w/cm2, 100%, 1 mhz x10 min    Ultrasound Goals  Pain      Manual Therapy   Manual Therapy  Soft tissue mobilization    Soft tissue  mobilization  STW to L piriformis and glute to reduce muscle tightness                PT Short Term Goals - 12/08/18 1606      PT SHORT TERM GOAL #1   Title  STG's=LTG's.        PT Long Term Goals - 12/22/18 0905      PT LONG TERM GOAL #1   Title  Independent with an HEP.    Time  8    Period  Weeks    Status  Achieved   per MPT verbal HEP     PT LONG TERM GOAL #2   Title  Patient sit 30 minutes with pain not > 3/10.    Time  8    Period  Weeks    Status  On-going      PT LONG TERM GOAL #3   Title  Stand 20 minutes with pain not > 3/10.    Time  8    Period  Weeks    Status  On-going      PT LONG TERM GOAL #4   Title  Eliminate left LE symptoms.    Time  8    Period  Weeks    Status  On-going      PT LONG TERM GOAL #5   Title  Perform ADL's with pain not > 3/10.    Time  8    Period  Weeks    Status  On-going            Plan - 12/29/18 0955    Clinical Impression Statement  Patient tolerated today's treatment well with low level LBP today. Patient still intermittantly has LLE radicular symptoms for unknown reason as she is limiting herself with cleaning/ADLs. Patient still tender to palpation and STW to L piriformis and glute. Normal modalities response noted following removal of the modalities.    Personal Factors and Comorbidities  Age    Examination-Activity Limitations  Bathing;Bed Mobility;Bend    Stability/Clinical Decision Making  Evolving/Moderate complexity    Rehab Potential  Excellent    PT Frequency  2x / week    PT Duration  6 weeks    PT Treatment/Interventions  ADLs/Self Care Home Management;Cryotherapy;Electrical Stimulation;Therapeutic activities;Therapeutic exercise;Patient/family  education;Manual techniques    PT Next Visit Plan  STW/M and combo e'stim/U/S; SKTC and hip bridges.  HMP/E'stim.    Consulted and Agree with Plan of Care  Patient       Patient will benefit from skilled therapeutic intervention in order to improve  the following deficits and impairments:  Abnormal gait, Pain, Decreased range of motion, Decreased strength, Postural dysfunction  Visit Diagnosis: Acute left-sided low back pain with left-sided sciatica  Abnormal posture     Problem List Patient Active Problem List   Diagnosis Date Noted  . Morbid obesity ([place]) 11/18/2018  . OA (osteoarthritis) of knee 06/08/2016  . Arthritis of knee, right 01/31/2016  . Hyperlipidemia 09/02/2015  . Pre-diabetes 07/12/2015  . Essential hypertension 07/03/2015  . Post-traumatic arthritis of ankle 12/20/2014  . Acute medial meniscal tear 02/06/2014  . Low bone mass 12/14/2012  . GAD (generalized anxiety disorder) 12/14/2012  . Vitamin D deficiency 12/14/2012    Standley Brooking, PTA 12/29/2018, 10:08 AM  [facility] [address] [place], [place], [address] Phone: [phone]   Fax:  [phone]  Name: JARAH PEMBER MRN: [id number] Date of Birth: June 03, 1936

## 2019-01-03 ENCOUNTER — Encounter: Payer: Self-pay | Admitting: Physical Therapy

## 2019-01-03 ENCOUNTER — Other Ambulatory Visit: Payer: Self-pay

## 2019-01-03 ENCOUNTER — Ambulatory Visit: Payer: Medicare Other | Admitting: Physical Therapy

## 2019-01-03 DIAGNOSIS — R293 Abnormal posture: Secondary | ICD-10-CM | POA: Diagnosis not present

## 2019-01-03 DIAGNOSIS — M5442 Lumbago with sciatica, left side: Secondary | ICD-10-CM

## 2019-01-03 NOTE — Therapy (Signed)
[facility] [place], [place], [address] Phone: [phone]   Fax:  [phone]  Physical Therapy Treatment  Patient Details  Name: Mary Moss MRN: [id number] Date of Birth: Feb 27, 1936 Referring Provider (PT): Earnie Larsson MD   Encounter Date: 01/03/2019  PT End of Session - 01/03/19 0948    Visit Number  8    Number of Visits  12    Date for PT Re-Evaluation  03/08/19    Authorization Type  FOTO AT LEAST EVERY 5TH VISIT.  KX MODIFIER AFTER 15 VISITS.  PROGRESS NOTE AT 10TH VISIT.    PT Start Time  0815    PT Stop Time  0914    PT Time Calculation (min)  59 min    Activity Tolerance  Patient tolerated treatment well    Behavior During Therapy  [facility] for tasks assessed/performed       Past Medical History:  Diagnosis Date  . Ankle fracture fx 13 yrs ago, anklw swells off and on, has cramps in ankle also   left, to seee dr hewitt about 02-16-14  . Arthritis   . Basal cell carcinoma 2015   Nose  . GERD (gastroesophageal reflux disease)    OTC med  . Hypertension   . Pneumonia     Past Surgical History:  Procedure Laterality Date  . ABDOMINAL HYSTERECTOMY    . EYE SURGERY Bilateral    bilaterla cataract extraction with IOL  . FRACTURE SURGERY Left 2001  . JOINT REPLACEMENT    . KNEE ARTHROSCOPY Right 02/07/2014   Procedure: RIGHT ARTHROSCOPY KNEE, WITH MEDIAL MENISCAL DEBRIDEMENT AND CHONDRAPLASTY;  Surgeon: Gearlean Alf, MD;  Location: [facility];  Service: Orthopedics;  Laterality: Right;  . ROTATOR CUFF REPAIR Right 2009  . TOTAL ANKLE ARTHROPLASTY Left 12/20/2014   Procedure: LEFT TOTAL ANKLE ARTHOPLASTY WITH PERCUTANEOUS ACHILLES TENDON LENGTHENING;  Surgeon: Wylene Simmer, MD;  Location: [place];  Service: Orthopedics;  Laterality: Left;  . TOTAL KNEE ARTHROPLASTY Right 06/08/2016   Procedure: RIGHT TOTAL KNEE ARTHROPLASTY;  Surgeon: Gaynelle Arabian, MD;  Location: [facility];  Service: Orthopedics;  Laterality:  Right;    There were no vitals filed for this visit.  Subjective Assessment - 01/03/19 0908    Subjective  COVID-19 screen performed prior to patient entering clinic.  I'm doing much better.  I get a catch sometimes but it's not like it was.    Pertinent History  HTN, Arthritis, right ankle replacement; right RTC repair and right TKA.    Patient Stated Goals  Get out of pain.    Currently in Pain?  Yes    Pain Score  1     Pain Location  Back    Pain Orientation  Left;Lower    Pain Descriptors / Indicators  Aching    Pain Onset  More than a month ago                       [facility] Adult PT Treatment/Exercise - 01/03/19 0001      Exercises   Exercises  Knee/Hip      Knee/Hip Exercises: Aerobic   Nustep  Level 4 x 15 minutes.      Modalities   Modalities  Electrical Stimulation;Moist Heat      Moist Heat Therapy   Number Minutes Moist Heat  20 Minutes    Moist Heat Location  Lumbar Spine      Electrical Stimulation   Electrical Stimulation  Location  Left LB/SIJ    Electrical Stimulation Action  Pre-mod.    Electrical Stimulation Parameters  80-150 Hz x 20 minutes.    Electrical Stimulation Goals  Tone;Pain      Manual Therapy   Manual Therapy  Soft tissue mobilization    Soft tissue mobilization  STW/M x 10 minutes to left lower lumbar region and SIJ.               PT Short Term Goals - 12/08/18 1606      PT SHORT TERM GOAL #1   Title  STG's=LTG's.        PT Long Term Goals - 12/22/18 0905      PT LONG TERM GOAL #1   Title  Independent with an HEP.    Time  8    Period  Weeks    Status  Achieved   per MPT verbal HEP     PT LONG TERM GOAL #2   Title  Patient sit 30 minutes with pain not > 3/10.    Time  8    Period  Weeks    Status  On-going      PT LONG TERM GOAL #3   Title  Stand 20 minutes with pain not > 3/10.    Time  8    Period  Weeks    Status  On-going      PT LONG TERM GOAL #4   Title  Eliminate left LE symptoms.     Time  8    Period  Weeks    Status  On-going      PT LONG TERM GOAL #5   Title  Perform ADL's with pain not > 3/10.    Time  8    Period  Weeks    Status  On-going            Plan - 01/03/19 0946    Clinical Impression Statement  Excellent response to treatments with no pain reported after treatments.  She reports some "catches" but much better than initially starting PT.    Personal Factors and Comorbidities  Age    Examination-Activity Limitations  Bathing;Bed Mobility;Bend    Stability/Clinical Decision Making  Evolving/Moderate complexity    Rehab Potential  Excellent    PT Frequency  2x / week    PT Duration  6 weeks    PT Treatment/Interventions  ADLs/Self Care Home Management;Cryotherapy;Electrical Stimulation;Therapeutic activities;Therapeutic exercise;Patient/family education;Manual techniques    PT Next Visit Plan  STW/M and combo e'stim/U/S; SKTC and hip bridges.  HMP/E'stim.    Consulted and Agree with Plan of Care  Patient       Patient will benefit from skilled therapeutic intervention in order to improve the following deficits and impairments:  Abnormal gait, Pain, Decreased range of motion, Decreased strength, Postural dysfunction  Visit Diagnosis: Acute left-sided low back pain with left-sided sciatica  Abnormal posture     Problem List Patient Active Problem List   Diagnosis Date Noted  . Morbid obesity ([place]) 11/18/2018  . OA (osteoarthritis) of knee 06/08/2016  . Arthritis of knee, right 01/31/2016  . Hyperlipidemia 09/02/2015  . Pre-diabetes 07/12/2015  . Essential hypertension 07/03/2015  . Post-traumatic arthritis of ankle 12/20/2014  . Acute medial meniscal tear 02/06/2014  . Low bone mass 12/14/2012  . GAD (generalized anxiety disorder) 12/14/2012  . Vitamin D deficiency 12/14/2012    Mary Moss, Mary Moss  MPT 01/03/2019, 9:50 AM  [facility] [place]  [place], [place], [address] Phone:  [phone]   Fax:  [phone]  Name: Mary Moss MRN: [id number] Date of Birth: [birth year]

## 2019-01-05 ENCOUNTER — Ambulatory Visit: Payer: Medicare Other | Admitting: Physical Therapy

## 2019-01-05 ENCOUNTER — Encounter: Payer: Self-pay | Admitting: Physical Therapy

## 2019-01-05 ENCOUNTER — Other Ambulatory Visit: Payer: Self-pay

## 2019-01-05 DIAGNOSIS — R293 Abnormal posture: Secondary | ICD-10-CM | POA: Diagnosis not present

## 2019-01-05 DIAGNOSIS — M5442 Lumbago with sciatica, left side: Secondary | ICD-10-CM | POA: Diagnosis not present

## 2019-01-05 NOTE — Therapy (Signed)
[facility] [place], [place], [address] Phone: [phone]   Fax:  [phone]  Physical Therapy Treatment  Patient Details  Name: Mary Moss MRN: [id number] Date of Birth: 04-16-1936 Referring Provider (PT): Earnie Larsson MD   Encounter Date: 01/05/2019  PT End of Session - 01/05/19 0817    Visit Number  9    Number of Visits  12    Date for PT Re-Evaluation  03/08/19    Authorization Type  FOTO AT LEAST EVERY 5TH VISIT.  KX MODIFIER AFTER 15 VISITS.  PROGRESS NOTE AT 10TH VISIT.    PT Start Time  0815    PT Stop Time  0902    PT Time Calculation (min)  47 min    Activity Tolerance  Patient tolerated treatment well    Behavior During Therapy  [facility] for tasks assessed/performed       Past Medical History:  Diagnosis Date  . Ankle fracture fx 13 yrs ago, anklw swells off and on, has cramps in ankle also   left, to seee dr hewitt about 02-16-14  . Arthritis   . Basal cell carcinoma 2015   Nose  . GERD (gastroesophageal reflux disease)    OTC med  . Hypertension   . Pneumonia     Past Surgical History:  Procedure Laterality Date  . ABDOMINAL HYSTERECTOMY    . EYE SURGERY Bilateral    bilaterla cataract extraction with IOL  . FRACTURE SURGERY Left 2001  . JOINT REPLACEMENT    . KNEE ARTHROSCOPY Right 02/07/2014   Procedure: RIGHT ARTHROSCOPY KNEE, WITH MEDIAL MENISCAL DEBRIDEMENT AND CHONDRAPLASTY;  Surgeon: Gearlean Alf, MD;  Location: [facility];  Service: Orthopedics;  Laterality: Right;  . ROTATOR CUFF REPAIR Right 2009  . TOTAL ANKLE ARTHROPLASTY Left 12/20/2014   Procedure: LEFT TOTAL ANKLE ARTHOPLASTY WITH PERCUTANEOUS ACHILLES TENDON LENGTHENING;  Surgeon: Wylene Simmer, MD;  Location: [place];  Service: Orthopedics;  Laterality: Left;  . TOTAL KNEE ARTHROPLASTY Right 06/08/2016   Procedure: RIGHT TOTAL KNEE ARTHROPLASTY;  Surgeon: Gaynelle Arabian, MD;  Location: [facility];  Service: Orthopedics;  Laterality:  Right;    There were no vitals filed for this visit.  Subjective Assessment - 01/05/19 0814    Subjective  COVID-19 screen performed prior to patient entering clinic. Patient reports feeling much better but states has some soreness but no pain.    Pertinent History  HTN, Arthritis, right ankle replacement; right RTC repair and right TKA.    How long can you sit comfortably?  <10 minutes.    How long can you stand comfortably?  <10 minutes.    How long can you walk comfortably?  Around home.    Diagnostic tests  MRI.    Patient Stated Goals  Get out of pain.    Currently in Pain?  Yes    Pain Score  4     Pain Location  Back    Pain Orientation  Left;Lower    Pain Descriptors / Indicators  Sore         [facility] PT Assessment - 01/05/19 0001      Assessment   Medical Diagnosis  Radiculopathy, Lumbar.    Referring Provider (PT)  Earnie Larsson MD    Next MD Visit  01/13/2019      Precautions   Precautions  None      Restrictions   Weight Bearing Restrictions  No  [facility] Adult PT Treatment/Exercise - 01/05/19 0001      Exercises   Exercises  Knee/Hip      Knee/Hip Exercises: Aerobic   Nustep  Level 4 x 15 minutes.      Modalities   Modalities  Electrical Stimulation;Moist Heat      Moist Heat Therapy   Number Minutes Moist Heat  15 Minutes    Moist Heat Location  Lumbar Spine      Electrical Stimulation   Electrical Stimulation Location  left glute    Electrical Stimulation Action  pre-mod    Electrical Stimulation Parameters  80-150 hz x15 mins    Electrical Stimulation Goals  Pain      Manual Therapy   Manual Therapy  Soft tissue mobilization    Soft tissue mobilization  STW/M to left glute and piriformis to decrease soreness and tone.             PT Education - 01/05/19 0902    Education Details  figure 4 stretch    Person(s) Educated  Patient    Methods  Explanation;Demonstration;Handout    Comprehension  Verbalized  understanding       PT Short Term Goals - 12/08/18 1606      PT SHORT TERM GOAL #1   Title  STG's=LTG's.        PT Long Term Goals - 12/22/18 0905      PT LONG TERM GOAL #1   Title  Independent with an HEP.    Time  8    Period  Weeks    Status  Achieved   per MPT verbal HEP     PT LONG TERM GOAL #2   Title  Patient sit 30 minutes with pain not > 3/10.    Time  8    Period  Weeks    Status  On-going      PT LONG TERM GOAL #3   Title  Stand 20 minutes with pain not > 3/10.    Time  8    Period  Weeks    Status  On-going      PT LONG TERM GOAL #4   Title  Eliminate left LE symptoms.    Time  8    Period  Weeks    Status  On-going      PT LONG TERM GOAL #5   Title  Perform ADL's with pain not > 3/10.    Time  8    Period  Weeks    Status  On-going            Plan - 01/05/19 0819    Clinical Impression Statement  Patient was able to respond well to therapy session with no reports of increased soreness after Nustep. Patient noted with increased glute/piriformis tightness and was instructed on a seated figure 4 stretch. Patient reported understanding. No adverse affects noted upon the removal of modalities.    Personal Factors and Comorbidities  Age    Examination-Activity Limitations  Bathing;Bed Mobility;Bend    Stability/Clinical Decision Making  Evolving/Moderate complexity    Clinical Decision Making  Low    Rehab Potential  Excellent    PT Frequency  2x / week    PT Duration  6 weeks    PT Treatment/Interventions  ADLs/Self Care Home Management;Cryotherapy;Electrical Stimulation;Therapeutic activities;Therapeutic exercise;Patient/family education;Manual techniques    PT Next Visit Plan  FOTO, progress note, MD note next visit (follow up 01/13/2019) STW/M and combo e'stim/U/S; SKTC and hip bridges.  HMP/E'stim.    Consulted and Agree with Plan of Care  Patient       Patient will benefit from skilled therapeutic intervention in order to improve the  following deficits and impairments:  Abnormal gait, Pain, Decreased range of motion, Decreased strength, Postural dysfunction  Visit Diagnosis: Acute left-sided low back pain with left-sided sciatica   Abnormal posture    Problem List Patient Active Problem List   Diagnosis Date Noted  . Morbid obesity ([place]) 11/18/2018  . OA (osteoarthritis) of knee 06/08/2016  . Arthritis of knee, right 01/31/2016  . Hyperlipidemia 09/02/2015  . Pre-diabetes 07/12/2015  . Essential hypertension 07/03/2015  . Post-traumatic arthritis of ankle 12/20/2014  . Acute medial meniscal tear 02/06/2014  . Low bone mass 12/14/2012  . GAD (generalized anxiety disorder) 12/14/2012  . Vitamin D deficiency 12/14/2012   Gabriela Eves, PT, DPT 01/05/2019, 9:10 AM  [facility] [address] [place], [place], [address] Phone: [phone]   Fax:  [phone]  Name: CARESSA SCEARCE MRN: [id number] Date of Birth: Aug 26, 1935

## 2019-01-10 ENCOUNTER — Encounter: Payer: Self-pay | Admitting: Physical Therapy

## 2019-01-10 ENCOUNTER — Other Ambulatory Visit: Payer: Self-pay

## 2019-01-10 ENCOUNTER — Ambulatory Visit: Payer: Medicare Other | Admitting: Physical Therapy

## 2019-01-10 DIAGNOSIS — R293 Abnormal posture: Secondary | ICD-10-CM | POA: Diagnosis not present

## 2019-01-10 DIAGNOSIS — M5442 Lumbago with sciatica, left side: Secondary | ICD-10-CM | POA: Diagnosis not present

## 2019-01-10 NOTE — Therapy (Signed)
[facility] [place], [place], [address] Phone: [phone]   Fax:  [phone]  Physical Therapy Treatment  Patient Details  Name: Mary Moss MRN: [id number] Date of Birth: 1936-05-11 Referring Provider (PT): Earnie Larsson MD   Encounter Date: 01/10/2019  PT End of Session - 01/10/19 0915    Visit Number  10    Number of Visits  12    Date for PT Re-Evaluation  03/08/19    Authorization Type  FOTO AT LEAST EVERY 5TH VISIT.  KX MODIFIER AFTER 15 VISITS.  PROGRESS NOTE AT 10TH VISIT.    PT Start Time  0914    PT Stop Time  1006    PT Time Calculation (min)  52 min    Activity Tolerance  Patient tolerated treatment well    Behavior During Therapy  [facility] for tasks assessed/performed       Past Medical History:  Diagnosis Date  . Ankle fracture fx 13 yrs ago, anklw swells off and on, has cramps in ankle also   left, to seee dr hewitt about 02-16-14  . Arthritis   . Basal cell carcinoma 2015   Nose  . GERD (gastroesophageal reflux disease)    OTC med  . Hypertension   . Pneumonia     Past Surgical History:  Procedure Laterality Date  . ABDOMINAL HYSTERECTOMY    . EYE SURGERY Bilateral    bilaterla cataract extraction with IOL  . FRACTURE SURGERY Left 2001  . JOINT REPLACEMENT    . KNEE ARTHROSCOPY Right 02/07/2014   Procedure: RIGHT ARTHROSCOPY KNEE, WITH MEDIAL MENISCAL DEBRIDEMENT AND CHONDRAPLASTY;  Surgeon: Gearlean Alf, MD;  [facility];  Service: Orthopedics;  Laterality: Right;  . ROTATOR CUFF REPAIR Right 2009  . TOTAL ANKLE ARTHROPLASTY Left 12/20/2014   Procedure: LEFT TOTAL ANKLE ARTHOPLASTY WITH PERCUTANEOUS ACHILLES TENDON LENGTHENING;  Surgeon: Wylene Simmer, MD;  Location: [place];  Service: Orthopedics;  Laterality: Left;  . TOTAL KNEE ARTHROPLASTY Right 06/08/2016   Procedure: RIGHT TOTAL KNEE ARTHROPLASTY;  Surgeon: Gaynelle Arabian, MD;  [facility];  Service: Orthopedics;  Laterality:  Right;    There were no vitals filed for this visit.  Subjective Assessment - 01/10/19 0914    Subjective  COVID 19 screening performed on patient upon arrival. Patient reports she has no pain but her back is still catching.    Pertinent History  HTN, Arthritis, right ankle replacement; right RTC repair and right TKA.    How long can you sit comfortably?  <10 minutes.    How long can you stand comfortably?  <10 minutes.    How long can you walk comfortably?  Around home.    Diagnostic tests  MRI.    Patient Stated Goals  Get out of pain.    Currently in Pain?  No/denies         [facility] PT Assessment - 01/10/19 0001      Assessment   Medical Diagnosis  Radiculopathy, Lumbar.    Referring Provider (PT)  Earnie Larsson MD    Next MD Visit  01/13/2019      Precautions   Precautions  None      Restrictions   Weight Bearing Restrictions  No      Observation/Other Assessments   Focus on Therapeutic Outcomes (FOTO)   55%, CK                   [facility] Adult PT Treatment/Exercise - 01/10/19  0001      Knee/Hip Exercises: Aerobic   Nustep  L5 x17 min      Modalities   Modalities  Electrical Stimulation;Moist Heat      Moist Heat Therapy   Number Minutes Moist Heat  15 Minutes    Moist Heat Location  Lumbar Spine      Electrical Stimulation   Electrical Stimulation Location  L SI/glute    Electrical Stimulation Action  Pre-Mod    Electrical Stimulation Parameters  80-150 hz x15 min    Electrical Stimulation Goals  Pain      Manual Therapy   Manual Therapy  Soft tissue mobilization    Soft tissue mobilization  STW/M to left glute, piriformis, QL to decrease soreness and tone.               PT Short Term Goals - 12/08/18 1606      PT SHORT TERM GOAL #1   Title  STG's=LTG's.        PT Long Term Goals - 01/10/19 0949      PT LONG TERM GOAL #1   Title  Independent with an HEP.    Time  8    Period  Weeks    Status  Achieved   per MPT verbal HEP      PT LONG TERM GOAL #2   Title  Patient sit 30 minutes with pain not > 3/10.    Time  8    Period  Weeks    Status  Achieved      PT LONG TERM GOAL #3   Title  Stand 20 minutes with pain not > 3/10.    Time  8    Period  Weeks    Status  Not Met   Reports weakness in low back when standing for prolonged period 01/10/2019     PT LONG TERM GOAL #4   Title  Eliminate left LE symptoms.    Time  8    Period  Weeks    Status  Achieved   Catcing in anterior groin intermittantly 01/10/2019     PT LONG TERM GOAL #5   Title  Perform ADL's with pain not > 3/10.    Time  8    Period  Weeks    Status  Partially Met   "discomfort not pain. Uncomfortable." 01/10/2019           Plan - 01/10/19 0953    Clinical Impression Statement  Patient presented in clinic with reports of improvement in LBP although still reporting catching in L anterior groin. Patient limited still with standing for longer than 15 minutes per patient report. Patient reports pain in posterior L hip which radiates to anterior L hip. Tightness palpable in L QL, glute and piriformis region. Normal modalities response noted following removal of the modalities. FOTO score at 10th visit 55%, CK status. PTA discussed with patient of walking for only short distances to build up strength but to not point of exhaustion and pain.    Personal Factors and Comorbidities  Age    Examination-Activity Limitations  Bathing;Bed Mobility;Bend    Stability/Clinical Decision Making  Evolving/Moderate complexity    Rehab Potential  Excellent    PT Frequency  2x / week    PT Duration  6 weeks    PT Treatment/Interventions  ADLs/Self Care Home Management;Cryotherapy;Electrical Stimulation;Therapeutic activities;Therapeutic exercise;Patient/family education;Manual techniques    PT Next Visit Plan  Progress note to be sent to Dr. Annette Stable  for tomorrow.    Consulted and Agree with Plan of Care  Patient       Patient will benefit from skilled  therapeutic intervention in order to improve the following deficits and impairments:  Abnormal gait, Pain, Decreased range of motion, Decreased strength, Postural dysfunction  Visit Diagnosis: 1. Acute left-sided low back pain with left-sided sciatica   2. Abnormal posture        Problem List Patient Active Problem List   Diagnosis Date Noted  . Morbid obesity ([place]) 11/18/2018  . OA (osteoarthritis) of knee 06/08/2016  . Arthritis of knee, right 01/31/2016  . Hyperlipidemia 09/02/2015  . Pre-diabetes 07/12/2015  . Essential hypertension 07/03/2015  . Post-traumatic arthritis of ankle 12/20/2014  . Acute medial meniscal tear 02/06/2014  . Low bone mass 12/14/2012  . GAD (generalized anxiety disorder) 12/14/2012  . Vitamin D deficiency 12/14/2012    Standley Brooking, PTA 01/10/19 10:13 AM   [facility] [address] [place], [place], [address] Phone: [phone]   Fax:  [phone]  Name: MATALIE ROMBERGER MRN: [id number] Date of Birth: April 09, 1936

## 2019-01-11 DIAGNOSIS — I10 Essential (primary) hypertension: Secondary | ICD-10-CM | POA: Diagnosis not present

## 2019-01-11 DIAGNOSIS — M5416 Radiculopathy, lumbar region: Secondary | ICD-10-CM | POA: Diagnosis not present

## 2019-01-11 DIAGNOSIS — Z6833 Body mass index (BMI) 33.0-33.9, adult: Secondary | ICD-10-CM | POA: Diagnosis not present

## 2019-01-12 ENCOUNTER — Other Ambulatory Visit: Payer: Self-pay

## 2019-01-12 ENCOUNTER — Encounter: Payer: Self-pay | Admitting: Physical Therapy

## 2019-01-12 ENCOUNTER — Ambulatory Visit: Payer: Medicare Other | Admitting: Physical Therapy

## 2019-01-12 DIAGNOSIS — M5442 Lumbago with sciatica, left side: Secondary | ICD-10-CM | POA: Diagnosis not present

## 2019-01-12 DIAGNOSIS — R293 Abnormal posture: Secondary | ICD-10-CM | POA: Diagnosis not present

## 2019-01-12 NOTE — Therapy (Signed)
[facility] [place], [place], [address] Phone: [phone]   Fax:  [phone]  Physical Therapy Treatment  Patient Details  Name: Mary Moss MRN: [id number] Date of Birth: 06-28-1936 Referring Provider (PT): Earnie Larsson MD   Encounter Date: 01/12/2019  PT End of Session - 01/12/19 0915    Visit Number  11    Number of Visits  12    Date for PT Re-Evaluation  03/08/19    Authorization Type  FOTO AT LEAST EVERY 5TH VISIT.  KX MODIFIER AFTER 15 VISITS.  PROGRESS NOTE AT 10TH VISIT.    PT Start Time  0913    PT Stop Time  1006    PT Time Calculation (min)  53 min    Activity Tolerance  Patient tolerated treatment well    Behavior During Therapy  WFL for tasks assessed/performed       Past Medical History:  Diagnosis Date  . Ankle fracture fx 13 yrs ago, anklw swells off and on, has cramps in ankle also   left, to seee dr hewitt about 02-16-14  . Arthritis   . Basal cell carcinoma 2015   Nose  . GERD (gastroesophageal reflux disease)    OTC med  . Hypertension   . Pneumonia     Past Surgical History:  Procedure Laterality Date  . ABDOMINAL HYSTERECTOMY    . EYE SURGERY Bilateral    bilaterla cataract extraction with IOL  . FRACTURE SURGERY Left 2001  . JOINT REPLACEMENT    . KNEE ARTHROSCOPY Right 02/07/2014   Procedure: RIGHT ARTHROSCOPY KNEE, WITH MEDIAL MENISCAL DEBRIDEMENT AND CHONDRAPLASTY;  Surgeon: Gearlean Alf, MD;  Location: [facility];  Service: Orthopedics;  Laterality: Right;  . ROTATOR CUFF REPAIR Right 2009  . TOTAL ANKLE ARTHROPLASTY Left 12/20/2014   Procedure: LEFT TOTAL ANKLE ARTHOPLASTY WITH PERCUTANEOUS ACHILLES TENDON LENGTHENING;  Surgeon: Wylene Simmer, MD;  Location: [place];  Service: Orthopedics;  Laterality: Left;  . TOTAL KNEE ARTHROPLASTY Right 06/08/2016   Procedure: RIGHT TOTAL KNEE ARTHROPLASTY;  Surgeon: Gaynelle Arabian, MD;  Location: [facility];  Service: Orthopedics;  Laterality:  Right;    There were no vitals filed for this visit.  Subjective Assessment - 01/12/19 0913    Subjective  COVID 19 screening performed on patient upon arrival. Reports that MD requested lumbar strengthening.    Pertinent History  HTN, Arthritis, right ankle replacement; right RTC repair and right TKA.    How long can you sit comfortably?  <10 minutes.    How long can you stand comfortably?  <10 minutes.    How long can you walk comfortably?  Around home.    Diagnostic tests  MRI.    Currently in Pain?  Yes    Pain Score  3     Pain Location  Back    Pain Orientation  Left;Lower    Pain Descriptors / Indicators  Discomfort    Pain Type  Acute pain    Pain Onset  More than a month ago    Pain Frequency  Intermittent         [facility] PT Assessment - 01/12/19 0001      Assessment   Medical Diagnosis  Radiculopathy, Lumbar.    Referring Provider (PT)  Earnie Larsson MD    Next MD Visit  02/2019      Precautions   Precautions  None      Restrictions   Weight Bearing Restrictions  No  [place] Adult PT Treatment/Exercise - 01/12/19 0001      Knee/Hip Exercises: Aerobic   Nustep  L5 x15 min      Knee/Hip Exercises: Machines for Strengthening   Cybex Knee Extension  50# x30 reps      Knee/Hip Exercises: Standing   Other Standing Knee Exercises  Shoulder row Pink XTS x20 reps      Knee/Hip Exercises: Supine   Hip Adduction Isometric  Strengthening;20 reps    Bridges  Strengthening;10 reps    Other Supine Knee/Hip Exercises  B hip clam green theraband x20 reps      Modalities   Modalities  Electrical Stimulation;Moist Heat      Moist Heat Therapy   Number Minutes Moist Heat  15 Minutes    Moist Heat Location  Lumbar Spine      Electrical Stimulation   Electrical Stimulation Location  L SI/glute    Electrical Stimulation Action  Pre-Mod    Electrical Stimulation Parameters  80-150 hz x15 min    Electrical Stimulation Goals  Pain                PT Short Term Goals - 12/08/18 1606      PT SHORT TERM GOAL #1   Title  STG's=LTG's.        PT Long Term Goals - 01/10/19 0949      PT LONG TERM GOAL #1   Title  Independent with an HEP.    Time  8    Period  Weeks    Status  Achieved   per MPT verbal HEP     PT LONG TERM GOAL #2   Title  Patient sit 30 minutes with pain not > 3/10.    Time  8    Period  Weeks    Status  Achieved      PT LONG TERM GOAL #3   Title  Stand 20 minutes with pain not > 3/10.    Time  8    Period  Weeks    Status  Not Met   Reports weakness in low back when standing for prolonged period 01/10/2019     PT LONG TERM GOAL #4   Title  Eliminate left LE symptoms.    Time  8    Period  Weeks    Status  Achieved   Catcing in anterior groin intermittantly 01/10/2019     PT LONG TERM GOAL #5   Title  Perform ADL's with pain not > 3/10.    Time  8    Period  Weeks    Status  Partially Met   "discomfort not pain. Uncomfortable." 01/10/2019           Plan - 01/12/19 0954    Clinical Impression Statement  Patient presented in clinic with reports of catching and discomfort around L SI joint. Patient guided through light core and lumbar strengthening without complaint of any increased LBP. Patient educated regarding rationale for all exercises and intermittant VCs required to improve form. Discomfort reported during transfers especially from sit > stand. Normal modalities response noted following removal of the modalities.    Personal Factors and Comorbidities  Age    Examination-Activity Limitations  Bathing;Bed Mobility;Bend    Stability/Clinical Decision Making  Evolving/Moderate complexity    Rehab Potential  Excellent    PT Frequency  2x / week    PT Duration  6 weeks    PT Treatment/Interventions  ADLs/Self Care Home Management;Cryotherapy;Electrical Stimulation;Therapeutic activities;Therapeutic  exercise;Patient/family education;Manual techniques    PT Next Visit Plan   Continue with lumbar strengthening per MD request.    Consulted and Agree with Plan of Care  Patient       Patient will benefit from skilled therapeutic intervention in order to improve the following deficits and impairments:  Abnormal gait, Pain, Decreased range of motion, Decreased strength, Postural dysfunction  Visit Diagnosis: 1. Acute left-sided low back pain with left-sided sciatica   2. Abnormal posture        Problem List Patient Active Problem List   Diagnosis Date Noted  . Morbid obesity ([place]) 11/18/2018  . OA (osteoarthritis) of knee 06/08/2016  . Arthritis of knee, right 01/31/2016  . Hyperlipidemia 09/02/2015  . Pre-diabetes 07/12/2015  . Essential hypertension 07/03/2015  . Post-traumatic arthritis of ankle 12/20/2014  . Acute medial meniscal tear 02/06/2014  . Low bone mass 12/14/2012  . GAD (generalized anxiety disorder) 12/14/2012  . Vitamin D deficiency 12/14/2012    Standley Brooking, PTA 01/12/2019, 10:30 AM  [facility] [address] [place], [place], [address] Phone: [phone]   Fax:  [phone]  Name: Mary Moss MRN: [id number] Date of Birth: 03/26/1936

## 2019-01-13 DIAGNOSIS — R300 Dysuria: Secondary | ICD-10-CM | POA: Diagnosis not present

## 2019-01-17 ENCOUNTER — Ambulatory Visit: Payer: Medicare Other | Admitting: Physical Therapy

## 2019-01-17 ENCOUNTER — Encounter: Payer: Self-pay | Admitting: Physical Therapy

## 2019-01-17 ENCOUNTER — Other Ambulatory Visit: Payer: Self-pay

## 2019-01-17 DIAGNOSIS — R293 Abnormal posture: Secondary | ICD-10-CM

## 2019-01-17 DIAGNOSIS — M5442 Lumbago with sciatica, left side: Secondary | ICD-10-CM

## 2019-01-17 NOTE — Addendum Note (Signed)
Addended by: Rowen Wilmer, [place] W on: 01/17/2019 04:25 PM   Modules accepted: Orders

## 2019-01-17 NOTE — Therapy (Signed)
[facility] [place], [place], [address] Phone: [phone]   Fax:  [phone]  Physical Therapy Treatment  Patient Details  Name: Mary Moss MRN: [id number] Date of Birth: [birth year] Referring Provider (PT): Earnie Larsson MD   Encounter Date: 01/17/2019  PT End of Session - 01/17/19 0915    Visit Number  12    Number of Visits  12    Date for PT Re-Evaluation  03/08/19    Authorization Type  FOTO AT LEAST EVERY 5TH VISIT.  KX MODIFIER AFTER 15 VISITS.  PROGRESS NOTE AT 10TH VISIT.    PT Start Time  0910    PT Stop Time  1003    PT Time Calculation (min)  53 min    Activity Tolerance  Patient tolerated treatment well    Behavior During Therapy  WFL for tasks assessed/performed       Past Medical History:  Diagnosis Date  . Ankle fracture fx 13 yrs ago, anklw swells off and on, has cramps in ankle also   left, to seee dr hewitt about 02-16-14  . Arthritis   . Basal cell carcinoma 2015   Nose  . GERD (gastroesophageal reflux disease)    OTC med  . Hypertension   . Pneumonia     Past Surgical History:  Procedure Laterality Date  . ABDOMINAL HYSTERECTOMY    . EYE SURGERY Bilateral    bilaterla cataract extraction with IOL  . FRACTURE SURGERY Left 2001  . JOINT REPLACEMENT    . KNEE ARTHROSCOPY Right 02/07/2014   Procedure: RIGHT ARTHROSCOPY KNEE, WITH MEDIAL MENISCAL DEBRIDEMENT AND CHONDRAPLASTY;  Surgeon: Gearlean Alf, MD;  Location: [facility];  Service: Orthopedics;  Laterality: Right;  . ROTATOR CUFF REPAIR Right 2009  . TOTAL ANKLE ARTHROPLASTY Left 12/20/2014   Procedure: LEFT TOTAL ANKLE ARTHOPLASTY WITH PERCUTANEOUS ACHILLES TENDON LENGTHENING;  Surgeon: Wylene Simmer, MD;  Location: [facility];  Service: Orthopedics;  Laterality: Left;  . TOTAL KNEE ARTHROPLASTY Right 06/08/2016   Procedure: RIGHT TOTAL KNEE ARTHROPLASTY;  Surgeon: Gaynelle Arabian, MD;  Location: [facility];  Service: Orthopedics;  Laterality:  Right;    There were no vitals filed for this visit.  Subjective Assessment - 01/17/19 0915    Subjective  COVID 19 screening performed on patient upon arrival. Reports doing stretches Saturday and may have strained a muscle as she could hardly walk over the weekend and took flexeril to calm pain.    Pertinent History  HTN, Arthritis, right ankle replacement; right RTC repair and right TKA.    How long can you sit comfortably?  <10 minutes.    How long can you stand comfortably?  <10 minutes.    How long can you walk comfortably?  Around home.    Diagnostic tests  MRI.    Patient Stated Goals  Get out of pain.    Currently in Pain?  Yes    Pain Score  5     Pain Location  Hip    Pain Orientation  Left;Posterior    Pain Descriptors / Indicators  Discomfort;Sore    Pain Type  Acute pain    Pain Onset  In the past 7 days    Pain Frequency  Constant         [facility] PT Assessment - 01/17/19 0001      Assessment   Medical Diagnosis  Radiculopathy, Lumbar.    Referring Provider (PT)  Earnie Larsson MD    Next MD  Visit  02/2019      Precautions   Precautions  None      Restrictions   Weight Bearing Restrictions  No                   [facility] Adult PT Treatment/Exercise - 01/17/19 0001      Knee/Hip Exercises: Aerobic   Nustep  L5 x15 min      Modalities   Modalities  Electrical Stimulation;Moist Heat;Ultrasound      Moist Heat Therapy   Number Minutes Moist Heat  15 Minutes    Moist Heat Location  Lumbar Spine      Electrical Stimulation   Electrical Stimulation Location  L lumbar/ glute    Electrical Stimulation Action  Pre-Mod    Electrical Stimulation Parameters  80-150 hz x15 min    Electrical Stimulation Goals  Pain      Ultrasound   Ultrasound Location  L SI/ glute    Ultrasound Parameters  1.5 w/cm2, 100%, 1 mhz x10 min    Ultrasound Goals  Pain      Manual Therapy   Manual Therapy  Soft tissue mobilization    Soft tissue mobilization  STW/M to left  glute, piriformis, lumbar paraspinals to decrease soreness and tone.               PT Short Term Goals - 12/08/18 1606      PT SHORT TERM GOAL #1   Title  STG's=LTG's.        PT Long Term Goals - 01/10/19 0949      PT LONG TERM GOAL #1   Title  Independent with an HEP.    Time  8    Period  Weeks    Status  Achieved   per MPT verbal HEP     PT LONG TERM GOAL #2   Title  Patient sit 30 minutes with pain not > 3/10.    Time  8    Period  Weeks    Status  Achieved      PT LONG TERM GOAL #3   Title  Stand 20 minutes with pain not > 3/10.    Time  8    Period  Weeks    Status  Not Met   Reports weakness in low back when standing for prolonged period 01/10/2019     PT LONG TERM GOAL #4   Title  Eliminate left LE symptoms.    Time  8    Period  Weeks    Status  Achieved   Catcing in anterior groin intermittantly 01/10/2019     PT LONG TERM GOAL #5   Title  Perform ADL's with pain not > 3/10.    Time  8    Period  Weeks    Status  Partially Met   "discomfort not pain. Uncomfortable." 01/10/2019           Plan - 01/17/19 1000    Clinical Impression Statement  Patient presented in clinic with reports of flare up following pain after stretching during HEP. Patient reported less pain and stiffness after Nustep warm up today. Normal modalities response noted following removal of the modalities. STW completed to reduce muscle tightness resulting from flare up with increased tightness palpable in L glute region.    Personal Factors and Comorbidities  Age    Examination-Activity Limitations  Bathing;Bed Mobility;Bend    Stability/Clinical Decision Making  Evolving/Moderate complexity    Rehab Potential  Excellent  PT Frequency  2x / week    PT Duration  6 weeks    PT Treatment/Interventions  ADLs/Self Care Home Management;Cryotherapy;Electrical Stimulation;Therapeutic activities;Therapeutic exercise;Patient/family education;Manual techniques    PT Next Visit  Plan  Continue with lumbar strengthening per MD request.    Consulted and Agree with Plan of Care  Patient       Patient will benefit from skilled therapeutic intervention in order to improve the following deficits and impairments:  Abnormal gait, Pain, Decreased range of motion, Decreased strength, Postural dysfunction  Visit Diagnosis: 1. Acute left-sided low back pain with left-sided sciatica   2. Abnormal posture        Problem List Patient Active Problem List   Diagnosis Date Noted  . Morbid obesity ([place]) 11/18/2018  . OA (osteoarthritis) of knee 06/08/2016  . Arthritis of knee, right 01/31/2016  . Hyperlipidemia 09/02/2015  . Pre-diabetes 07/12/2015  . Essential hypertension 07/03/2015  . Post-traumatic arthritis of ankle 12/20/2014  . Acute medial meniscal tear 02/06/2014  . Low bone mass 12/14/2012  . GAD (generalized anxiety disorder) 12/14/2012  . Vitamin D deficiency 12/14/2012    Standley Brooking, PTA 01/17/2019, 10:24 AM  [facility] [address] [place], [place], [address] Phone: [phone]   Fax:  [phone]  Name: Mary Moss MRN: [id number] Date of Birth: 02-Sep-1935

## 2019-01-19 ENCOUNTER — Encounter: Payer: Self-pay | Admitting: Physical Therapy

## 2019-01-19 ENCOUNTER — Other Ambulatory Visit: Payer: Self-pay

## 2019-01-19 ENCOUNTER — Ambulatory Visit: Payer: Medicare Other | Admitting: Physical Therapy

## 2019-01-19 DIAGNOSIS — R293 Abnormal posture: Secondary | ICD-10-CM | POA: Diagnosis not present

## 2019-01-19 DIAGNOSIS — M5442 Lumbago with sciatica, left side: Secondary | ICD-10-CM | POA: Diagnosis not present

## 2019-01-19 NOTE — Therapy (Signed)
[facility] [place], [place], [address] Phone: [phone]   Fax:  [phone]  Physical Therapy Treatment  Patient Details  Name: Mary Moss MRN: [id number] Date of Birth: June 02, 1936 Referring Provider (PT): Earnie Larsson MD   Encounter Date: 01/19/2019  PT End of Session - 01/19/19 0926    Visit Number  13    Number of Visits  18    Date for PT Re-Evaluation  03/08/19    Authorization Type  FOTO AT LEAST EVERY 5TH VISIT.  KX MODIFIER AFTER 15 VISITS.  PROGRESS NOTE AT 10TH VISIT.    PT Start Time  [phone]    PT Stop Time  1008    PT Time Calculation (min)  52 min    Activity Tolerance  Patient tolerated treatment well    Behavior During Therapy  WFL for tasks assessed/performed       Past Medical History:  Diagnosis Date  . Ankle fracture fx 13 yrs ago, anklw swells off and on, has cramps in ankle also   left, to seee dr hewitt about 02-16-14  . Arthritis   . Basal cell carcinoma 2015   Nose  . GERD (gastroesophageal reflux disease)    OTC med  . Hypertension   . Pneumonia     Past Surgical History:  Procedure Laterality Date  . ABDOMINAL HYSTERECTOMY    . EYE SURGERY Bilateral    bilaterla cataract extraction with IOL  . FRACTURE SURGERY Left 2001  . JOINT REPLACEMENT    . KNEE ARTHROSCOPY Right 02/07/2014   Procedure: RIGHT ARTHROSCOPY KNEE, WITH MEDIAL MENISCAL DEBRIDEMENT AND CHONDRAPLASTY;  Surgeon: Gearlean Alf, MD;  Location: [facility];  Service: Orthopedics;  Laterality: Right;  . ROTATOR CUFF REPAIR Right 2009  . TOTAL ANKLE ARTHROPLASTY Left 12/20/2014   Procedure: LEFT TOTAL ANKLE ARTHOPLASTY WITH PERCUTANEOUS ACHILLES TENDON LENGTHENING;  Surgeon: Wylene Simmer, MD;  Location: [place];  Service: Orthopedics;  Laterality: Left;  . TOTAL KNEE ARTHROPLASTY Right 06/08/2016   Procedure: RIGHT TOTAL KNEE ARTHROPLASTY;  Surgeon: Gaynelle Arabian, MD;  Location[facility];  Service: Orthopedics;  Laterality:  Right;    There were no vitals filed for this visit.  Subjective Assessment - 01/19/19 0915    Subjective  COVID 19 screening performed on patient upon arrival. Patient reports that the grabbing in L SI almost caused her to fall in the floor due to pain. Patient reports that the L LBP was pretty good yesterday and then she woke this morning with more pain.    Pertinent History  HTN, Arthritis, right ankle replacement; right RTC repair and right TKA.    How long can you sit comfortably?  <10 minutes.    How long can you stand comfortably?  <10 minutes.    How long can you walk comfortably?  Around home.    Diagnostic tests  MRI.    Patient Stated Goals  Get out of pain.    Currently in Pain?  Yes    Pain Score  8    10/10 intermittant grabbing of L SI   Pain Location  Back    Pain Orientation  Left;Posterior    Pain Descriptors / Indicators  Aching    Pain Type  Acute pain    Pain Onset  In the past 7 days    Pain Frequency  Constant         [facility] PT Assessment - 01/19/19 0001      Assessment  Medical Diagnosis  Radiculopathy, Lumbar.    Referring Provider (PT)  Earnie Larsson MD    Next MD Visit  02/2019      Precautions   Precautions  None      Restrictions   Weight Bearing Restrictions  No                   [facility] Adult PT Treatment/Exercise - 01/19/19 0001      Knee/Hip Exercises: Aerobic   Nustep  L5 x15 min      Modalities   Modalities  Electrical Stimulation;Moist Heat;Ultrasound      Moist Heat Therapy   Number Minutes Moist Heat  10 Minutes    Moist Heat Location  Lumbar Spine      Electrical Stimulation   Electrical Stimulation Location  L SI joint    Electrical Stimulation Action  Pre-Mod    Electrical Stimulation Parameters  80-150 hz x10 min    Electrical Stimulation Goals  Pain      Ultrasound   Ultrasound Location  L SI joint    Ultrasound Parameters  Combo 1.5 w/cm2, 100%, 1 mhz x10 min    Ultrasound Goals  Pain      Manual Therapy    Manual Therapy  Soft tissue mobilization    Soft tissue mobilization  STW to L SI joint and surrounding tissues to reduce muscle guarding and pain             PT Education - 01/19/19 1001    Education Details  DN consent form    Person(s) Educated  Patient    Methods  Explanation;Handout    Comprehension  Verbalized understanding       PT Short Term Goals - 12/08/18 1606      PT SHORT TERM GOAL #1   Title  STG's=LTG's.        PT Long Term Goals - 01/10/19 0949      PT LONG TERM GOAL #1   Title  Independent with an HEP.    Time  8    Period  Weeks    Status  Achieved   per MPT verbal HEP     PT LONG TERM GOAL #2   Title  Patient sit 30 minutes with pain not > 3/10.    Time  8    Period  Weeks    Status  Achieved      PT LONG TERM GOAL #3   Title  Stand 20 minutes with pain not > 3/10.    Time  8    Period  Weeks    Status  Not Met   Reports weakness in low back when standing for prolonged period 01/10/2019     PT LONG TERM GOAL #4   Title  Eliminate left LE symptoms.    Time  8    Period  Weeks    Status  Achieved   Catcing in anterior groin intermittantly 01/10/2019     PT LONG TERM GOAL #5   Title  Perform ADL's with pain not > 3/10.    Time  8    Period  Weeks    Status  Partially Met   "discomfort not pain. Uncomfortable." 01/10/2019           Plan - 01/19/19 1002    Clinical Impression Statement  Patient presented in clinic with guarded and antalgic gait due to catching sensation of L SI joint that occurs intermittantly. Patient reports that with each  catch that pain can reach 10/10 and feels as if she could fall. Patient able to complete Nustep in hopes of loosening her hips and low back. Patient guarded with transitional movements for transfers and bed mobility. Patient's leg lengths assessed by Mali Applegate, MPT to which he assessed as equal. Patient educated regarding the potential treatment option of DN. Patient provided DN consent  form with education of answering questionaire and watching youtube videos. Normal modalities response noted following removal of the modalities.    Personal Factors and Comorbidities  Age    Examination-Activity Limitations  Bathing;Bed Mobility;Bend    Stability/Clinical Decision Making  Evolving/Moderate complexity    Rehab Potential  Excellent    PT Frequency  2x / week    PT Duration  6 weeks    PT Treatment/Interventions  ADLs/Self Care Home Management;Cryotherapy;Electrical Stimulation;Therapeutic activities;Therapeutic exercise;Patient/family education;Manual techniques    PT Next Visit Plan  Continue with lumbar strengthening per MD request. DN possible next treatment as patient has been provided DN concent form.    Consulted and Agree with Plan of Care  Patient       Patient will benefit from skilled therapeutic intervention in order to improve the following deficits and impairments:  Abnormal gait, Pain, Decreased range of motion, Decreased strength, Postural dysfunction  Visit Diagnosis: 1. Acute left-sided low back pain with left-sided sciatica   2. Abnormal posture        Problem List Patient Active Problem List   Diagnosis Date Noted  . Morbid obesity ([place]) 11/18/2018  . OA (osteoarthritis) of knee 06/08/2016  . Arthritis of knee, right 01/31/2016  . Hyperlipidemia 09/02/2015  . Pre-diabetes 07/12/2015  . Essential hypertension 07/03/2015  . Post-traumatic arthritis of ankle 12/20/2014  . Acute medial meniscal tear 02/06/2014  . Low bone mass 12/14/2012  . GAD (generalized anxiety disorder) 12/14/2012  . Vitamin D deficiency 12/14/2012    Standley Brooking, PTA 01/19/2019, 10:10 AM  [facility] [address] [place], [place], [address] Phone: [phone]   Fax:  [phone]  Name: SHEVELLE SMITHER MRN: [id number] Date of Birth: [birth year]

## 2019-01-19 NOTE — Patient Instructions (Signed)
[facility]  DRY NEEDLING CONSENT FORM   Trigger point dry needling is a physical therapy approach to treat Myofascial Pain and Dysfunction.  Dry Needling (DN) is a valuable and effective way to deactivate myofascial trigger points (muscle knots). It is skilled intervention that uses a thin filiform needle to penetrate the skin and stimulate underlying myofascial trigger points, muscular, and connective tissues for the management of neuromusculoskeletal pain and movement impairments.  A local twitch response (LTR) will be elicited.  This can sometimes feel like a deep ache in the muscle during the procedure. Multiple trigger points in multiple muscles can be treated during each treatment.  No medication of any kind is injected.   As with any medical treatment and procedure, there are possible adverse events.  While significant adverse events are uncommon, they do sometimes occur and must be considered prior to giving consent.  1. Dry needling often causes a "post needling soreness".  There can be an increase in pain from a couple of hours to 2-3 days, followed by an improvement in the overall pain state. 2. Any time a needle is used there is a risk of infection.  However, we are using new, sterile, and disposable needles; infections are extremely rare. 3. There is a possibility that you may bleed or bruise.  You may feel tired and some nausea following treatment. 4. There is a rare possibility of a pneumothorax (air in the chest cavity). 5. Allergic reaction to nickel in the stainless steel needle. 6. If a nerve is touched, it may cause paresthesia (a prickling/shock sensation) which is usually brief, but may continue for a couple of days.  Following treatment stay hydrated.  Continue regular activities but not too vigorous initially after treatment for 24-48 hours. You may apply heat to sore muscles.  Dry Needling is best when combined with other physical therapy  interventions such as strengthening, stretching and other therapeutic modalities.     PLEASE ANSWER THE FOLLOWING QUESTIONS:  Do you have a lack of sensation?   Y/N  Do you have a phobia or fear of needles  Y/N  Are you pregnant?    Y/N If yes:  How many weeks? _____  Do you have any implanted devices?  Y/N If yes:  Pacemaker/Spinal Cord         Stimulator/Deep Brain         Stimulator/Insulin          Pump/Other: ____________ Do you have any implants?   Y/N If yes:      Do you take any blood thinners?   Y/N If yes: Coumadin          (Warfarin)/Other:  Do you have a bleeding disorder?   Y/N If yes: What kind:   Do you take any immunosuppressants?  Y/N If yes:   What kind:   Do you take anti-inflammatories?   Y/N If yes: What kind:  Have you ever been diagnosed with Scoliosis? Y/N  Have you had back surgery?    Y/N If yes:         Laminectomy/Fusion/Other:   I have read, or had read to me, the above.  I have had the opportunity to ask any questions.  All of my questions have been answered to my satisfaction and I understand the risks involved with dry needling.  I consent to examination and treatment at [facility], including dry needling, of any and all of my involved and affected   muscles.   

## 2019-01-24 ENCOUNTER — Other Ambulatory Visit: Payer: Self-pay

## 2019-01-24 ENCOUNTER — Ambulatory Visit: Payer: Medicare Other | Admitting: Physical Therapy

## 2019-01-24 DIAGNOSIS — M5442 Lumbago with sciatica, left side: Secondary | ICD-10-CM

## 2019-01-24 DIAGNOSIS — R293 Abnormal posture: Secondary | ICD-10-CM

## 2019-01-24 NOTE — Therapy (Signed)
[facility] [place], [place], [address] Phone: [phone]   Fax:  [phone]  Physical Therapy Treatment  Patient Details  Name: Mary Moss MRN: [id number] Date of Birth: Oct 20, 1935 Referring Provider (PT): Earnie Larsson MD   Encounter Date: 01/24/2019  PT End of Session - 01/24/19 1056    Visit Number  14    Number of Visits  18    Date for PT Re-Evaluation  03/08/19    Authorization Type  FOTO AT LEAST EVERY 5TH VISIT.  KX MODIFIER AFTER 15 VISITS.  PROGRESS NOTE AT 10TH VISIT.    PT Start Time  0915    PT Stop Time  1031    PT Time Calculation (min)  76 min    Activity Tolerance  Patient tolerated treatment well    Behavior During Therapy  WFL for tasks assessed/performed       Past Medical History:  Diagnosis Date  . Ankle fracture fx 13 yrs ago, anklw swells off and on, has cramps in ankle also   left, to seee dr hewitt about 02-16-14  . Arthritis   . Basal cell carcinoma 2015   Nose  . GERD (gastroesophageal reflux disease)    OTC med  . Hypertension   . Pneumonia     Past Surgical History:  Procedure Laterality Date  . ABDOMINAL HYSTERECTOMY    . EYE SURGERY Bilateral    bilaterla cataract extraction with IOL  . FRACTURE SURGERY Left 2001  . JOINT REPLACEMENT    . KNEE ARTHROSCOPY Right 02/07/2014   Procedure: RIGHT ARTHROSCOPY KNEE, WITH MEDIAL MENISCAL DEBRIDEMENT AND CHONDRAPLASTY;  Surgeon: Gearlean Alf, MD;  Location: [facility];  Service: Orthopedics;  Laterality: Right;  . ROTATOR CUFF REPAIR Right 2009  . TOTAL ANKLE ARTHROPLASTY Left 12/20/2014   Procedure: LEFT TOTAL ANKLE ARTHOPLASTY WITH PERCUTANEOUS ACHILLES TENDON LENGTHENING;  Surgeon: Wylene Simmer, MD;  Location: [facility];  Service: Orthopedics;  Laterality: Left;  . TOTAL KNEE ARTHROPLASTY Right 06/08/2016   Procedure: RIGHT TOTAL KNEE ARTHROPLASTY;  Surgeon: Gaynelle Arabian, MD;  Location: [facility];  Service: Orthopedics;  Laterality:  Right;    There were no vitals filed for this visit.  Subjective Assessment - 01/24/19 1055    Subjective  COVID-19 screen performed prior to patient entering clinic.  Flare-up again yesterday but better today.  Overall, the patient states she is much better with less frequent flare-ups and much less LE symptoms.    Pertinent History  HTN, Arthritis, right ankle replacement; right RTC repair and right TKA.    How long can you sit comfortably?  <10 minutes.    How long can you stand comfortably?  <10 minutes.    How long can you walk comfortably?  Around home.    Diagnostic tests  MRI.    Patient Stated Goals  Get out of pain.    Currently in Pain?  Yes    Pain Score  7     Pain Location  Back    Pain Orientation  Left;Posterior    Pain Descriptors / Indicators  Aching    Pain Type  Acute pain    Pain Onset  In the past 7 days                       [facility] Adult PT Treatment/Exercise - 01/24/19 0001      Modalities   Modalities  Electrical Stimulation;Moist Heat;Ultrasound      Moist  Heat Therapy   Number Minutes Moist Heat  20 Minutes    Moist Heat Location  Lumbar Spine      Electrical Stimulation   Electrical Stimulation Location  Left SIJ region.    Electrical Stimulation Action  Pre-mod.    Electrical Stimulation Parameters  80-150 Hz x 20 minutes.      Ultrasound   Ultrasound Location  Left SIJ region.    Ultrasound Parameters  U/S at 1.50 W/CM2 x 12 minutes.      Manual Therapy   Manual Therapy  Soft tissue mobilization    Soft tissue mobilization  STW/M x 15 minutes to left upper gluteal, left SIJ and left QL to reduce tone.       Trigger Point Dry Needling - 01/24/19 0001    Consent Given?  Yes    Education Handout Provided  Yes    Muscles Treated Back/Hip  Gluteus medius;Erector spinae;Lumbar multifidi    Gluteus Medius Response  Twitch response elicited    Erector spinae Response  Twitch response elicited    Lumbar multifidi Response  Twitch  response elicited             PT Short Term Goals - 12/08/18 1606      PT SHORT TERM GOAL #1   Title  STG's=LTG's.        PT Long Term Goals - 01/10/19 0949      PT LONG TERM GOAL #1   Title  Independent with an HEP.    Time  8    Period  Weeks    Status  Achieved   per MPT verbal HEP     PT LONG TERM GOAL #2   Title  Patient sit 30 minutes with pain not > 3/10.    Time  8    Period  Weeks    Status  Achieved      PT LONG TERM GOAL #3   Title  Stand 20 minutes with pain not > 3/10.    Time  8    Period  Weeks    Status  Not Met   Reports weakness in low back when standing for prolonged period 01/10/2019     PT LONG TERM GOAL #4   Title  Eliminate left LE symptoms.    Time  8    Period  Weeks    Status  Achieved   Catcing in anterior groin intermittantly 01/10/2019     PT LONG TERM GOAL #5   Title  Perform ADL's with pain not > 3/10.    Time  8    Period  Weeks    Status  Partially Met   "discomfort not pain. Uncomfortable." 01/10/2019           Plan - 01/24/19 1100    Clinical Impression Statement  Patient did well with treatment today and felt better following.  Patient is pleased with her progress though she still gets "catches" in her left SIJ region.    Personal Factors and Comorbidities  Age    Examination-Activity Limitations  Bathing;Bed Mobility;Bend    Stability/Clinical Decision Making  Evolving/Moderate complexity    Rehab Potential  Excellent    PT Frequency  2x / week    PT Duration  6 weeks    PT Treatment/Interventions  ADLs/Self Care Home Management;Cryotherapy;Electrical Stimulation;Therapeutic activities;Therapeutic exercise;Patient/family education;Manual techniques    PT Next Visit Plan  Continue with lumbar strengthening per MD request. DN possible next treatment as patient has been  provided DN concent form.    Consulted and Agree with Plan of Care  Patient       Patient will benefit from skilled therapeutic intervention in  order to improve the following deficits and impairments:  Abnormal gait, Pain, Decreased range of motion, Decreased strength, Postural dysfunction  Visit Diagnosis: 1. Acute left-sided low back pain with left-sided sciatica   2. Abnormal posture        Problem List Patient Active Problem List   Diagnosis Date Noted  . Morbid obesity ([place]) 11/18/2018  . OA (osteoarthritis) of knee 06/08/2016  . Arthritis of knee, right 01/31/2016  . Hyperlipidemia 09/02/2015  . Pre-diabetes 07/12/2015  . Essential hypertension 07/03/2015  . Post-traumatic arthritis of ankle 12/20/2014  . Acute medial meniscal tear 02/06/2014  . Low bone mass 12/14/2012  . GAD (generalized anxiety disorder) 12/14/2012  . Vitamin D deficiency 12/14/2012    Amara Manalang, [place] MPT 01/24/2019, 11:02 AM  [facility] [address] [place], [place], [address] Phone: [phone]   Fax:  [phone]  Name: Mary Moss MRN: [id number] Date of Birth: 02-Jul-1936

## 2019-01-31 ENCOUNTER — Other Ambulatory Visit: Payer: Self-pay

## 2019-01-31 ENCOUNTER — Ambulatory Visit: Payer: Medicare Other | Attending: Neurosurgery | Admitting: Physical Therapy

## 2019-01-31 DIAGNOSIS — R293 Abnormal posture: Secondary | ICD-10-CM | POA: Diagnosis not present

## 2019-01-31 DIAGNOSIS — M5442 Lumbago with sciatica, left side: Secondary | ICD-10-CM | POA: Diagnosis not present

## 2019-01-31 NOTE — Therapy (Signed)
[facility] [place], [place], [address] Phone: [phone]   Fax:  [phone]  Physical Therapy Treatment  Patient Details  Name: Mary Moss MRN: [id number] Date of Birth: 1936/03/14 Referring Provider (PT): Earnie Larsson MD   Encounter Date: 01/31/2019  PT End of Session - 01/31/19 1029    Visit Number  15    Number of Visits  18    Date for PT Re-Evaluation  03/08/19    Authorization Type  FOTO AT LEAST EVERY 5TH VISIT.  KX MODIFIER AFTER 15 VISITS.  PROGRESS NOTE AT 10TH VISIT.    PT Start Time  0900    PT Stop Time  0950    PT Time Calculation (min)  50 min    Activity Tolerance  Patient tolerated treatment well    Behavior During Therapy  WFL for tasks assessed/performed       Past Medical History:  Diagnosis Date  . Ankle fracture fx 13 yrs ago, anklw swells off and on, has cramps in ankle also   left, to seee dr hewitt about 02-16-14  . Arthritis   . Basal cell carcinoma 2015   Nose  . GERD (gastroesophageal reflux disease)    OTC med  . Hypertension   . Pneumonia     Past Surgical History:  Procedure Laterality Date  . ABDOMINAL HYSTERECTOMY    . EYE SURGERY Bilateral    bilaterla cataract extraction with IOL  . FRACTURE SURGERY Left 2001  . JOINT REPLACEMENT    . KNEE ARTHROSCOPY Right 02/07/2014   Procedure: RIGHT ARTHROSCOPY KNEE, WITH MEDIAL MENISCAL DEBRIDEMENT AND CHONDRAPLASTY;  Surgeon: Gearlean Alf, MD;  Location: [facility];  Service: Orthopedics;  Laterality: Right;  . ROTATOR CUFF REPAIR Right 2009  . TOTAL ANKLE ARTHROPLASTY Left 12/20/2014   Procedure: LEFT TOTAL ANKLE ARTHOPLASTY WITH PERCUTANEOUS ACHILLES TENDON LENGTHENING;  Surgeon: Wylene Simmer, MD;  Location: [facility];  Service: Orthopedics;  Laterality: Left;  . TOTAL KNEE ARTHROPLASTY Right 06/08/2016   Procedure: RIGHT TOTAL KNEE ARTHROPLASTY;  Surgeon: Gaynelle Arabian, MD;  Location: [facility];  Service: Orthopedics;  Laterality:  Right;    There were no vitals filed for this visit.  Subjective Assessment - 01/31/19 1032    Subjective  COVID-19 screen performed prior to patient entering clinic.  Flare-up again and pain about a 7/10.    Pertinent History  HTN, Arthritis, right ankle replacement; right RTC repair and right TKA.    Patient Stated Goals  Get out of pain.    Currently in Pain?  Yes    Pain Score  7                        [facility] Adult PT Treatment/Exercise - 01/31/19 0001      Modalities   Modalities  Electrical Stimulation;Moist Heat      Moist Heat Therapy   Number Minutes Moist Heat  20 Minutes    Moist Heat Location  Lumbar Spine      Electrical Stimulation   Electrical Stimulation Location  Left SIJ region.    Electrical Stimulation Action  Pre-mod.    Electrical Stimulation Parameters  80-150 Hz x 20 minutes.    Electrical Stimulation Goals  Pain      Manual Therapy   Manual Therapy  Soft tissue mobilization    Manual therapy comments  Right femoral oscilliations in supine and left pelvic oscilliations from the right sdly position f/b STW/M  to patient's left SIJ.               PT Short Term Goals - 12/08/18 1606      PT SHORT TERM GOAL #1   Title  STG's=LTG's.        PT Long Term Goals - 01/10/19 0949      PT LONG TERM GOAL #1   Title  Independent with an HEP.    Time  8    Period  Weeks    Status  Achieved   per MPT verbal HEP     PT LONG TERM GOAL #2   Title  Patient sit 30 minutes with pain not > 3/10.    Time  8    Period  Weeks    Status  Achieved      PT LONG TERM GOAL #3   Title  Stand 20 minutes with pain not > 3/10.    Time  8    Period  Weeks    Status  Not Met   Reports weakness in low back when standing for prolonged period 01/10/2019     PT LONG TERM GOAL #4   Title  Eliminate left LE symptoms.    Time  8    Period  Weeks    Status  Achieved   Catcing in anterior groin intermittantly 01/10/2019     PT LONG TERM GOAL #5    Title  Perform ADL's with pain not > 3/10.    Time  8    Period  Weeks    Status  Partially Met   "discomfort not pain. Uncomfortable." 01/10/2019           Plan - 01/31/19 1043    Clinical Impression Statement  Patient into clinic today with flare-up.  Patient with all painlocalized toleft SIJ but no reports of left LE pain which is an improvement.    PT Treatment/Interventions  ADLs/Self Care Home Management;Cryotherapy;Electrical Stimulation;Therapeutic activities;Therapeutic exercise;Patient/family education;Manual techniques       Patient will benefit from skilled therapeutic intervention in order to improve the following deficits and impairments:     Visit Diagnosis: 1. Acute left-sided low back pain with left-sided sciatica   2. Abnormal posture        Problem List Patient Active Problem List   Diagnosis Date Noted  . Morbid obesity ([place]) 11/18/2018  . OA (osteoarthritis) of knee 06/08/2016  . Arthritis of knee, right 01/31/2016  . Hyperlipidemia 09/02/2015  . Pre-diabetes 07/12/2015  . Essential hypertension 07/03/2015  . Post-traumatic arthritis of ankle 12/20/2014  . Acute medial meniscal tear 02/06/2014  . Low bone mass 12/14/2012  . GAD (generalized anxiety disorder) 12/14/2012  . Vitamin D deficiency 12/14/2012    Beckhem Isadore, [place] MPT 01/31/2019, 10:45 AM  [facility] [address] [place], [place], [address] Phone: [phone]   Fax:  [phone]  Name: MOLLEY HOUSER MRN: [id number] Date of Birth: September 26, 1935

## 2019-02-02 ENCOUNTER — Ambulatory Visit: Payer: Medicare Other | Admitting: Physical Therapy

## 2019-02-02 ENCOUNTER — Encounter: Payer: Self-pay | Admitting: Physical Therapy

## 2019-02-02 ENCOUNTER — Other Ambulatory Visit: Payer: Self-pay

## 2019-02-02 DIAGNOSIS — R293 Abnormal posture: Secondary | ICD-10-CM | POA: Diagnosis not present

## 2019-02-02 DIAGNOSIS — M5442 Lumbago with sciatica, left side: Secondary | ICD-10-CM | POA: Diagnosis not present

## 2019-02-02 NOTE — Therapy (Signed)
[facility] [place], [place], [address] Phone: [phone]   Fax:  [phone]  Physical Therapy Treatment  Patient Details  Name: Mary Moss MRN: [id number] Date of Birth: [birth year] Referring Provider (PT): Earnie Larsson MD   Encounter Date: 02/02/2019  PT End of Session - 02/02/19 0905    Visit Number  16    Number of Visits  18    Date for PT Re-Evaluation  03/08/19    Authorization Type  FOTO AT LEAST EVERY 5TH VISIT.  KX MODIFIER AFTER 15 VISITS.  PROGRESS NOTE AT 10TH VISIT.    PT Start Time  0900    PT Stop Time  0957    PT Time Calculation (min)  57 min    Activity Tolerance  Patient tolerated treatment well    Behavior During Therapy  WFL for tasks assessed/performed       Past Medical History:  Diagnosis Date  . Ankle fracture fx 13 yrs ago, anklw swells off and on, has cramps in ankle also   left, to seee dr hewitt about 02-16-14  . Arthritis   . Basal cell carcinoma 2015   Nose  . GERD (gastroesophageal reflux disease)    OTC med  . Hypertension   . Pneumonia     Past Surgical History:  Procedure Laterality Date  . ABDOMINAL HYSTERECTOMY    . EYE SURGERY Bilateral    bilaterla cataract extraction with IOL  . FRACTURE SURGERY Left 2001  . JOINT REPLACEMENT    . KNEE ARTHROSCOPY Right 02/07/2014   Procedure: RIGHT ARTHROSCOPY KNEE, WITH MEDIAL MENISCAL DEBRIDEMENT AND CHONDRAPLASTY;  Surgeon: Gearlean Alf, MD;  Location: [facility];  Service: Orthopedics;  Laterality: Right;  . ROTATOR CUFF REPAIR Right 2009  . TOTAL ANKLE ARTHROPLASTY Left 12/20/2014   Procedure: LEFT TOTAL ANKLE ARTHOPLASTY WITH PERCUTANEOUS ACHILLES TENDON LENGTHENING;  Surgeon: Wylene Simmer, MD;  Location: [facility];  Service: Orthopedics;  Laterality: Left;  . TOTAL KNEE ARTHROPLASTY Right 06/08/2016   Procedure: RIGHT TOTAL KNEE ARTHROPLASTY;  Surgeon: Gaynelle Arabian, MD;  Location: [facility];  Service: Orthopedics;  Laterality:  Right;    There were no vitals filed for this visit.  Subjective Assessment - 02/02/19 0902    Subjective  COVID-19 screen performed prior to patient entering clinic.  Patient reports that LBP isn't excruciating today. Reports that pain wouldn't be that bad if she could sit all the time.    Pertinent History  HTN, Arthritis, right ankle replacement; right RTC repair and right TKA.    How long can you sit comfortably?  <10 minutes.    How long can you stand comfortably?  <10 minutes.    How long can you walk comfortably?  Around home.    Diagnostic tests  MRI.    Patient Stated Goals  Get out of pain.    Currently in Pain?  Yes    Pain Score  --   No pain score provided   Pain Location  Back    Pain Orientation  Left;Posterior    Pain Descriptors / Indicators  Discomfort    Pain Type  Acute pain    Pain Onset  In the past 7 days    Pain Frequency  Constant         [facility] PT Assessment - 02/02/19 0001      Assessment   Medical Diagnosis  Radiculopathy, Lumbar.    Referring Provider (PT)  Earnie Larsson MD  Next MD Visit  02/2019      Precautions   Precautions  None      Restrictions   Weight Bearing Restrictions  No                   [facility] Adult PT Treatment/Exercise - 02/02/19 0001      Knee/Hip Exercises: Aerobic   Nustep  L5 x16 min      Modalities   Modalities  Electrical Stimulation;Moist Heat      Moist Heat Therapy   Number Minutes Moist Heat  15 Minutes    Moist Heat Location  Lumbar Spine      Electrical Stimulation   Electrical Stimulation Location  Left SIJ region.    Electrical Stimulation Action  Pre-Mod    Electrical Stimulation Parameters  80-150 hz x15 min    Electrical Stimulation Goals  Pain      Manual Therapy   Manual Therapy  Soft tissue mobilization    Soft tissue mobilization  STW to L SI joint and surrounding glute and paraspinals to reduce pain                PT Short Term Goals - 12/08/18 1606      PT SHORT TERM  GOAL #1   Title  STG's=LTG's.        PT Long Term Goals - 01/10/19 0949      PT LONG TERM GOAL #1   Title  Independent with an HEP.    Time  8    Period  Weeks    Status  Achieved   per MPT verbal HEP     PT LONG TERM GOAL #2   Title  Patient sit 30 minutes with pain not > 3/10.    Time  8    Period  Weeks    Status  Achieved      PT LONG TERM GOAL #3   Title  Stand 20 minutes with pain not > 3/10.    Time  8    Period  Weeks    Status  Not Met   Reports weakness in low back when standing for prolonged period 01/10/2019     PT LONG TERM GOAL #4   Title  Eliminate left LE symptoms.    Time  8    Period  Weeks    Status  Achieved   Catcing in anterior groin intermittantly 01/10/2019     PT LONG TERM GOAL #5   Title  Perform ADL's with pain not > 3/10.    Time  8    Period  Weeks    Status  Partially Met   "discomfort not pain. Uncomfortable." 01/10/2019           Plan - 02/02/19 0943    Clinical Impression Statement  Patient presented in clinic with reports of continued L SI joint pain. Patient had no complaints of increased pain during Nustep. Patient still slow and guarded with transitional movements. Normal modalities response noted following removal of the modalities. Patient did not report any increase tenderness during manual therapy.    Personal Factors and Comorbidities  Age    Examination-Activity Limitations  Bathing;Bed Mobility;Bend    Stability/Clinical Decision Making  Evolving/Moderate complexity    Rehab Potential  Excellent    PT Frequency  2x / week    PT Duration  6 weeks    PT Treatment/Interventions  ADLs/Self Care Home Management;Cryotherapy;Electrical Stimulation;Therapeutic activities;Therapeutic exercise;Patient/family education;Manual techniques  PT Next Visit Plan  Continue with lumbar strengthening per MD request. DN possible next treatment as patient has been provided DN concent form.    Consulted and Agree with Plan of Care   Patient       Patient will benefit from skilled therapeutic intervention in order to improve the following deficits and impairments:  Abnormal gait, Pain, Decreased range of motion, Decreased strength, Postural dysfunction  Visit Diagnosis: 1. Acute left-sided low back pain with left-sided sciatica   2. Abnormal posture        Problem List Patient Active Problem List   Diagnosis Date Noted  . Morbid obesity ([place]) 11/18/2018  . OA (osteoarthritis) of knee 06/08/2016  . Arthritis of knee, right 01/31/2016  . Hyperlipidemia 09/02/2015  . Pre-diabetes 07/12/2015  . Essential hypertension 07/03/2015  . Post-traumatic arthritis of ankle 12/20/2014  . Acute medial meniscal tear 02/06/2014  . Low bone mass 12/14/2012  . GAD (generalized anxiety disorder) 12/14/2012  . Vitamin D deficiency 12/14/2012    Standley Brooking, PTA 02/02/2019, 10:06 AM  [facility] [address] [place], [place], [address] Phone: [phone]   Fax:  [phone]  Name: Mary Moss MRN: [id number] Date of Birth: 23-Jun-1936

## 2019-02-06 DIAGNOSIS — N183 Chronic kidney disease, stage 3 (moderate): Secondary | ICD-10-CM | POA: Diagnosis not present

## 2019-02-06 DIAGNOSIS — Z800 Family history of malignant neoplasm of digestive organs: Secondary | ICD-10-CM | POA: Diagnosis not present

## 2019-02-06 DIAGNOSIS — E785 Hyperlipidemia, unspecified: Secondary | ICD-10-CM | POA: Diagnosis not present

## 2019-02-06 DIAGNOSIS — R7309 Other abnormal glucose: Secondary | ICD-10-CM | POA: Diagnosis not present

## 2019-02-06 DIAGNOSIS — M5416 Radiculopathy, lumbar region: Secondary | ICD-10-CM | POA: Diagnosis not present

## 2019-02-06 DIAGNOSIS — R946 Abnormal results of thyroid function studies: Secondary | ICD-10-CM | POA: Diagnosis not present

## 2019-02-06 DIAGNOSIS — E559 Vitamin D deficiency, unspecified: Secondary | ICD-10-CM | POA: Diagnosis not present

## 2019-02-06 DIAGNOSIS — I129 Hypertensive chronic kidney disease with stage 1 through stage 4 chronic kidney disease, or unspecified chronic kidney disease: Secondary | ICD-10-CM | POA: Diagnosis not present

## 2019-02-07 ENCOUNTER — Encounter: Payer: Self-pay | Admitting: Physical Therapy

## 2019-02-07 ENCOUNTER — Other Ambulatory Visit: Payer: Self-pay

## 2019-02-07 ENCOUNTER — Ambulatory Visit: Payer: Medicare Other | Admitting: Physical Therapy

## 2019-02-07 DIAGNOSIS — M533 Sacrococcygeal disorders, not elsewhere classified: Secondary | ICD-10-CM | POA: Diagnosis not present

## 2019-02-07 DIAGNOSIS — R293 Abnormal posture: Secondary | ICD-10-CM

## 2019-02-07 DIAGNOSIS — M5442 Lumbago with sciatica, left side: Secondary | ICD-10-CM | POA: Diagnosis not present

## 2019-02-07 NOTE — Therapy (Signed)
[facility] [place], [place], [address] Phone: [phone]   Fax:  [phone]  Physical Therapy Treatment  Patient Details  Name: Mary Moss MRN: [id number] Date of Birth: 29-Jul-1935 Referring Provider (PT): Earnie Larsson MD   Encounter Date: 02/07/2019  PT End of Session - 02/07/19 0913    Visit Number  17    Number of Visits  18    Date for PT Re-Evaluation  03/08/19    Authorization Type  FOTO AT LEAST EVERY 5TH VISIT.  KX MODIFIER AFTER 15 VISITS.  PROGRESS NOTE AT 10TH VISIT.    PT Start Time  0902    PT Stop Time  0950    PT Time Calculation (min)  48 min    Activity Tolerance  Patient tolerated treatment well    Behavior During Therapy  [facility] for tasks assessed/performed       Past Medical History:  Diagnosis Date  . Ankle fracture fx 13 yrs ago, anklw swells off and on, has cramps in ankle also   left, to seee dr hewitt about 02-16-14  . Arthritis   . Basal cell carcinoma 2015   Nose  . GERD (gastroesophageal reflux disease)    OTC med  . Hypertension   . Pneumonia     Past Surgical History:  Procedure Laterality Date  . ABDOMINAL HYSTERECTOMY    . EYE SURGERY Bilateral    bilaterla cataract extraction with IOL  . FRACTURE SURGERY Left 2001  . JOINT REPLACEMENT    . KNEE ARTHROSCOPY Right 02/07/2014   Procedure: RIGHT ARTHROSCOPY KNEE, WITH MEDIAL MENISCAL DEBRIDEMENT AND CHONDRAPLASTY;  Surgeon: Gearlean Alf, MD;  Location: [facility];  Service: Orthopedics;  Laterality: Right;  . ROTATOR CUFF REPAIR Right 2009  . TOTAL ANKLE ARTHROPLASTY Left 12/20/2014   Procedure: LEFT TOTAL ANKLE ARTHOPLASTY WITH PERCUTANEOUS ACHILLES TENDON LENGTHENING;  Surgeon: Wylene Simmer, MD;  Location: [facility];  Service: Orthopedics;  Laterality: Left;  . TOTAL KNEE ARTHROPLASTY Right 06/08/2016   Procedure: RIGHT TOTAL KNEE ARTHROPLASTY;  Surgeon: Gaynelle Arabian, MD;  Location: [facility];  Service: Orthopedics;  Laterality:  Right;    There were no vitals filed for this visit.  Subjective Assessment - 02/07/19 0911    Subjective  COVID 19 screening performed on patient upon arrival. Reports taking flexeril already this morning and had a hard time lifting her LEs yesterday due to pain. Is going to call Dr. Trenton Gammon after leaving PT today. Reports doing all she knows to do for her back with no improvement.    Pertinent History  HTN, Arthritis, right ankle replacement; right RTC repair and right TKA.    How long can you sit comfortably?  <10 minutes.    How long can you stand comfortably?  <10 minutes.    How long can you walk comfortably?  Around home.    Diagnostic tests  MRI.    Patient Stated Goals  Get out of pain.    Currently in Pain?  Yes    Pain Score  5     Pain Location  Back    Pain Orientation  Left;Lower    Pain Descriptors / Indicators  Discomfort    Pain Type  Acute pain    Pain Onset  1 to 4 weeks ago    Pain Frequency  Constant         [facility] PT Assessment - 02/07/19 0001      Assessment   Medical Diagnosis  Radiculopathy, Lumbar.    Referring Provider (PT)  Earnie Larsson MD    Next MD Visit  02/2019      Precautions   Precautions  None      Restrictions   Weight Bearing Restrictions  No                   [facility] Adult PT Treatment/Exercise - 02/07/19 0001      Knee/Hip Exercises: Aerobic   Nustep  L5 x16 min      Modalities   Modalities  Electrical Stimulation;Moist Heat;Ultrasound      Moist Heat Therapy   Number Minutes Moist Heat  15 Minutes    Moist Heat Location  Lumbar Spine      Electrical Stimulation   Electrical Stimulation Location  Left SIJ region.    Electrical Stimulation Action  Pre-Mod    Electrical Stimulation Parameters  80-150 hz x15 min    Electrical Stimulation Goals  Pain      Ultrasound   Ultrasound Location  L SI joint    Ultrasound Parameters  Combo 1.5 w/cm2, 100%, 1 mhz x10 min    Ultrasound Goals  Pain               PT  Short Term Goals - 12/08/18 1606      PT SHORT TERM GOAL #1   Title  STG's=LTG's.        PT Long Term Goals - 01/10/19 0949      PT LONG TERM GOAL #1   Title  Independent with an HEP.    Time  8    Period  Weeks    Status  Achieved   per MPT verbal HEP     PT LONG TERM GOAL #2   Title  Patient sit 30 minutes with pain not > 3/10.    Time  8    Period  Weeks    Status  Achieved      PT LONG TERM GOAL #3   Title  Stand 20 minutes with pain not > 3/10.    Time  8    Period  Weeks    Status  Not Met   Reports weakness in low back when standing for prolonged period 01/10/2019     PT LONG TERM GOAL #4   Title  Eliminate left LE symptoms.    Time  8    Period  Weeks    Status  Achieved   Catcing in anterior groin intermittantly 01/10/2019     PT LONG TERM GOAL #5   Title  Perform ADL's with pain not > 3/10.    Time  8    Period  Weeks    Status  Partially Met   "discomfort not pain. Uncomfortable." 01/10/2019           Plan - 02/07/19 0941    Clinical Impression Statement  Patient presented in clinic with continued excessive L SI pain. Patient limited with all ADLs due to pain and uses prescription medication as directed. No complaints during Nustep or [place] session. Patient still experiencing increased pain with any transition movements. Normal modalities response noted following removal of the modalities. Patient to contact referring MD and report back to PT office.    Personal Factors and Comorbidities  Age    Examination-Activity Limitations  Bathing;Bed Mobility;Bend    Stability/Clinical Decision Making  Evolving/Moderate complexity    Rehab Potential  Excellent    PT Frequency  2x / week  PT Duration  6 weeks    PT Treatment/Interventions  ADLs/Self Care Home Management;Cryotherapy;Electrical Stimulation;Therapeutic activities;Therapeutic exercise;Patient/family education;Manual techniques    PT Next Visit Plan  Continue per MD discretion.    Consulted and  Agree with Plan of Care  Patient       Patient will benefit from skilled therapeutic intervention in order to improve the following deficits and impairments:  Abnormal gait, Pain, Decreased range of motion, Decreased strength, Postural dysfunction  Visit Diagnosis: 1. Acute left-sided low back pain with left-sided sciatica   2. Abnormal posture        Problem List Patient Active Problem List   Diagnosis Date Noted  . Morbid obesity ([place]) 11/18/2018  . OA (osteoarthritis) of knee 06/08/2016  . Arthritis of knee, right 01/31/2016  . Hyperlipidemia 09/02/2015  . Pre-diabetes 07/12/2015  . Essential hypertension 07/03/2015  . Post-traumatic arthritis of ankle 12/20/2014  . Acute medial meniscal tear 02/06/2014  . Low bone mass 12/14/2012  . GAD (generalized anxiety disorder) 12/14/2012  . Vitamin D deficiency 12/14/2012    Standley Brooking, PTA 02/07/2019, 9:53 AM  [facility] [address] [place], [place], [address] Phone: [phone]   Fax:  [phone]  Name: Mary Moss MRN: [id number] Date of Birth: 04-18-1936

## 2019-02-08 ENCOUNTER — Encounter: Payer: Self-pay | Admitting: Family Medicine

## 2019-02-08 DIAGNOSIS — R946 Abnormal results of thyroid function studies: Secondary | ICD-10-CM | POA: Diagnosis not present

## 2019-02-08 DIAGNOSIS — N183 Chronic kidney disease, stage 3 (moderate): Secondary | ICD-10-CM | POA: Diagnosis not present

## 2019-02-08 DIAGNOSIS — E559 Vitamin D deficiency, unspecified: Secondary | ICD-10-CM | POA: Diagnosis not present

## 2019-02-08 DIAGNOSIS — Z1231 Encounter for screening mammogram for malignant neoplasm of breast: Secondary | ICD-10-CM | POA: Diagnosis not present

## 2019-02-08 DIAGNOSIS — E7849 Other hyperlipidemia: Secondary | ICD-10-CM | POA: Diagnosis not present

## 2019-02-08 DIAGNOSIS — R7309 Other abnormal glucose: Secondary | ICD-10-CM | POA: Diagnosis not present

## 2019-02-09 ENCOUNTER — Ambulatory Visit: Payer: Medicare Other | Admitting: Physical Therapy

## 2019-02-09 ENCOUNTER — Other Ambulatory Visit: Payer: Self-pay

## 2019-02-09 DIAGNOSIS — R293 Abnormal posture: Secondary | ICD-10-CM | POA: Diagnosis not present

## 2019-02-09 DIAGNOSIS — M5442 Lumbago with sciatica, left side: Secondary | ICD-10-CM | POA: Diagnosis not present

## 2019-02-09 NOTE — Therapy (Signed)
[facility] [place], [place], [address] Phone: [phone]   Fax:  [phone]  Physical Therapy Treatment  Patient Details  Name: Mary Moss MRN: [id number] Date of Birth: 11-28-1935 Referring Provider (PT): Earnie Larsson MD   Encounter Date: 02/09/2019  PT End of Session - 02/09/19 1351    Visit Number  18    Number of Visits  24    Date for PT Re-Evaluation  03/08/19    Authorization Type  FOTO AT LEAST EVERY 5TH VISIT.  KX MODIFIER AFTER 15 VISITS.  PROGRESS NOTE AT 10TH VISIT.    PT Start Time  0100    PT Stop Time  0155    PT Time Calculation (min)  55 min    Activity Tolerance  Patient tolerated treatment well       Past Medical History:  Diagnosis Date  . Ankle fracture fx 13 yrs ago, anklw swells off and on, has cramps in ankle also   left, to seee dr hewitt about 02-16-14  . Arthritis   . Basal cell carcinoma 2015   Nose  . GERD (gastroesophageal reflux disease)    OTC med  . Hypertension   . Pneumonia     Past Surgical History:  Procedure Laterality Date  . ABDOMINAL HYSTERECTOMY    . EYE SURGERY Bilateral    bilaterla cataract extraction with IOL  . FRACTURE SURGERY Left 2001  . JOINT REPLACEMENT    . KNEE ARTHROSCOPY Right 02/07/2014   Procedure: RIGHT ARTHROSCOPY KNEE, WITH MEDIAL MENISCAL DEBRIDEMENT AND CHONDRAPLASTY;  Surgeon: Gearlean Alf, MD;  [facility];  Service: Orthopedics;  Laterality: Right;  . ROTATOR CUFF REPAIR Right 2009  . TOTAL ANKLE ARTHROPLASTY Left 12/20/2014   Procedure: LEFT TOTAL ANKLE ARTHOPLASTY WITH PERCUTANEOUS ACHILLES TENDON LENGTHENING;  Surgeon: Wylene Simmer, MD;  Location: [facility];  Service: Orthopedics;  Laterality: Left;  . TOTAL KNEE ARTHROPLASTY Right 06/08/2016   Procedure: RIGHT TOTAL KNEE ARTHROPLASTY;  Surgeon: Gaynelle Arabian, MD;  [facility];  Service: Orthopedics;  Laterality: Right;    There were no vitals filed for this  visit.  Subjective Assessment - 02/09/19 1342    Subjective  COVID-19 screen performed prior to patient entering clinic.  I have intense pain everytime I step on my left.    Pertinent History  HTN, Arthritis, right ankle replacement; right RTC repair and right TKA.    How long can you sit comfortably?  <10 minutes.    How long can you stand comfortably?  <10 minutes.    How long can you walk comfortably?  Around home.    Diagnostic tests  MRI.    Patient Stated Goals  Get out of pain.    Currently in Pain?  Yes    Pain Score  7     Pain Location  Back    Pain Orientation  Left    Pain Descriptors / Indicators  Discomfort    Pain Type  Acute pain    Pain Onset  1 to 4 weeks ago                       [facility] Adult PT Treatment/Exercise - 02/09/19 0001      Modalities   Modalities  Traction      Ultrasound   Ultrasound Location  Left SIJ/upper gluteal    Ultrasound Parameters  Combo e'stim/U/S at 1.50 W/CM2 x 12 minutes.    Ultrasound Goals  Pain      Traction   Type of Traction  Lumbar    Min (lbs)  20    Max (lbs)  80    Hold Time  99    Rest Time  5    Time  15      Manual Therapy   Manual therapy comments  left femoral oscilliations x 2 minutes f/b STW/M toleft upper gluteal region and SIJ x 10 minutes.               PT Short Term Goals - 12/08/18 1606      PT SHORT TERM GOAL #1   Title  STG's=LTG's.        PT Long Term Goals - 01/10/19 0949      PT LONG TERM GOAL #1   Title  Independent with an HEP.    Time  8    Period  Weeks    Status  Achieved   per MPT verbal HEP     PT LONG TERM GOAL #2   Title  Patient sit 30 minutes with pain not > 3/10.    Time  8    Period  Weeks    Status  Achieved      PT LONG TERM GOAL #3   Title  Stand 20 minutes with pain not > 3/10.    Time  8    Period  Weeks    Status  Not Met   Reports weakness in low back when standing for prolonged period 01/10/2019     PT LONG TERM GOAL #4   Title   Eliminate left LE symptoms.    Time  8    Period  Weeks    Status  Achieved   Catcing in anterior groin intermittantly 01/10/2019     PT LONG TERM GOAL #5   Title  Perform ADL's with pain not > 3/10.    Time  8    Period  Weeks    Status  Partially Met   "discomfort not pain. Uncomfortable." 01/10/2019             Patient will benefit from skilled therapeutic intervention in order to improve the following deficits and impairments:     Visit Diagnosis: 1. Acute left-sided low back pain with left-sided sciatica   2. Abnormal posture        Problem List Patient Active Problem List   Diagnosis Date Noted  . Morbid obesity ([place]) 11/18/2018  . OA (osteoarthritis) of knee 06/08/2016  . Arthritis of knee, right 01/31/2016  . Hyperlipidemia 09/02/2015  . Pre-diabetes 07/12/2015  . Essential hypertension 07/03/2015  . Post-traumatic arthritis of ankle 12/20/2014  . Acute medial meniscal tear 02/06/2014  . Low bone mass 12/14/2012  . GAD (generalized anxiety disorder) 12/14/2012  . Vitamin D deficiency 12/14/2012    Ryaan Vanwagoner, [place] MPT 02/09/2019, 2:25 PM  [facility] [address] [place], [place], [address] Phone: [phone]   Fax:  [phone]  Name: Mary Moss MRN: [id number] Date of Birth: 1936-01-22

## 2019-02-10 ENCOUNTER — Telehealth: Payer: Self-pay | Admitting: Family

## 2019-02-10 ENCOUNTER — Other Ambulatory Visit: Payer: Self-pay | Admitting: Family

## 2019-02-10 DIAGNOSIS — R928 Other abnormal and inconclusive findings on diagnostic imaging of breast: Secondary | ICD-10-CM

## 2019-02-10 NOTE — Progress Notes (Signed)
Orders placed for [place] and diagnostic mammogram

## 2019-02-10 NOTE — Telephone Encounter (Signed)
Incoming fax from [facility] patient needs additional testing from [facility] the need an order put in for Diagnostic Mammo unilateral left breast with U/S. Please advise

## 2019-02-10 NOTE — Progress Notes (Signed)
Referral placed.

## 2019-02-14 ENCOUNTER — Ambulatory Visit: Payer: Medicare Other | Admitting: Physical Therapy

## 2019-02-14 ENCOUNTER — Other Ambulatory Visit: Payer: Self-pay

## 2019-02-14 ENCOUNTER — Encounter: Payer: Self-pay | Admitting: Physical Therapy

## 2019-02-14 DIAGNOSIS — M5442 Lumbago with sciatica, left side: Secondary | ICD-10-CM

## 2019-02-14 DIAGNOSIS — R293 Abnormal posture: Secondary | ICD-10-CM

## 2019-02-14 NOTE — Therapy (Signed)
[facility] [address] [place], [place], [address] Phone: [phone]   Fax:  [phone]  Physical Therapy Treatment  Patient Details  Name: Mary Moss MRN: [id number] Date of Birth: 05/02/1936 Referring Provider (PT): Henry Pool MD   Encounter Date: 02/14/2019  PT End of Session - 02/14/19 0852    Visit Number  19    Number of Visits  24    Date for PT Re-Evaluation  03/08/19    PT Start Time  0814    PT Stop Time  0857    PT Time Calculation (min)  43 min    Activity Tolerance  Patient tolerated treatment well    Behavior During Therapy  [facility] for tasks assessed/performed       Past Medical History:  Diagnosis Date  . Ankle fracture fx 13 yrs ago, anklw swells off and on, has cramps in ankle also   left, to seee dr hewitt about 02-16-14  . Arthritis   . Basal cell carcinoma 2015   Nose  . GERD (gastroesophageal reflux disease)    OTC med  . Hypertension   . Pneumonia     Past Surgical History:  Procedure Laterality Date  . ABDOMINAL HYSTERECTOMY    . EYE SURGERY Bilateral    bilaterla cataract extraction with IOL  . FRACTURE SURGERY Left 2001  . JOINT REPLACEMENT    . KNEE ARTHROSCOPY Right 02/07/2014   Procedure: RIGHT ARTHROSCOPY KNEE, WITH MEDIAL MENISCAL DEBRIDEMENT AND CHONDRAPLASTY;  Surgeon: Frank Aluisio V, MD;  [facility];  Service: Orthopedics;  Laterality: Right;  . ROTATOR CUFF REPAIR Right 2009  . TOTAL ANKLE ARTHROPLASTY Left 12/20/2014   Procedure: LEFT TOTAL ANKLE ARTHOPLASTY WITH PERCUTANEOUS ACHILLES TENDON LENGTHENING;  Surgeon: John Hewitt, MD;  [facility];  Service: Orthopedics;  Laterality: Left;  . TOTAL KNEE ARTHROPLASTY Right 06/08/2016   Procedure: RIGHT TOTAL KNEE ARTHROPLASTY;  Surgeon: Frank Aluisio, MD;  [facility];  Service: Orthopedics;  Laterality: Right;    There were no vitals filed for this visit.  Subjective Assessment - 02/14/19 0816    Subjective   Covid-19 screening performed prior to patient entering clinic. Patient reported increased pain after traction last treatment.    Pertinent History  HTN, Arthritis, right ankle replacement; right RTC repair and right TKA.    How long can you sit comfortably?  <10 minutes    How long can you stand comfortably?  <10 minutes    How long can you walk comfortably?  around home    Diagnostic tests  MRI    Patient Stated Goals  get out of pain    Currently in Pain?  Yes    Pain Score  6     Pain Location  Back    Pain Orientation  Left    Pain Descriptors / Indicators  Aching;Sore    Pain Type  Chronic pain    Pain Radiating Towards  down to left buttock/hip    Pain Onset  More than a month ago    Pain Frequency  Intermittent    Aggravating Factors   walking or traction    Pain Relieving Factors  at rest and seated exercises/manual STW                       [facility] Adult PT Treatment/Exercise - 02/14/19 0001      Moist Heat Therapy   Number Minutes Moist Heat  15 Minutes      Moist Heat Location  Lumbar Spine      Electrical Stimulation   Electrical Stimulation Location  Left SIJ region.    Child psychotherapist Parameters  80-150hz x48mn    Electrical Stimulation Goals  Pain      Ultrasound   Ultrasound Location  Left SIJ/upper glut    Ultrasound Parameters  combo US/ES _0 .5w/cm2/1.5w/cm2 x151m      Manual Therapy   Manual Therapy  Soft tissue mobilization;Myofascial release    Soft tissue mobilization  manual STW to left upper glut/SI to reduce pain and tightness    Myofascial Release  maunual MFR to left hip/SI to reduce tightness                PT Short Term Goals - 12/08/18 1606      PT SHORT TERM GOAL #1   Title  STG's=LTG's.        PT Long Term Goals - 02/14/19 0955      PT LONG TERM GOAL #1   Title  Independent with an HEP.    Time  8    Period  Weeks    Status  Achieved      PT LONG TERM GOAL  #2   Title  Patient sit 30 minutes with pain not > 3/10.    Time  8    Period  Weeks    Status  Achieved      PT LONG TERM GOAL #3   Title  Stand 20 minutes with pain not > 3/10.    Time  8    Period  Weeks    Status  Not Met   increased pain with prolong standing up to 6+/10 02/14/19     PT LONG TERM GOAL #4   Title  Eliminate left LE symptoms.    Time  8    Period  Weeks    Status  Achieved      PT LONG TERM GOAL #5   Title  Perform ADL's with pain not > 3/10.    Time  8    Period  Weeks    Status  On-going   pain up to 6+/10 pain with prolong ADL's 02/14/19           Plan - 02/14/19 0936    Clinical Impression Statement  Patient tolerated treatment well today. Today focused on light treatment due to increased discomfort. Patient had palpable tightness and soreness with manual STW/MFR yet felt better after treatment. Patient reponded well to modalities today. Patient would like to continue light strengthening next visit as she is unable to tolerate moderate ADL's and any prolong standing or walking. Goals progressing at this time.    Personal Factors and Comorbidities  Age    Examination-Activity Limitations  Bathing;Bed Mobility;Bend    Stability/Clinical Decision Making  Evolving/Moderate complexity    PT Next Visit Plan  Continue with POC for manual STW and modalities, consider left hip/low back strengthening per PT       Patient will benefit from skilled therapeutic intervention in order to improve the following deficits and impairments:  Abnormal gait, Pain, Decreased range of motion, Decreased strength, Postural dysfunction  Visit Diagnosis: 1. Abnormal posture   2. Acute left-sided low back pain with left-sided sciatica        Problem List Patient Active Problem List   Diagnosis Date Noted  . Morbid [place] ([place]04/24/2020  . OA (osteoarthritis) of knee 06/08/2016  .  Arthritis of knee, right 01/31/2016  . Hyperlipidemia 09/02/2015  . Pre-diabetes  07/12/2015  . Essential hypertension 07/03/2015  . Post-traumatic arthritis of ankle 12/20/2014  . Acute medial meniscal tear 02/06/2014  . Low bone mass 12/14/2012  . GAD (generalized anxiety disorder) 12/14/2012  . Vitamin D deficiency 12/14/2012    Phillips Climes, PTA 02/14/2019, 9:56 AM  [facility] [place], [place], [address] Phone: [phone]   Fax:  [phone]  Name: Mary Moss MRN: [id number] Date of Birth: 22-Mar-1936

## 2019-02-16 ENCOUNTER — Other Ambulatory Visit: Payer: Self-pay | Admitting: Family

## 2019-02-16 ENCOUNTER — Encounter: Payer: Self-pay | Admitting: Physical Therapy

## 2019-02-16 ENCOUNTER — Ambulatory Visit: Payer: Medicare Other | Admitting: Physical Therapy

## 2019-02-16 ENCOUNTER — Other Ambulatory Visit: Payer: Self-pay

## 2019-02-16 DIAGNOSIS — R928 Other abnormal and inconclusive findings on diagnostic imaging of breast: Secondary | ICD-10-CM

## 2019-02-16 DIAGNOSIS — M5442 Lumbago with sciatica, left side: Secondary | ICD-10-CM | POA: Diagnosis not present

## 2019-02-16 DIAGNOSIS — R293 Abnormal posture: Secondary | ICD-10-CM

## 2019-02-16 NOTE — Therapy (Signed)
[facility] [place], [place], [address] Phone: [phone]   Fax:  [phone]  Physical Therapy Treatment  Patient Details  Name: Mary Moss MRN: [id number] Date of Birth: 11/22/1935 Referring Provider (PT): Earnie Larsson MD   Encounter Date: 02/16/2019  PT End of Session - 02/16/19 0854    Visit Number  20    Number of Visits  24    Date for PT Re-Evaluation  03/08/19    Authorization Type  FOTO AT LEAST EVERY 5TH VISIT.  KX MODIFIER AFTER 15 VISITS.  PROGRESS NOTE AT 10TH VISIT.    PT Start Time  [phone]    PT Stop Time  0909    PT Time Calculation (min)  53 min    Activity Tolerance  Patient tolerated treatment well    Behavior During Therapy  [facility] for tasks assessed/performed       Past Medical History:  Diagnosis Date  . Ankle fracture fx 13 yrs ago, anklw swells off and on, has cramps in ankle also   left, to seee dr hewitt about 02-16-14  . Arthritis   . Basal cell carcinoma 2015   Nose  . GERD (gastroesophageal reflux disease)    OTC med  . Hypertension   . Pneumonia     Past Surgical History:  Procedure Laterality Date  . ABDOMINAL HYSTERECTOMY    . EYE SURGERY Bilateral    bilaterla cataract extraction with IOL  . FRACTURE SURGERY Left 2001  . JOINT REPLACEMENT    . KNEE ARTHROSCOPY Right 02/07/2014   Procedure: RIGHT ARTHROSCOPY KNEE, WITH MEDIAL MENISCAL DEBRIDEMENT AND CHONDRAPLASTY;  Surgeon: Gearlean Alf, MD;  Location: [facility];  Service: Orthopedics;  Laterality: Right;  . ROTATOR CUFF REPAIR Right 2009  . TOTAL ANKLE ARTHROPLASTY Left 12/20/2014   Procedure: LEFT TOTAL ANKLE ARTHOPLASTY WITH PERCUTANEOUS ACHILLES TENDON LENGTHENING;  Surgeon: Wylene Simmer, MD;  Location: [place];  Service: Orthopedics;  Laterality: Left;  . TOTAL KNEE ARTHROPLASTY Right 06/08/2016   Procedure: RIGHT TOTAL KNEE ARTHROPLASTY;  Surgeon: Gaynelle Arabian, MD;  Location: [facility];  Service: Orthopedics;  Laterality:  Right;    There were no vitals filed for this visit.  Subjective Assessment - 02/16/19 0821    Subjective  Covid-19 screening performed prior to patient entering clinic. Patient felt better after last treatment and with some ongoing discomfort today yet not as bad as usual per reported.    Pertinent History  HTN, Arthritis, right ankle replacement; right RTC repair and right TKA.    How long can you sit comfortably?  <10 minutes    How long can you stand comfortably?  <10 minutes    How long can you walk comfortably?  around home    Diagnostic tests  MRI    Patient Stated Goals  get out of pain    Currently in Pain?  Yes    Pain Score  4     Pain Location  Back    Pain Orientation  Left    Pain Descriptors / Indicators  Sore    Pain Type  Chronic pain    Pain Radiating Towards  down to left buttock/hip    Pain Onset  More than a month ago    Pain Frequency  Intermittent    Aggravating Factors   prolong walking or standing    Pain Relieving Factors  at rest  [place] Adult PT Treatment/Exercise - 02/16/19 0001      Knee/Hip Exercises: Aerobic   Nustep  L4 x64min LE only per patient request, monitored      Knee/Hip Exercises: Seated   Sit to Sand  5 reps;without UE support   fatigue     Knee/Hip Exercises: Supine   Bridges with Clamshell  Strengthening;Both;1 set;10 reps    Other Supine Knee/Hip Exercises  supine left LE abd w red t-band x20      Knee/Hip Exercises: Sidelying   Hip ABduction  Strengthening;Left;10 reps      Moist Heat Therapy   Number Minutes Moist Heat  15 Minutes    Moist Heat Location  Lumbar Spine      Electrical Stimulation   Electrical Stimulation Location  Left SIJ region.    Electrical Stimulation Action  premod    Electrical Stimulation Parameters  80-150hz  x62min    Electrical Stimulation Goals  Pain      Manual Therapy   Manual Therapy  Soft tissue mobilization;Myofascial release    Soft tissue  mobilization  manual STW to left upper glut/SI to reduce pain and tightness    Myofascial Release  maunual MFR to left hip/SI to reduce tightness                PT Short Term Goals - 12/08/18 1606      PT SHORT TERM GOAL #1   Title  STG's=LTG's.        PT Long Term Goals - 02/16/19 0856      PT LONG TERM GOAL #1   Title  Independent with an HEP.    Time  8    Period  Weeks    Status  Achieved      PT LONG TERM GOAL #2   Title  Patient sit 30 minutes with pain not > 3/10.    Period  Weeks    Status  Achieved      PT LONG TERM GOAL #3   Title  Stand 20 minutes with pain not > 3/10.    Time  8    Period  Weeks    Status  On-going   can be up to 4-6+/10 at times 02/16/19     PT LONG TERM GOAL #4   Title  Eliminate left LE symptoms.    Time  8    Period  Weeks    Status  Achieved      PT LONG TERM GOAL #5   Title  Perform ADL's with pain not > 3/10.    Time  8    Period  Weeks    Status  On-going   pain 4-6+/10 at times 02/16/19           Plan - 02/16/19 0906    Clinical Impression Statement  Patient tolerated treatment well today. Patient able to progress with gentle to moderate exercises/strengtheing today to stabalize muscles. Patient continues to have palpable pain yet level has decreased. Patient improved FOTO score and less limitations overall. Goals ongoing yet progressing.    Personal Factors and Comorbidities  Age    Examination-Activity Limitations  Bathing;Bed Mobility;Bend    Stability/Clinical Decision Making  Evolving/Moderate complexity    Rehab Potential  Excellent    PT Frequency  2x / week    PT Duration  6 weeks    PT Treatment/Interventions  ADLs/Self Care Home Management;Cryotherapy;Electrical Stimulation;Therapeutic activities;Therapeutic exercise;Patient/family education;Manual techniques    PT Next Visit Plan  Continue with  POC for manual STW and modalities, consider left hip/low back strengthening per PT    Consulted and Agree  with Plan of Care  Patient       Patient will benefit from skilled therapeutic intervention in order to improve the following deficits and impairments:  Abnormal gait, Pain, Decreased range of motion, Decreased strength, Postural dysfunction  Visit Diagnosis: 1. Acute left-sided low back pain with left-sided sciatica   2. Abnormal posture        Problem List Patient Active Problem List   Diagnosis Date Noted  . Morbid obesity ([place]) 11/18/2018  . OA (osteoarthritis) of knee 06/08/2016  . Arthritis of knee, right 01/31/2016  . Hyperlipidemia 09/02/2015  . Pre-diabetes 07/12/2015  . Essential hypertension 07/03/2015  . Post-traumatic arthritis of ankle 12/20/2014  . Acute medial meniscal tear 02/06/2014  . Low bone mass 12/14/2012  . GAD (generalized anxiety disorder) 12/14/2012  . Vitamin D deficiency 12/14/2012   Ladean Raya, PTA 02/16/19 9:14 AM  [facility] [place], [place], [address] Phone: [phone]   Fax:  [phone]  Name: Mary Moss MRN: [id number] Date of Birth: February 14, 1936  Progress Note Reporting Period 12/08/18 to 02/16/19  See note below for Objective Data and Assessment of Progress/Goals. Patient continues to have left low back/SIJ pain but over the last couple of days she has been better.  She has also achieved LTG's #1, #2 and #4.    Mali Applegate MPT

## 2019-02-20 ENCOUNTER — Encounter: Payer: Self-pay | Admitting: Family Medicine

## 2019-02-20 DIAGNOSIS — R921 Mammographic calcification found on diagnostic imaging of breast: Secondary | ICD-10-CM | POA: Diagnosis not present

## 2019-02-20 DIAGNOSIS — R922 Inconclusive mammogram: Secondary | ICD-10-CM | POA: Diagnosis not present

## 2019-02-21 ENCOUNTER — Other Ambulatory Visit: Payer: Self-pay

## 2019-02-21 ENCOUNTER — Encounter: Payer: Self-pay | Admitting: Physical Therapy

## 2019-02-21 ENCOUNTER — Ambulatory Visit: Payer: Medicare Other | Admitting: Physical Therapy

## 2019-02-21 DIAGNOSIS — R293 Abnormal posture: Secondary | ICD-10-CM

## 2019-02-21 DIAGNOSIS — M5442 Lumbago with sciatica, left side: Secondary | ICD-10-CM

## 2019-02-21 NOTE — Therapy (Signed)
[facility] [place], [place], [address] Phone: [phone]   Fax:  [phone]  Physical Therapy Treatment  Patient Details  Name: Mary Moss MRN: [id number] Date of Birth: 1935-12-30 Referring Provider (PT): Earnie Larsson MD   Encounter Date: 02/21/2019  PT End of Session - 02/21/19 0903    Visit Number  21    Number of Visits  24    Date for PT Re-Evaluation  03/08/19    Authorization Type  FOTO AT LEAST EVERY 5TH VISIT.  KX MODIFIER AFTER 15 VISITS.  PROGRESS NOTE AT 10TH VISIT.    PT Start Time  0815    PT Stop Time  0903    PT Time Calculation (min)  48 min    Activity Tolerance  Patient tolerated treatment well    Behavior During Therapy  [facility] for tasks assessed/performed       Past Medical History:  Diagnosis Date  . Ankle fracture fx 13 yrs ago, anklw swells off and on, has cramps in ankle also   left, to seee dr hewitt about 02-16-14  . Arthritis   . Basal cell carcinoma 2015   Nose  . GERD (gastroesophageal reflux disease)    OTC med  . Hypertension   . Pneumonia     Past Surgical History:  Procedure Laterality Date  . ABDOMINAL HYSTERECTOMY    . EYE SURGERY Bilateral    bilaterla cataract extraction with IOL  . FRACTURE SURGERY Left 2001  . JOINT REPLACEMENT    . KNEE ARTHROSCOPY Right 02/07/2014   Procedure: RIGHT ARTHROSCOPY KNEE, WITH MEDIAL MENISCAL DEBRIDEMENT AND CHONDRAPLASTY;  Surgeon: Gearlean Alf, MD;  [facility]: [facility];  Service: Orthopedics;  Laterality: Right;  . ROTATOR CUFF REPAIR Right 2009  . TOTAL ANKLE ARTHROPLASTY Left 12/20/2014   Procedure: LEFT TOTAL ANKLE ARTHOPLASTY WITH PERCUTANEOUS ACHILLES TENDON LENGTHENING;  Surgeon: Wylene Simmer, MD;  Location: [place];  Service: Orthopedics;  Laterality: Left;  . TOTAL KNEE ARTHROPLASTY Right 06/08/2016   Procedure: RIGHT TOTAL KNEE ARTHROPLASTY;  Surgeon: Gaynelle Arabian, MD;  Location: [facility];  Service: Orthopedics;  Laterality:  Right;    There were no vitals filed for this visit.  Subjective Assessment - 02/21/19 0829    Subjective  Covid-19 screening performed prior to patient entering clinic. Reports yesterday wasn't too bad but still has ongoing 5/10 pain with walking.    Pertinent History  HTN, Arthritis, right ankle replacement; right RTC repair and right TKA.    How long can you sit comfortably?  <10 minutes    How long can you stand comfortably?  <10 minutes    How long can you walk comfortably?  around home    Diagnostic tests  MRI    Patient Stated Goals  get out of pain    Currently in Pain?  Yes    Pain Score  5     Pain Location  Back    Pain Orientation  Left    Pain Descriptors / Indicators  Sore    Pain Type  Chronic pain         [facility] PT Assessment - 02/21/19 0001      Assessment   Medical Diagnosis  Radiculopathy, Lumbar.    Referring Provider (PT)  Earnie Larsson MD    Next MD Visit  02/2019      Precautions   Precautions  None      Restrictions   Weight Bearing Restrictions  No  [place] Adult PT Treatment/Exercise - 02/21/19 0001      Knee/Hip Exercises: Aerobic   Nustep  L5 x12min LE only per patient request, monitored      Moist Heat Therapy   Number Minutes Moist Heat  10 Minutes    Moist Heat Location  Lumbar Spine      Electrical Stimulation   Electrical Stimulation Location  Left SIJ region.    Electrical Stimulation Action  pre-mod    Electrical Stimulation Parameters  80-150 hz x10 min    Electrical Stimulation Goals  Pain      Manual Therapy   Manual Therapy  Soft tissue mobilization;Myofascial release    Soft tissue mobilization  manual STW to left upper glut/SI to reduce pain and tightness               PT Short Term Goals - 12/08/18 1606      PT SHORT TERM GOAL #1   Title  STG's=LTG's.        PT Long Term Goals - 02/16/19 0856      PT LONG TERM GOAL #1   Title  Independent with an HEP.    Time  8    Period   Weeks    Status  Achieved      PT LONG TERM GOAL #2   Title  Patient sit 30 minutes with pain not > 3/10.    Period  Weeks    Status  Achieved      PT LONG TERM GOAL #3   Title  Stand 20 minutes with pain not > 3/10.    Time  8    Period  Weeks    Status  On-going   can be up to 4-6+/10 at times 02/16/19     PT LONG TERM GOAL #4   Title  Eliminate left LE symptoms.    Time  8    Period  Weeks    Status  Achieved      PT LONG TERM GOAL #5   Title  Perform ADL's with pain not > 3/10.    Time  8    Period  Weeks    Status  On-going   pain 4-6+/10 at times 02/16/19           Plan - 02/21/19 0926    Clinical Impression Statement  Patient was able to tolerate treatment fairly well but still with ongoing pain especially with walking. Patient responded well to STW/M to left SIJ and upper gluteal area with a decrease in pain after. Patient reports pain relief is temporary for a couple of hours then slowly returns. No adverse affects noted upon removal of modalities.    Personal Factors and Comorbidities  Age    Examination-Activity Limitations  Bathing;Bed Mobility;Bend    Stability/Clinical Decision Making  Evolving/Moderate complexity    Clinical Decision Making  Low    Rehab Potential  Excellent    PT Frequency  2x / week    PT Duration  6 weeks    PT Treatment/Interventions  ADLs/Self Care Home Management;Cryotherapy;Electrical Stimulation;Therapeutic activities;Therapeutic exercise;Patient/family education;Manual techniques    PT Next Visit Plan  Continue with POC for manual STW and modalities, consider left hip/low back strengthening per PT    Consulted and Agree with Plan of Care  Patient       Patient will benefit from skilled therapeutic intervention in order to improve the following deficits and impairments:  Abnormal gait, Pain, Decreased range of motion, Decreased strength,  Postural dysfunction  Visit Diagnosis: 1. Acute left-sided low back pain with left-sided  sciatica   2. Abnormal posture        Problem List Patient Active Problem List   Diagnosis Date Noted  . Morbid obesity ([place]) 11/18/2018  . OA (osteoarthritis) of knee 06/08/2016  . Arthritis of knee, right 01/31/2016  . Hyperlipidemia 09/02/2015  . Pre-diabetes 07/12/2015  . Essential hypertension 07/03/2015  . Post-traumatic arthritis of ankle 12/20/2014  . Acute medial meniscal tear 02/06/2014  . Low bone mass 12/14/2012  . GAD (generalized anxiety disorder) 12/14/2012  . Vitamin D deficiency 12/14/2012   Gabriela Eves, PT, DPT 02/21/2019, 9:29 AM  [facility] [address] [place], [place], [address] Phone: [phone]   Fax:  [phone]  Name: Mary Moss MRN: [id number] Date of Birth: [birth year]

## 2019-02-23 ENCOUNTER — Ambulatory Visit: Payer: Medicare Other | Admitting: Physical Therapy

## 2019-02-23 ENCOUNTER — Encounter: Payer: Self-pay | Admitting: Physical Therapy

## 2019-02-23 ENCOUNTER — Other Ambulatory Visit: Payer: Self-pay

## 2019-02-23 DIAGNOSIS — M5442 Lumbago with sciatica, left side: Secondary | ICD-10-CM | POA: Diagnosis not present

## 2019-02-23 DIAGNOSIS — R293 Abnormal posture: Secondary | ICD-10-CM | POA: Diagnosis not present

## 2019-02-23 NOTE — Therapy (Signed)
[facility] [place], [place], [address] Phone: [phone]   Fax:  [phone]  Physical Therapy Treatment  Patient Details  Name: Mary Moss MRN: [id number] Date of Birth: 02-13-1936 Referring Provider (PT): Earnie Larsson MD   Encounter Date: 02/23/2019  PT End of Session - 02/23/19 0854    Visit Number  22    Number of Visits  24    Date for PT Re-Evaluation  03/08/19    Authorization Type  FOTO AT LEAST EVERY 5TH VISIT.  KX MODIFIER AFTER 15 VISITS.  PROGRESS NOTE AT 10TH VISIT.    PT Start Time  [phone]    PT Stop Time  0906    PT Time Calculation (min)  50 min    Activity Tolerance  Patient tolerated treatment well    Behavior During Therapy  [facility] for tasks assessed/performed       Past Medical History:  Diagnosis Date  . Ankle fracture fx 13 yrs ago, anklw swells off and on, has cramps in ankle also   left, to seee dr hewitt about 02-16-14  . Arthritis   . Basal cell carcinoma 2015   Nose  . GERD (gastroesophageal reflux disease)    OTC med  . Hypertension   . Pneumonia     Past Surgical History:  Procedure Laterality Date  . ABDOMINAL HYSTERECTOMY    . EYE SURGERY Bilateral    bilaterla cataract extraction with IOL  . FRACTURE SURGERY Left 2001  . JOINT REPLACEMENT    . KNEE ARTHROSCOPY Right 02/07/2014   Procedure: RIGHT ARTHROSCOPY KNEE, WITH MEDIAL MENISCAL DEBRIDEMENT AND CHONDRAPLASTY;  Surgeon: Gearlean Alf, MD;  Location: [facility];  Service: Orthopedics;  Laterality: Right;  . ROTATOR CUFF REPAIR Right 2009  . TOTAL ANKLE ARTHROPLASTY Left 12/20/2014   Procedure: LEFT TOTAL ANKLE ARTHOPLASTY WITH PERCUTANEOUS ACHILLES TENDON LENGTHENING;  Surgeon: Wylene Simmer, MD;  Location: [place];  Service: Orthopedics;  Laterality: Left;  . TOTAL KNEE ARTHROPLASTY Right 06/08/2016   Procedure: RIGHT TOTAL KNEE ARTHROPLASTY;  Surgeon: Gaynelle Arabian, MD;  [facility];  Service: Orthopedics;  Laterality:  Right;    There were no vitals filed for this visit.  Subjective Assessment - 02/23/19 0820    Subjective  Covid-19 screening performed prior to patient entering clinic. Patient arrived with no new complaints, just ongoing pain in left hip    Pertinent History  HTN, Arthritis, right ankle replacement; right RTC repair and right TKA.    How long can you sit comfortably?  <10 minutes    How long can you stand comfortably?  <10 minutes    How long can you walk comfortably?  around home    Diagnostic tests  MRI    Patient Stated Goals  get out of pain    Currently in Pain?  Yes    Pain Score  4     Pain Location  Back    Pain Orientation  Left    Pain Descriptors / Indicators  Sore    Pain Type  Chronic pain    Pain Onset  More than a month ago    Pain Frequency  Intermittent    Aggravating Factors   prolong standing or walking    Pain Relieving Factors  at rest, STW                       [facility] Adult PT Treatment/Exercise - 02/23/19 0001  Knee/Hip Exercises: Aerobic   Nustep  L5 x65min LE only per patient request, monitored      Moist Heat Therapy   Number Minutes Moist Heat  15 Minutes    Moist Heat Location  Lumbar Spine      Electrical Stimulation   Electrical Stimulation Location  Left SIJ region.    Electrical Stimulation Action  premod    Electrical Stimulation Parameters  80-150hz  62min    Electrical Stimulation Goals  Pain      Manual Therapy   Manual Therapy  Soft tissue mobilization;Myofascial release    Soft tissue mobilization  manual STW to left upper glut/SI to reduce pain and tightness    Myofascial Release  maunual MFR to left hip/SI to reduce tightness                PT Short Term Goals - 12/08/18 1606      PT SHORT TERM GOAL #1   Title  STG's=LTG's.        PT Long Term Goals - 02/16/19 0856      PT LONG TERM GOAL #1   Title  Independent with an HEP.    Time  8    Period  Weeks    Status  Achieved      PT LONG  TERM GOAL #2   Title  Patient sit 30 minutes with pain not > 3/10.    Period  Weeks    Status  Achieved      PT LONG TERM GOAL #3   Title  Stand 20 minutes with pain not > 3/10.    Time  8    Period  Weeks    Status  On-going   can be up to 4-6+/10 at times 02/16/19     PT LONG TERM GOAL #4   Title  Eliminate left LE symptoms.    Time  8    Period  Weeks    Status  Achieved      PT LONG TERM GOAL #5   Title  Perform ADL's with pain not > 3/10.    Time  8    Period  Weeks    Status  On-going   pain 4-6+/10 at times 02/16/19           Plan - 02/23/19 0856    Clinical Impression Statement  Patient tolerated treatment well today. Patient has ongoing discomfort yet eases with STW and rest. Patient will be getting a shot in a few weeks. Patient will DC next week. Patient doing exercises as able to home yet increased activity increases pain. Goals ongoing at this time.    Personal Factors and Comorbidities  Age    Examination-Activity Limitations  Bathing;Bed Mobility;Bend    Stability/Clinical Decision Making  Evolving/Moderate complexity    Rehab Potential  Excellent    PT Frequency  2x / week    PT Duration  6 weeks    PT Treatment/Interventions  ADLs/Self Care Home Management;Cryotherapy;Electrical Stimulation;Therapeutic activities;Therapeutic exercise;Patient/family education;Manual techniques    PT Next Visit Plan  Continue with POC for manual STW and modalities, consider left hip/low back strengthening per PT DC next week    Consulted and Agree with Plan of Care  Patient       Patient will benefit from skilled therapeutic intervention in order to improve the following deficits and impairments:  Abnormal gait, Pain, Decreased range of motion, Decreased strength, Postural dysfunction  Visit Diagnosis: 1. Acute left-sided low back pain with left-sided  sciatica   2. Abnormal posture        Problem List Patient Active Problem List   Diagnosis Date Noted  . Morbid  obesity ([place]) 11/18/2018  . OA (osteoarthritis) of knee 06/08/2016  . Arthritis of knee, right 01/31/2016  . Hyperlipidemia 09/02/2015  . Pre-diabetes 07/12/2015  . Essential hypertension 07/03/2015  . Post-traumatic arthritis of ankle 12/20/2014  . Acute medial meniscal tear 02/06/2014  . Low bone mass 12/14/2012  . GAD (generalized anxiety disorder) 12/14/2012  . Vitamin D deficiency 12/14/2012    DUNFORD, CHRISTINA P, PTA 02/23/2019, 9:10 AM  [facility] [place], [place], [address] Phone: [phone]   Fax:  [phone]  Name: Mary Moss MRN: [id number] Date of Birth: 12/20/1935

## 2019-02-25 DIAGNOSIS — R300 Dysuria: Secondary | ICD-10-CM | POA: Diagnosis not present

## 2019-02-27 ENCOUNTER — Other Ambulatory Visit: Payer: Self-pay

## 2019-02-27 DIAGNOSIS — N6489 Other specified disorders of breast: Secondary | ICD-10-CM | POA: Diagnosis not present

## 2019-02-27 DIAGNOSIS — R921 Mammographic calcification found on diagnostic imaging of breast: Secondary | ICD-10-CM | POA: Diagnosis not present

## 2019-02-28 ENCOUNTER — Ambulatory Visit: Payer: Medicare Other | Attending: Neurosurgery | Admitting: Physical Therapy

## 2019-02-28 ENCOUNTER — Other Ambulatory Visit: Payer: Self-pay

## 2019-02-28 ENCOUNTER — Encounter: Payer: Self-pay | Admitting: Physical Therapy

## 2019-02-28 DIAGNOSIS — M5442 Lumbago with sciatica, left side: Secondary | ICD-10-CM | POA: Insufficient documentation

## 2019-02-28 DIAGNOSIS — R293 Abnormal posture: Secondary | ICD-10-CM | POA: Insufficient documentation

## 2019-02-28 NOTE — Therapy (Signed)
[facility] [place], [place], [address] Phone: [phone]   Fax:  [phone]  Physical Therapy Treatment  Patient Details  Name: Mary Moss MRN: [id number] Date of Birth: [birth year] Referring Provider (PT): Earnie Larsson MD   Encounter Date: 02/28/2019  PT End of Session - 02/28/19 0904    Visit Number  23    Number of Visits  24    Date for PT Re-Evaluation  03/08/19    Authorization Type  FOTO AT LEAST EVERY 5TH VISIT.  KX MODIFIER AFTER 15 VISITS.  PROGRESS NOTE AT 10TH VISIT.    PT Start Time  0815    PT Stop Time  0914    PT Time Calculation (min)  59 min    Activity Tolerance  Patient tolerated treatment well    Behavior During Therapy  [facility] for tasks assessed/performed       Past Medical History:  Diagnosis Date  . Ankle fracture fx 13 yrs ago, anklw swells off and on, has cramps in ankle also   left, to seee dr hewitt about 02-16-14  . Arthritis   . Basal cell carcinoma 2015   Nose  . GERD (gastroesophageal reflux disease)    OTC med  . Hypertension   . Pneumonia     Past Surgical History:  Procedure Laterality Date  . ABDOMINAL HYSTERECTOMY    . EYE SURGERY Bilateral    bilaterla cataract extraction with IOL  . FRACTURE SURGERY Left 2001  . JOINT REPLACEMENT    . KNEE ARTHROSCOPY Right 02/07/2014   Procedure: RIGHT ARTHROSCOPY KNEE, WITH MEDIAL MENISCAL DEBRIDEMENT AND CHONDRAPLASTY;  Surgeon: Gearlean Alf, MD;  Location: [facility];  Service: Orthopedics;  Laterality: Right;  . ROTATOR CUFF REPAIR Right 2009  . TOTAL ANKLE ARTHROPLASTY Left 12/20/2014   Procedure: LEFT TOTAL ANKLE ARTHOPLASTY WITH PERCUTANEOUS ACHILLES TENDON LENGTHENING;  Surgeon: Wylene Simmer, MD;  Location: [facility];  Service: Orthopedics;  Laterality: Left;  . TOTAL KNEE ARTHROPLASTY Right 06/08/2016   Procedure: RIGHT TOTAL KNEE ARTHROPLASTY;  Surgeon: Gaynelle Arabian, MD;  Location: [facility];  Service: Orthopedics;  Laterality:  Right;    There were no vitals filed for this visit.  Subjective Assessment - 02/28/19 0824    Subjective  COVID-19 screen performed prior to patient entering clinic.  Getting injection on 8/24.    Pertinent History  HTN, Arthritis, right ankle replacement; right RTC repair and right TKA.    How long can you sit comfortably?  <10 minutes    How long can you walk comfortably?  around home    Diagnostic tests  MRI    Patient Stated Goals  get out of pain    Currently in Pain?  Yes    Pain Score  5     Pain Location  Back    Pain Orientation  Left    Pain Descriptors / Indicators  Sore    Pain Type  Chronic pain    Pain Onset  More than a month ago                       [facility] Adult PT Treatment/Exercise - 02/28/19 0001      Exercises   Exercises  Knee/Hip      Knee/Hip Exercises: Aerobic   Nustep  Level 5 x 10 minutes.      Modalities   Modalities  Electrical Stimulation;Moist Heat;Ultrasound      Moist Heat Therapy  Number Minutes Moist Heat  20 Minutes    Moist Heat Location  Lumbar Spine      Electrical Stimulation   Electrical Stimulation Location  Left SIJ region.    Electrical Stimulation Action  Pre-mod.    Electrical Stimulation Parameters  80-150 Hz x 20 minutes.    Electrical Stimulation Goals  Pain      Ultrasound   Ultrasound Location  Left SIJ.    Ultrasound Parameters  Combo e'stim/U/S at 1.50 W/CM2 x 8 minutes.      Manual Therapy   Manual Therapy  Soft tissue mobilization    Soft tissue mobilization  STW/M x 5 minutes to patient's left SIJ region.               PT Short Term Goals - 12/08/18 1606      PT SHORT TERM GOAL #1   Title  STG's=LTG's.        PT Long Term Goals - 02/16/19 0856      PT LONG TERM GOAL #1   Title  Independent with an HEP.    Time  8    Period  Weeks    Status  Achieved      PT LONG TERM GOAL #2   Title  Patient sit 30 minutes with pain not > 3/10.    Period  Weeks    Status  Achieved       PT LONG TERM GOAL #3   Title  Stand 20 minutes with pain not > 3/10.    Time  8    Period  Weeks    Status  On-going   can be up to 4-6+/10 at times 02/16/19     PT LONG TERM GOAL #4   Title  Eliminate left LE symptoms.    Time  8    Period  Weeks    Status  Achieved      PT LONG TERM GOAL #5   Title  Perform ADL's with pain not > 3/10.    Time  8    Period  Weeks    Status  On-going   pain 4-6+/10 at times 02/16/19           Plan - 02/28/19 2355    Clinical Impression Statement  The patient continues to have left SIJ pain that is much worse upon walking.  My left leg pain has gone away though.    PT Next Visit Plan  FOTO and discharge.       Patient will benefit from skilled therapeutic intervention in order to improve the following deficits and impairments:     Visit Diagnosis: 1. Acute left-sided low back pain with left-sided sciatica   2. Abnormal posture        Problem List Patient Active Problem List   Diagnosis Date Noted  . Morbid obesity ([place]) 11/18/2018  . OA (osteoarthritis) of knee 06/08/2016  . Arthritis of knee, right 01/31/2016  . Hyperlipidemia 09/02/2015  . Pre-diabetes 07/12/2015  . Essential hypertension 07/03/2015  . Post-traumatic arthritis of ankle 12/20/2014  . Acute medial meniscal tear 02/06/2014  . Low bone mass 12/14/2012  . GAD (generalized anxiety disorder) 12/14/2012  . Vitamin D deficiency 12/14/2012    Avon Molock, [place] MPT 02/28/2019, 9:14 AM  [facility] [address] [place], [place], [address] Phone: [phone]   Fax:  [phone]  Name: Mary Moss MRN: [id number] Date of Birth: 1936-07-08

## 2019-03-02 ENCOUNTER — Encounter: Payer: Self-pay | Admitting: Physical Therapy

## 2019-03-02 ENCOUNTER — Ambulatory Visit: Payer: Medicare Other | Admitting: Physical Therapy

## 2019-03-02 ENCOUNTER — Other Ambulatory Visit: Payer: Self-pay

## 2019-03-02 DIAGNOSIS — M5442 Lumbago with sciatica, left side: Secondary | ICD-10-CM

## 2019-03-02 DIAGNOSIS — R293 Abnormal posture: Secondary | ICD-10-CM | POA: Diagnosis not present

## 2019-03-02 DIAGNOSIS — L280 Lichen simplex chronicus: Secondary | ICD-10-CM | POA: Diagnosis not present

## 2019-03-02 DIAGNOSIS — N763 Subacute and chronic vulvitis: Secondary | ICD-10-CM | POA: Diagnosis not present

## 2019-03-02 NOTE — Therapy (Signed)
[facility] [place], [place], [address] Phone: [phone]   Fax:  [phone]  Physical Therapy Treatment PHYSICAL THERAPY DISCHARGE SUMMARY  Visits from Start of Care: 24  Current functional level related to goals / functional outcomes: See below   Remaining deficits: See goals   Education / Equipment: HEP Plan: Patient agrees to discharge.  Patient goals were partially met. Patient is being discharged due to lack of progress.  ?????     Patient Details  Name: Mary Moss MRN: [id number] Date of Birth: 1936/06/02 Referring Provider (PT): Earnie Larsson MD   Encounter Date: 03/02/2019  PT End of Session - 03/02/19 0854    Visit Number  24    Number of Visits  24    Date for PT Re-Evaluation  03/08/19    Authorization Type  FOTO AT LEAST EVERY 5TH VISIT.  KX MODIFIER AFTER 15 VISITS.  PROGRESS NOTE AT 10TH VISIT.    PT Start Time  0815    PT Stop Time  0908    PT Time Calculation (min)  53 min    Activity Tolerance  Patient tolerated treatment well    Behavior During Therapy  [facility] for tasks assessed/performed       Past Medical History:  Diagnosis Date  . Ankle fracture fx 13 yrs ago, anklw swells off and on, has cramps in ankle also   left, to seee dr hewitt about 02-16-14  . Arthritis   . Basal cell carcinoma 2015   Nose  . GERD (gastroesophageal reflux disease)    OTC med  . Hypertension   . Pneumonia     Past Surgical History:  Procedure Laterality Date  . ABDOMINAL HYSTERECTOMY    . EYE SURGERY Bilateral    bilaterla cataract extraction with IOL  . FRACTURE SURGERY Left 2001  . JOINT REPLACEMENT    . KNEE ARTHROSCOPY Right 02/07/2014   Procedure: RIGHT ARTHROSCOPY KNEE, WITH MEDIAL MENISCAL DEBRIDEMENT AND CHONDRAPLASTY;  Surgeon: Gearlean Alf, MD;  Location: [facility];  Service: Orthopedics;  Laterality: Right;  . ROTATOR CUFF REPAIR Right 2009  . TOTAL ANKLE ARTHROPLASTY Left 12/20/2014   Procedure: LEFT TOTAL ANKLE ARTHOPLASTY WITH PERCUTANEOUS ACHILLES TENDON LENGTHENING;  Surgeon: Wylene Simmer, MD;  Location: [facility];  Service: Orthopedics;  Laterality: Left;  . TOTAL KNEE ARTHROPLASTY Right 06/08/2016   Procedure: RIGHT TOTAL KNEE ARTHROPLASTY;  Surgeon: Gaynelle Arabian, MD;  Location[facility];  Service: Orthopedics;  Laterality: Right;    There were no vitals filed for this visit.  Subjective Assessment - 03/02/19 0825    Subjective  COVID-19 screen performed prior to patient entering clinic.  Getting injection on 8/24. Some ongoing discomfort    Pertinent History  HTN, Arthritis, right ankle replacement; right RTC repair and right TKA.    How long can you sit comfortably?  <10 minutes    How long can you stand comfortably?  <10 minutes    How long can you walk comfortably?  around home    Diagnostic tests  MRI    Patient Stated Goals  get out of pain    Currently in Pain?  Yes    Pain Score  5     Pain Location  Back    Pain Orientation  Left    Pain Descriptors / Indicators  Discomfort    Pain Type  Chronic pain    Pain Onset  More than a month ago    Pain Frequency  Intermittent    Aggravating Factors   any prolong walking or standing    Pain Relieving Factors  at rest                       [facility] Adult PT Treatment/Exercise - 03/02/19 0001      Knee/Hip Exercises: Aerobic   Nustep  Level 5 x 12 min LE only, monitored      Moist Heat Therapy   Number Minutes Moist Heat  20 Minutes    Moist Heat Location  Lumbar Spine      Electrical Stimulation   Electrical Stimulation Location  Left SIJ region.    Electrical Stimulation Action  premod    Electrical Stimulation Parameters  80-_0  x54mn    Electrical Stimulation Goals  Pain      Manual Therapy   Manual Therapy  Myofascial release;Soft tissue mobilization    Soft tissue mobilization  manual STW to left upper glut/SI to reduce pain and tightness    Myofascial Release  maunual MFR to left  hip/SI to reduce tightness                PT Short Term Goals - 12/08/18 1606      PT SHORT TERM GOAL #1   Title  STG's=LTG's.        PT Long Term Goals - 03/02/19 0855      PT LONG TERM GOAL #1   Title  Independent with an HEP.    Time  8    Period  Weeks    Status  Achieved      PT LONG TERM GOAL #2   Title  Patient sit 30 minutes with pain not > 3/10.    Time  8    Period  Weeks    Status  Achieved      PT LONG TERM GOAL #3   Title  Stand 20 minutes with pain not > 3/10.    Time  8    Period  Weeks    Status  Not Met   unable to stand 20 min with pain at 5/10 03/02/19     PT LONG TERM GOAL #4   Title  Eliminate left LE symptoms.    Time  8    Period  Weeks    Status  Achieved      PT LONG TERM GOAL #5   Title  Perform ADL's with pain not > 3/10.    Time  8    Period  Weeks    Status  Achieved   Able to perform her ADL's with 2-3/10 03/02/19           Plan - 03/02/19 1209    Clinical Impression Statement  Patient tolerated treatment fair today. She feels overall improvement yet still limitations and pain with any prolong walking or standing. Patient unable to meet all goals    Personal Factors and Comorbidities  Age    Examination-Activity Limitations  Bathing;Bed Mobility;Bend    Stability/Clinical Decision Making  Evolving/Moderate complexity    Rehab Potential  Excellent    PT Frequency  2x / week    PT Duration  6 weeks    PT Treatment/Interventions  ADLs/Self Care Home Management;Cryotherapy;Electrical Stimulation;Therapeutic activities;Therapeutic exercise;Patient/family education;Manual techniques    PT Next Visit Plan  DC    Consulted and Agree with Plan of Care  Patient       Patient will benefit from skilled therapeutic intervention in order  to improve the following deficits and impairments:  Abnormal gait, Pain, Decreased range of motion, Decreased strength, Postural dysfunction  Visit Diagnosis: 1. Abnormal posture   2. Acute  left-sided low back pain with left-sided sciatica        Problem List Patient Active Problem List   Diagnosis Date Noted  . Morbid obesity ([place]) 11/18/2018  . OA (osteoarthritis) of knee 06/08/2016  . Arthritis of knee, right 01/31/2016  . Hyperlipidemia 09/02/2015  . Pre-diabetes 07/12/2015  . Essential hypertension 07/03/2015  . Post-traumatic arthritis of ankle 12/20/2014  . Acute medial meniscal tear 02/06/2014  . Low bone mass 12/14/2012  . GAD (generalized anxiety disorder) 12/14/2012  . Vitamin D deficiency 12/14/2012    Ladean Raya, PTA 03/02/19 12:11 PM  [facility] [address] [place], [place], [address] Phone: [phone]   Fax:  [phone]  Name: Mary Moss MRN: [id number] Date of Birth: 04-26-1936

## 2019-03-20 DIAGNOSIS — M533 Sacrococcygeal disorders, not elsewhere classified: Secondary | ICD-10-CM | POA: Diagnosis not present

## 2019-03-30 DIAGNOSIS — N763 Subacute and chronic vulvitis: Secondary | ICD-10-CM | POA: Diagnosis not present

## 2019-03-30 DIAGNOSIS — L280 Lichen simplex chronicus: Secondary | ICD-10-CM | POA: Diagnosis not present

## 2019-04-12 DIAGNOSIS — M533 Sacrococcygeal disorders, not elsewhere classified: Secondary | ICD-10-CM | POA: Diagnosis not present

## 2019-04-12 DIAGNOSIS — Z6832 Body mass index (BMI) 32.0-32.9, adult: Secondary | ICD-10-CM | POA: Diagnosis not present

## 2019-04-12 DIAGNOSIS — I10 Essential (primary) hypertension: Secondary | ICD-10-CM | POA: Diagnosis not present

## 2019-05-01 DIAGNOSIS — L821 Other seborrheic keratosis: Secondary | ICD-10-CM | POA: Diagnosis not present

## 2019-05-01 DIAGNOSIS — D1801 Hemangioma of skin and subcutaneous tissue: Secondary | ICD-10-CM | POA: Diagnosis not present

## 2019-05-01 DIAGNOSIS — L814 Other melanin hyperpigmentation: Secondary | ICD-10-CM | POA: Diagnosis not present

## 2019-05-01 DIAGNOSIS — Z85828 Personal history of other malignant neoplasm of skin: Secondary | ICD-10-CM | POA: Diagnosis not present

## 2019-05-01 DIAGNOSIS — L570 Actinic keratosis: Secondary | ICD-10-CM | POA: Diagnosis not present

## 2019-05-22 ENCOUNTER — Other Ambulatory Visit: Payer: Self-pay | Admitting: Family

## 2019-05-22 DIAGNOSIS — I10 Essential (primary) hypertension: Secondary | ICD-10-CM

## 2019-05-23 DIAGNOSIS — M533 Sacrococcygeal disorders, not elsewhere classified: Secondary | ICD-10-CM | POA: Diagnosis not present

## 2019-06-14 DIAGNOSIS — M533 Sacrococcygeal disorders, not elsewhere classified: Secondary | ICD-10-CM | POA: Diagnosis not present

## 2019-06-27 ENCOUNTER — Other Ambulatory Visit: Payer: Self-pay

## 2019-06-28 ENCOUNTER — Ambulatory Visit (INDEPENDENT_AMBULATORY_CARE_PROVIDER_SITE_OTHER): Payer: Medicare Other

## 2019-06-28 DIAGNOSIS — Z23 Encounter for immunization: Secondary | ICD-10-CM

## 2019-07-11 ENCOUNTER — Other Ambulatory Visit: Payer: Self-pay | Admitting: Family

## 2019-07-11 DIAGNOSIS — I10 Essential (primary) hypertension: Secondary | ICD-10-CM

## 2019-08-08 ENCOUNTER — Other Ambulatory Visit: Payer: Self-pay | Admitting: Family

## 2019-08-08 DIAGNOSIS — I10 Essential (primary) hypertension: Secondary | ICD-10-CM

## 2019-08-09 NOTE — Telephone Encounter (Signed)
[place]. NTBS 30 days given 07/11/19

## 2019-08-09 NOTE — Telephone Encounter (Signed)
Patient aware- apt scheduled.

## 2019-08-11 ENCOUNTER — Other Ambulatory Visit: Payer: Self-pay

## 2019-08-14 ENCOUNTER — Encounter: Payer: Self-pay | Admitting: Family

## 2019-08-14 ENCOUNTER — Other Ambulatory Visit: Payer: Self-pay

## 2019-08-14 ENCOUNTER — Ambulatory Visit (INDEPENDENT_AMBULATORY_CARE_PROVIDER_SITE_OTHER): Payer: Medicare Other | Admitting: Family

## 2019-08-14 VITALS — BP 129/75 | HR 88 | Temp 97.8°F | Ht 66.5 in | Wt 217.0 lb

## 2019-08-14 DIAGNOSIS — F411 Generalized anxiety disorder: Secondary | ICD-10-CM

## 2019-08-14 DIAGNOSIS — M1710 Unilateral primary osteoarthritis, unspecified knee: Secondary | ICD-10-CM

## 2019-08-14 DIAGNOSIS — E785 Hyperlipidemia, unspecified: Secondary | ICD-10-CM | POA: Diagnosis not present

## 2019-08-14 DIAGNOSIS — M179 Osteoarthritis of knee, unspecified: Secondary | ICD-10-CM

## 2019-08-14 DIAGNOSIS — I10 Essential (primary) hypertension: Secondary | ICD-10-CM | POA: Diagnosis not present

## 2019-08-14 DIAGNOSIS — E559 Vitamin D deficiency, unspecified: Secondary | ICD-10-CM

## 2019-08-14 NOTE — Patient Instructions (Signed)
Health Maintenance After Age 82 After age 82, you are at a higher risk for certain long-term diseases and infections as well as injuries from falls. Falls are a major cause of broken bones and head injuries in people who are older than age 82. Getting regular preventive care can help to keep you healthy and well. Preventive care includes getting regular testing and making lifestyle changes as recommended by your health care provider. Talk with your health care provider about:  Which screenings and tests you should have. A screening is a test that checks for a disease when you have no symptoms.  A diet and exercise plan that is right for you. What should I know about screenings and tests to prevent falls? Screening and testing are the best ways to find a health problem early. Early diagnosis and treatment give you the best chance of managing medical conditions that are common after age 82. Certain conditions and lifestyle choices may make you more likely to have a fall. Your health care provider may recommend:  Regular vision checks. Poor vision and conditions such as cataracts can make you more likely to have a fall. If you wear glasses, make sure to get your prescription updated if your vision changes.  Medicine review. Work with your health care provider to regularly review all of the medicines you are taking, including over-the-counter medicines. Ask your health care provider about any side effects that may make you more likely to have a fall. Tell your health care provider if any medicines that you take make you feel dizzy or sleepy.  Osteoporosis screening. Osteoporosis is a condition that causes the bones to get weaker. This can make the bones weak and cause them to break more easily.  Blood pressure screening. Blood pressure changes and medicines to control blood pressure can make you feel dizzy.  Strength and balance checks. Your health care provider may recommend certain tests to check your  strength and balance while standing, walking, or changing positions.  Foot health exam. Foot pain and numbness, as well as not wearing proper footwear, can make you more likely to have a fall.  Depression screening. You may be more likely to have a fall if you have a fear of falling, feel emotionally low, or feel unable to do activities that you used to do.  Alcohol use screening. Using too much alcohol can affect your balance and may make you more likely to have a fall. What actions can I take to lower my risk of falls? General instructions  Talk with your health care provider about your risks for falling. Tell your health care provider if: ? You fall. Be sure to tell your health care provider about all falls, even ones that seem minor. ? You feel dizzy, sleepy, or off-balance.  Take over-the-counter and prescription medicines only as told by your health care provider. These include any supplements.  Eat a healthy diet and maintain a healthy weight. A healthy diet includes low-fat dairy products, low-fat (lean) meats, and fiber from whole grains, beans, and lots of fruits and vegetables. Home safety  Remove any tripping hazards, such as rugs, cords, and clutter.  Install safety equipment such as grab bars in bathrooms and safety rails on stairs.  Keep rooms and walkways well-lit. Activity   Follow a regular exercise program to stay fit. This will help you maintain your balance. Ask your health care provider what types of exercise are appropriate for you.  If you need a cane or   walker, use it as recommended by your health care provider.  Wear supportive shoes that have nonskid soles. Lifestyle  Do not drink alcohol if your health care provider tells you not to drink.  If you drink alcohol, limit how much you have: ? 0-1 drink a day for women. ? 0-2 drinks a day for men.  Be aware of how much alcohol is in your drink. In the [place] one drink equals one typical bottle of beer (12  oz), one-half glass of wine (5 oz), or one shot of hard liquor (1 oz).  Do not use any products that contain nicotine or tobacco, such as cigarettes and e-cigarettes. If you need help quitting, ask your health care provider. Summary  Having a healthy lifestyle and getting preventive care can help to protect your health and wellness after age 82.  Screening and testing are the best way to find a health problem early and help you avoid having a fall. Early diagnosis and treatment give you the best chance for managing medical conditions that are more common for people who are older than age 82.  Falls are a major cause of broken bones and head injuries in people who are older than age 82. Take precautions to prevent a fall at home.  Work with your health care provider to learn what changes you can make to improve your health and wellness and to prevent falls. This information is not intended to replace advice given to you by your health care provider. Make sure you discuss any questions you have with your health care provider. Document Revised: 11/03/2018 Document Reviewed: 05/26/2017 Elsevier Patient Education  2020 Elsevier Inc.  

## 2019-08-14 NOTE — Progress Notes (Signed)
Subjective:    Patient ID: Mary Moss, female    DOB: 1935-08-07, 84 y.o.   MRN: [id number]  Chief Complaint  Patient presents with  . Medical Management of Chronic Issues   Pt presents to the office today for chronic follow up. She is followed by [facility] for chronic back pain. She is followed by Endocrinologists every 6 months for prediabetes.   Hypertension This is a chronic problem. The current episode started more than 1 year ago. The problem has been resolved since onset. The problem is controlled. Associated symptoms include anxiety. Pertinent negatives include no malaise/fatigue, peripheral edema or shortness of breath. Risk factors for coronary artery disease include obesity and sedentary lifestyle. The current treatment provides moderate improvement. There is no history of kidney disease, CAD/MI, CVA or heart failure.  Arthritis Presents for follow-up visit. She complains of pain. The symptoms have been stable. Affected location: back. Her pain is at a severity of 2/10.  Hyperlipidemia This is a chronic problem. The current episode started more than 1 year ago. The problem is uncontrolled. Recent lipid tests were reviewed and are high. Exacerbating diseases include obesity. Pertinent negatives include no shortness of breath. Current antihyperlipidemic treatment includes diet change. The current treatment provides mild improvement of lipids. Risk factors for coronary artery disease include dyslipidemia, a sedentary lifestyle and post-menopausal.  Anxiety Presents for follow-up visit. Symptoms include depressed mood, excessive worry and nervous/anxious behavior. Patient reports no irritability, restlessness or shortness of breath. Symptoms occur occasionally.        Review of Systems  Constitutional: Negative for irritability and malaise/fatigue.  Respiratory: Negative for shortness of breath.   Musculoskeletal: Positive for arthritis.  Psychiatric/Behavioral: The  patient is nervous/anxious.   All other systems reviewed and are negative.      Objective:   Physical Exam Vitals reviewed.  Constitutional:      General: She is not in acute distress.    Appearance: She is well-developed.  HENT:     Head: Normocephalic and atraumatic.  Eyes:     Pupils: Pupils are equal, round, and reactive to light.  Neck:     Thyroid: No thyromegaly.  Cardiovascular:     Rate and Rhythm: Normal rate and regular rhythm.     Heart sounds: Normal heart sounds. No murmur.  Pulmonary:     Effort: Pulmonary effort is normal. No respiratory distress.     Breath sounds: Normal breath sounds. No wheezing.  Abdominal:     General: Bowel sounds are normal. There is no distension.     Palpations: Abdomen is soft.     Tenderness: There is no abdominal tenderness.  Musculoskeletal:        General: No tenderness. Normal range of motion.     Cervical back: Normal range of motion and neck supple.  Skin:    General: Skin is warm and dry.  Neurological:     Mental Status: She is alert and oriented to person, place, and time.     Cranial Nerves: No cranial nerve deficit.     Deep Tendon Reflexes: Reflexes are normal and symmetric.  Psychiatric:        Behavior: Behavior normal.        Thought Content: Thought content normal.        Judgment: Judgment normal.     BP 129/75   Pulse 88   Temp 97.8 F (36.6 C) (Temporal)   Ht 5' 6.5" (1.689 m)   Wt 217 lb (98.4 kg)  SpO2 95%   BMI 34.50 kg/m      Assessment & Plan:  [name] comes in today with chief complaint of Medical Management of Chronic Issues   Diagnosis and orders addressed:  1. Essential hypertension - CMP14+EGFR - CBC with Differential/Platelet  2. Osteoarthritis of knee, unspecified laterality, unspecified osteoarthritis type - CMP14+EGFR - CBC with Differential/Platelet  3. GAD (generalized anxiety disorder) - CMP14+EGFR - CBC with Differential/Platelet  4. Hyperlipidemia,  unspecified hyperlipidemia type - CMP14+EGFR - CBC with Differential/Platelet  5. Morbid obesity ([place]) - CMP14+EGFR - CBC with Differential/Platelet  6. Vitamin D deficiency - CMP14+EGFR - CBC with Differential/Platelet - VITAMIN D 25 Hydroxy (Vit-D Deficiency, Fractures)   Labs pending Health Maintenance reviewed Diet and exercise encouraged  Follow up plan: 6 months    Evelina Dun, FNP

## 2019-08-17 LAB — VITAMIN D 25 HYDROXY (VIT D DEFICIENCY, FRACTURES)

## 2019-08-17 LAB — CMP14+EGFR
ALT: 11 IU/L (ref 0–32)
AST: 17 IU/L (ref 0–40)
Albumin/Globulin Ratio: 1.1 — ABNORMAL LOW (ref 1.2–2.2)
Albumin: 3.8 g/dL (ref 3.6–4.6)
Alkaline Phosphatase: 115 IU/L (ref 39–117)
BUN/Creatinine Ratio: 19 (ref 12–28)
BUN: 20 mg/dL (ref 8–27)
Bilirubin Total: 0.5 mg/dL (ref 0.0–1.2)
CO2: 21 mmol/L (ref 20–29)
Calcium: 10 mg/dL (ref 8.7–10.3)
Chloride: 103 mmol/L (ref 96–106)
Creatinine, Ser: 1.08 mg/dL — ABNORMAL HIGH (ref 0.57–1.00)
GFR calc Af Amer: 56 mL/min/{1.73_m2} — ABNORMAL LOW (ref >59)
GFR calc non Af Amer: 48 mL/min/{1.73_m2} — ABNORMAL LOW (ref >59)
Globulin, Total: 3.5 g/dL (ref 1.5–4.5)
Glucose: 101 mg/dL — ABNORMAL HIGH (ref 65–99)
Potassium: 4.9 mmol/L (ref 3.5–5.2)
Sodium: 142 mmol/L (ref 134–144)
Total Protein: 7.3 g/dL (ref 6.0–8.5)

## 2019-08-17 LAB — CBC WITH DIFFERENTIAL/PLATELET
Basophils Absolute: 0.1 10*3/uL (ref 0.0–0.2)
Basos: 1 %
EOS (ABSOLUTE): 0.3 10*3/uL (ref 0.0–0.4)
Eos: 4 %
Hematocrit: 45 % (ref 34.0–46.6)
Hemoglobin: 15.2 g/dL (ref 11.1–15.9)
Immature Grans (Abs): 0 10*3/uL (ref 0.0–0.1)
Immature Granulocytes: 0 %
Lymphocytes Absolute: 2.8 10*3/uL (ref 0.7–3.1)
Lymphs: 31 %
MCH: 31.3 pg (ref 26.6–33.0)
MCHC: 33.8 g/dL (ref 31.5–35.7)
MCV: 93 fL (ref 79–97)
Monocytes Absolute: 0.6 10*3/uL (ref 0.1–0.9)
Monocytes: 7 %
Neutrophils Absolute: 5.1 10*3/uL (ref 1.4–7.0)
Neutrophils: 57 %
Platelets: 286 10*3/uL (ref 150–450)
RBC: 4.86 x10E6/uL (ref 3.77–5.28)
RDW: 11.6 % — ABNORMAL LOW (ref 11.7–15.4)
WBC: 8.9 10*3/uL (ref 3.4–10.8)

## 2019-08-24 ENCOUNTER — Other Ambulatory Visit: Payer: Self-pay | Admitting: Family

## 2019-08-24 ENCOUNTER — Encounter: Payer: Self-pay | Admitting: Family

## 2019-08-24 ENCOUNTER — Ambulatory Visit (INDEPENDENT_AMBULATORY_CARE_PROVIDER_SITE_OTHER): Payer: Medicare Other | Admitting: Family

## 2019-08-24 DIAGNOSIS — M8580 Other specified disorders of bone density and structure, unspecified site: Secondary | ICD-10-CM

## 2019-08-24 DIAGNOSIS — Z780 Asymptomatic menopausal state: Secondary | ICD-10-CM

## 2019-08-24 DIAGNOSIS — M25511 Pain in right shoulder: Secondary | ICD-10-CM | POA: Diagnosis not present

## 2019-08-24 DIAGNOSIS — I10 Essential (primary) hypertension: Secondary | ICD-10-CM

## 2019-08-24 MED ORDER — ETODOLAC 300 MG PO CAPS: 300.0000 mg | ORAL_CAPSULE | Freq: Two times a day (BID) | ORAL | 1 refills | Status: DC | PRN

## 2019-08-24 NOTE — Progress Notes (Signed)
Virtual Visit via telephone Note Due to COVID-19 pandemic this visit was conducted virtually. This visit type was conducted due to national recommendations for restrictions regarding the COVID-19 Pandemic (e.g. social distancing, sheltering in place) in an effort to limit this patient's exposure and mitigate transmission in our community. All issues noted in this document were discussed and addressed.  A physical exam was not performed with this format.  I connected with Mary Moss on 08/24/19 at 9:19 AM  by telephone and verified that I am speaking with the correct person using two identifiers. Mary Moss is currently located at home  and no one is currently with her during visit. The provider, Evelina Dun, FNP is located in their office at time of visit.  I discussed the limitations, risks, security and privacy concerns of performing an evaluation and management service by telephone and the availability of in person appointments. I also discussed with the patient that there may be a patient responsible charge related to this service. The patient expressed understanding and agreed to proceed.   History and Present Illness:  Pt calls the office today with recurrent right shoulder pain that started over the last few days. She states the last time this "flared up was in 2019" and was diagnosed with bursitis. She has taken  Etodolac in the past and states it "clears it up in 2-3 days".   She also reports it has 2 years since her last dexa scan.  Shoulder Pain  The pain is present in the right shoulder. This is a recurrent problem. The current episode started in the past 7 days. There has been no history of extremity trauma. The problem occurs constantly. The problem has been gradually worsening. The pain is at a severity of 8/10. The pain is moderate. Associated symptoms include a limited range of motion and stiffness. Pertinent negatives include no inability to bear weight, itching or  numbness. She has tried rest for the symptoms. The treatment provided mild relief.      Review of Systems  Musculoskeletal: Positive for joint pain and stiffness.  Skin: Negative for itching.  Neurological: Negative for numbness.  All other systems reviewed and are negative.    Observations/Objective: NO SOB or distress noted   Assessment and Plan: 1. Acute pain of right shoulder No other NSAID"s while taking Etodolac  Rest ROM exercises encouraged Call if symptoms worsen or do not improve  - etodolac (LODINE) 300 MG capsule; Take 1 capsule (300 mg total) by mouth 2 (two) times daily as needed.  Dispense: 30 capsule; Refill: 1  2. Low bone mass - DG [facility] DEXA  3. Osteopenia, unspecified location - DG [facility] DEXA  4. Post-menopause - DG [facility] DEXA      I discussed the assessment and treatment plan with the patient. The patient was provided an opportunity to ask questions and all were answered. The patient agreed with the plan and demonstrated an understanding of the instructions.   The patient was advised to call back or seek an in-person evaluation if the symptoms worsen or if the condition fails to improve as anticipated.  The above assessment and management plan was discussed with the patient. The patient verbalized understanding of and has agreed to the management plan. Patient is aware to call the clinic if symptoms persist or worsen. Patient is aware when to return to the clinic for a follow-up visit. Patient educated on when it is appropriate to go to the emergency department.  Time call ended:  9:32 AM  I provided 13 minutes of non-face-to-face time during this encounter.    Evelina Dun, FNP

## 2019-09-01 ENCOUNTER — Other Ambulatory Visit: Payer: Self-pay

## 2019-09-01 ENCOUNTER — Ambulatory Visit (INDEPENDENT_AMBULATORY_CARE_PROVIDER_SITE_OTHER): Payer: Medicare Other

## 2019-09-01 DIAGNOSIS — M8588 Other specified disorders of bone density and structure, other site: Secondary | ICD-10-CM | POA: Diagnosis not present

## 2019-09-01 DIAGNOSIS — Z780 Asymptomatic menopausal state: Secondary | ICD-10-CM | POA: Diagnosis not present

## 2019-09-13 ENCOUNTER — Other Ambulatory Visit: Payer: Self-pay

## 2019-09-13 ENCOUNTER — Ambulatory Visit (INDEPENDENT_AMBULATORY_CARE_PROVIDER_SITE_OTHER): Payer: Medicare Other | Admitting: *Deleted

## 2019-09-13 DIAGNOSIS — Z0000 Encounter for general adult medical examination without abnormal findings: Secondary | ICD-10-CM

## 2019-09-13 NOTE — Progress Notes (Signed)
MEDICARE ANNUAL WELLNESS VISIT  09/13/2019  Telephone Visit Disclaimer This Medicare AWV was conducted by telephone due to national recommendations for restrictions regarding the COVID-19 Pandemic (e.g. social distancing).  I verified, using two identifiers, that I am speaking with Mary Moss or their authorized healthcare agent. I discussed the limitations, risks, security, and privacy concerns of performing an evaluation and management service by telephone and the potential availability of an in-person appointment in the future. The patient expressed understanding and agreed to proceed.   Subjective:  Mary Moss is a 84 y.o. female patient of Hawks, Theador Hawthorne, FNP who had a Medicare Annual Wellness Visit today via telephone. Mary Moss is Retired and lives alone. she has 2 stepchildren. she reports that she is socially active and does interact with friends/family regularly. she is minimally physically active and enjoys doing crossword puzzles, traveling and socializing with friends.  Patient Care Team: Sharion Balloon, FNP as PCP - General (Family Medicine)  Advanced Directives 09/13/2019 10/19/2017 06/16/2016 06/08/2016 06/01/2016 08/05/2015 02/26/2015  Does Patient Have a Medical Advance Directive? Yes Yes Yes Yes Yes Yes No  Type of Paramedic of [place];Living will Living will;Healthcare Power of [place];Living will [place];Living will [place];Living will -  Does patient want to make changes to medical advance directive? No - Patient declined No - Patient declined - No - Patient declined - - -  Copy of [place] in Chart? No - copy requested - - No - copy requested No - copy requested No - copy requested -  Would patient like information on creating a medical advance directive? - No - Patient declined - - - - -  Pre-existing out of facility DNR order (yellow form  or pink MOST form) - - - - - - -    Hospital Utilization Over the Past 12 Months: # of hospitalizations or ER visits: 0 # of surgeries: 0  Review of Systems    Patient reports that her overall health is worse due to the joint pain and right shoulder pain compared to last year.  History obtained from chart review  Patient Reported Readings (BP, Pulse, CBG, Weight, etc) none  Pain Assessment Pain : 0-10(right shoulder pain) Pain Score: 4  Pain Type: Acute pain Pain Location: Shoulder Pain Orientation: Right Pain Descriptors / Indicators: Aching, Heaviness, Guarding Pain Onset: 1 to 4 weeks ago Pain Frequency: Intermittent Pain Relieving Factors: anti-inflammatory medication Effect of Pain on Daily Activities: not able to lift her right arm  Pain Relieving Factors: anti-inflammatory medication  Current Medications & Allergies (verified) Allergies as of 09/13/2019      Reactions   Statins    Simvastatin, crestor, livalo Not able to walk because so much muscle/joint pain   Neosporin [neomycin-bacitracin Zn-polymyx] Rash   Polysporin [bacitracin-polymyxin B] Rash      Medication List       Accurate as of September 13, 2019 11:22 AM. If you have any questions, ask your nurse or doctor.        CALCIUM 1200 PO Take by mouth.   Cholecalciferol 1.25 MG (50000 UT) Tabs cholecalciferol (vitamin D3) 1,250 mcg (50,000 unit) capsule  TAKE 1 CAPSULE BY MOUTH WEEKLY   clobetasol cream 0.05 % Commonly known as: TEMOVATE APPLY TOPICALLY ONCE DAILY FOR 7 DAYS THEN APPLY EVERY OTHER DAY   etodolac 300 MG capsule Commonly known as: LODINE Take 1 capsule (300  mg total) by mouth 2 (two) times daily as needed.   hydrochlorothiazide 25 MG tablet Commonly known as: HYDRODIURIL Take 1 tablet (25 mg total) by mouth daily.   lisinopril 10 MG tablet Commonly known as: ZESTRIL TAKE 1 TABLET (10 MG TOTAL) BY MOUTH DAILY. (PLEASE MAKE APPT FOR REGULAR MED [facility])       History  (reviewed): Past Medical History:  Diagnosis Date  . Ankle fracture fx 13 yrs ago, anklw swells off and on, has cramps in ankle also   left, to seee dr hewitt about 02-16-14  . Arthritis   . Basal cell carcinoma 2015   Nose  . GERD (gastroesophageal reflux disease)    OTC med  . Hypertension   . Pneumonia    Past Surgical History:  Procedure Laterality Date  . ABDOMINAL HYSTERECTOMY    . EYE SURGERY Bilateral    bilaterla cataract extraction with IOL  . FRACTURE SURGERY Left 2001  . JOINT REPLACEMENT    . KNEE ARTHROSCOPY Right 02/07/2014   Procedure: RIGHT ARTHROSCOPY KNEE, WITH MEDIAL MENISCAL DEBRIDEMENT AND CHONDRAPLASTY;  Surgeon: Gearlean Alf, MD;  Location: [facility];  Service: Orthopedics;  Laterality: Right;  . ROTATOR CUFF REPAIR Right 2009  . TOTAL ANKLE ARTHROPLASTY Left 12/20/2014   Procedure: LEFT TOTAL ANKLE ARTHOPLASTY WITH PERCUTANEOUS ACHILLES TENDON LENGTHENING;  Surgeon: Wylene Simmer, MD;  Location: [facility];  Service: Orthopedics;  Laterality: Left;  . TOTAL KNEE ARTHROPLASTY Right 06/08/2016   Procedure: RIGHT TOTAL KNEE ARTHROPLASTY;  Surgeon: Gaynelle Arabian, MD;  Location: [facility];  Service: Orthopedics;  Laterality: Right;   Family History  Problem Relation Age of Onset  . Cancer Mother        colon  . Cancer Father        lung  . Cancer Sister        lung cancer-nonsmoker  . Alzheimer's disease Brother 26   Social History   Socioeconomic History  . Marital status: Widowed    Spouse name: Not on file  . Number of children: 0  . Years of education: 16  . Highest education level: Some college, no degree  Occupational History  . Occupation: Management consultant    Comment: retired  . Occupation: Warden/ranger    Comment: Retired  Tobacco Use  . Smoking status: Former Smoker    Packs/day: 0.50    Years: 40.00    Pack years: 20.00    Types: Cigarettes    Start date: 07/27/1957    Quit date: 12/15/2007    Years since quitting: 11.7  .  Smokeless tobacco: Never Used  Substance and Sexual Activity  . Alcohol use: No  . Drug use: No  . Sexual activity: Not Currently    Birth control/protection: Surgical  Other Topics Concern  . Not on file  Social History Narrative  . Not on file   Social Determinants of Health   Financial Resource Strain: Low Risk   . Difficulty of Paying Living Expenses: Not hard at all  Food Insecurity: No Food Insecurity  . Worried About Charity fundraiser in the Last Year: Never true  . Ran Out of Food in the Last Year: Never true  Transportation Needs: No Transportation Needs  . Lack of Transportation (Medical): No  . Lack of Transportation (Non-Medical): No  Physical Activity: Inactive  . Days of Exercise per Week: 0 days  . Minutes of Exercise per Session: 0 min  Stress: No Stress Concern Present  . Feeling  of Stress : Not at all  Social Connections: Slightly Isolated  . Frequency of Communication with Friends and Family: More than three times a week  . Frequency of Social Gatherings with Friends and Family: More than three times a week  . Attends Religious Services: More than 4 times per year  . Active Member of Clubs or Organizations: Yes  . Attends Archivist Meetings: More than 4 times per year  . Marital Status: Widowed    Activities of Daily Living In your present state of health, do you have any difficulty performing the following activities: 09/13/2019  Hearing? Y  Comment hearing loss in right ear-in the process of scheduling evaluation for hearing aids  Vision? N  Comment wears glasses-gets yearly eye exam  Difficulty concentrating or making decisions? N  Walking or climbing stairs? N  Dressing or bathing? N  Comment has a shower chair and had a walk in shower installed  Doing errands, shopping? N  Preparing Food and eating ? N  Using the Toilet? N  In the past six months, have you accidently leaked urine? Y  Comment wears pads  Do you have problems with  loss of bowel control? N  Managing your Medications? N  Managing your Finances? N  Housekeeping or managing your Housekeeping? Y  Comment most of the day to day chores she can do-she does have someone come in to do deep cleanings  Some recent data might be hidden    Patient Education/ Literacy How often do you need to have someone help you when you read instructions, pamphlets, or other written materials from your doctor or pharmacy?: 1 - Never What is the last grade level you completed in school?: Some college-no degree  Exercise Current Exercise Habits: The patient does not participate in regular exercise at present, Exercise limited by: orthopedic condition(s)  Diet Patient reports consuming 2 meals a day and 1 snack(s) a day Patient reports that her primary diet is: Regular Patient reports that she does have regular access to food.   Depression Screen PHQ 2/9 Scores 09/13/2019 08/14/2019 08/14/2019 11/18/2018 02/08/2018 11/10/2017 10/19/2017  PHQ - 2 Score 0 0 0 0 0 3 0  PHQ- 9 Score - - - - - 8 -     Fall Risk Fall Risk  09/13/2019 08/14/2019 11/18/2018 02/08/2018 11/10/2017  Falls in the past year? 0 0 0 No No  Risk for fall due to : - (No Data) - - -  Risk for fall due to: Comment - age - - -  Follow up - Falls prevention discussed - - -     Objective:  Mary Moss seemed alert and oriented and she participated appropriately during our telephone visit.  Blood Pressure Weight BMI  BP Readings from Last 3 Encounters:  08/14/19 129/75  11/18/18 124/74  02/08/18 (!) 150/85   Wt Readings from Last 3 Encounters:  08/14/19 217 lb (98.4 kg)  11/18/18 222 lb 9.6 oz (101 kg)  02/08/18 224 lb (101.6 kg)   BMI Readings from Last 1 Encounters:  08/14/19 34.50 kg/m    *Unable to obtain current vital signs, weight, and BMI due to telephone visit type  Hearing/Vision  . Decia did not seem to have difficulty with hearing/understanding during the telephone  conversation . Reports that she has had a formal eye exam by an eye care professional within the past year . Reports that she has not had a formal hearing evaluation within the past year *Unable  to fully assess hearing and vision during telephone visit type  Cognitive Function: 6CIT Screen 09/13/2019  What Year? 0 points  What month? 0 points  What time? 0 points  Count back from 20 0 points  Months in reverse 0 points  Repeat phrase 0 points  Total Score 0   (Normal:0-7, Significant for Dysfunction: >8)  Normal Cognitive Function Screening: Yes   Immunization & Health Maintenance Record Immunization History  Administered Date(s) Administered  . Fluad Quad(high Dose 65+) 06/28/2019  . Influenza, High Dose Seasonal PF 05/19/2016, 05/23/2018  . Influenza,inj,Quad PF,6+ Mos 05/31/2013, 08/11/2014, 06/13/2015  . Influenza,inj,quad, With Preservative 04/06/2017  . Influenza-Unspecified 04/06/2017  . Pneumococcal Conjugate-13 06/13/2015  . Pneumococcal Polysaccharide-23 10/19/2017  . Pneumococcal-Unspecified 07/27/2017    Health Maintenance  Topic Date Due  . TETANUS/TDAP  06/19/1955  . MAMMOGRAM  02/27/2020  . INFLUENZA VACCINE  Completed  . DEXA SCAN  Completed  . PNA vac Low Risk Adult  Completed       Assessment  This is a routine wellness examination for Mary Moss.  Health Maintenance: Due or Overdue Health Maintenance Due  Topic Date Due  . Samul Dada  06/19/1955    Mary Moss does not need a referral for Community Assistance: Care Management:   no Social Work:    no Prescription Assistance:  no Nutrition/Diabetes Education:  no   Plan:  Personalized Goals Goals Addressed            This Visit's Progress   . DIET - INCREASE WATER INTAKE       Try to drink 6-8 glasses of water daily      Personalized Health Maintenance & Screening Recommendations  Td vaccine Advanced directives: has an advanced directive - a copy HAS NOT been  provided. Shingles vaccine  Lung Cancer Screening Recommended: yes-declines at this time (Low Dose CT Chest recommended if Age 60-80 years, 30 pack-year currently smoking OR have quit w/in past 15 years) Hepatitis C Screening recommended: no HIV Screening recommended: no  Advanced Directives: Written information was not prepared per patient's request.  Referrals & Orders No orders of the defined types were placed in this encounter.   Follow-up Plan . Follow-up with Sharion Balloon, FNP as planned . Schedule your hearing evaluation as discussed . Consider TDAP and Shingles vaccines at your next visit with your PCP . Bring a copy of your Advanced Directives in for our records   I have personally reviewed and noted the following in the patient's chart:   . Medical and social history . Use of alcohol, tobacco or illicit drugs  . Current medications and supplements . Functional ability and status . Nutritional status . Physical activity . Advanced directives . List of other physicians . Hospitalizations, surgeries, and ER visits in previous 12 months . Vitals . Screenings to include cognitive, depression, and falls . Referrals and appointments  In addition, I have reviewed and discussed with Mary Moss certain preventive protocols, quality metrics, and best practice recommendations. A written personalized care plan for preventive services as well as general preventive health recommendations is available and can be mailed to the patient at her request.      Milas Hock, LPN  [id number]

## 2019-09-13 NOTE — Patient Instructions (Signed)
Preventive Care 82 Years and Older, Female Preventive care refers to lifestyle choices and visits with your health care provider that can promote health and wellness. This includes:  A yearly physical exam. This is also called an annual well check.  Regular dental and eye exams.  Immunizations.  Screening for certain conditions.  Healthy lifestyle choices, such as diet and exercise. What can I expect for my preventive care visit? Physical exam Your health care provider will check:  Height and weight. These may be used to calculate body mass index (BMI), which is a measurement that tells if you are at a healthy weight.  Heart rate and blood pressure.  Your skin for abnormal spots. Counseling Your health care provider may ask you questions about:  Alcohol, tobacco, and drug use.  Emotional well-being.  Home and relationship well-being.  Sexual activity.  Eating habits.  History of falls.  Memory and ability to understand (cognition).  Work and work Statistician.  Pregnancy and menstrual history. What immunizations do I need?  Influenza (flu) vaccine  This is recommended every year. Tetanus, diphtheria, and pertussis (Tdap) vaccine  You may need a Td booster every 10 years. Varicella (chickenpox) vaccine  You may need this vaccine if you have not already been vaccinated. Zoster (shingles) vaccine  You may need this after age 82. Pneumococcal conjugate (PCV13) vaccine  One dose is recommended after age 82. Pneumococcal polysaccharide (PPSV23) vaccine  One dose is recommended after age 72. Measles, mumps, and rubella (MMR) vaccine  You may need at least one dose of MMR if you were born in 1957 or later. You may also need a second dose. Meningococcal conjugate (MenACWY) vaccine  You may need this if you have certain conditions. Hepatitis A vaccine  You may need this if you have certain conditions or if you travel or work in places where you may be exposed  to hepatitis A. Hepatitis B vaccine  You may need this if you have certain conditions or if you travel or work in places where you may be exposed to hepatitis B. Haemophilus influenzae type b (Hib) vaccine  You may need this if you have certain conditions. You may receive vaccines as individual doses or as more than one vaccine together in one shot (combination vaccines). Talk with your health care provider about the risks and benefits of combination vaccines. What tests do I need? Blood tests  Lipid and cholesterol levels. These may be checked every 5 years, or more frequently depending on your overall health.  Hepatitis C test.  Hepatitis B test. Screening  Lung cancer screening. You may have this screening every year starting at age 39 if you have a 30-pack-year history of smoking and currently smoke or have quit within the past 15 years.  Colorectal cancer screening. All adults should have this screening starting at age 36 and continuing until age 15. Your health care provider may recommend screening at age 23 if you are at increased risk. You will have tests every 1-10 years, depending on your results and the type of screening test.  Diabetes screening. This is done by checking your blood sugar (glucose) after you have not eaten for a while (fasting). You may have this done every 1-3 years.  Mammogram. This may be done every 1-2 years. Talk with your health care provider about how often you should have regular mammograms.  BRCA-related cancer screening. This may be done if you have a family history of breast, ovarian, tubal, or peritoneal cancers.  Other tests  Sexually transmitted disease (STD) testing.  Bone density scan. This is done to screen for osteoporosis. You may have this done starting at age 62. Follow these instructions at home: Eating and drinking  Eat a diet that includes fresh fruits and vegetables, whole grains, lean protein, and low-fat dairy products. Limit  your intake of foods with high amounts of sugar, saturated fats, and salt.  Take vitamin and mineral supplements as recommended by your health care provider.  Do not drink alcohol if your health care provider tells you not to drink.  If you drink alcohol: ? Limit how much you have to 0-1 drink a day. ? Be aware of how much alcohol is in your drink. In the [place] one drink equals one 12 oz bottle of beer (355 mL), one 5 oz glass of wine (148 mL), or one 1 oz glass of hard liquor (44 mL). Lifestyle  Take daily care of your teeth and gums.  Stay active. Exercise for at least 30 minutes on 5 or more days each week.  Do not use any products that contain nicotine or tobacco, such as cigarettes, e-cigarettes, and chewing tobacco. If you need help quitting, ask your health care provider.  If you are sexually active, practice safe sex. Use a condom or other form of protection in order to prevent STIs (sexually transmitted infections).  Talk with your health care provider about taking a low-dose aspirin or statin. What's next?  Go to your health care provider once a year for a well check visit.  Ask your health care provider how often you should have your eyes and teeth checked.  Stay up to date on all vaccines. This information is not intended to replace advice given to you by your health care provider. Make sure you discuss any questions you have with your health care provider. Document Revised: 07/07/2018 Document Reviewed: 07/07/2018 Elsevier Patient Education  2020 [employer].

## 2019-10-10 ENCOUNTER — Ambulatory Visit: Payer: Medicare Other | Attending: Surgical | Admitting: Physical Therapy

## 2019-10-10 ENCOUNTER — Other Ambulatory Visit: Payer: Self-pay

## 2019-10-10 ENCOUNTER — Encounter: Payer: Self-pay | Admitting: Physical Therapy

## 2019-10-10 DIAGNOSIS — M6281 Muscle weakness (generalized): Secondary | ICD-10-CM | POA: Insufficient documentation

## 2019-10-10 DIAGNOSIS — M25511 Pain in right shoulder: Secondary | ICD-10-CM | POA: Diagnosis present

## 2019-10-10 DIAGNOSIS — R293 Abnormal posture: Secondary | ICD-10-CM | POA: Insufficient documentation

## 2019-10-10 DIAGNOSIS — G8929 Other chronic pain: Secondary | ICD-10-CM | POA: Insufficient documentation

## 2019-10-10 NOTE — Therapy (Signed)
[facility] [place], [place], [address] Phone: [phone]   Fax:  [phone]  Physical Therapy Evaluation  Patient Details  Name: Mary Moss MRN: [id number] Date of Birth: 07-17-1936 Referring Provider (PT): Grier Mitts, [place]   Encounter Date: 10/10/2019  PT End of Session - 10/10/19 0924    Visit Number  1    Number of Visits  8    Date for PT Re-Evaluation  11/14/19    Authorization Type  Progress note every 10th visit, KX modifier at 15th visit    PT Start Time  0816    PT Stop Time  0900    PT Time Calculation (min)  44 min    Activity Tolerance  Patient tolerated treatment well    Behavior During Therapy  [facility] for tasks assessed/performed       Past Medical History:  Diagnosis Date  . Ankle fracture fx 13 yrs ago, anklw swells off and on, has cramps in ankle also   left, to seee dr hewitt about 02-16-14  . Arthritis   . Basal cell carcinoma 2015   Nose  . GERD (gastroesophageal reflux disease)    OTC med  . Hypertension   . Pneumonia     Past Surgical History:  Procedure Laterality Date  . ABDOMINAL HYSTERECTOMY    . EYE SURGERY Bilateral    bilaterla cataract extraction with IOL  . FRACTURE SURGERY Left 2001  . JOINT REPLACEMENT    . KNEE ARTHROSCOPY Right 02/07/2014   Procedure: RIGHT ARTHROSCOPY KNEE, WITH MEDIAL MENISCAL DEBRIDEMENT AND CHONDRAPLASTY;  Surgeon: Gearlean Alf, MD;  Location: [facility];  Service: Orthopedics;  Laterality: Right;  . ROTATOR CUFF REPAIR Right 2009  . TOTAL ANKLE ARTHROPLASTY Left 12/20/2014   Procedure: LEFT TOTAL ANKLE ARTHOPLASTY WITH PERCUTANEOUS ACHILLES TENDON LENGTHENING;  Surgeon: Wylene Simmer, MD;  Location: [place];  Service: Orthopedics;  Laterality: Left;  . TOTAL KNEE ARTHROPLASTY Right 06/08/2016   Procedure: RIGHT TOTAL KNEE ARTHROPLASTY;  Surgeon: Gaynelle Arabian, MD;  [facility];  Service: Orthopedics;  Laterality: Right;    There were no  vitals filed for this visit.   Subjective Assessment - 10/10/19 0916    Subjective  COVID-19 screening performed upon arrival.Patient arrives to physical therapy with reports of right shoulder pain that began insidiously about 6 months ago. Patient reports right shoulder pain and a loss of range of motion. Patient states she has been using compensatory movements and has been modifying her ADLs to allow her to perform independently. Patient reports pain increases with lifting and pain at worst is 6/10. Pain at best is rated at 2/10 with rest and with Tylenol and Advil as needed. Patient's goals are to decrease pain, improve movement and improve strength.    Pertinent History  right massive rotator cuff tear (irreparable), knee replacement, ankle replacement, HTN    Limitations  Lifting;House hold activities    Diagnostic tests  MRI: massive tear of right rotator cuff    Patient Stated Goals  decrease pain, impove movement    Currently in Pain?  Yes    Pain Score  2     Pain Location  Shoulder    Pain Orientation  Right    Pain Descriptors / Indicators  Shooting;Sharp    Pain Type  Chronic pain    Pain Onset  More than a month ago    Pain Frequency  Constant    Aggravating Factors   trying to raise  upward    Pain Relieving Factors  not moving, tylenol and advil as needed, tylenol PM every evening    Effect of Pain on Daily Activities  difficulties with daily activities and home tasks         [facility] PT Assessment - 10/10/19 0001      Assessment   Medical Diagnosis  right shoulder massive RCT (treating conservatively)    Referring Provider (PT)  Grier Mitts, PA-C    Onset Date/Surgical Date  --   July 2020   Hand Dominance  Right    Next MD Visit  11/01/2019    Prior Therapy  no      Precautions   Precautions  Other (comment)    Precaution Comments  massive RTC tear, treat conservatively per referral      Balance Screen   Has the patient fallen in the past 6 months  No    Has  the patient had a decrease in activity level because of a fear of falling?   Yes    Is the patient reluctant to leave their home because of a fear of falling?   No      Home Environment   Living Environment  Private residence      Prior Function   Level of Independence  Independent with basic ADLs      Posture/Postural Control   Posture/Postural Control  Postural limitations    Postural Limitations  Rounded Shoulders;Forward head    Posture Comments  right shoulder held in guarded position      ROM / Strength   AROM / PROM / Strength  PROM      PROM   PROM Assessment Site  Shoulder    Right/Left Shoulder  Right    Right Shoulder Flexion  70 Degrees    Right Shoulder ABduction  52 Degrees    Right Shoulder Internal Rotation  --   to abdomen   Right Shoulder External Rotation  28 Degrees      Palpation   Palpation comment  tender to right bicipital groove, right biceps, and right posterior cuff musculature upon palpation      Transfers   Comments  slow transitional movements with use of left UE to rise from supine to sit                Objective measurements completed on examination: See above findings.      [facility] Adult PT Treatment/Exercise - 10/10/19 0001      Modalities   Modalities  Electrical Stimulation;Vasopneumatic      Electrical Stimulation   Electrical Stimulation Location  Right shoulder    Electrical Stimulation Action  IFC    Electrical Stimulation Parameters  80-150 hz x10 mins    Electrical Stimulation Goals  Pain      Vasopneumatic   Number Minutes Vasopneumatic   10 minutes    Vasopnuematic Location   Shoulder    Vasopneumatic Pressure  Low    Vasopneumatic Temperature   40             PT Education - 10/10/19 0923    Education Details  isometric internal and external rotation, AAROM ER, scapular retraction    Person(s) Educated  Patient    Methods  Explanation;Demonstration;Handout    Comprehension  Verbalized understanding           PT Long Term Goals - 10/10/19 0924      PT LONG TERM GOAL #1   Title  Patient  will be independent with HEP.    Time  4    Period  Weeks    Status  New      PT LONG TERM GOAL #2   Title  Patient will report ability to perform ADLs with right shoulder pain less than or equal to 4/10.    Time  4    Period  Weeks    Status  New      PT LONG TERM GOAL #3   Title  Patient will demonstrate 40+ degrees of right shoulder ER AROM to improve ability to don/doff apparel.    Time  4    Period  Weeks    Status  New      PT LONG TERM GOAL #4   Title  Patient will demonstrate 90+ degrees of right shoulder flexion AROM to improve ability to perform shoulder height activities.    Time  4    Period  Weeks    Status  New      PT LONG TERM GOAL #5   Title  --             Plan - 10/10/19 0945    Clinical Impression Statement  Patient is an 84 year old right handed female who presents to physical therapy with right shoulder pain, loss of right shoulder ROM, and limited right shoulder MMT. Patient noted with pain at the end of her available range. Patient noted sitting with her right shoulder in a guarded position with rounded shoulders and a forward head. Patient tender to palpation to right bicipital groove, right biceps, and right posterior cuff musculature. Patient and PT discussed plan of care of conservative treatments as well as HEP to which she reported understanding and agreement. Patient would benefit from skilled physical therapy to address deficits and address patient's goals.    Personal Factors and Comorbidities  Age;Comorbidity 2    Comorbidities  right massive rotator cuff tear (irreparable), knee replacement, ankle replacement, HTN    Examination-Activity Limitations  Dressing;Hygiene/Grooming;Reach Overhead    Examination-Participation Restrictions  Cleaning    Stability/Clinical Decision Making  Stable/Uncomplicated    Clinical Decision Making  Low    Rehab  Potential  Fair    PT Frequency  2x / week    PT Duration  4 weeks    PT Treatment/Interventions  ADLs/Self Care Home Management;Cryotherapy;Electrical Stimulation;Iontophoresis 4mg /ml Dexamethasone;Moist Heat;Ultrasound;Therapeutic exercise;Neuromuscular re-education;Manual techniques;Passive range of motion;Therapeutic activities;Patient/family education;Vasopneumatic Device    PT Next Visit Plan  conservative treatment to right shoulder per referral, PROM, Ultrasound, modalities PRN for pain relief    PT Home Exercise Plan  see patient education section    Consulted and Agree with Plan of Care  Patient       Patient will benefit from skilled therapeutic intervention in order to improve the following deficits and impairments:  Decreased activity tolerance, Decreased strength, Decreased range of motion, Pain, Postural dysfunction, Impaired UE functional use  Visit Diagnosis: Chronic right shoulder pain - Plan: PT plan of care cert/re-cert  Muscle weakness (generalized) - Plan: PT plan of care cert/re-cert     Problem List Patient Active Problem List   Diagnosis Date Noted  . Morbid obesity ([place]) 11/18/2018  . OA (osteoarthritis) of knee 06/08/2016  . Arthritis of knee, right 01/31/2016  . Hyperlipidemia 09/02/2015  . Pre-diabetes 07/12/2015  . Essential hypertension 07/03/2015  . Post-traumatic arthritis of ankle 12/20/2014  . Acute medial meniscal tear 02/06/2014  . Low bone mass 12/14/2012  .  GAD (generalized anxiety disorder) 12/14/2012  . Vitamin D deficiency 12/14/2012    Gabriela Eves, PT, DPT 10/10/2019, 10:00 AM  [facility] [address] [place], [place], [address] Phone: [phone]   Fax:  [phone]  Name: Mary Moss MRN: [id number] Date of Birth: 04/24/1936

## 2019-10-12 ENCOUNTER — Ambulatory Visit: Payer: Medicare Other | Admitting: Physical Therapy

## 2019-10-12 ENCOUNTER — Other Ambulatory Visit: Payer: Self-pay

## 2019-10-12 ENCOUNTER — Encounter: Payer: Self-pay | Admitting: Physical Therapy

## 2019-10-12 DIAGNOSIS — M25511 Pain in right shoulder: Secondary | ICD-10-CM | POA: Diagnosis not present

## 2019-10-12 DIAGNOSIS — G8929 Other chronic pain: Secondary | ICD-10-CM

## 2019-10-12 DIAGNOSIS — M6281 Muscle weakness (generalized): Secondary | ICD-10-CM

## 2019-10-12 DIAGNOSIS — R293 Abnormal posture: Secondary | ICD-10-CM

## 2019-10-12 NOTE — Therapy (Signed)
[facility] [place], [place], [address] Phone: [phone]   Fax:  [phone]  Physical Therapy Treatment  Patient Details  Name: Mary Moss MRN: [id number] Date of Birth: March 15, 1936 Referring Provider (PT): Grier Mitts, [place]   Encounter Date: 10/12/2019  PT End of Session - 10/12/19 0845    Visit Number  2    Number of Visits  8    Date for PT Re-Evaluation  11/14/19    Authorization Type  Progress note every 10th visit, KX modifier at 15th visit    PT Start Time  0815    PT Stop Time  0859    PT Time Calculation (min)  44 min    Activity Tolerance  Patient tolerated treatment well    Behavior During Therapy  [facility] for tasks assessed/performed       Past Medical History:  Diagnosis Date  . Ankle fracture fx 13 yrs ago, anklw swells off and on, has cramps in ankle also   left, to seee dr hewitt about 02-16-14  . Arthritis   . Basal cell carcinoma 2015   Nose  . GERD (gastroesophageal reflux disease)    OTC med  . Hypertension   . Pneumonia     Past Surgical History:  Procedure Laterality Date  . ABDOMINAL HYSTERECTOMY    . EYE SURGERY Bilateral    bilaterla cataract extraction with IOL  . FRACTURE SURGERY Left 2001  . JOINT REPLACEMENT    . KNEE ARTHROSCOPY Right 02/07/2014   Procedure: RIGHT ARTHROSCOPY KNEE, WITH MEDIAL MENISCAL DEBRIDEMENT AND CHONDRAPLASTY;  Surgeon: Gearlean Alf, MD;  [facility];  Service: Orthopedics;  Laterality: Right;  . ROTATOR CUFF REPAIR Right 2009  . TOTAL ANKLE ARTHROPLASTY Left 12/20/2014   Procedure: LEFT TOTAL ANKLE ARTHOPLASTY WITH PERCUTANEOUS ACHILLES TENDON LENGTHENING;  Surgeon: Wylene Simmer, MD;  Location: [place];  Service: Orthopedics;  Laterality: Left;  . TOTAL KNEE ARTHROPLASTY Right 06/08/2016   Procedure: RIGHT TOTAL KNEE ARTHROPLASTY;  Surgeon: Gaynelle Arabian, MD;  [facility];  Service: Orthopedics;  Laterality: Right;    There were no  vitals filed for this visit.  Subjective Assessment - 10/12/19 0817    Subjective  COVID-19 screening performed upon arrival. Patient arrived feeling about the same, increased sorenss with movements and less at rest.    Pertinent History  right massive rotator cuff tear (irreparable), knee replacement, ankle replacement, HTN    Limitations  Lifting;House hold activities    Diagnostic tests  MRI: massive tear of right rotator cuff    Patient Stated Goals  decrease pain, impove movement    Currently in Pain?  Yes    Pain Score  6     Pain Location  Shoulder    Pain Orientation  Right    Pain Descriptors / Indicators  Sharp    Pain Type  Chronic pain    Pain Onset  More than a month ago    Pain Frequency  Constant    Aggravating Factors   movement    Pain Relieving Factors  at rest                       [facility] Adult PT Treatment/Exercise - 10/12/19 0001      Electrical Stimulation   Electrical Stimulation Location  Right shoulder    Electrical Stimulation Action  IFC    Electrical Stimulation Parameters  80-150hz  x36min    Electrical Stimulation Goals  Pain      Vasopneumatic   Number Minutes Vasopneumatic   15 minutes    Vasopnuematic Location   Shoulder    Vasopneumatic Pressure  Low    Vasopneumatic Temperature   34 for edema      Manual Therapy   Manual Therapy  Passive ROM    Passive ROM  manual PROM to right shoulder flexion, IR, ER with gentle range to inprove mobility                  PT Long Term Goals - 10/10/19 0924      PT LONG TERM GOAL #1   Title  Patient will be independent with HEP.    Time  4    Period  Weeks    Status  New      PT LONG TERM GOAL #2   Title  Patient will report ability to perform ADLs with right shoulder pain less than or equal to 4/10.    Time  4    Period  Weeks    Status  New      PT LONG TERM GOAL #3   Title  Patient will demonstrate 40+ degrees of right shoulder ER AROM to improve ability to don/doff  apparel.    Time  4    Period  Weeks    Status  New      PT LONG TERM GOAL #4   Title  Patient will demonstrate 90+ degrees of right shoulder flexion AROM to improve ability to perform shoulder height activities.    Time  4    Period  Weeks    Status  New      PT LONG TERM GOAL #5   Title  --            Plan - 10/12/19 0846    Clinical Impression Statement  Patient tolerated treatment well today. Patient reported soreness with movements and no pain at rest. Today focued on PROM per MD refferal to improve mobility. Today patient had a good range with all motions. patient reported doing HEP as directed by PT 1x daily. Current goals ongoing at this time. Normal response upon removal of modalities.    Personal Factors and Comorbidities  Age;Comorbidity 2    Comorbidities  right massive rotator cuff tear (irreparable), knee replacement, ankle replacement, HTN    Examination-Activity Limitations  Dressing;Hygiene/Grooming;Reach Overhead    Examination-Participation Restrictions  Cleaning    Stability/Clinical Decision Making  Stable/Uncomplicated    Rehab Potential  Fair    PT Frequency  2x / week    PT Duration  4 weeks    PT Treatment/Interventions  ADLs/Self Care Home Management;Cryotherapy;Electrical Stimulation;Iontophoresis 4mg /ml Dexamethasone;Moist Heat;Ultrasound;Therapeutic exercise;Neuromuscular re-education;Manual techniques;Passive range of motion;Therapeutic activities;Patient/family education;Vasopneumatic Device    PT Next Visit Plan  conservative treatment to right shoulder per referral, PROM, Ultrasound, modalities PRN for pain relief    Consulted and Agree with Plan of Care  Patient       Patient will benefit from skilled therapeutic intervention in order to improve the following deficits and impairments:  Decreased activity tolerance, Decreased strength, Decreased range of motion, Pain, Postural dysfunction, Impaired UE functional use  Visit Diagnosis: Chronic  right shoulder pain  Muscle weakness (generalized)  Abnormal posture     Problem List Patient Active Problem List   Diagnosis Date Noted  . Morbid obesity ([place]) 11/18/2018  . OA (osteoarthritis) of knee 06/08/2016  . Arthritis of knee, right 01/31/2016  . Hyperlipidemia  09/02/2015  . Pre-diabetes 07/12/2015  . Essential hypertension 07/03/2015  . Post-traumatic arthritis of ankle 12/20/2014  . Acute medial meniscal tear 02/06/2014  . Low bone mass 12/14/2012  . GAD (generalized anxiety disorder) 12/14/2012  . Vitamin D deficiency 12/14/2012    Phillips Climes, PTA 10/12/2019, 9:01 AM  [facility] [place], [place], [address] Phone: [phone]   Fax:  [phone]  Name: Mary Moss MRN: [id number] Date of Birth: October 07, 1935

## 2019-10-17 ENCOUNTER — Encounter: Payer: Self-pay | Admitting: Physical Therapy

## 2019-10-17 ENCOUNTER — Ambulatory Visit: Payer: Medicare Other | Admitting: Physical Therapy

## 2019-10-17 ENCOUNTER — Other Ambulatory Visit: Payer: Self-pay

## 2019-10-17 DIAGNOSIS — M25511 Pain in right shoulder: Secondary | ICD-10-CM | POA: Diagnosis not present

## 2019-10-17 DIAGNOSIS — M6281 Muscle weakness (generalized): Secondary | ICD-10-CM

## 2019-10-17 DIAGNOSIS — G8929 Other chronic pain: Secondary | ICD-10-CM

## 2019-10-17 NOTE — Therapy (Signed)
[facility] [place], [place], [address] Phone: [phone]   Fax:  [phone]  Physical Therapy Treatment  Patient Details  Name: Mary Moss MRN: [id number] Date of Birth: Jun 17, 1936 Referring Provider (PT): Grier Mitts, [place]   Encounter Date: 10/17/2019  PT End of Session - 10/17/19 [id number]    Visit Number  3    Number of Visits  8    Date for PT Re-Evaluation  11/14/19    Authorization Type  Progress note every 10th visit, KX modifier at 15th visit    PT Start Time  0816    PT Stop Time  0859    PT Time Calculation (min)  43 min    Activity Tolerance  Patient tolerated treatment well    Behavior During Therapy  [facility] for tasks assessed/performed       Past Medical History:  Diagnosis Date  . Ankle fracture fx 13 yrs ago, anklw swells off and on, has cramps in ankle also   left, to seee dr hewitt about 02-16-14  . Arthritis   . Basal cell carcinoma 2015   Nose  . GERD (gastroesophageal reflux disease)    OTC med  . Hypertension   . Pneumonia     Past Surgical History:  Procedure Laterality Date  . ABDOMINAL HYSTERECTOMY    . EYE SURGERY Bilateral    bilaterla cataract extraction with IOL  . FRACTURE SURGERY Left 2001  . JOINT REPLACEMENT    . KNEE ARTHROSCOPY Right 02/07/2014   Procedure: RIGHT ARTHROSCOPY KNEE, WITH MEDIAL MENISCAL DEBRIDEMENT AND CHONDRAPLASTY;  Surgeon: Gearlean Alf, MD;  [facility];  Service: Orthopedics;  Laterality: Right;  . ROTATOR CUFF REPAIR Right 2009  . TOTAL ANKLE ARTHROPLASTY Left 12/20/2014   Procedure: LEFT TOTAL ANKLE ARTHOPLASTY WITH PERCUTANEOUS ACHILLES TENDON LENGTHENING;  Surgeon: Wylene Simmer, MD;  Location: [place];  Service: Orthopedics;  Laterality: Left;  . TOTAL KNEE ARTHROPLASTY Right 06/08/2016   Procedure: RIGHT TOTAL KNEE ARTHROPLASTY;  Surgeon: Gaynelle Arabian, MD;  [facility];  Service: Orthopedics;  Laterality: Right;    There were no  vitals filed for this visit.  Subjective Assessment - 10/17/19 0810    Subjective  COVID 19 screening performed on patient upon arrival. Reports maybe only some improvement in loosening it.    Pertinent History  right massive rotator cuff tear (irreparable), knee replacement, ankle replacement, HTN    Limitations  Lifting;House hold activities    Diagnostic tests  MRI: massive tear of right rotator cuff    Patient Stated Goals  decrease pain, impove movement    Currently in Pain?  Yes    Pain Score  --   No numerical pain score provided by patient   Pain Location  Shoulder    Pain Orientation  Right    Pain Descriptors / Indicators  Tightness    Pain Type  Chronic pain    Pain Onset  More than a month ago    Pain Frequency  Constant         [facility] PT Assessment - 10/17/19 0001      Assessment   Medical Diagnosis  right shoulder massive RCT (treating conservatively)    Referring Provider (PT)  Grier Mitts, PA-C    Hand Dominance  Right    Next MD Visit  11/01/2019    Prior Therapy  no      Precautions   Precautions  Other (comment)    Precaution Comments  massive RTC tear, treat conservatively per referral                   [facility] Adult PT Treatment/Exercise - 10/17/19 0001      Modalities   Modalities  Electrical Stimulation;Vasopneumatic      Acupuncturist Location  R shoulder    Electrical Stimulation Action  Pre-Mod    Electrical Stimulation Parameters  80-150 hz x15 min    Electrical Stimulation Goals  Pain      Vasopneumatic   Number Minutes Vasopneumatic   15 minutes    Vasopnuematic Location   Shoulder    Vasopneumatic Pressure  Low    Vasopneumatic Temperature   34 for edema      Manual Therapy   Manual Therapy  Passive ROM    Passive ROM  PROM of R shoulder into flexion, ER, IR with gentle holds at end range and intermittant oscillations                  PT Long Term Goals - 10/10/19 [id number]       PT LONG TERM GOAL #1   Title  Patient will be independent with HEP.    Time  4    Period  Weeks    Status  New      PT LONG TERM GOAL #2   Title  Patient will report ability to perform ADLs with right shoulder pain less than or equal to 4/10.    Time  4    Period  Weeks    Status  New      PT LONG TERM GOAL #3   Title  Patient will demonstrate 40+ degrees of right shoulder ER AROM to improve ability to don/doff apparel.    Time  4    Period  Weeks    Status  New      PT LONG TERM GOAL #4   Title  Patient will demonstrate 90+ degrees of right shoulder flexion AROM to improve ability to perform shoulder height activities.    Time  4    Period  Weeks    Status  New      PT LONG TERM GOAL #5   Title  --            Plan - 10/17/19 0907    Clinical Impression Statement  Patient presented in clinic with reports of minimal improvement in reducing tightness of R shoulder ROM. Gentle PROM completed in all directions with reports tightness and discomfort intermittantly at end range flexion. Patient still unable to complete antigravity motion independently. Firm end feels and smooth arc of motion noted during PROM of R shoulder in all directions. Normal modalities response noted following removal of the modalities.    Personal Factors and Comorbidities  Age;Comorbidity 2    Comorbidities  right massive rotator cuff tear (irreparable), knee replacement, ankle replacement, HTN    Examination-Activity Limitations  Dressing;Hygiene/Grooming;Reach Overhead    Examination-Participation Restrictions  Cleaning    Stability/Clinical Decision Making  Stable/Uncomplicated    Rehab Potential  Fair    PT Frequency  2x / week    PT Duration  4 weeks    PT Treatment/Interventions  ADLs/Self Care Home Management;Cryotherapy;Electrical Stimulation;Iontophoresis 4mg /ml Dexamethasone;Moist Heat;Ultrasound;Therapeutic exercise;Neuromuscular re-education;Manual techniques;Passive range of  motion;Therapeutic activities;Patient/family education;Vasopneumatic Device    PT Next Visit Plan  conservative treatment to right shoulder per referral, PROM, Ultrasound, modalities PRN for pain relief    PT Home  Exercise Plan  see patient education section    Consulted and Agree with Plan of Care  Patient       Patient will benefit from skilled therapeutic intervention in order to improve the following deficits and impairments:  Decreased activity tolerance, Decreased strength, Decreased range of motion, Pain, Postural dysfunction, Impaired UE functional use  Visit Diagnosis: Chronic right shoulder pain  Muscle weakness (generalized)     Problem List Patient Active Problem List   Diagnosis Date Noted  . Morbid obesity ([place]) 11/18/2018  . OA (osteoarthritis) of knee 06/08/2016  . Arthritis of knee, right 01/31/2016  . Hyperlipidemia 09/02/2015  . Pre-diabetes 07/12/2015  . Essential hypertension 07/03/2015  . Post-traumatic arthritis of ankle 12/20/2014  . Acute medial meniscal tear 02/06/2014  . Low bone mass 12/14/2012  . GAD (generalized anxiety disorder) 12/14/2012  . Vitamin D deficiency 12/14/2012    Standley Brooking, PTA 10/17/2019, 9:16 AM  [facility] [address] [place], [place], [address] Phone: [phone]   Fax:  [phone]  Name: Mary Moss MRN: [id number] Date of Birth: Sep 25, 1935

## 2019-10-19 ENCOUNTER — Ambulatory Visit: Payer: Medicare Other | Admitting: Physical Therapy

## 2019-10-19 ENCOUNTER — Other Ambulatory Visit: Payer: Self-pay

## 2019-10-19 DIAGNOSIS — M25511 Pain in right shoulder: Secondary | ICD-10-CM | POA: Diagnosis not present

## 2019-10-19 DIAGNOSIS — G8929 Other chronic pain: Secondary | ICD-10-CM

## 2019-10-19 DIAGNOSIS — M6281 Muscle weakness (generalized): Secondary | ICD-10-CM

## 2019-10-19 NOTE — Therapy (Signed)
[facility] [place], [place], [address] Phone: [phone]   Fax:  [phone]  Physical Therapy Treatment  Patient Details  Name: Mary Moss MRN: [id number] Date of Birth: 07-Mar-1936 Referring Provider (PT): Grier Mitts, [place]   Encounter Date: 10/19/2019  PT End of Session - 10/19/19 0828    Visit Number  4    Number of Visits  8    Date for PT Re-Evaluation  11/14/19    Authorization Type  Progress note every 10th visit, KX modifier at 15th visit    PT Start Time  0815    PT Stop Time  0857    PT Time Calculation (min)  42 min    Activity Tolerance  Patient tolerated treatment well    Behavior During Therapy  [facility] for tasks assessed/performed       Past Medical History:  Diagnosis Date  . Ankle fracture fx 13 yrs ago, anklw swells off and on, has cramps in ankle also   left, to seee dr hewitt about 02-16-14  . Arthritis   . Basal cell carcinoma 2015   Nose  . GERD (gastroesophageal reflux disease)    OTC med  . Hypertension   . Pneumonia     Past Surgical History:  Procedure Laterality Date  . ABDOMINAL HYSTERECTOMY    . EYE SURGERY Bilateral    bilaterla cataract extraction with IOL  . FRACTURE SURGERY Left 2001  . JOINT REPLACEMENT    . KNEE ARTHROSCOPY Right 02/07/2014   Procedure: RIGHT ARTHROSCOPY KNEE, WITH MEDIAL MENISCAL DEBRIDEMENT AND CHONDRAPLASTY;  Surgeon: Gearlean Alf, MD;  Location: [facility];  Service: Orthopedics;  Laterality: Right;  . ROTATOR CUFF REPAIR Right 2009  . TOTAL ANKLE ARTHROPLASTY Left 12/20/2014   Procedure: LEFT TOTAL ANKLE ARTHOPLASTY WITH PERCUTANEOUS ACHILLES TENDON LENGTHENING;  Surgeon: Wylene Simmer, MD;  Location: [place];  Service: Orthopedics;  Laterality: Left;  . TOTAL KNEE ARTHROPLASTY Right 06/08/2016   Procedure: RIGHT TOTAL KNEE ARTHROPLASTY;  Surgeon: Gaynelle Arabian, MD;  Location: [facility];  Service: Orthopedics;  Laterality: Right;    There were no  vitals filed for this visit.  Subjective Assessment - 10/19/19 0820    Subjective  COVID 19 screening performed on patient upon arrival. Reported sorenss with certain movements    Pertinent History  right massive rotator cuff tear (irreparable), knee replacement, ankle replacement, HTN    Limitations  Lifting;House hold activities    Diagnostic tests  MRI: massive tear of right rotator cuff    Patient Stated Goals  decrease pain, impove movement    Currently in Pain?  Yes    Pain Score  --   no number provided   Pain Location  Shoulder    Pain Orientation  Right    Pain Descriptors / Indicators  Tightness    Pain Type  Chronic pain    Pain Onset  More than a month ago    Pain Frequency  Constant    Aggravating Factors   movement    Pain Relieving Factors  at rest         [facility] PT Assessment - 10/19/19 0001      PROM   PROM Assessment Site  Shoulder    Right/Left Shoulder  Right    Right Shoulder Flexion  90 Degrees    Right Shoulder External Rotation  60 Degrees   in scaption  PT Long Term Goals - 10/19/19 [id number]      PT LONG TERM GOAL #1   Title  Patient will be independent with HEP.    Time  4    Period  Weeks    Status  On-going      PT LONG TERM GOAL #2   Title  Patient will report ability to perform ADLs with right shoulder pain less than or equal to 4/10.    Time  4    Period  Weeks    Status  On-going   Patient not doing full ADL's at this time 10/19/19     PT LONG TERM GOAL #3   Title  Patient will demonstrate 40+ degrees of right shoulder ER AROM to improve ability to don/doff apparel.    Time  4    Period  Weeks    Status  On-going      PT LONG TERM GOAL #4   Title  Patient will demonstrate 90+ degrees of right shoulder flexion AROM to improve ability to perform shoulder height activities.    Time  4    Period  Weeks    Status  On-going            Plan - 10/19/19 [id number]    Clinical  Impression Statement  Patient tolerated treatment well today. Patient reported no pain only soreness with certain movements. Patient is able to perform minimal ADL's at this time due to limitations. Patient is unable to perform antigravity movements at this time yet has improved with PROM in right shoulder. Current goals progressing.    Personal Factors and Comorbidities  Age;Comorbidity 2    Comorbidities  right massive rotator cuff tear (irreparable), knee replacement, ankle replacement, HTN    Examination-Activity Limitations  Dressing;Hygiene/Grooming;Reach Overhead    Examination-Participation Restrictions  Cleaning    Stability/Clinical Decision Making  Stable/Uncomplicated    Rehab Potential  Fair    PT Frequency  2x / week    PT Duration  4 weeks    PT Treatment/Interventions  ADLs/Self Care Home Management;Cryotherapy;Electrical Stimulation;Iontophoresis 4mg /ml Dexamethasone;Moist Heat;Ultrasound;Therapeutic exercise;Neuromuscular re-education;Manual techniques;Passive range of motion;Therapeutic activities;Patient/family education;Vasopneumatic Device    PT Next Visit Plan  conservative treatment to right shoulder per referral, PROM, Ultrasound, modalities PRN for pain relief    Consulted and Agree with Plan of Care  Patient       Patient will benefit from skilled therapeutic intervention in order to improve the following deficits and impairments:  Decreased activity tolerance, Decreased strength, Decreased range of motion, Pain, Postural dysfunction, Impaired UE functional use  Visit Diagnosis: Chronic right shoulder pain  Muscle weakness (generalized)     Problem List Patient Active Problem List   Diagnosis Date Noted  . Morbid obesity ([place]) 11/18/2018  . OA (osteoarthritis) of knee 06/08/2016  . Arthritis of knee, right 01/31/2016  . Hyperlipidemia 09/02/2015  . Pre-diabetes 07/12/2015  . Essential hypertension 07/03/2015  . Post-traumatic arthritis of ankle 12/20/2014   . Acute medial meniscal tear 02/06/2014  . Low bone mass 12/14/2012  . GAD (generalized anxiety disorder) 12/14/2012  . Vitamin D deficiency 12/14/2012    Phillips Climes, PTA 10/19/2019, 8:57 AM  [facility] [place], [place], [address] Phone: [phone]   Fax:  [phone]  Name: Mary Moss MRN: [id number] Date of Birth: 11-Aug-1935

## 2019-10-24 ENCOUNTER — Other Ambulatory Visit: Payer: Self-pay

## 2019-10-24 ENCOUNTER — Ambulatory Visit: Payer: Medicare Other | Admitting: Physical Therapy

## 2019-10-24 ENCOUNTER — Encounter: Payer: Self-pay | Admitting: Physical Therapy

## 2019-10-24 DIAGNOSIS — R293 Abnormal posture: Secondary | ICD-10-CM

## 2019-10-24 DIAGNOSIS — M6281 Muscle weakness (generalized): Secondary | ICD-10-CM

## 2019-10-24 DIAGNOSIS — M25511 Pain in right shoulder: Secondary | ICD-10-CM | POA: Diagnosis not present

## 2019-10-24 DIAGNOSIS — G8929 Other chronic pain: Secondary | ICD-10-CM

## 2019-10-24 NOTE — Therapy (Signed)
[facility] [place], [place], [address] Phone: [phone]   Fax:  [phone]  Physical Therapy Treatment  Patient Details  Name: Mary Moss MRN: [id number] Date of Birth: 1935/11/04 Referring Provider (PT): Grier Mitts, [place]   Encounter Date: 10/24/2019  PT End of Session - 10/24/19 [id number]    Visit Number  5    Number of Visits  8    Date for PT Re-Evaluation  11/14/19    Authorization Type  Progress note every 10th visit, KX modifier at 15th visit    PT Start Time  0822    PT Stop Time  0900    PT Time Calculation (min)  38 min    Activity Tolerance  Patient tolerated treatment well    Behavior During Therapy  [facility] for tasks assessed/performed       Past Medical History:  Diagnosis Date  . Ankle fracture fx 13 yrs ago, anklw swells off and on, has cramps in ankle also   left, to seee dr hewitt about 02-16-14  . Arthritis   . Basal cell carcinoma 2015   Nose  . GERD (gastroesophageal reflux disease)    OTC med  . Hypertension   . Pneumonia     Past Surgical History:  Procedure Laterality Date  . ABDOMINAL HYSTERECTOMY    . EYE SURGERY Bilateral    bilaterla cataract extraction with IOL  . FRACTURE SURGERY Left 2001  . JOINT REPLACEMENT    . KNEE ARTHROSCOPY Right 02/07/2014   Procedure: RIGHT ARTHROSCOPY KNEE, WITH MEDIAL MENISCAL DEBRIDEMENT AND CHONDRAPLASTY;  Surgeon: Gearlean Alf, MD;  Location[facility];  Service: Orthopedics;  Laterality: Right;  . ROTATOR CUFF REPAIR Right 2009  . TOTAL ANKLE ARTHROPLASTY Left 12/20/2014   Procedure: LEFT TOTAL ANKLE ARTHOPLASTY WITH PERCUTANEOUS ACHILLES TENDON LENGTHENING;  Surgeon: Wylene Simmer, MD;  Location: [place];  Service: Orthopedics;  Laterality: Left;  . TOTAL KNEE ARTHROPLASTY Right 06/08/2016   Procedure: RIGHT TOTAL KNEE ARTHROPLASTY;  Surgeon: Gaynelle Arabian, MD;  [facility];  Service: Orthopedics;  Laterality: Right;    There were no  vitals filed for this visit.  Subjective Assessment - 10/24/19 0821    Subjective  COVID 19 screening performed on patient upon arrival. Reported sorenss with certain movements but about the same.    Pertinent History  right massive rotator cuff tear (irreparable), knee replacement, ankle replacement, HTN    Limitations  Lifting;House hold activities    Diagnostic tests  MRI: massive tear of right rotator cuff    Patient Stated Goals  decrease pain, impove movement    Currently in Pain?  Yes    Pain Score  5     Pain Location  Shoulder    Pain Orientation  Right    Pain Descriptors / Indicators  Discomfort    Pain Onset  More than a month ago    Pain Frequency  Intermittent         [facility] PT Assessment - 10/24/19 0001      Assessment   Medical Diagnosis  right shoulder massive RCT (treating conservatively)    Referring Provider (PT)  Grier Mitts, PA-C    Hand Dominance  Right    Next MD Visit  11/01/2019      Precautions   Precautions  Other (comment)    Precaution Comments  massive RTC tear, treat conservatively per referral  [facility] Adult PT Treatment/Exercise - 10/24/19 0001      Modalities   Modalities  Electrical Stimulation;Vasopneumatic      Acupuncturist Location  R shoulder    Electrical Stimulation Action  IFC    Electrical Stimulation Parameters  80-150 hz x10 min    Electrical Stimulation Goals  Pain      Vasopneumatic   Number Minutes Vasopneumatic   15 minutes    Vasopnuematic Location   Shoulder    Vasopneumatic Pressure  Low    Vasopneumatic Temperature   34 for edema      Manual Therapy   Manual Therapy  Passive ROM    Passive ROM  PROM of R shoulder into flexion, ER, IR with gentle holds at end range and intermittant oscillations                  PT Long Term Goals - 10/19/19 [id number]      PT LONG TERM GOAL #1   Title  Patient will be independent with HEP.    Time  4     Period  Weeks    Status  On-going      PT LONG TERM GOAL #2   Title  Patient will report ability to perform ADLs with right shoulder pain less than or equal to 4/10.    Time  4    Period  Weeks    Status  On-going   Patient not doing full ADL's at this time 10/19/19     PT LONG TERM GOAL #3   Title  Patient will demonstrate 40+ degrees of right shoulder ER AROM to improve ability to don/doff apparel.    Time  4    Period  Weeks    Status  On-going      PT LONG TERM GOAL #4   Title  Patient will demonstrate 90+ degrees of right shoulder flexion AROM to improve ability to perform shoulder height activities.    Time  4    Period  Weeks    Status  On-going            Plan - 10/24/19 0912    Clinical Impression Statement  Patient presented in clinic with continued limitations of R shoulder ROM in flexion especially and intermittant pain depending on movement. Guarded end feels due to pain and required intermittant oscillations to reduce muscle guarding and pain. Normal modalities response noted following removal of the modalities.    Personal Factors and Comorbidities  Age;Comorbidity 2    Comorbidities  right massive rotator cuff tear (irreparable), knee replacement, ankle replacement, HTN    Examination-Activity Limitations  Dressing;Hygiene/Grooming;Reach Overhead    Examination-Participation Restrictions  Cleaning    Stability/Clinical Decision Making  Stable/Uncomplicated    Rehab Potential  Fair    PT Frequency  2x / week    PT Duration  4 weeks    PT Treatment/Interventions  ADLs/Self Care Home Management;Cryotherapy;Electrical Stimulation;Iontophoresis 4mg /ml Dexamethasone;Moist Heat;Ultrasound;Therapeutic exercise;Neuromuscular re-education;Manual techniques;Passive range of motion;Therapeutic activities;Patient/family education;Vasopneumatic Device    PT Next Visit Plan  conservative treatment to right shoulder per referral, PROM, Ultrasound, modalities PRN for pain  relief    PT Home Exercise Plan  see patient education section    Consulted and Agree with Plan of Care  Patient       Patient will benefit from skilled therapeutic intervention in order to improve the following deficits and impairments:  Decreased activity tolerance, Decreased strength, Decreased range of motion,  Pain, Postural dysfunction, Impaired UE functional use  Visit Diagnosis: Chronic right shoulder pain  Muscle weakness (generalized)  Abnormal posture     Problem List Patient Active Problem List   Diagnosis Date Noted  . Morbid obesity ([place]) 11/18/2018  . OA (osteoarthritis) of knee 06/08/2016  . Arthritis of knee, right 01/31/2016  . Hyperlipidemia 09/02/2015  . Pre-diabetes 07/12/2015  . Essential hypertension 07/03/2015  . Post-traumatic arthritis of ankle 12/20/2014  . Acute medial meniscal tear 02/06/2014  . Low bone mass 12/14/2012  . GAD (generalized anxiety disorder) 12/14/2012  . Vitamin D deficiency 12/14/2012    Standley Brooking, PTA 10/24/2019, 9:16 AM  [facility] [address] [place], [place], [address] Phone: [phone]   Fax:  [phone]  Name: FERNANDO VANHORN MRN: [id number] Date of Birth: [birth year]

## 2019-10-26 ENCOUNTER — Ambulatory Visit: Payer: Medicare Other | Attending: Surgical | Admitting: Physical Therapy

## 2019-10-26 ENCOUNTER — Encounter: Payer: Self-pay | Admitting: Physical Therapy

## 2019-10-26 ENCOUNTER — Other Ambulatory Visit: Payer: Self-pay

## 2019-10-26 DIAGNOSIS — G8929 Other chronic pain: Secondary | ICD-10-CM | POA: Diagnosis present

## 2019-10-26 DIAGNOSIS — M6281 Muscle weakness (generalized): Secondary | ICD-10-CM | POA: Diagnosis present

## 2019-10-26 DIAGNOSIS — M25511 Pain in right shoulder: Secondary | ICD-10-CM | POA: Insufficient documentation

## 2019-10-26 NOTE — Therapy (Signed)
[facility] [place], [place], [address] Phone: [phone]   Fax:  [phone]  Physical Therapy Treatment  Patient Details  Name: Mary Moss MRN: [id number] Date of Birth: 11/21/1935 Referring Provider (PT): Grier Mitts, [place]   Encounter Date: 10/26/2019  PT End of Session - 10/26/19 0819    Visit Number  6    Number of Visits  8    Date for PT Re-Evaluation  11/14/19    Authorization Type  Progress note every 10th visit, KX modifier at 15th visit    PT Start Time  0819    PT Stop Time  0906    PT Time Calculation (min)  47 min    Activity Tolerance  Patient tolerated treatment well    Behavior During Therapy  [facility] for tasks assessed/performed       Past Medical History:  Diagnosis Date  . Ankle fracture fx 13 yrs ago, anklw swells off and on, has cramps in ankle also   left, to seee dr hewitt about 02-16-14  . Arthritis   . Basal cell carcinoma 2015   Nose  . GERD (gastroesophageal reflux disease)    OTC med  . Hypertension   . Pneumonia     Past Surgical History:  Procedure Laterality Date  . ABDOMINAL HYSTERECTOMY    . EYE SURGERY Bilateral    bilaterla cataract extraction with IOL  . FRACTURE SURGERY Left 2001  . JOINT REPLACEMENT    . KNEE ARTHROSCOPY Right 02/07/2014   Procedure: RIGHT ARTHROSCOPY KNEE, WITH MEDIAL MENISCAL DEBRIDEMENT AND CHONDRAPLASTY;  Surgeon: Gearlean Alf, MD;  Location: [facility];  Service: Orthopedics;  Laterality: Right;  . ROTATOR CUFF REPAIR Right 2009  . TOTAL ANKLE ARTHROPLASTY Left 12/20/2014   Procedure: LEFT TOTAL ANKLE ARTHOPLASTY WITH PERCUTANEOUS ACHILLES TENDON LENGTHENING;  Surgeon: Wylene Simmer, MD;  Location: [place];  Service: Orthopedics;  Laterality: Left;  . TOTAL KNEE ARTHROPLASTY Right 06/08/2016   Procedure: RIGHT TOTAL KNEE ARTHROPLASTY;  Surgeon: Gaynelle Arabian, MD;  [facility];  Service: Orthopedics;  Laterality: Right;    There were no  vitals filed for this visit.  Subjective Assessment - 10/26/19 0819    Subjective  COVID 19 screening performed on patient upon arrival. Reported sorenss with certain movements but about the same.    Pertinent History  right massive rotator cuff tear (irreparable), knee replacement, ankle replacement, HTN    Limitations  Lifting;House hold activities    Diagnostic tests  MRI: massive tear of right rotator cuff    Patient Stated Goals  decrease pain, impove movement    Currently in Pain?  Yes    Pain Score  3     Pain Location  Shoulder    Pain Orientation  Right    Pain Descriptors / Indicators  Discomfort    Pain Type  Chronic pain    Pain Onset  More than a month ago    Pain Frequency  Intermittent         [facility] PT Assessment - 10/26/19 0001      Assessment   Medical Diagnosis  right shoulder massive RCT (treating conservatively)    Referring Provider (PT)  Grier Mitts, PA-C    Hand Dominance  Right    Next MD Visit  11/01/2019      Precautions   Precautions  Other (comment)    Precaution Comments  massive RTC tear, treat conservatively per referral      ROM /  Strength   AROM / PROM / Strength  AROM      AROM   Overall AROM   Deficits    AROM Assessment Site  Shoulder    Right/Left Shoulder  Right    Right Shoulder Flexion  38 Degrees   in sitting; with shoulder elevation compensation noted   Right Shoulder External Rotation  26 Degrees                   [facility] Adult PT Treatment/Exercise - 10/26/19 0001      Modalities   Modalities  Electrical Stimulation;Vasopneumatic      Acupuncturist Location  R shoulder    Electrical Stimulation Action  IFC    Electrical Stimulation Parameters  80-150 hz x34mn    Electrical Stimulation Goals  Pain      Vasopneumatic   Number Minutes Vasopneumatic   15 minutes    Vasopnuematic Location   Shoulder    Vasopneumatic Pressure  Low    Vasopneumatic Temperature   34 for  edema      Manual Therapy   Manual Therapy  Passive ROM    Passive ROM  PROM of R shoulder into flexion, ER, IR with gentle holds at end range and intermittant oscillations                  PT Long Term Goals - 10/26/19 08937     PT LONG TERM GOAL #1   Title  Patient will be independent with HEP.    Time  4    Period  Weeks    Status  Achieved      PT LONG TERM GOAL #2   Title  Patient will report ability to perform ADLs with right shoulder pain less than or equal to 4/10.    Time  4    Period  Weeks    Status  Partially Met   Depending on the activity but limited with housework 10/26/2019     PT LONG TERM GOAL #3   Title  Patient will demonstrate 40+ degrees of right shoulder ER AROM to improve ability to don/doff apparel.    Time  4    Period  Weeks    Status  Not Met      PT LONG TERM GOAL #4   Title  Patient will demonstrate 90+ degrees of right shoulder flexion AROM to improve ability to perform shoulder height activities.    Time  4    Period  Weeks    Status  Not Met            Plan - 10/26/19 0919    Clinical Impression Statement  Patient presented in clinic with reports of continued R shoulder pain and limitations with ADLs and ROM. Any antigravity motion is greatly limited and painful. Patient can manage to dress herself but reported uncomfortable sensation with R shoulder. Frequent sessions of oscillations were required during PROM session to reduce muscle guarding and pain. Patient compliant with HEP provided at evaluation. Normal modalities response noted following removal of the modalities.    Personal Factors and Comorbidities  Age;Comorbidity 2    Comorbidities  right massive rotator cuff tear (irreparable), knee replacement, ankle replacement, HTN    Examination-Activity Limitations  Dressing;Hygiene/Grooming;Reach Overhead    Examination-Participation Restrictions  Cleaning    Stability/Clinical Decision Making  Stable/Uncomplicated    Rehab  Potential  Fair    PT Frequency  2x / week  PT Duration  4 weeks    PT Treatment/Interventions  ADLs/Self Care Home Management;Cryotherapy;Electrical Stimulation;Iontophoresis 74m/ml Dexamethasone;Moist Heat;Ultrasound;Therapeutic exercise;Neuromuscular re-education;Manual techniques;Passive range of motion;Therapeutic activities;Patient/family education;Vasopneumatic Device    PT Next Visit Plan  Hold treatment until next MD visit on 11/01/2019.    PT Home Exercise Plan  see patient education section    Consulted and Agree with Plan of Care  Patient       Patient will benefit from skilled therapeutic intervention in order to improve the following deficits and impairments:  Decreased activity tolerance, Decreased strength, Decreased range of motion, Pain, Postural dysfunction, Impaired UE functional use  Visit Diagnosis: Chronic right shoulder pain  Muscle weakness (generalized)     Problem List Patient Active Problem List   Diagnosis Date Noted  . Morbid obesity ([place] 11/18/2018  . OA (osteoarthritis) of knee 06/08/2016  . Arthritis of knee, right 01/31/2016  . Hyperlipidemia 09/02/2015  . Pre-diabetes 07/12/2015  . Essential hypertension 07/03/2015  . Post-traumatic arthritis of ankle 12/20/2014  . Acute medial meniscal tear 02/06/2014  . Low bone mass 12/14/2012  . GAD (generalized anxiety disorder) 12/14/2012  . Vitamin D deficiency 12/14/2012    KStandley Brooking PTA 10/26/19 9:31 AM   [facility] [address] [place] [address]Phone: [phone]  Fax:  [phone] Name: CSHANIAH BALTESMRN: [id number] of Birth: [birth year]

## 2019-11-02 ENCOUNTER — Other Ambulatory Visit: Payer: Self-pay | Admitting: Orthopedic Surgery

## 2019-11-10 NOTE — Patient Instructions (Addendum)
DUE TO COVID-19 ONLY ONE VISITOR IS ALLOWED TO COME WITH YOU AND STAY IN THE WAITING ROOM ONLY DURING PRE OP AND PROCEDURE DAY OF SURGERY. TWO  VISITOR MAY VISIT WITH YOU AFTER SURGERY IN YOUR PRIVATE ROOM DURING VISITING HOURS ONLY!  10am-8pm  YOU NEED TO HAVE A COVID 19 TEST ON_4-26-21______ @_0830  am _____, THIS TEST MUST BE DONE BEFORE SURGERY, COME  [place], [place] , [id number].  ([place]) ONCE YOUR COVID TEST IS COMPLETED, PLEASE BEGIN THE QUARANTINE INSTRUCTIONS AS OUTLINED IN YOUR HANDOUT.                Mary Moss  11/10/2019   Your procedure is scheduled on:   11-23-19   Report to [facility] Main  Entrance   Report to  Short stay  at       0530  AM     Call this number if you have problems the morning of surgery [phone]    Remember: NO SOLID FOOD AFTER MIDNIGHT THE NIGHT PRIOR TO SURGERY. NOTHING BY MOUTH EXCEPT CLEAR LIQUIDS UNTIL    0430 am  . PLEASE FINISH ENSURE DRINK PER SURGEON ORDER  WHICH NEEDS TO BE COMPLETED AT     0430 am then nothing by mouth.    CLEAR LIQUID DIET   Foods Allowed                                                                                 Foods Excluded  Coffee and tea, regular and decaf    No creamer                         liquids that you cannot  Plain Jell-O any favor except red or purple                                           see through such as: Fruit ices (not with fruit pulp)                                                              milk, soups, orange juice  Iced Popsicles                                                             All solid food Carbonated beverages, regular and diet                                    Cranberry, grape and apple juices Sports drinks like Gatorade Lightly seasoned clear broth or  consume(fat free) Sugar, honey syrup   _____________________________________________________________________    BRUSH YOUR TEETH MORNING OF SURGERY AND RINSE  YOUR MOUTH OUT, NO CHEWING GUM CANDY OR MINTS.     Take these medicines the morning of surgery with A SIP OF WATER: NONE               You may not have any metal on your body including hair pins and              piercings  Do not wear jewelry, make-up, lotions, powders or perfumes, deodorant             Do not wear nail polish on your fingernails.  Do not shave  48 hours prior to surgery.     Do not bring valuables to the hospital. [place].  Contacts, dentures or bridgework may not be worn into surgery.  Leave suitcase in the car. After surgery it may be brought to your room.     Patients discharged the day of surgery will not be allowed to drive home. IF YOU ARE HAVING SURGERY AND GOING HOME THE SAME DAY, YOU MUST HAVE AN ADULT TO DRIVE YOU HOME AND BE WITH YOU FOR 24 HOURS. YOU MAY GO HOME BY TAXI OR UBER OR ORTHERWISE, BUT AN ADULT MUST ACCOMPANY YOU HOME AND STAY WITH YOU FOR 24 HOURS.  Name and phone number of your driver:  Special Instructions: N/A              Please read over the following fact sheets you were given: _____________________________________________________________________  [facility]- Preparing for Total Shoulder Arthroplasty    Before surgery, you can play an important role. Because skin is not sterile, your skin needs to be as free of germs as possible. You can reduce the number of germs on your skin by using the following products. . Benzoyl Peroxide Gel o Reduces the number of germs present on the skin o Applied twice a day to shoulder area starting two days before surgery    ==================================================================  Please follow these instructions carefully:  BENZOYL PEROXIDE 5% GEL  Please do not use if you have an allergy to benzoyl peroxide.   If your skin becomes reddened/irritated stop using the benzoyl peroxide.  Starting two days before surgery, apply as  follows: 1. Apply benzoyl peroxide in the morning and at night. Apply after taking a shower. If you are not taking a shower clean entire shoulder front, back, and side along with the armpit with a clean wet washcloth.  2. Place a quarter-sized dollop on your shoulder and rub in thoroughly, making sure to cover the front, back, and side of your shoulder, along with the armpit.   2 days before ____ AM   ____ PM              1 day before ____ AM   ____ PM                         3. Do this twice a day for two days.  (Last application is the night before surgery, AFTER using the CHG soap as described below).  4. Do NOT apply benzoyl peroxide gel on the day of surgery.            [facility] - Preparing for Surgery Before surgery, you can  play an important role.  Because skin is not sterile, your skin needs to be as free of germs as possible.  You can reduce the number of germs on your skin by washing with CHG (chlorahexidine gluconate) soap before surgery.  CHG is an antiseptic cleaner which kills germs and bonds with the skin to continue killing germs even after washing. Please DO NOT use if you have an allergy to CHG or antibacterial soaps.  If your skin becomes reddened/irritated stop using the CHG and inform your nurse when you arrive at Short Stay. Do not shave (including legs and underarms) for at least 48 hours prior to the first CHG shower.  You may shave your face/neck. Please follow these instructions carefully:  1.  Shower with CHG Soap the night before surgery and the  morning of Surgery.  2.  If you choose to wash your hair, wash your hair first as usual with your  normal  shampoo.  3.  After you shampoo, rinse your hair and body thoroughly to remove the  shampoo.                           4.  Use CHG as you would any other liquid soap.  You can apply chg directly  to the skin and wash                       Gently with a scrungie or clean washcloth.  5.  Apply the CHG Soap to your body  ONLY FROM THE NECK DOWN.   Do not use on face/ open                           Wound or open sores. Avoid contact with eyes, ears mouth and genitals (private parts).                       Wash face,  Genitals (private parts) with your normal soap.             6.  Wash thoroughly, paying special attention to the area where your surgery  will be performed.  7.  Thoroughly rinse your body with warm water from the neck down.  8.  DO NOT shower/wash with your normal soap after using and rinsing off  the CHG Soap.                9.  Pat yourself dry with a clean towel.            10.  Wear clean pajamas.            11.  Place clean sheets on your bed the night of your first shower and do not  sleep with pets. Day of Surgery : Do not apply any lotions/deodorants the morning of surgery.  Please wear clean clothes to the hospital/surgery center.  FAILURE TO FOLLOW THESE INSTRUCTIONS MAY RESULT IN THE CANCELLATION OF YOUR SURGERY PATIENT SIGNATURE_________________________________  NURSE SIGNATURE__________________________________  ________________________________________________________________________   Adam Phenix  An incentive spirometer is a tool that can help keep your lungs clear and active. This tool measures how well you are filling your lungs with each breath. Taking long deep breaths may help reverse or decrease the chance of developing breathing (pulmonary) problems (especially infection) following:  A long period of time when you are unable to move or be active. BEFORE  THE PROCEDURE   If the spirometer includes an indicator to show your best effort, your nurse or respiratory therapist will set it to a desired goal.  If possible, sit up straight or lean slightly forward. Try not to slouch.  Hold the incentive spirometer in an upright position. INSTRUCTIONS FOR USE  1. Sit on the edge of your bed if possible, or sit up as far as you can in bed or on a chair. 2. Hold the  incentive spirometer in an upright position. 3. Breathe out normally. 4. Place the mouthpiece in your mouth and seal your lips tightly around it. 5. Breathe in slowly and as deeply as possible, raising the piston or the ball toward the top of the column. 6. Hold your breath for 3-5 seconds or for as long as possible. Allow the piston or ball to fall to the bottom of the column. 7. Remove the mouthpiece from your mouth and breathe out normally. 8. Rest for a few seconds and repeat Steps 1 through 7 at least 10 times every 1-2 hours when you are awake. Take your time and take a few normal breaths between deep breaths. 9. The spirometer may include an indicator to show your best effort. Use the indicator as a goal to work toward during each repetition. 10. After each set of 10 deep breaths, practice coughing to be sure your lungs are clear. If you have an incision (the cut made at the time of surgery), support your incision when coughing by placing a pillow or rolled up towels firmly against it. Once you are able to get out of bed, walk around indoors and cough well. You may stop using the incentive spirometer when instructed by your caregiver.  RISKS AND COMPLICATIONS  Take your time so you do not get dizzy or light-headed.  If you are in pain, you may need to take or ask for pain medication before doing incentive spirometry. It is harder to take a deep breath if you are having pain. AFTER USE  Rest and breathe slowly and easily.  It can be helpful to keep track of a log of your progress. Your caregiver can provide you with a simple table to help with this. If you are using the spirometer at home, follow these instructions: [place] IF:   You are having difficultly using the spirometer.  You have trouble using the spirometer as often as instructed.  Your pain medication is not giving enough relief while using the spirometer.  You develop fever of 100.5 F (38.1 C) or  higher. SEEK IMMEDIATE MEDICAL CARE IF:   You cough up bloody sputum that had not been present before.  You develop fever of 102 F (38.9 C) or greater.  You develop worsening pain at or near the incision site. MAKE SURE YOU:   Understand these instructions.  Will watch your condition.  Will get help right away if you are not doing well or get worse. Document Released: 11/23/2006 Document Revised: 10/05/2011 Document Reviewed: 01/24/2007 ExitCare Patient Information 2014 ExitCare, [place].   ________________________________________________________________________  WHAT IS A BLOOD TRANSFUSION? Blood Transfusion Information  A transfusion is the replacement of blood or some of its parts. Blood is made up of multiple cells which provide different functions.  Red blood cells carry oxygen and are used for blood loss replacement.  White blood cells fight against infection.  Platelets control bleeding.  Plasma helps clot blood.  Other blood products are available for specialized needs, such as  hemophilia or other clotting disorders. BEFORE THE TRANSFUSION  Who gives blood for transfusions?   Healthy volunteers who are fully evaluated to make sure their blood is safe. This is blood bank blood. Transfusion therapy is the safest it has ever been in the practice of medicine. Before blood is taken from a donor, a complete history is taken to make sure that person has no history of diseases nor engages in risky social behavior (examples are intravenous drug use or sexual activity with multiple partners). The donor's travel history is screened to minimize risk of transmitting infections, such as malaria. The donated blood is tested for signs of infectious diseases, such as HIV and hepatitis. The blood is then tested to be sure it is compatible with you in order to minimize the chance of a transfusion reaction. If you or a relative donates blood, this is often done in anticipation of surgery  and is not appropriate for emergency situations. It takes many days to process the donated blood. RISKS AND COMPLICATIONS Although transfusion therapy is very safe and saves many lives, the main dangers of transfusion include:   Getting an infectious disease.  Developing a transfusion reaction. This is an allergic reaction to something in the blood you were given. Every precaution is taken to prevent this. The decision to have a blood transfusion has been considered carefully by your caregiver before blood is given. Blood is not given unless the benefits outweigh the risks. AFTER THE TRANSFUSION  Right after receiving a blood transfusion, you will usually feel much better and more energetic. This is especially true if your red blood cells have gotten low (anemic). The transfusion raises the level of the red blood cells which carry oxygen, and this usually causes an energy increase.  The nurse administering the transfusion will monitor you carefully for complications. HOME CARE INSTRUCTIONS  No special instructions are needed after a transfusion. You may find your energy is better. Speak with your caregiver about any limitations on activity for underlying diseases you may have. SEEK MEDICAL CARE IF:   Your condition is not improving after your transfusion.  You develop redness or irritation at the intravenous (IV) site. SEEK IMMEDIATE MEDICAL CARE IF:  Any of the following symptoms occur over the next 12 hours:  Shaking chills.  You have a temperature by mouth above 102 F (38.9 C), not controlled by medicine.  Chest, back, or muscle pain.  People around you feel you are not acting correctly or are confused.  Shortness of breath or difficulty breathing.  Dizziness and fainting.  You get a rash or develop hives.  You have a decrease in urine output.  Your urine turns a dark color or changes to pink, red, or brown. Any of the following symptoms occur over the next 10  days:  You have a temperature by mouth above 102 F (38.9 C), not controlled by medicine.  Shortness of breath.  Weakness after normal activity.  The white part of the eye turns yellow (jaundice).  You have a decrease in the amount of urine or are urinating less often.  Your urine turns a dark color or changes to pink, red, or brown. Document Released: 07/10/2000 Document Revised: 10/05/2011 Document Reviewed: 02/27/2008 [facility] Patient Information 2014 [place], [place].  _______________________________________________________________________

## 2019-11-14 ENCOUNTER — Encounter (HOSPITAL_COMMUNITY): Payer: Self-pay

## 2019-11-14 ENCOUNTER — Other Ambulatory Visit: Payer: Self-pay

## 2019-11-14 ENCOUNTER — Encounter (HOSPITAL_COMMUNITY)
Admission: RE | Admit: 2019-11-14 | Discharge: 2019-11-14 | Disposition: A | Discharge status: Home / Self Care | Payer: Medicare Other | Source: Ambulatory Visit | Attending: Orthopedic Surgery | Admitting: Orthopedic Surgery

## 2019-11-14 DIAGNOSIS — Z01812 Encounter for preprocedural laboratory examination: Secondary | ICD-10-CM | POA: Insufficient documentation

## 2019-11-14 NOTE — Progress Notes (Signed)
PCP - Evelina Dun FNP   lov 08-24-19 epic Cardiologist -   Chest x-ray - 11-16-19 epic EKG - 11-16-19 epic Stress Test -  ECHO -  Cardiac Cath -   Sleep Study -  CPAP -   Fasting Blood Sugar -  Checks Blood Sugar _____ times a day  Blood Thinner Instructions: Aspirin Instructions: Last Dose:  Anesthesia review: ABN EKG  Patient denies shortness of breath, fever, cough and chest pain at PAT appointment   NONE   Patient verbalized understanding of instructions that were given to them at the PAT appointment. Patient was also instructed that they will need to review over the PAT instructions again at home before surgery.

## 2019-11-16 ENCOUNTER — Other Ambulatory Visit: Payer: Self-pay

## 2019-11-16 ENCOUNTER — Other Ambulatory Visit: Payer: Self-pay | Admitting: Family

## 2019-11-16 ENCOUNTER — Encounter (HOSPITAL_COMMUNITY)
Admission: RE | Admit: 2019-11-16 | Discharge: 2019-11-16 | Disposition: A | Discharge status: Home / Self Care | Payer: Medicare Other | Source: Ambulatory Visit | Attending: Orthopedic Surgery | Admitting: Orthopedic Surgery

## 2019-11-16 ENCOUNTER — Ambulatory Visit (HOSPITAL_COMMUNITY)
Admission: RE | Admit: 2019-11-16 | Discharge: 2019-11-16 | Disposition: A | Discharge status: Home / Self Care | Payer: Medicare Other | Source: Ambulatory Visit | Attending: Orthopedic Surgery | Admitting: Orthopedic Surgery

## 2019-11-16 DIAGNOSIS — Z01818 Encounter for other preprocedural examination: Secondary | ICD-10-CM | POA: Diagnosis not present

## 2019-11-16 DIAGNOSIS — Z01811 Encounter for preprocedural respiratory examination: Secondary | ICD-10-CM

## 2019-11-16 DIAGNOSIS — R9431 Abnormal electrocardiogram [ECG] [EKG]: Secondary | ICD-10-CM | POA: Diagnosis not present

## 2019-11-16 DIAGNOSIS — I10 Essential (primary) hypertension: Secondary | ICD-10-CM

## 2019-11-16 DIAGNOSIS — I493 Ventricular premature depolarization: Secondary | ICD-10-CM | POA: Insufficient documentation

## 2019-11-16 LAB — PROTIME-INR
INR: 1 (ref 0.8–1.2)
Prothrombin Time: 13.5 seconds (ref 11.4–15.2)

## 2019-11-16 LAB — CBC WITH DIFFERENTIAL/PLATELET
Abs Immature Granulocytes: 0.02 10*3/uL (ref 0.00–0.07)
Basophils Absolute: 0.1 10*3/uL (ref 0.0–0.1)
Basophils Relative: 1 %
Eosinophils Absolute: 0.2 10*3/uL (ref 0.0–0.5)
Eosinophils Relative: 3 %
HCT: 42.4 % (ref 36.0–46.0)
Hemoglobin: 13.1 g/dL (ref 12.0–15.0)
Immature Granulocytes: 0 %
Lymphocytes Relative: 32 %
Lymphs Abs: 2.4 10*3/uL (ref 0.7–4.0)
MCH: 29.7 pg (ref 26.0–34.0)
MCHC: 30.9 g/dL (ref 30.0–36.0)
MCV: 96.1 fL (ref 80.0–100.0)
Monocytes Absolute: 0.6 10*3/uL (ref 0.1–1.0)
Monocytes Relative: 8 %
Neutro Abs: 4.2 10*3/uL (ref 1.7–7.7)
Neutrophils Relative %: 56 %
Platelets: 308 10*3/uL (ref 150–400)
RBC: 4.41 MIL/uL (ref 3.87–5.11)
RDW: 13 % (ref 11.5–15.5)
WBC: 7.5 10*3/uL (ref 4.0–10.5)
nRBC: 0 % (ref 0.0–0.2)

## 2019-11-16 LAB — COMPREHENSIVE METABOLIC PANEL
ALT: 15 U/L (ref 0–44)
AST: 20 U/L (ref 15–41)
Albumin: 3.4 g/dL — ABNORMAL LOW (ref 3.5–5.0)
Alkaline Phosphatase: 79 U/L (ref 38–126)
Anion gap: 9 (ref 5–15)
BUN: 20 mg/dL (ref 8–23)
CO2: 26 mmol/L (ref 22–32)
Calcium: 9.3 mg/dL (ref 8.9–10.3)
Chloride: 103 mmol/L (ref 98–111)
Creatinine, Ser: 0.93 mg/dL (ref 0.44–1.00)
GFR calc Af Amer: >60 mL/min (ref >60)
GFR calc non Af Amer: 58 mL/min — ABNORMAL LOW (ref >60)
Glucose, Bld: 133 mg/dL — ABNORMAL HIGH (ref 70–99)
Potassium: 3.6 mmol/L (ref 3.5–5.1)
Sodium: 138 mmol/L (ref 135–145)
Total Bilirubin: 0.7 mg/dL (ref 0.3–1.2)
Total Protein: 7.1 g/dL (ref 6.5–8.1)

## 2019-11-16 LAB — URINALYSIS, ROUTINE W REFLEX MICROSCOPIC
Bacteria, UA: NONE SEEN
Bilirubin Urine: NEGATIVE
Glucose, UA: NEGATIVE mg/dL
Hgb urine dipstick: NEGATIVE
Ketones, ur: NEGATIVE mg/dL
Nitrite: NEGATIVE
Protein, ur: NEGATIVE mg/dL
Specific Gravity, Urine: 1.02 (ref 1.005–1.030)
pH: 5 (ref 5.0–8.0)

## 2019-11-16 LAB — SURGICAL PCR SCREEN
MRSA, PCR: NEGATIVE
Staphylococcus aureus: NEGATIVE

## 2019-11-16 LAB — APTT: aPTT: 28 seconds (ref 24–36)

## 2019-11-20 ENCOUNTER — Other Ambulatory Visit (HOSPITAL_COMMUNITY)
Admission: RE | Admit: 2019-11-20 | Discharge: 2019-11-20 | Disposition: A | Discharge status: Home / Self Care | Payer: Medicare Other | Source: Ambulatory Visit | Attending: Orthopedic Surgery | Admitting: Orthopedic Surgery

## 2019-11-20 DIAGNOSIS — Z01812 Encounter for preprocedural laboratory examination: Secondary | ICD-10-CM | POA: Insufficient documentation

## 2019-11-20 DIAGNOSIS — Z20822 Contact with and (suspected) exposure to covid-19: Secondary | ICD-10-CM | POA: Diagnosis not present

## 2019-11-20 LAB — SARS CORONAVIRUS 2 (TAT 6-24 HRS): SARS Coronavirus 2: NEGATIVE

## 2019-11-21 ENCOUNTER — Other Ambulatory Visit: Payer: Self-pay

## 2019-11-21 ENCOUNTER — Ambulatory Visit (INDEPENDENT_AMBULATORY_CARE_PROVIDER_SITE_OTHER): Payer: Medicare Other | Admitting: Family

## 2019-11-21 ENCOUNTER — Encounter: Payer: Self-pay | Admitting: Family

## 2019-11-21 VITALS — BP 102/61 | HR 92 | Temp 97.8°F | Ht 68.0 in | Wt 214.4 lb

## 2019-11-21 DIAGNOSIS — G8929 Other chronic pain: Secondary | ICD-10-CM

## 2019-11-21 DIAGNOSIS — Z01818 Encounter for other preprocedural examination: Secondary | ICD-10-CM | POA: Diagnosis not present

## 2019-11-21 DIAGNOSIS — M25511 Pain in right shoulder: Secondary | ICD-10-CM | POA: Diagnosis not present

## 2019-11-21 NOTE — Patient Instructions (Signed)
Reverse Total Shoulder Replacement, Care After This sheet gives you information about how to care for yourself after your procedure. Your health care provider may also give you more specific instructions. If you have problems or questions, contact your health care provider. What can I expect after the procedure? After the procedure, it is common to have:  Pain.  Stiffness. Follow these instructions at home: If you have a sling:  Wear the sling as told by your health care provider. Remove it only as told by your health care provider.  Loosen the sling if your fingers tingle, become numb, or turn cold and blue.  Keep the sling clean.  If the sling is not waterproof, do not let it get wet. Bathing  Do not take baths, swim, or use a hot tub until your health care provider approves. Ask your health care provider if you may take showers. You may only be allowed to take sponge baths for bathing.  If your sling is not waterproof, cover it with a watertight covering when you take a bath or shower.  Keep your bandage (dressing) dry until your health care provider says it can be removed. Incision care   Follow instructions from your health care provider about how to take care of your incision. Make sure you: ? Wash your hands with soap and water before you change your bandage (dressing). If soap and water are not available, use hand sanitizer. ? Change your dressing as told by your health care provider. ? Leave stitches (sutures), skin glue, or adhesive strips in place. These skin closures may need to stay in place for 2 weeks or longer. If adhesive strip edges start to loosen and curl up, you may trim the loose edges. Do not remove adhesive strips completely unless your health care provider tells you to do that.  Check your incision every day for signs of infection. Check for: ? More redness, swelling, or pain. ? More fluid or blood. ? Warmth. ? Pus or a bad smell. Driving  Ask your  health care provider when it is safe for you to drive.  Do not drive or use heavy machinery while taking prescription pain medicine.  Do not drive for 24 hours if you were given a medicine to help you relax (sedative). Activity  Return to your normal activities as told by your health care provider. Ask your health care provider what activities are safe for you.  Do shoulder exercises as told by your health care provider.  Do not lift your arm above shoulder level until your health care provider approves.  Do not make large arm movements.  Do not push or pull things until your health care provider approves.  Do not lift anything that is heavier than 5 lbs (2.3 kg) until your health care provider approves. Managing pain, stiffness, and swelling   If directed, put ice on your shoulder. ? Put ice in a plastic bag. ? Place a towel between your skin and the bag. ? Leave the ice on for 20 minutes, 2-3 times a day.  Move your fingers and hand often to avoid stiffness and to lessen swelling. General instructions  Do not use any products that contain nicotine or tobacco, such as cigarettes and e-cigarettes. These can delay bone healing. If you need help quitting, ask your health care provider.  To prevent or treat constipation while you are taking prescription pain medicine, your health care provider may recommend that you: ? Drink enough fluid to keep your  urine clear or pale yellow. ? Take over-the-counter or prescription medicines. ? Eat foods that are high in fiber, such as fresh fruits and vegetables, whole grains, and beans. ? Limit foods that are high in fat and processed sugars, such as fried and sweet foods.  Take over-the-counter and prescription medicines only as told by your health care provider.  Keep all follow-up visits as told by your health care provider. This is important. Contact a health care provider if:  You feel nauseous or you vomit.  You are constipated.  Constipation is when you have: ? Fewer bowel movements in a week than normal. ? Difficulty having a bowel movement. ? Stools that are dry, hard, or larger than normal.  Your arm tingles or feels numb.  Your pain gets worse, even after taking pain medicine.  You have more redness, swelling, or pain around your incision.  You have more fluid or blood coming from your incision.  Your incision feels warm to the touch.  You have pus or a bad smell coming from your incision.  You have a fever. Get help right away if:  Your shoulder joint moves out of place.  Your incision comes apart. This information is not intended to replace advice given to you by your health care provider. Make sure you discuss any questions you have with your health care provider. Document Revised: 03/05/2016 Document Reviewed: 01/14/2016 Elsevier Patient Education  2020 [employer].

## 2019-11-21 NOTE — Progress Notes (Signed)
   Subjective:    Patient ID: Mary Moss, female    DOB: 1936-06-02, 84 y.o.   MRN: [id number]  Chief Complaint  Patient presents with  . surgical clearance     HPI Pt presents to the office today for surgical clearance. She is scheduled for a right reverse total shoulder arthroplasty on 11/23/19.   She had labs, EKG, and chest x-ray on 11/16/19. These  were stable.   She reports her pain in her right shoulder of intermittent 8 out 10 when she moves her arm. She states her pain is a 0 out 10 when she does not move it.    Review of Systems  Musculoskeletal: Positive for arthralgias.  All other systems reviewed and are negative.      Objective:   Physical Exam Vitals reviewed.  Constitutional:      General: She is not in acute distress.    Appearance: She is well-developed.  HENT:     Head: Normocephalic and atraumatic.     Right Ear: Tympanic membrane normal.     Left Ear: Tympanic membrane normal.  Eyes:     Pupils: Pupils are equal, round, and reactive to light.  Neck:     Thyroid: No thyromegaly.  Cardiovascular:     Rate and Rhythm: Normal rate and regular rhythm.     Heart sounds: Normal heart sounds. No murmur.  Pulmonary:     Effort: Pulmonary effort is normal. No respiratory distress.     Breath sounds: Normal breath sounds. No wheezing.  Abdominal:     General: Bowel sounds are normal. There is no distension.     Palpations: Abdomen is soft.     Tenderness: There is no abdominal tenderness.  Musculoskeletal:        General: No tenderness.     Cervical back: Normal range of motion and neck supple.     Comments: Unable to lift right arm greater than 30 degrees   Skin:    General: Skin is warm and dry.  Neurological:     Mental Status: She is alert and oriented to person, place, and time.     Cranial Nerves: No cranial nerve deficit.     Deep Tendon Reflexes: Reflexes are normal and symmetric.  Psychiatric:        Behavior: Behavior normal.       Thought Content: Thought content normal.        Judgment: Judgment normal.     BP 102/61   Pulse 92   Temp 97.8 F (36.6 C) (Temporal)   Ht 5\' 8"  (1.727 m)   Wt 214 lb 6.4 oz (97.3 kg)   SpO2 94%   BMI 32.60 kg/m        Assessment & Plan:  [name] comes in today with chief complaint of surgical clearance    Diagnosis and orders addressed:  1. Chronic right shoulder pain  2. Pre-op exam  3. Morbid obesity ([place])  Labs, EKG, and chest x-ray reviewed Pt cleared for surgery Keep follow up with Ortho RTO as needed   Evelina Dun, FNP

## 2019-11-22 NOTE — Anesthesia Preprocedure Evaluation (Addendum)
Anesthesia Evaluation  Patient identified by MRN, date of birth, ID band Patient awake    Reviewed: Allergy & Precautions, NPO status , Patient's Chart, lab work & pertinent test results  Airway Mallampati: II  TM Distance: >3 FB Neck ROM: Full   Comment: Recessed chin  Dental no notable dental hx. (+) Caps, Teeth Intact, Dental Advisory Given   Pulmonary former smoker,  Former smoker, quit 2009, 20 pack year history    Pulmonary exam normal breath sounds clear to auscultation       Cardiovascular hypertension, Pt. on medications negative cardio ROS Normal cardiovascular exam Rhythm:Regular Rate:Normal     Neuro/Psych negative neurological ROS  negative psych ROS   GI/Hepatic Neg liver ROS, GERD  Medicated and Controlled,  Endo/Other  Obesity BMI 32  Renal/GU negative Renal ROS  negative genitourinary   Musculoskeletal  (+) Arthritis , Osteoarthritis,  Right shoulder rotator cuff tear    Abdominal Normal abdominal exam  (+)   Peds negative pediatric ROS (+)  Hematology negative hematology ROS (+)   Anesthesia Other Findings   Reproductive/Obstetrics negative OB ROS                           Anesthesia Physical Anesthesia Plan  ASA: III  Anesthesia Plan: General and Regional   Post-op Pain Management: GA combined w/ Regional for post-op pain   Induction: Intravenous  PONV Risk Score and Plan: 3 and Ondansetron, Dexamethasone and Treatment may vary due to age or medical condition  Airway Management Planned: Oral ETT  Additional Equipment: None  Intra-op Plan:   Post-operative Plan: Extubation in OR  Informed Consent: I have reviewed the patients History and Physical, chart, labs and discussed the procedure including the risks, benefits and alternatives for the proposed anesthesia with the patient or authorized representative who has indicated his/her understanding and  acceptance.     Dental advisory given  Plan Discussed with: CRNA  Anesthesia Plan Comments:         Anesthesia Quick Evaluation

## 2019-11-23 ENCOUNTER — Encounter (HOSPITAL_COMMUNITY): Payer: Self-pay | Admitting: Orthopedic Surgery

## 2019-11-23 ENCOUNTER — Other Ambulatory Visit: Payer: Self-pay

## 2019-11-23 ENCOUNTER — Ambulatory Visit (HOSPITAL_COMMUNITY): Payer: Medicare Other

## 2019-11-23 ENCOUNTER — Ambulatory Visit (HOSPITAL_COMMUNITY): Payer: Medicare Other | Admitting: Certified Registered Nurse Anesthetist

## 2019-11-23 ENCOUNTER — Encounter (HOSPITAL_COMMUNITY)
Admission: RE | Disposition: A | Discharge status: Home / Self Care | Payer: Self-pay | Source: Ambulatory Visit | Attending: Orthopedic Surgery

## 2019-11-23 ENCOUNTER — Other Ambulatory Visit: Payer: Self-pay | Admitting: Family

## 2019-11-23 ENCOUNTER — Observation Stay (HOSPITAL_COMMUNITY)
Admission: RE | Admit: 2019-11-23 | Discharge: 2019-11-24 | Disposition: A | Discharge status: Home / Self Care | Payer: Medicare Other | Source: Ambulatory Visit | Attending: Orthopedic Surgery | Admitting: Orthopedic Surgery

## 2019-11-23 ENCOUNTER — Ambulatory Visit (HOSPITAL_COMMUNITY): Payer: Medicare Other | Admitting: Physician Assistant

## 2019-11-23 DIAGNOSIS — Z87891 Personal history of nicotine dependence: Secondary | ICD-10-CM | POA: Diagnosis not present

## 2019-11-23 DIAGNOSIS — Z96662 Presence of left artificial ankle joint: Secondary | ICD-10-CM | POA: Diagnosis not present

## 2019-11-23 DIAGNOSIS — Z96651 Presence of right artificial knee joint: Secondary | ICD-10-CM | POA: Insufficient documentation

## 2019-11-23 DIAGNOSIS — Z881 Allergy status to other antibiotic agents status: Secondary | ICD-10-CM | POA: Diagnosis not present

## 2019-11-23 DIAGNOSIS — Z79899 Other long term (current) drug therapy: Secondary | ICD-10-CM | POA: Insufficient documentation

## 2019-11-23 DIAGNOSIS — K219 Gastro-esophageal reflux disease without esophagitis: Secondary | ICD-10-CM | POA: Insufficient documentation

## 2019-11-23 DIAGNOSIS — I10 Essential (primary) hypertension: Secondary | ICD-10-CM

## 2019-11-23 DIAGNOSIS — M1990 Unspecified osteoarthritis, unspecified site: Secondary | ICD-10-CM | POA: Insufficient documentation

## 2019-11-23 DIAGNOSIS — E669 Obesity, unspecified: Secondary | ICD-10-CM | POA: Insufficient documentation

## 2019-11-23 DIAGNOSIS — M75101 Unspecified rotator cuff tear or rupture of right shoulder, not specified as traumatic: Secondary | ICD-10-CM | POA: Diagnosis not present

## 2019-11-23 DIAGNOSIS — Z6832 Body mass index (BMI) 32.0-32.9, adult: Secondary | ICD-10-CM | POA: Insufficient documentation

## 2019-11-23 DIAGNOSIS — Z96611 Presence of right artificial shoulder joint: Secondary | ICD-10-CM

## 2019-11-23 DIAGNOSIS — Z85828 Personal history of other malignant neoplasm of skin: Secondary | ICD-10-CM | POA: Diagnosis not present

## 2019-11-23 DIAGNOSIS — Z888 Allergy status to other drugs, medicaments and biological substances status: Secondary | ICD-10-CM | POA: Diagnosis not present

## 2019-11-23 HISTORY — PX: TOTAL SHOULDER ARTHROPLASTY: SHX126

## 2019-11-23 LAB — TYPE AND SCREEN
ABO/RH(D): A POS
Antibody Screen: NEGATIVE

## 2019-11-23 SURGERY — ARTHROPLASTY, SHOULDER, TOTAL
Anesthesia: Regional | Site: Shoulder | Laterality: Right

## 2019-11-23 MED ORDER — FENTANYL CITRATE (PF) 250 MCG/5ML IJ SOLN
INTRAMUSCULAR | Status: AC
  Filled 2019-11-23: qty 5

## 2019-11-23 MED ORDER — ALUM & MAG HYDROXIDE-SIMETH 200-200-20 MG/5ML PO SUSP: 30.0000 mL | ORAL | Status: DC | PRN

## 2019-11-23 MED ORDER — HYDROCHLOROTHIAZIDE 25 MG PO TABS
25.0000 mg | ORAL_TABLET | Freq: Every day | ORAL | Status: DC
  Filled 2019-11-23: qty 1

## 2019-11-23 MED ORDER — METOCLOPRAMIDE HCL 5 MG PO TABS: 5.0000 mg | ORAL_TABLET | Freq: Three times a day (TID) | ORAL | Status: DC | PRN

## 2019-11-23 MED ORDER — PROPOFOL 10 MG/ML IV BOLUS
INTRAVENOUS | Status: AC
  Filled 2019-11-23: qty 20

## 2019-11-23 MED ORDER — LIDOCAINE 2% (20 MG/ML) 5 ML SYRINGE
INTRAMUSCULAR | Status: AC
  Filled 2019-11-23: qty 5

## 2019-11-23 MED ORDER — TRANEXAMIC ACID-NACL 1000-0.7 MG/100ML-% IV SOLN
1000.0000 mg | INTRAVENOUS | Status: AC
  Administered 2019-11-23: 1000 mg via INTRAVENOUS
  Filled 2019-11-23: qty 100

## 2019-11-23 MED ORDER — BISACODYL 5 MG PO TBEC: 5.0000 mg | DELAYED_RELEASE_TABLET | Freq: Every day | ORAL | Status: DC | PRN

## 2019-11-23 MED ORDER — POLYETHYLENE GLYCOL 3350 17 G PO PACK: 17.0000 g | PACK | Freq: Every day | ORAL | Status: DC | PRN

## 2019-11-23 MED ORDER — ZOLPIDEM TARTRATE 5 MG PO TABS: 5.0000 mg | ORAL_TABLET | Freq: Every evening | ORAL | Status: DC | PRN

## 2019-11-23 MED ORDER — PHENYLEPHRINE 40 MCG/ML (10ML) SYRINGE FOR IV PUSH (FOR BLOOD PRESSURE SUPPORT)
PREFILLED_SYRINGE | INTRAVENOUS | Status: DC | PRN
  Administered 2019-11-23: 60 ug via INTRAVENOUS
  Administered 2019-11-23 (×2): 80 ug via INTRAVENOUS

## 2019-11-23 MED ORDER — PHENYLEPHRINE HCL-NACL 10-0.9 MG/250ML-% IV SOLN
INTRAVENOUS | Status: DC | PRN
  Administered 2019-11-23: 35 ug/min via INTRAVENOUS

## 2019-11-23 MED ORDER — SODIUM CHLORIDE 0.9 % IV SOLN
INTRAVENOUS | Status: AC | PRN
  Administered 2019-11-23 (×2): 1000 mL

## 2019-11-23 MED ORDER — HYDROCODONE-ACETAMINOPHEN 7.5-325 MG PO TABS: 1.0000 | ORAL_TABLET | ORAL | Status: DC | PRN

## 2019-11-23 MED ORDER — SODIUM CHLORIDE 0.9 % IV SOLN: INTRAVENOUS | Status: DC

## 2019-11-23 MED ORDER — HYDROCODONE-ACETAMINOPHEN 5-325 MG PO TABS
1.0000 | ORAL_TABLET | ORAL | Status: DC | PRN
  Administered 2019-11-24: 2 via ORAL
  Administered 2019-11-24: 1 via ORAL
  Filled 2019-11-23: qty 2
  Filled 2019-11-23: qty 1

## 2019-11-23 MED ORDER — ASPIRIN EC 81 MG PO TBEC
81.0000 mg | DELAYED_RELEASE_TABLET | Freq: Two times a day (BID) | ORAL | Status: DC
  Administered 2019-11-23 – 2019-11-24 (×3): 81 mg via ORAL
  Filled 2019-11-23 (×3): qty 1

## 2019-11-23 MED ORDER — ROCURONIUM BROMIDE 50 MG/5ML IV SOSY
PREFILLED_SYRINGE | INTRAVENOUS | Status: DC | PRN
  Administered 2019-11-23: 50 mg via INTRAVENOUS

## 2019-11-23 MED ORDER — BUPIVACAINE LIPOSOME 1.3 % IJ SUSP
INTRAMUSCULAR | Status: DC | PRN
  Administered 2019-11-23: 10 mL via PERINEURAL

## 2019-11-23 MED ORDER — DEXAMETHASONE SODIUM PHOSPHATE 10 MG/ML IJ SOLN
INTRAMUSCULAR | Status: DC | PRN
  Administered 2019-11-23: 5 mg via INTRAVENOUS

## 2019-11-23 MED ORDER — 0.9 % SODIUM CHLORIDE (POUR BTL) OPTIME
TOPICAL | Status: DC | PRN
  Administered 2019-11-23: 1000 mL

## 2019-11-23 MED ORDER — PROMETHAZINE HCL 25 MG/ML IJ SOLN: 6.2500 mg | INTRAMUSCULAR | Status: DC | PRN

## 2019-11-23 MED ORDER — METHOCARBAMOL 500 MG PO TABS
500.0000 mg | ORAL_TABLET | Freq: Four times a day (QID) | ORAL | Status: DC | PRN
  Administered 2019-11-23 – 2019-11-24 (×2): 500 mg via ORAL
  Filled 2019-11-23 (×2): qty 1

## 2019-11-23 MED ORDER — ROCURONIUM BROMIDE 10 MG/ML (PF) SYRINGE
PREFILLED_SYRINGE | INTRAVENOUS | Status: AC
  Filled 2019-11-23: qty 10

## 2019-11-23 MED ORDER — FLEET ENEMA 7-19 GM/118ML RE ENEM: 1.0000 | ENEMA | Freq: Once | RECTAL | Status: DC | PRN

## 2019-11-23 MED ORDER — ONDANSETRON HCL 4 MG/2ML IJ SOLN: 4.0000 mg | Freq: Four times a day (QID) | INTRAMUSCULAR | Status: DC | PRN

## 2019-11-23 MED ORDER — SUGAMMADEX SODIUM 200 MG/2ML IV SOLN
INTRAVENOUS | Status: DC | PRN
  Administered 2019-11-23: 200 mg via INTRAVENOUS

## 2019-11-23 MED ORDER — ROPIVACAINE HCL 5 MG/ML IJ SOLN
INTRAMUSCULAR | Status: DC | PRN
  Administered 2019-11-23: 20 mL via PERINEURAL

## 2019-11-23 MED ORDER — ACETAMINOPHEN 500 MG PO TABS
1000.0000 mg | ORAL_TABLET | Freq: Once | ORAL | Status: AC
  Administered 2019-11-23: 1000 mg via ORAL
  Filled 2019-11-23: qty 2

## 2019-11-23 MED ORDER — LACTATED RINGERS IV SOLN: INTRAVENOUS | Status: DC

## 2019-11-23 MED ORDER — DOCUSATE SODIUM 100 MG PO CAPS
100.0000 mg | ORAL_CAPSULE | Freq: Two times a day (BID) | ORAL | Status: DC
  Administered 2019-11-23 – 2019-11-24 (×3): 100 mg via ORAL
  Filled 2019-11-23 (×3): qty 1

## 2019-11-23 MED ORDER — CEFAZOLIN SODIUM-DEXTROSE 2-4 GM/100ML-% IV SOLN
2.0000 g | Freq: Four times a day (QID) | INTRAVENOUS | Status: AC
  Administered 2019-11-23 – 2019-11-24 (×3): 2 g via INTRAVENOUS
  Filled 2019-11-23 (×4): qty 100

## 2019-11-23 MED ORDER — LIDOCAINE 2% (20 MG/ML) 5 ML SYRINGE
INTRAMUSCULAR | Status: DC | PRN
  Administered 2019-11-23: 20 mg via INTRAVENOUS

## 2019-11-23 MED ORDER — ONDANSETRON HCL 4 MG PO TABS: 4.0000 mg | ORAL_TABLET | Freq: Four times a day (QID) | ORAL | Status: DC | PRN

## 2019-11-23 MED ORDER — FENTANYL CITRATE (PF) 100 MCG/2ML IJ SOLN
25.0000 ug | INTRAMUSCULAR | Status: DC | PRN
  Administered 2019-11-23: 25 ug via INTRAVENOUS

## 2019-11-23 MED ORDER — DIPHENHYDRAMINE HCL 12.5 MG/5ML PO ELIX: 12.5000 mg | ORAL_SOLUTION | ORAL | Status: DC | PRN

## 2019-11-23 MED ORDER — METHOCARBAMOL 500 MG IVPB - SIMPLE MED
500.0000 mg | Freq: Four times a day (QID) | INTRAVENOUS | Status: DC | PRN
  Filled 2019-11-23: qty 50

## 2019-11-23 MED ORDER — PHENYLEPHRINE HCL (PRESSORS) 10 MG/ML IV SOLN
INTRAVENOUS | Status: AC
  Filled 2019-11-23: qty 1

## 2019-11-23 MED ORDER — ONDANSETRON HCL 4 MG/2ML IJ SOLN
INTRAMUSCULAR | Status: AC
  Filled 2019-11-23: qty 2

## 2019-11-23 MED ORDER — MORPHINE SULFATE (PF) 2 MG/ML IV SOLN: 0.5000 mg | INTRAVENOUS | Status: DC | PRN

## 2019-11-23 MED ORDER — MENTHOL 3 MG MT LOZG: 1.0000 | LOZENGE | OROMUCOSAL | Status: DC | PRN

## 2019-11-23 MED ORDER — WATER FOR IRRIGATION, STERILE IR SOLN
Status: DC | PRN
  Administered 2019-11-23: 2000 mL

## 2019-11-23 MED ORDER — LISINOPRIL 10 MG PO TABS
10.0000 mg | ORAL_TABLET | Freq: Every day | ORAL | Status: DC
  Filled 2019-11-23: qty 1

## 2019-11-23 MED ORDER — ACETAMINOPHEN 325 MG PO TABS: 325.0000 mg | ORAL_TABLET | Freq: Four times a day (QID) | ORAL | Status: DC | PRN

## 2019-11-23 MED ORDER — METOCLOPRAMIDE HCL 5 MG/ML IJ SOLN: 5.0000 mg | Freq: Three times a day (TID) | INTRAMUSCULAR | Status: DC | PRN

## 2019-11-23 MED ORDER — DEXAMETHASONE SODIUM PHOSPHATE 10 MG/ML IJ SOLN
INTRAMUSCULAR | Status: AC
  Filled 2019-11-23: qty 1

## 2019-11-23 MED ORDER — PHENOL 1.4 % MT LIQD
1.0000 | OROMUCOSAL | Status: DC | PRN
  Administered 2019-11-23: 1 via OROMUCOSAL
  Filled 2019-11-23: qty 177

## 2019-11-23 MED ORDER — FENTANYL CITRATE (PF) 100 MCG/2ML IJ SOLN
INTRAMUSCULAR | Status: DC | PRN
  Administered 2019-11-23 (×2): 50 ug via INTRAVENOUS

## 2019-11-23 MED ORDER — CEFAZOLIN SODIUM-DEXTROSE 2-4 GM/100ML-% IV SOLN
2.0000 g | INTRAVENOUS | Status: AC
  Administered 2019-11-23: 2 g via INTRAVENOUS
  Filled 2019-11-23: qty 100

## 2019-11-23 MED ORDER — PROPOFOL 10 MG/ML IV BOLUS
INTRAVENOUS | Status: DC | PRN
  Administered 2019-11-23: 30 mg via INTRAVENOUS
  Administered 2019-11-23: 100 mg via INTRAVENOUS

## 2019-11-23 MED ORDER — ONDANSETRON HCL 4 MG/2ML IJ SOLN
INTRAMUSCULAR | Status: DC | PRN
  Administered 2019-11-23: 4 mg via INTRAVENOUS

## 2019-11-23 MED ORDER — FENTANYL CITRATE (PF) 100 MCG/2ML IJ SOLN
INTRAMUSCULAR | Status: AC
  Administered 2019-11-23: 25 ug via INTRAVENOUS
  Filled 2019-11-23: qty 2

## 2019-11-23 SURGICAL SUPPLY — 82 items
AID PSTN UNV HD RSTRNT DISP (MISCELLANEOUS) ×1
BAG SPEC THK2 15X12 ZIP CLS (MISCELLANEOUS) ×1
BAG ZIPLOCK 12X15 (MISCELLANEOUS) ×3 IMPLANT
BASEPLATE P2 COATD GLND 6.5X30 (Shoulder) IMPLANT
BIT DRILL 1.6MX128 (BIT) ×2 IMPLANT
BIT DRILL 1.6MX128MM (BIT) ×1
BIT DRILL 2.5 DIA 127 CALI (BIT) ×2 IMPLANT
BIT DRILL 4 DIA CALIBRATED (BIT) ×3 IMPLANT
BLADE SAW SAG 73X25 THK (BLADE) ×2
BLADE SAW SGTL 73X25 THK (BLADE) ×1 IMPLANT
BSPLAT GLND 30 STRL LF SHLDR (Shoulder) ×1 IMPLANT
CLOSURE STERI-STRIP 1/2X4 (GAUZE/BANDAGES/DRESSINGS) ×1
CLOSURE WOUND 1/2 X4 (GAUZE/BANDAGES/DRESSINGS) ×1
CLSR STERI-STRIP ANTIMIC 1/2X4 (GAUZE/BANDAGES/DRESSINGS) ×2 IMPLANT
CNTNR URN SCR LID CUP LEK RST (MISCELLANEOUS) IMPLANT
CONT SPEC 4OZ STRL OR WHT (MISCELLANEOUS) ×3
COOLER ICEMAN CLASSIC (MISCELLANEOUS) ×2 IMPLANT
COVER BACK TABLE 60X90IN (DRAPES) ×3 IMPLANT
COVER SURGICAL LIGHT HANDLE (MISCELLANEOUS) ×3 IMPLANT
COVER WAND RF STERILE (DRAPES) ×2 IMPLANT
DRAPE INCISE IOBAN 66X45 STRL (DRAPES) ×3 IMPLANT
DRAPE ORTHO SPLIT 77X108 STRL (DRAPES)
DRAPE POUCH INSTRU U-SHP 10X18 (DRAPES) ×3 IMPLANT
DRAPE SHEET LG 3/4 BI-LAMINATE (DRAPES) ×6 IMPLANT
DRAPE SURG 17X11 SM STRL (DRAPES) ×3 IMPLANT
DRAPE SURG ORHT 6 SPLT 77X108 (DRAPES) IMPLANT
DRAPE U-SHAPE 47X51 STRL (DRAPES) ×3 IMPLANT
DRESSING AQUACEL AG SP 3.5X6 (GAUZE/BANDAGES/DRESSINGS) IMPLANT
DRSG AQUACEL AG ADV 3.5X 6 (GAUZE/BANDAGES/DRESSINGS) ×3 IMPLANT
DRSG AQUACEL AG SP 3.5X6 (GAUZE/BANDAGES/DRESSINGS) ×3
DURAPREP 26ML APPLICATOR (WOUND CARE) ×3 IMPLANT
ELECT BLADE TIP CTD 4 INCH (ELECTRODE) ×3 IMPLANT
ELECT REM PT RETURN 15FT ADLT (MISCELLANEOUS) ×3 IMPLANT
GLOVE BIO SURGEON STRL SZ7 (GLOVE) ×3 IMPLANT
GLOVE BIO SURGEON STRL SZ7.5 (GLOVE) ×3 IMPLANT
GLOVE BIOGEL PI IND STRL 7.0 (GLOVE) ×1 IMPLANT
GLOVE BIOGEL PI IND STRL 8 (GLOVE) ×1 IMPLANT
GLOVE BIOGEL PI INDICATOR 7.0 (GLOVE) ×2
GLOVE BIOGEL PI INDICATOR 8 (GLOVE) ×2
GOWN STRL REUS W/TWL LRG LVL3 (GOWN DISPOSABLE) ×6 IMPLANT
GOWN STRL REUS W/TWL XL LVL3 (GOWN DISPOSABLE) ×3 IMPLANT
HANDPIECE INTERPULSE COAX TIP (DISPOSABLE) ×3
HEMOSTAT SURGICEL 2X14 (HEMOSTASIS) ×3 IMPLANT
HOOD PEEL AWAY FLYTE STAYCOOL (MISCELLANEOUS) ×9 IMPLANT
HUMERA STEM SM SHELL SHOU 10 (Miscellaneous) ×3 IMPLANT
INSERT SMALL SOCKET 32MM NEU (Insert) ×3 IMPLANT
KIT BASIN (CUSTOM PROCEDURE TRAY) ×3 IMPLANT
KIT TURNOVER KIT A (KITS) IMPLANT
MANIFOLD NEPTUNE II (INSTRUMENTS) ×3 IMPLANT
NDL TROCAR POINT SZ 2 1/2 (NEEDLE) ×1 IMPLANT
NEEDLE TROCAR POINT SZ 2 1/2 (NEEDLE) ×3 IMPLANT
NS IRRIG 1000ML POUR BTL (IV SOLUTION) ×3 IMPLANT
P2 COATDE GLNOID BSEPLT 6.5X30 (Shoulder) ×3 IMPLANT
PACK SHOULDER (CUSTOM PROCEDURE TRAY) ×3 IMPLANT
PAD COLD SHLDR WRAP-ON (PAD) ×2 IMPLANT
PROTECTOR NERVE ULNAR (MISCELLANEOUS) IMPLANT
RESTRAINT HEAD UNIVERSAL NS (MISCELLANEOUS) ×3 IMPLANT
RETRIEVER SUT HEWSON (MISCELLANEOUS) ×3 IMPLANT
SCREW BONE LOCKING RSP 5.0X14 (Screw) ×6 IMPLANT
SCREW BONE LOCKING RSP 5.0X30 (Screw) ×6 IMPLANT
SCREW BONE RSP LOCK 5X14 (Screw) IMPLANT
SCREW BONE RSP LOCK 5X30 (Screw) IMPLANT
SCREW RETAIN W/HEAD 4MM OFFSET (Shoulder) ×3 IMPLANT
SET HNDPC FAN SPRY TIP SCT (DISPOSABLE) ×1 IMPLANT
SLING ARM IMMOBILIZER LRG (SOFTGOODS) ×6 IMPLANT
SMARTMIX MINI TOWER (MISCELLANEOUS) ×3
SPONGE LAP 18X18 RF (DISPOSABLE) ×3 IMPLANT
STEM HUMERAL SM SHELL SHOU 10 (Miscellaneous) ×1 IMPLANT
STRIP CLOSURE SKIN 1/2X4 (GAUZE/BANDAGES/DRESSINGS) ×2 IMPLANT
SUCTION FRAZIER HANDLE 12FR (TUBING) ×3
SUCTION TUBE FRAZIER 12FR DISP (TUBING) ×1 IMPLANT
SUPPORT WRAP ARM LG (MISCELLANEOUS) ×3 IMPLANT
SUT ETHIBOND 2 V 37 (SUTURE) ×3 IMPLANT
SUT MNCRL AB 4-0 PS2 18 (SUTURE) ×3 IMPLANT
SUT VIC AB 2-0 CT1 27 (SUTURE) ×3
SUT VIC AB 2-0 CT1 TAPERPNT 27 (SUTURE) ×1 IMPLANT
TAPE LABRALWHITE 1.5X36 (TAPE) ×3 IMPLANT
TAPE SUT LABRALTAP WHT/BLK (SUTURE) ×3 IMPLANT
TOWEL OR 17X26 10 PK STRL BLUE (TOWEL DISPOSABLE) ×3 IMPLANT
TOWER SMARTMIX MINI (MISCELLANEOUS) ×1 IMPLANT
WATER STERILE IRR 1000ML POUR (IV SOLUTION) ×3 IMPLANT
YANKAUER SUCT BULB TIP 10FT TU (MISCELLANEOUS) ×3 IMPLANT

## 2019-11-23 NOTE — Anesthesia Procedure Notes (Signed)
Anesthesia Regional Block: Interscalene brachial plexus block   Pre-Anesthetic Checklist: ,, timeout performed, Correct Patient, Correct Site, Correct Laterality, Correct Procedure, Correct Position, site marked, Risks and benefits discussed,  Surgical consent,  Pre-op evaluation,  At surgeon's request and post-op pain management  Laterality: Right  Prep: Maximum Sterile Barrier Precautions used, chloraprep       Needles:  Injection technique: Single-shot  Needle Type: Echogenic Stimulator Needle     Needle Length: 9cm  Needle Gauge: 22     Additional Needles:   Procedures:,,,, ultrasound used (permanent image in chart),,,,  Narrative:  Start time: 11/23/2019 6:50 AM End time: 11/23/2019 7:00 AM Injection made incrementally with aspirations every 5 mL.  Performed by: Personally  Anesthesiologist: Pervis Hocking, DO  Additional Notes: Monitors applied. No increased pain on injection. No increased resistance to injection. Injection made in 5cc increments. Good needle visualization. Patient tolerated procedure well.

## 2019-11-23 NOTE — Discharge Instructions (Signed)
Discharge Instructions after Reverse Total Shoulder Arthroplasty   . A sling has been provided for you. You are to wear this at all times (except for bathing and dressing), until your first post operative visit with Dr. Chandler. Please also wear while sleeping at night. While you bath and dress, let the arm/elbow extend straight down to stretch your elbow. Wiggle your fingers and pump your first while your in the sling to prevent hand swelling. . Use ice on the shoulder intermittently over the first 48 hours after surgery. Continue to use ice or and ice machine as needed after 48 hours for pain control/swelling.  . Pain medicine has been prescribed for you.  . Use your medicine liberally over the first 48 hours, and then you can begin to taper your use. You may take Extra Strength Tylenol or Tylenol only in place of the pain pills. DO NOT take ANY nonsteroidal anti-inflammatory pain medications: Advil, Motrin, Ibuprofen, Aleve, Naproxen or Naprosyn.  . Take one aspirin a day for 2 weeks after surgery, unless you have an aspirin sensitivity/allergy or asthma.  . Leave your dressing on until your first follow up visit.  You may shower with the dressing.  Hold your arm as if you still have your sling on while you shower. . Simply allow the water to wash over the site and then pat dry. Make sure your axilla (armpit) is completely dry after showering.    Please call [phone] during normal business hours or [phone] after hours for any problems. Including the following:  - excessive redness of the incisions - drainage for more than 4 days - fever of more than 101.5 F  *Please note that pain medications will not be refilled after hours or on weekends.     

## 2019-11-23 NOTE — Anesthesia Procedure Notes (Signed)
Procedure Name: Intubation Date/Time: 11/23/2019 7:29 AM Performed by: Maxwell Caul, CRNA Pre-anesthesia Checklist: Patient identified, Emergency Drugs available, Suction available and Patient being monitored Patient Re-evaluated:Patient Re-evaluated prior to induction Oxygen Delivery Method: Circle system utilized Preoxygenation: Pre-oxygenation with 100% oxygen Induction Type: IV induction Ventilation: Mask ventilation without difficulty and Oral airway inserted - appropriate to patient size Laryngoscope Size: Glidescope and 3 Grade View: Grade I Tube type: Oral Tube size: 7.5 mm Number of attempts: 1 Airway Equipment and Method: Stylet and Oral airway Placement Confirmation: ETT inserted through vocal cords under direct vision,  positive ETCO2 and breath sounds checked- equal and bilateral Secured at: 21 cm Tube secured with: Tape Dental Injury: Teeth and Oropharynx as per pre-operative assessment  Difficulty Due To: Difficulty was anticipated, Difficult Airway- due to limited oral opening and Difficult Airway- due to anterior larynx Comments: DL X 1 with MAC 4-grade 3 view-unable to place ETT. DL  X 1 with Glidescope 3-Grade 1 view and ETT gently placed.

## 2019-11-23 NOTE — Plan of Care (Signed)

## 2019-11-23 NOTE — Op Note (Signed)
Procedure(s): REVERSE TOTAL SHOULDER ARTHROPLASTY Procedure Note  Mary Moss female 84 y.o. 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 female  With endstage right shoulder arthritis with irrepairable rotator cuff tear. Pain and dysfunction interfered with quality of life and nonoperative treatment with activity modification, NSAIDS and injections failed.     Surgeon: Isabella Stalling   Assistants: Jeanmarie Hubert PA-C [facility] was present and scrubbed throughout the procedure and was essential in positioning, retraction, exposure, and closure)  Anesthesia: General endotracheal anesthesia with preoperative interscalene block given by attending anesthesiologist    Procedure Detail  REVERSE TOTAL SHOULDER ARTHROPLASTY   Estimated Blood Loss:  200 mL         Drains: none  Blood Given: none          Specimens: none        Complications:  * No complications entered in OR log *         Disposition: PACU - hemodynamically stable.         Condition: stable      OPERATIVE FINDINGS:  A DJO Altivate pressfit reverse total shoulder arthroplasty was placed with a  size 10 stem, a 32-4 glenosphere, and a standard poly insert. The base plate  fixation was excellent.  PROCEDURE: The patient was identified in the preoperative holding area  where I personally marked the operative site after verifying site, side,  and procedure with the patient. An interscalene block given by  the attending anesthesiologist in the holding area and the patient was taken back to the operating room where all extremities were  carefully padded in position after general anesthesia was induced. She  was placed in a beach-chair position and the operative upper extremity was   prepped and draped in a standard sterile fashion. An approximately 10-  cm incision was made from the tip of the coracoid process to the center  point of the humerus at the level of the axilla. Dissection was carried  down through subcutaneous tissues to the level of the cephalic vein  which was taken laterally with the deltoid. The pectoralis major was  retracted medially. The subdeltoid space was developed and the lateral  edge of the conjoined tendon was identified. The undersurface of  conjoined tendon was palpated and the musculocutaneous nerve was not in  the field. Retractor was placed underneath the conjoined and second  retractor was placed lateral into the deltoid. The circumflex humeral  artery and vessels were identified and clamped and coagulated. The  biceps tendon was tenotomized.  The subscapularis was taken down as a peel with the underlying capsule.  The  joint was then gently externally rotated while the capsule was released  from the humeral neck around to just beyond the 6 o'clock position. At  this point, the joint was dislocated and the humeral head was presented  into the wound. The excessive osteophyte formation was removed with a  large rongeur.  The cutting guide was used to make the appropriate  head cut and the head was saved for potentially bone grafting.  The glenoid was exposed with the arm in an  abducted extended position. The anterior and posterior labrum were  completely excised and the capsule was released circumferentially to  allow for exposure of the glenoid for preparation. The 2.5 mm  drill was  placed using the guide in 5-10 inferior angulation and the tap was then advanced in the same hole. Small and large reamers were then used. The tap was then removed and the Metaglene was then screwed in with excellent purchase.  The peripheral guide was then used to drilled measured and filled peripheral locking screws. The size 32-4 glenosphere was then  impacted on the [facility] taper and the central screw was placed. The humerus was then again exposed and the diaphyseal reamers were used followed by the metaphyseal reamers. The final broach was left in place in the proximal trial was placed. The joint was reduced and with this implant it was felt that soft tissue tensioning was appropriate with excellent stability and excellent range of motion. Therefore, final humeral stem was placed press-fit with a little bit of bone graft.  And then the trial polyethylene inserts were tested again and the above implant was felt to be the most appropriate for final insertion. The joint was reduced taken through full range of motion and felt to be stable. Soft tissue tension was appropriate.  The joint was then copiously irrigated with pulse  lavage and the wound was then closed. The subscapularis was repaired through bone tunnels with Ethibond.  Skin was closed with 2-0 Vicryl in a deep dermal layer and 4-0  Monocryl for skin closure. Steri-Strips were applied. Sterile  dressings were then applied as well as a sling. The patient was allowed  to awaken from general anesthesia, transferred to stretcher, and taken  to recovery room in stable condition.   POSTOPERATIVE PLAN: The patient will be kept overnight in the hospital postoperatively  for pain control and therapy.

## 2019-11-23 NOTE — H&P (Signed)
Mary Moss is an 84 y.o. female.   Chief Complaint: R shoulder pain and dysfunction HPI: R shoulder irrepairable rotator cuff tear with severe dysfunction and pain, failed conservative management.  Past Medical History:  Diagnosis Date  . Ankle fracture fx 13 yrs ago, anklw swells off and on, has cramps in ankle also   left, to seee dr hewitt about 02-16-14  . Arthritis   . Basal cell carcinoma 2015   generalized  . GERD (gastroesophageal reflux disease)    OTC med   pt. denies  . Hypertension   . Pneumonia     Past Surgical History:  Procedure Laterality Date  . ABDOMINAL HYSTERECTOMY    . EYE SURGERY Bilateral    bilaterla cataract extraction with IOL  . FRACTURE SURGERY Left 2001  . JOINT REPLACEMENT    . KNEE ARTHROSCOPY Right 02/07/2014   Procedure: RIGHT ARTHROSCOPY KNEE, WITH MEDIAL MENISCAL DEBRIDEMENT AND CHONDRAPLASTY;  Surgeon: Gearlean Alf, MD;  Location: [facility];  Service: Orthopedics;  Laterality: Right;  . ROTATOR CUFF REPAIR Right 2009  . TOTAL ANKLE ARTHROPLASTY Left 12/20/2014   Procedure: LEFT TOTAL ANKLE ARTHOPLASTY WITH PERCUTANEOUS ACHILLES TENDON LENGTHENING;  Surgeon: Wylene Simmer, MD;  Location: [place];  Service: Orthopedics;  Laterality: Left;  . TOTAL KNEE ARTHROPLASTY Right 06/08/2016   Procedure: RIGHT TOTAL KNEE ARTHROPLASTY;  Surgeon: Gaynelle Arabian, MD;  Location: [facility];  Service: Orthopedics;  Laterality: Right;    Family History  Problem Relation Age of Onset  . Cancer Mother        colon  . Cancer Father        lung  . Cancer Sister        lung cancer-nonsmoker  . Alzheimer's disease Brother 23   Social History:  reports that she quit smoking about 11 years ago. Her smoking use included cigarettes. She started smoking about 62 years ago. She has a 20.00 pack-year smoking history. She has never used smokeless tobacco. She reports previous alcohol use. She reports that she does not use drugs.  Allergies:  Allergies  Allergen  Reactions  . Statins     Simvastatin, crestor, livalo Not able to walk because so much muscle/joint pain  . Neosporin [Neomycin-Bacitracin Zn-Polymyx] Rash  . Polysporin [Bacitracin-Polymyxin B] Rash    Medications Prior to Admission  Medication Sig Dispense Refill  . Biotin w/ Vitamins C & E (HAIR/SKIN/NAILS PO) Take 3 tablets by mouth daily.    . Calcium-Phosphorus-Vitamin D (CALCIUM GUMMIES PO) Take 2 tablets by mouth daily.    . clobetasol cream (TEMOVATE) [id number] % Apply 1 application topically 2 (two) times daily as needed (vaginal irritation.).     Marland Kitchen D3-50 1.25 MG (50000 UT) capsule Take 50,000 Units by mouth every Sunday.     . hydrochlorothiazide (HYDRODIURIL) 25 MG tablet Take 1 tablet (25 mg total) by mouth daily. 30 tablet 3  . lisinopril (ZESTRIL) 10 MG tablet Take 1 tablet (10 mg total) by mouth daily. 90 tablet 0    No results found for this or any previous visit (from the past 48 hour(s)). No results found.  Review of Systems  All other systems reviewed and are negative.   Blood pressure 125/71, pulse 88, temperature 98 F (36.7 C), temperature source Oral, resp. rate 16, height 5\' 8"  (1.727 m), weight 97.3 kg, SpO2 96 %. Physical Exam  Constitutional: She is oriented to person, place, and time. She appears well-developed and well-nourished.  HENT:  Head: Atraumatic.  Eyes:  EOM are normal.  Cardiovascular: Intact distal pulses.  Respiratory: Effort normal.  Musculoskeletal:     Comments: R shoulder pain and weakness with RC testing.  Neurological: She is alert and oriented to person, place, and time.  Skin: Skin is warm and dry.  Psychiatric: She has a normal mood and affect.     Assessment/Plan R shoulder irrepairable rotator cuff tear with severe dysfunction and pain, failed conservative management. Plan R reverse TSA Risks / benefits of surgery discussed Consent on chart  NPO for OR Preop antibiotics   Isabella Stalling, MD 11/23/2019, 7:09 AM

## 2019-11-23 NOTE — Transfer of Care (Signed)
Immediate Anesthesia Transfer of Care Note  Patient: Mary Moss  Procedure(s) Performed: REVERSE TOTAL SHOULDER ARTHROPLASTY (Right Shoulder)  Patient Location: PACU  Anesthesia Type:General and Regional  Level of Consciousness: awake, alert  and oriented  Airway & Oxygen Therapy: Patient Spontanous Breathing and Patient connected to face mask oxygen  Post-op Assessment: Report given to RN and Post -op Vital signs reviewed and stable  Post vital signs: Reviewed and stable  Last Vitals:  Vitals Value Taken Time  BP 169/83 11/23/19 0915  Temp 36.4 C 11/23/19 0915  Pulse 102 11/23/19 0915  Resp 25 11/23/19 0915  SpO2 98 % 11/23/19 0915  Vitals shown include unvalidated device data.  Last Pain:  Vitals:   11/23/19 0628  TempSrc:   PainSc: 0-No pain         Complications: No apparent anesthesia complications

## 2019-11-23 NOTE — Anesthesia Postprocedure Evaluation (Signed)
Anesthesia Post Note  Patient: Mary Moss  Procedure(s) Performed: REVERSE TOTAL SHOULDER ARTHROPLASTY (Right Shoulder)     Patient location during evaluation: PACU Anesthesia Type: Regional and General Level of consciousness: awake and alert, oriented and patient cooperative Pain management: pain level controlled Vital Signs Assessment: post-procedure vital signs reviewed and stable Respiratory status: spontaneous breathing, nonlabored ventilation and respiratory function stable Cardiovascular status: blood pressure returned to baseline and stable Postop Assessment: no apparent nausea or vomiting Anesthetic complications: no    Last Vitals:  Vitals:   11/23/19 0559 11/23/19 0915  BP: 125/71 (!) 169/83  Pulse: 88 99  Resp: 16 (!) 23  Temp: 36.7 C 36.4 C  SpO2: 96% 98%    Last Pain:  Vitals:   11/23/19 0915  TempSrc:   PainSc: 0-No pain                 Pervis Hocking

## 2019-11-24 ENCOUNTER — Encounter: Payer: Self-pay | Admitting: *Deleted

## 2019-11-24 DIAGNOSIS — M75101 Unspecified rotator cuff tear or rupture of right shoulder, not specified as traumatic: Secondary | ICD-10-CM | POA: Diagnosis not present

## 2019-11-24 LAB — CBC
HCT: 33.3 % — ABNORMAL LOW (ref 36.0–46.0)
Hemoglobin: 10.5 g/dL — ABNORMAL LOW (ref 12.0–15.0)
MCH: 30.5 pg (ref 26.0–34.0)
MCHC: 31.5 g/dL (ref 30.0–36.0)
MCV: 96.8 fL (ref 80.0–100.0)
Platelets: 250 10*3/uL (ref 150–400)
RBC: 3.44 MIL/uL — ABNORMAL LOW (ref 3.87–5.11)
RDW: 13.2 % (ref 11.5–15.5)
WBC: 9.8 10*3/uL (ref 4.0–10.5)
nRBC: 0 % (ref 0.0–0.2)

## 2019-11-24 LAB — BASIC METABOLIC PANEL
Anion gap: 8 (ref 5–15)
BUN: 11 mg/dL (ref 8–23)
CO2: 26 mmol/L (ref 22–32)
Calcium: 8.6 mg/dL — ABNORMAL LOW (ref 8.9–10.3)
Chloride: 105 mmol/L (ref 98–111)
Creatinine, Ser: 0.78 mg/dL (ref 0.44–1.00)
GFR calc Af Amer: >60 mL/min (ref >60)
GFR calc non Af Amer: >60 mL/min (ref >60)
Glucose, Bld: 127 mg/dL — ABNORMAL HIGH (ref 70–99)
Potassium: 3.5 mmol/L (ref 3.5–5.1)
Sodium: 139 mmol/L (ref 135–145)

## 2019-11-24 MED ORDER — HYDROCODONE-ACETAMINOPHEN 5-325 MG PO TABS: 1.0000 | ORAL_TABLET | Freq: Four times a day (QID) | ORAL | 0 refills | Status: DC | PRN

## 2019-11-24 MED ORDER — TIZANIDINE HCL 4 MG PO TABS
4.0000 mg | ORAL_TABLET | Freq: Three times a day (TID) | ORAL | 1 refills | Status: DC | PRN
Start: 2019-11-24 — End: 2021-10-08

## 2019-11-24 NOTE — Evaluation (Signed)
Occupational Therapy Evaluation Patient Details Name: Mary Moss MRN: [id number] DOB: 05/28/1936 Today's Date: 11/24/2019    History of Present Illness Pt s/p R s/p reverse arthroplasty of right shoulder   Clinical Impression   Patient presents s/p reverse athroplasty of right shoulder with decreased ROM and strength of right shoulder, conservative shoulder precautions and complaints of pain limiting patient's ability to perform ADLs and functional tasks.  Therapist provided education and instruction to patient in regards to exercises, precautions, positioning, donning upper extremity clothing and bathing while maintaining shoulder precautions, ice and edema management and donning/doffing sling. Patient verbalized understanding and demonstrated as needed. Hand outs provided. Patient to follow up with MD for further therapy needs.     Follow Up Recommendations  Follow surgeon's recommendation for DC plan and follow-up therapies    Equipment Recommendations       Recommendations for Other Services       Precautions / Restrictions Precautions Precautions: None Required Braces or Orthoses: Sling Restrictions Weight Bearing Restrictions: Yes Other Position/Activity Restrictions: NO AROM of shoulder, NO PROM of shoulder, sling on at all times except with ADL/exercise, AROM of elbow, wrist and hand as tolerated.      Mobility Bed Mobility               General bed mobility comments: Min assist to transfer out of bed to the left. Patient reports she will be sleeping in recliner at home.  Transfers                 General transfer comment: Patient cga assist for all other transfers - just for safety.    Balance Overall balance assessment: No apparent balance deficits (not formally assessed)                                         ADL either performed or assessed with clinical judgement   ADL Overall ADL's : Needs  assistance/impaired Eating/Feeding: Set up   Grooming: Supervision/safety Grooming Details (indicate cue type and reason): Patient stood at sink and washed hands. Upper Body Bathing: Minimal assistance Upper Body Bathing Details (indicate cue type and reason): Education on how to perform upper body bathing with shoulder precautions. Lower Body Bathing: Supervison/ safety Lower Body Bathing Details (indicate cue type and reason): Educated to have supervision for safety on initial shower. Upper Body Dressing : Moderate assistance Upper Body Dressing Details (indicate cue type and reason): Mod assist to donn t- shirt. Therapist instructed on demonstrated on how to perform. Lower Body Dressing: Minimal assistance Lower Body Dressing Details (indicate cue type and reason): Min assist to pull pants up over left hip. Instruction on not perform internal rotation with RUE Toilet Transfer: Supervision/safety Toilet Transfer Details (indicate cue type and reason): patient performed toilet transfer with supervision. patient used left arm on grab bar for assistance. Reports having BSC at home to elevate toilet seat. Toileting- Clothing Manipulation and Hygiene: Supervision/safety Toileting - Clothing Manipulation Details (indicate cue type and reason): Patient able to get underwear up and down.     Functional mobility during ADLs: Min guard General ADL Comments: Min guard for safety due to recent surgery on o2 sats dropping. No loss of balance.     Vision   Vision Assessment?: No apparent visual deficits     Perception     Praxis      Pertinent Vitals/Pain Pain Assessment:  0-10 Pain Score: 5  Pain Location: R shoulder Pain Descriptors / Indicators: Aching Pain Intervention(s): Monitored during session;Limited activity within patient's tolerance     Hand Dominance Right   Extremity/Trunk Assessment Upper Extremity Assessment Upper Extremity Assessment: RUE deficits/detail RUE: Unable to  fully assess due to immobilization       Cervical / Trunk Assessment Cervical / Trunk Assessment: Normal   Communication Communication Communication: No difficulties   Cognition Arousal/Alertness: Awake/alert Behavior During Therapy: WFL for tasks assessed/performed Overall Cognitive Status: Within Functional Limits for tasks assessed                                     General Comments       Exercises General Exercises - Upper Extremity Elbow Extension: (Patient verbalized undersatnding of exercise.) Wrist Flexion: AROM Wrist Extension: AROM;10 reps Composite Extension: AROM;10 reps   Shoulder Instructions Shoulder Instructions Donning/doffing shirt without moving shoulder: Patient able to independently direct caregiver Method for sponge bathing under operated UE: Patient able to independently direct caregiver Donning/doffing sling/immobilizer: Patient able to independently direct caregiver Correct positioning of sling/immobilizer: Patient able to independently direct caregiver ROM for elbow, wrist and digits of operated UE: Independent Sling wearing schedule (on at all times/off for ADL's): Independent Proper positioning of operated UE when showering: Independent Dressing change: Independent Positioning of UE while sleeping: [place] expects to be discharged to:: Private residence Living Arrangements: Alone Available Help at Discharge: Available 24 hours/day(Has a cousin who will be staying with her 24/7 during recovery.) Type of Home: House Home Access: (ramped entrance.)     Home Layout: One level     Bathroom Shower/Tub: Occupational psychologist: Handicapped height     Home Equipment: Shower seat;Grab bars - tub/shower;Bedside commode          Prior Functioning/Environment Level of Independence: Independent                 OT Problem List: Decreased strength;Decreased range of  motion;Impaired UE functional use;Pain      OT Treatment/Interventions:      OT Goals(Current goals can be found in the care plan section)    OT Frequency:     Barriers to D/C:            Co-evaluation              AM-PAC OT "6 Clicks" Daily Activity     Outcome Measure Help from another person eating meals?: A Little Help from another person taking care of personal grooming?: A Little Help from another person toileting, which includes using toliet, bedpan, or urinal?: A Little Help from another person bathing (including washing, rinsing, drying)?: A Little Help from another person to put on and taking off regular upper body clothing?: A Lot Help from another person to put on and taking off regular lower body clothing?: A Little 6 Click Score: 17   End of Session Equipment Utilized During Treatment: Gait belt Nurse Communication: (Rn notified of o2 sat with ambulation. Notified RN patient good for discharge in regards to OT education.)  Activity Tolerance: Patient tolerated treatment well Patient left: in chair;with call bell/phone within reach  OT Visit Diagnosis: Muscle weakness (generalized) (M62.81);Pain Pain - Right/Left: Right Pain - part of body: Shoulder                Time: [id number] OT  Time Calculation (min): 44 min Charges:  OT General Charges $OT Visit: 1 Visit OT Evaluation $OT Eval Low Complexity: 1 Low $OT Eval Moderate Complexity: 1 Mod OT Treatments $Self Care/Home Management : 23-37 mins  Derl Barrow, OTR/L Acute Care Rehab Services  Office [phone]   Lenward Chancellor 11/24/2019, 12:29 PM

## 2019-11-24 NOTE — Plan of Care (Signed)
  Problem: Education: Goal: Knowledge of General Education information will improve Description: Including pain rating scale, medication(s)/side effects and non-pharmacologic comfort measures Outcome: Adequate for Discharge   Problem: Health Behavior/Discharge Planning: Goal: Ability to manage health-related needs will improve Outcome: Adequate for Discharge   Problem: Clinical Measurements: Goal: Ability to maintain clinical measurements within normal limits will improve Outcome: Adequate for Discharge Goal: Will remain free from infection Outcome: Adequate for Discharge Goal: Diagnostic test results will improve Outcome: Adequate for Discharge Goal: Respiratory complications will improve Outcome: Adequate for Discharge Goal: Cardiovascular complication will be avoided Outcome: Adequate for Discharge   Problem: Activity: Goal: Risk for activity intolerance will decrease Outcome: Adequate for Discharge   Problem: Nutrition: Goal: Adequate nutrition will be maintained Outcome: Adequate for Discharge   Problem: Coping: Goal: Level of anxiety will decrease Outcome: Adequate for Discharge   Problem: Elimination: Goal: Will not experience complications related to bowel motility Outcome: Adequate for Discharge Goal: Will not experience complications related to urinary retention Outcome: Adequate for Discharge   Problem: Pain Managment: Goal: General experience of comfort will improve Outcome: Adequate for Discharge   Problem: Safety: Goal: Ability to remain free from injury will improve Outcome: Adequate for Discharge   Problem: Skin Integrity: Goal: Risk for impaired skin integrity will decrease Outcome: Adequate for Discharge   Problem: Education: Goal: Knowledge of the prescribed therapeutic regimen will improve Outcome: Adequate for Discharge Goal: Understanding of activity limitations/precautions following surgery will improve Outcome: Adequate for  Discharge Goal: Individualized Educational Video(s) Outcome: Adequate for Discharge   Problem: Activity: Goal: Ability to tolerate increased activity will improve Outcome: Adequate for Discharge   Problem: Pain Management: Goal: Pain level will decrease with appropriate interventions Outcome: Adequate for Discharge   

## 2019-11-24 NOTE — Plan of Care (Signed)
  Problem: Education: Goal: Knowledge of General Education information will improve Description: Including pain rating scale, medication(s)/side effects and non-pharmacologic comfort measures Outcome: Progressing   Problem: Nutrition: Goal: Adequate nutrition will be maintained Outcome: Progressing   Problem: Coping: Goal: Level of anxiety will decrease Outcome: Progressing   Problem: Pain Managment: Goal: General experience of comfort will improve Outcome: Progressing   Problem: Education: Goal: Knowledge of the prescribed therapeutic regimen will improve Outcome: Progressing Goal: Understanding of activity limitations/precautions following surgery will improve Outcome: Progressing Goal: Individualized Educational Video(s) Outcome: Progressing

## 2019-11-24 NOTE — Discharge Summary (Signed)
Patient ID: Mary Moss MRN: [id number] DOB/AGE: 1935/08/10 84 y.o.  Admit date: 11/23/2019 Discharge date: 11/24/2019  Admission Diagnoses:  Active Problems:   Status post reverse arthroplasty of right shoulder   Discharge Diagnoses:  Same  Past Medical History:  Diagnosis Date  . Ankle fracture fx 13 yrs ago, anklw swells off and on, has cramps in ankle also   left, to seee dr hewitt about 02-16-14  . Arthritis   . Basal cell carcinoma 2015   generalized  . GERD (gastroesophageal reflux disease)    OTC med   pt. denies  . Hypertension   . Pneumonia     Surgeries: Procedure(s): REVERSE TOTAL SHOULDER ARTHROPLASTY on 11/23/2019   Consultants:   Discharged Condition: Improved  Hospital Course: DALASIA Moss is an 84 y.o. female who was admitted 11/23/2019 for operative treatment of right end stage rot cuff tear arthropathy. Patient has severe unremitting pain that affects sleep, daily activities, and work/hobbies. After pre-op clearance the patient was taken to the operating room on 11/23/2019 and underwent  Procedure(s): REVERSE TOTAL SHOULDER ARTHROPLASTY.    Patient was given perioperative antibiotics:  Anti-infectives (From admission, onward)   Start     Dose/Rate Route Frequency Ordered Stop   11/23/19 1400  ceFAZolin (ANCEF) IVPB 2g/100 mL premix     2 g 200 mL/hr over 30 Minutes Intravenous Every 6 hours 11/23/19 1054 11/24/19 0209   11/23/19 0615  ceFAZolin (ANCEF) IVPB 2g/100 mL premix     2 g 200 mL/hr over 30 Minutes Intravenous On call to O.R. 11/23/19 [id number] 11/23/19 0731       Patient was given sequential compression devices, early ambulation, and asa to prevent DVT.  Patient benefited maximally from hospital stay and there were no complications.    Recent vital signs:  Patient Vitals for the past 24 hrs:  BP Temp Temp src Pulse Resp SpO2  11/24/19 1100 -- -- -- -- -- 92 %  11/24/19 0924 (!) 104/47 97.7 F (36.5 C) Oral 85 16 94 %  11/24/19  0459 129/64 98.4 F (36.9 C) Oral 88 20 95 %  11/24/19 0211 136/68 97.6 F (36.4 C) Oral 89 16 99 %  11/23/19 2204 113/60 98.9 F (37.2 C) Oral 100 20 95 %  11/23/19 1446 136/68 97.6 F (36.4 C) -- 89 16 99 %  11/23/19 1332 118/70 (!) 97.5 F (36.4 C) Oral 82 16 100 %  11/23/19 1200 123/63 (!) 97.5 F (36.4 C) Oral 80 16 98 %     Recent laboratory studies:  Recent Labs    11/24/19 0322  WBC 9.8  HGB 10.5*  HCT 33.3*  PLT 250  NA 139  K 3.5  CL 105  CO2 26  BUN 11  CREATININE 0.78  GLUCOSE 127*  CALCIUM 8.6*     Discharge Medications:   Allergies as of 11/24/2019      Reactions   Statins    Simvastatin, crestor, livalo Not able to walk because so much muscle/joint pain   Neosporin [neomycin-bacitracin Zn-polymyx] Rash   Polysporin [bacitracin-polymyxin B] Rash      Medication List    TAKE these medications   CALCIUM GUMMIES PO Take 2 tablets by mouth daily.   clobetasol cream 0.05 % Commonly known as: TEMOVATE Apply 1 application topically 2 (two) times daily as needed (vaginal irritation.).   D3-50 1.25 MG (50000 UT) capsule Generic drug: Cholecalciferol Take 50,000 Units by mouth every Sunday.   HAIR/SKIN/NAILS PO Take 3  tablets by mouth daily.   hydrochlorothiazide 25 MG tablet Commonly known as: HYDRODIURIL TAKE 1 TABLET BY MOUTH EVERY DAY   HYDROcodone-acetaminophen 5-325 MG tablet Commonly known as: Norco Take 1 tablet by mouth every 6 (six) hours as needed for moderate pain.   lisinopril 10 MG tablet Commonly known as: ZESTRIL Take 1 tablet (10 mg total) by mouth daily.   tiZANidine 4 MG tablet Commonly known as: Zanaflex Take 1 tablet (4 mg total) by mouth every 8 (eight) hours as needed for muscle spasms.       Diagnostic Studies: DG Chest 2 View  Result Date: 11/17/2019 CLINICAL DATA:  84 y.o female. Pre op chest xray. No current chest complaints. Hx HTN EXAM: CHEST - 2 VIEW COMPARISON:  Chest radiograph 01/29/2014 FINDINGS:  Stable cardiomediastinal contours with normal heart size. Lungs are hyperexpanded. There is mild diffuse bilateral chronic bronchial thickening likely reflecting smoking history. No new focal opacity. No pneumothorax or pleural effusion. No acute finding in the visualized skeleton. IMPRESSION: No acute cardiopulmonary process. Electronically Signed   By: Audie Pinto M.D.   On: 11/17/2019 09:31   DG Shoulder Right Port  Result Date: 11/23/2019 CLINICAL DATA:  Status post total shoulder replacement EXAM: PORTABLE RIGHT SHOULDER: 2 V COMPARISON:  MR right shoulder September 26, 2019 FINDINGS: Oblique and Y scapular images obtained. Patient is status post total shoulder replacement with prosthetic components appearing well-seated. No acute fracture or dislocation. There is mild bony overgrowth along the medial acromion. Visualized right lung clear. IMPRESSION: Prosthetic components appear well seated. No fracture or dislocation evident. Electronically Signed   By: Lowella Grip III M.D.   On: 11/23/2019 09:39    Disposition: Discharge disposition: 01-Home or Self Care       Discharge Instructions    Call MD / Call 911   Complete by: As directed    If you experience chest pain or shortness of breath, CALL 911 and be transported to the hospital emergency room.  If you develope a fever above 101 F, pus (white drainage) or increased drainage or redness at the wound, or calf pain, call your surgeon's office.   Constipation Prevention   Complete by: As directed    Drink plenty of fluids.  Prune juice may be helpful.  You may use a stool softener, such as Colace (over the counter) 100 mg twice a day.  Use MiraLax (over the counter) for constipation as needed.   Diet - low sodium heart healthy   Complete by: As directed    Increase activity slowly as tolerated   Complete by: As directed       Follow-up Information    Tania Ade, MD. Schedule an appointment as soon as possible for a visit  in 2 weeks.   Specialty: Orthopedic Surgery Contact information: [address] [place] [address] [phone]            Signed: Grier Mitts 11/24/2019, 11:17 AM

## 2019-11-24 NOTE — Progress Notes (Signed)
   PATIENT ID: Mary Moss   1 Day Post-Op Procedure(s) (LRB): REVERSE TOTAL SHOULDER ARTHROPLASTY (Right)  Subjective: Doing well, no complaints. mininmal pain, some muscle spasm overnight improved w pain meds.   Objective:  Vitals:   11/24/19 0211 11/24/19 0459  BP: 136/68 129/64  Pulse: 89 88  Resp: 16 20  Temp: 97.6 F (36.4 C) 98.4 F (36.9 C)  SpO2: 99% 95%     R shoulder dressing c/d/i Wiggles fingers  Labs:  Recent Labs    11/24/19 0322  HGB 10.5*   Recent Labs    11/24/19 0322  WBC 9.8  RBC 3.44*  HCT 33.3*  PLT 250   Recent Labs    11/24/19 0322  NA 139  K 3.5  CL 105  CO2 26  BUN 11  CREATININE 0.78  GLUCOSE 127*  CALCIUM 8.6*    Assessment and Plan: 1 day s/p right reverse TSA ABLA- 10.5 today, expected po, asymptomatic Dc home when cleared by OT Fu w Dr. Tamera Punt in 2 weeks  VTE proph: asa, scds

## 2019-12-08 ENCOUNTER — Encounter: Payer: Self-pay | Admitting: Physical Therapy

## 2019-12-08 ENCOUNTER — Ambulatory Visit: Payer: Medicare Other | Attending: Orthopedic Surgery | Admitting: Physical Therapy

## 2019-12-08 ENCOUNTER — Other Ambulatory Visit: Payer: Self-pay

## 2019-12-08 DIAGNOSIS — M6281 Muscle weakness (generalized): Secondary | ICD-10-CM | POA: Insufficient documentation

## 2019-12-08 DIAGNOSIS — M25511 Pain in right shoulder: Secondary | ICD-10-CM | POA: Insufficient documentation

## 2019-12-08 DIAGNOSIS — M25611 Stiffness of right shoulder, not elsewhere classified: Secondary | ICD-10-CM | POA: Diagnosis present

## 2019-12-08 DIAGNOSIS — G8929 Other chronic pain: Secondary | ICD-10-CM | POA: Insufficient documentation

## 2019-12-08 NOTE — Therapy (Signed)
[facility] [place], [place], [address] Phone: [phone]   Fax:  [phone]  Physical Therapy Evaluation  Patient Details  Name: Mary Moss MRN: [id number] Date of Birth: 09-08-1935 Referring Provider (PT): Tania Ade MD   Encounter Date: 12/08/2019  PT End of Session - 12/08/19 1218    Visit Number  1    Number of Visits  12    Date for PT Re-Evaluation  03/07/20    Authorization Type  FOTO.    PT Start Time  0900    PT Stop Time  0946    PT Time Calculation (min)  46 min    Activity Tolerance  Patient tolerated treatment well    Behavior During Therapy  WFL for tasks assessed/performed       Past Medical History:  Diagnosis Date  . Ankle fracture fx 13 yrs ago, anklw swells off and on, has cramps in ankle also   left, to seee dr hewitt about 02-16-14  . Arthritis   . Basal cell carcinoma 2015   generalized  . GERD (gastroesophageal reflux disease)    OTC med   pt. denies  . Hypertension   . Pneumonia     Past Surgical History:  Procedure Laterality Date  . ABDOMINAL HYSTERECTOMY    . EYE SURGERY Bilateral    bilaterla cataract extraction with IOL  . FRACTURE SURGERY Left 2001  . JOINT REPLACEMENT    . KNEE ARTHROSCOPY Right 02/07/2014   Procedure: RIGHT ARTHROSCOPY KNEE, WITH MEDIAL MENISCAL DEBRIDEMENT AND CHONDRAPLASTY;  Surgeon: Gearlean Alf, MD;  Location: [facility];  Service: Orthopedics;  Laterality: Right;  . ROTATOR CUFF REPAIR Right 2009  . TOTAL ANKLE ARTHROPLASTY Left 12/20/2014   Procedure: LEFT TOTAL ANKLE ARTHOPLASTY WITH PERCUTANEOUS ACHILLES TENDON LENGTHENING;  Surgeon: Wylene Simmer, MD;  Location: [facility];  Service: Orthopedics;  Laterality: Left;  . TOTAL KNEE ARTHROPLASTY Right 06/08/2016   Procedure: RIGHT TOTAL KNEE ARTHROPLASTY;  Surgeon: Gaynelle Arabian, MD;  Location: [facility];  Service: Orthopedics;  Laterality: Right;  . TOTAL SHOULDER ARTHROPLASTY Right 11/23/2019   Procedure: REVERSE TOTAL SHOULDER ARTHROPLASTY;  Surgeon: Tania Ade, MD;  Location: [facility];  Service: Orthopedics;  Laterality: Right;    There were no vitals filed for this visit.   Subjective Assessment - 12/08/19 1050    Subjective  COVID-19 screen performed prior to patient entering clinic.  The patient presents to the clinic s/p right reverse shoulder performed on 11/23/19.  She is pleased thus far.  She has been out of her sling around her home and moving it gently as well.  Her current pain-level is a 6/10 and higher if "using it too much."  Rest and ice decrease her pain.    Pertinent History  right massive rotator cuff tear (irreparable), knee replacement, ankle replacement, HTN    Limitations  Lifting;House hold activities    Patient Stated Goals  Use right UE without much pain.    Currently in Pain?  Yes    Pain Score  6     Pain Location  Shoulder    Pain Orientation  Right    Pain Descriptors / Indicators  Sore    Pain Type  Surgical pain    Pain Onset  1 to 4 weeks ago    Pain Frequency  Constant    Aggravating Factors   See above.    Pain Relieving Factors  See above.  [facility] PT Assessment - 12/08/19 0001      Assessment   Medical Diagnosis  S/p right reverse shoulder.    Referring Provider (PT)  Tania Ade MD    Hand Dominance  Right    Next MD Visit  --   11/23/19 (surgery date).     Precautions   Precaution Comments  PER PROTOCOL.    Required Braces or Orthoses  Sling      Restrictions   Other Position/Activity Restrictions  NO RT UE WB.      Balance Screen   Has the patient fallen in the past 6 months  No    Has the patient had a decrease in activity level because of a fear of falling?   Yes    Is the patient reluctant to leave their home because of a fear of falling?   No      Home Environment   Living Environment  Private residence      Prior Function   Level of Independence  Independent      Observation/Other Assessments    Observations  Right shoulder incisional site appears to be healing well.    Focus on Therapeutic Outcomes (FOTO)   96% limitation.      Observation/Other Assessments-Edema    Edema  --   Min+/mod- right shoulder edema.     Posture/Postural Control   Posture Comments  Guarded.      PROM   PROM Assessment Site  Shoulder    Right/Left Shoulder  Right    Right Shoulder Flexion  78 Degrees    Right Shoulder External Rotation  17 Degrees    Right Shoulder Horizontal ABduction  47 Degrees      Palpation   Palpation comment  Diffuse right shoulder tenderness currently.      Ambulation/Gait   Gait Comments  WNL.                  Objective measurements completed on examination: See above findings.      [facility] PT Treatment/Exercise - 12/08/19 0001      Vasopneumatic   Number Minutes Vasopneumatic   15 minutes    Vasopnuematic Location   --   Right shoulder with pillow between elbow and thorax.   Vasopneumatic Pressure  Low      Manual Therapy   Manual Therapy  Passive ROM    Passive ROM  In supine:  Gentle PROM to patient's right shoulder x 8 minutes.                  PT Long Term Goals - 12/08/19 1227      PT LONG TERM GOAL #1   Title  Patient will be independent with HEP.    Time  6    Period  Weeks    Status  New      PT LONG TERM GOAL #2   Title  Patient will report ability to perform ADLs with right shoulder pain less than or equal to 4/10.    Time  6    Period  Weeks    Status  New      PT LONG TERM GOAL #3   Title  Patient will demonstrate 70= degrees of right shoulder ER AROM to improve ability to don/doff apparel.    Time  6    Period  Weeks    Status  New      PT LONG TERM GOAL #4   Title  Patient  will demonstrate 140+ degrees of right shoulder flexion AROM to improve ability to perform shoulder height activities.    Time  6    Period  Weeks    Status  New      PT LONG TERM GOAL #5   Title  Perform ADL's with pain not >  3/10.    Time  6    Period  Weeks    Status  New             Plan - 12/08/19 1220    Clinical Impression Statement  The patient presents to OPPT s/p right reverse total shoulder surgery performed on 11/23/19.  She is very pleased with her progress thus far.  She has an expected loss of right shoulder motion.  She has been compliant to sling usage in community.  Her current FOTO limitation score is 96%.  Her incisional site appears to be healing well.  Patient will benefit from skilled physical therapy intervention to address deficits and pain.    Personal Factors and Comorbidities  Age;Comorbidity 2    Comorbidities  right massive rotator cuff tear (irreparable), knee replacement, ankle replacement, HTN    Examination-Activity Limitations  Dressing;Hygiene/Grooming;Reach Overhead;Other    Examination-Participation Restrictions  Cleaning;Other    Stability/Clinical Decision Making  Stable/Uncomplicated    Clinical Decision Making  Low    Rehab Potential  Excellent    PT Frequency  2x / week    PT Duration  6 weeks    PT Treatment/Interventions  ADLs/Self Care Home Management;Cryotherapy;Electrical Stimulation;Iontophoresis 4mg /ml Dexamethasone;Moist Heat;Therapeutic exercise;Neuromuscular re-education;Manual techniques;Passive range of motion;Therapeutic activities;Patient/family education;Vasopneumatic Device    PT Next Visit Plan  Begin with gentle right shoulder PROM.  Progress per protocol.  Vasopneumatic on low with pillow between elbow and thorax.    PT Home Exercise Plan  see patient education section    Consulted and Agree with Plan of Care  Patient       Patient will benefit from skilled therapeutic intervention in order to improve the following deficits and impairments:  Decreased activity tolerance, Decreased strength, Decreased range of motion, Pain, Impaired UE functional use, Increased edema  Visit Diagnosis: Chronic right shoulder pain - Plan: PT plan of care  cert/re-cert  Stiffness of right shoulder, not elsewhere classified - Plan: PT plan of care cert/re-cert     Problem List Patient Active Problem List   Diagnosis Date Noted  . Status post reverse arthroplasty of right shoulder 11/23/2019  . Morbid obesity ([place]) 11/18/2018  . OA (osteoarthritis) of knee 06/08/2016  . Arthritis of knee, right 01/31/2016  . Hyperlipidemia 09/02/2015  . Pre-diabetes 07/12/2015  . Essential hypertension 07/03/2015  . Post-traumatic arthritis of ankle 12/20/2014  . Acute medial meniscal tear 02/06/2014  . Low bone mass 12/14/2012  . GAD (generalized anxiety disorder) 12/14/2012  . Vitamin D deficiency 12/14/2012    Jemimah Cressy, [place] MPT 12/08/2019, 12:30 PM  [facility] [address] [place], [place], [address] Phone: [phone]   Fax:  [phone]  Name: Mary Moss MRN: [id number] Date of Birth: 15-Aug-1935

## 2019-12-12 ENCOUNTER — Ambulatory Visit: Payer: Medicare Other | Admitting: Physical Therapy

## 2019-12-12 ENCOUNTER — Encounter: Payer: Self-pay | Admitting: Physical Therapy

## 2019-12-12 ENCOUNTER — Other Ambulatory Visit: Payer: Self-pay

## 2019-12-12 DIAGNOSIS — M25511 Pain in right shoulder: Secondary | ICD-10-CM | POA: Diagnosis not present

## 2019-12-12 DIAGNOSIS — M6281 Muscle weakness (generalized): Secondary | ICD-10-CM

## 2019-12-12 DIAGNOSIS — M25611 Stiffness of right shoulder, not elsewhere classified: Secondary | ICD-10-CM

## 2019-12-12 DIAGNOSIS — G8929 Other chronic pain: Secondary | ICD-10-CM

## 2019-12-12 NOTE — Therapy (Signed)
[facility] [place], [place], [address] Phone: [phone]   Fax:  [phone]  Physical Therapy Treatment  Patient Details  Name: Mary Moss MRN: [id number] Date of Birth: 1936-01-18 Referring Provider (PT): Tania Ade MD   Encounter Date: 12/12/2019  PT End of Session - 12/12/19 1302    Visit Number  2    Number of Visits  12    Date for PT Re-Evaluation  03/07/20    Authorization Type  FOTO.    PT Start Time  1302    PT Stop Time  1345    PT Time Calculation (min)  43 min    Activity Tolerance  Patient tolerated treatment well    Behavior During Therapy  WFL for tasks assessed/performed       Past Medical History:  Diagnosis Date  . Ankle fracture fx 13 yrs ago, anklw swells off and on, has cramps in ankle also   left, to seee dr hewitt about 02-16-14  . Arthritis   . Basal cell carcinoma 2015   generalized  . GERD (gastroesophageal reflux disease)    OTC med   pt. denies  . Hypertension   . Pneumonia     Past Surgical History:  Procedure Laterality Date  . ABDOMINAL HYSTERECTOMY    . EYE SURGERY Bilateral    bilaterla cataract extraction with IOL  . FRACTURE SURGERY Left 2001  . JOINT REPLACEMENT    . KNEE ARTHROSCOPY Right 02/07/2014   Procedure: RIGHT ARTHROSCOPY KNEE, WITH MEDIAL MENISCAL DEBRIDEMENT AND CHONDRAPLASTY;  Surgeon: Gearlean Alf, MD;  Location: [facility];  Service: Orthopedics;  Laterality: Right;  . ROTATOR CUFF REPAIR Right 2009  . TOTAL ANKLE ARTHROPLASTY Left 12/20/2014   Procedure: LEFT TOTAL ANKLE ARTHOPLASTY WITH PERCUTANEOUS ACHILLES TENDON LENGTHENING;  Surgeon: Wylene Simmer, MD;  Location: [place];  Service: Orthopedics;  Laterality: Left;  . TOTAL KNEE ARTHROPLASTY Right 06/08/2016   Procedure: RIGHT TOTAL KNEE ARTHROPLASTY;  Surgeon: Gaynelle Arabian, MD;  Location: [facility];  Service: Orthopedics;  Laterality: Right;  . TOTAL SHOULDER ARTHROPLASTY Right 11/23/2019   Procedure: REVERSE TOTAL SHOULDER ARTHROPLASTY;  Surgeon: Tania Ade, MD;  Location: [facility];  Service: Orthopedics;  Laterality: Right;    There were no vitals filed for this visit.  Subjective Assessment - 12/12/19 1301    Subjective  COVID 19 screening performed on patient upon arrival. Patient reports soreness of R shoulder.    Pertinent History  right massive rotator cuff tear (irreparable), knee replacement, ankle replacement, HTN    Limitations  Lifting;House hold activities    Diagnostic tests  MRI: massive tear of right rotator cuff    Patient Stated Goals  Use right UE without much pain.    Currently in Pain?  Yes    Pain Score  7     Pain Location  Shoulder    Pain Orientation  Right    Pain Descriptors / Indicators  Sore    Pain Type  Surgical pain    Pain Onset  1 to 4 weeks ago    Pain Frequency  Intermittent         [facility] PT Assessment - 12/12/19 0001      Assessment   Medical Diagnosis  S/p right reverse shoulder.    Referring Provider (PT)  Tania Ade MD    Onset Date/Surgical Date  11/23/19    Hand Dominance  Right    Next MD Visit  01/03/2020  Precautions   Precaution Comments  PER PROTOCOL.    Required Braces or Orthoses  Sling      Restrictions   Other Position/Activity Restrictions  NO RT UE WB.                    [facility] Adult PT Treatment/Exercise - 12/12/19 0001      Modalities   Modalities  Electrical Stimulation;Vasopneumatic      Acupuncturist Location  R shoulder    Electrical Stimulation Action  IFC    Electrical Stimulation Parameters  80-150 hz x15 min    Electrical Stimulation Goals  Pain;Edema      Vasopneumatic   Number Minutes Vasopneumatic   15 minutes    Vasopnuematic Location   Shoulder    Vasopneumatic Pressure  Low    Vasopneumatic Temperature   34 for edema      Manual Therapy   Manual Therapy  Passive ROM    Passive ROM  PROM of R shoulder into flexion,  abduct, ER with gentle holds at end range; intermittant oscillations provided to reduce pain and muscle guarding                  PT Long Term Goals - 12/08/19 1227      PT LONG TERM GOAL #1   Title  Patient will be independent with HEP.    Time  6    Period  Weeks    Status  New      PT LONG TERM GOAL #2   Title  Patient will report ability to perform ADLs with right shoulder pain less than or equal to 4/10.    Time  6    Period  Weeks    Status  New      PT LONG TERM GOAL #3   Title  Patient will demonstrate 70= degrees of right shoulder ER AROM to improve ability to don/doff apparel.    Time  6    Period  Weeks    Status  New      PT LONG TERM GOAL #4   Title  Patient will demonstrate 140+ degrees of right shoulder flexion AROM to improve ability to perform shoulder height activities.    Time  6    Period  Weeks    Status  New      PT LONG TERM GOAL #5   Title  Perform ADL's with pain not > 3/10.    Time  6    Period  Weeks    Status  New            Plan - 12/12/19 1337    Clinical Impression Statement  Patient presented in clinic with only intermittant R shoulder pain with movement. Patient able to tolerate PROM session fairly well although intermittant rest breaks and oscillations were provided during PROM to reduce pain and muscle guarding. Increased tone palpable in R deltoids upon assessment. Patient eager to progress and heal at this time. Normal modalities response noted following removal of the modalities. Patient reports only using sling in public at this time.    Personal Factors and Comorbidities  Age;Comorbidity 2    Comorbidities  right massive rotator cuff tear (irreparable), knee replacement, ankle replacement, HTN    Examination-Activity Limitations  Dressing;Hygiene/Grooming;Reach Overhead;Other    Examination-Participation Restrictions  Cleaning;Other    Stability/Clinical Decision Making  Stable/Uncomplicated    Rehab Potential   Excellent    PT  Frequency  2x / week    PT Duration  6 weeks    PT Treatment/Interventions  ADLs/Self Care Home Management;Cryotherapy;Electrical Stimulation;Iontophoresis 4mg /ml Dexamethasone;Moist Heat;Therapeutic exercise;Neuromuscular re-education;Manual techniques;Passive range of motion;Therapeutic activities;Patient/family education;Vasopneumatic Device    PT Next Visit Plan  Begin with gentle right shoulder PROM.  Progress per protocol.  Vasopneumatic on low with pillow between elbow and thorax.    PT Home Exercise Plan  see patient education section    Consulted and Agree with Plan of Care  Patient       Patient will benefit from skilled therapeutic intervention in order to improve the following deficits and impairments:  Decreased activity tolerance, Decreased strength, Decreased range of motion, Pain, Impaired UE functional use, Increased edema  Visit Diagnosis: Chronic right shoulder pain  Stiffness of right shoulder, not elsewhere classified  Muscle weakness (generalized)     Problem List Patient Active Problem List   Diagnosis Date Noted  . Status post reverse arthroplasty of right shoulder 11/23/2019  . Morbid obesity ([place]) 11/18/2018  . OA (osteoarthritis) of knee 06/08/2016  . Arthritis of knee, right 01/31/2016  . Hyperlipidemia 09/02/2015  . Pre-diabetes 07/12/2015  . Essential hypertension 07/03/2015  . Post-traumatic arthritis of ankle 12/20/2014  . Acute medial meniscal tear 02/06/2014  . Low bone mass 12/14/2012  . GAD (generalized anxiety disorder) 12/14/2012  . Vitamin D deficiency 12/14/2012    Standley Brooking, PTA 12/12/2019, 1:49 PM  [facility] [address] [place], [place], [address] Phone: [phone]   Fax:  [phone]  Name: Mary Moss MRN: [id number] Date of Birth: [birth year]

## 2019-12-14 ENCOUNTER — Other Ambulatory Visit: Payer: Self-pay

## 2019-12-14 ENCOUNTER — Ambulatory Visit: Payer: Medicare Other | Admitting: Physical Therapy

## 2019-12-14 DIAGNOSIS — G8929 Other chronic pain: Secondary | ICD-10-CM

## 2019-12-14 DIAGNOSIS — M6281 Muscle weakness (generalized): Secondary | ICD-10-CM

## 2019-12-14 DIAGNOSIS — M25611 Stiffness of right shoulder, not elsewhere classified: Secondary | ICD-10-CM

## 2019-12-14 DIAGNOSIS — M25511 Pain in right shoulder: Secondary | ICD-10-CM | POA: Diagnosis not present

## 2019-12-14 NOTE — Therapy (Signed)
[facility] [place], [place], [address] Phone: [phone]   Fax:  [phone]  Physical Therapy Treatment  Patient Details  Name: Mary Moss MRN: [id number] Date of Birth: 1935-12-07 Referring Provider (PT): Tania Ade MD   Encounter Date: 12/14/2019  PT End of Session - 12/14/19 1359    Visit Number  3    Number of Visits  12    Date for PT Re-Evaluation  03/07/20    Authorization Type  FOTO.    PT Start Time  0100    PT Stop Time  0151    PT Time Calculation (min)  51 min    Activity Tolerance  Patient tolerated treatment well    Behavior During Therapy  WFL for tasks assessed/performed       Past Medical History:  Diagnosis Date  . Ankle fracture fx 13 yrs ago, anklw swells off and on, has cramps in ankle also   left, to seee dr hewitt about 02-16-14  . Arthritis   . Basal cell carcinoma 2015   generalized  . GERD (gastroesophageal reflux disease)    OTC med   pt. denies  . Hypertension   . Pneumonia     Past Surgical History:  Procedure Laterality Date  . ABDOMINAL HYSTERECTOMY    . EYE SURGERY Bilateral    bilaterla cataract extraction with IOL  . FRACTURE SURGERY Left 2001  . JOINT REPLACEMENT    . KNEE ARTHROSCOPY Right 02/07/2014   Procedure: RIGHT ARTHROSCOPY KNEE, WITH MEDIAL MENISCAL DEBRIDEMENT AND CHONDRAPLASTY;  Surgeon: Gearlean Alf, MD;  Location: [facility];  Service: Orthopedics;  Laterality: Right;  . ROTATOR CUFF REPAIR Right 2009  . TOTAL ANKLE ARTHROPLASTY Left 12/20/2014   Procedure: LEFT TOTAL ANKLE ARTHOPLASTY WITH PERCUTANEOUS ACHILLES TENDON LENGTHENING;  Surgeon: Wylene Simmer, MD;  Location: [facility];  Service: Orthopedics;  Laterality: Left;  . TOTAL KNEE ARTHROPLASTY Right 06/08/2016   Procedure: RIGHT TOTAL KNEE ARTHROPLASTY;  Surgeon: Gaynelle Arabian, MD;  Location[facility];  Service: Orthopedics;  Laterality: Right;  . TOTAL SHOULDER ARTHROPLASTY Right 11/23/2019   Procedure: REVERSE TOTAL SHOULDER ARTHROPLASTY;  Surgeon: Tania Ade, MD;  Location: [facility];  Service: Orthopedics;  Laterality: Right;    There were no vitals filed for this visit.  Subjective Assessment - 12/14/19 1342    Subjective  COVID-19 screen performed prior to patient entering clinic.  No new complaints.    Pertinent History  right massive rotator cuff tear (irreparable), knee replacement, ankle replacement, HTN    Limitations  Lifting;House hold activities    Diagnostic tests  MRI: massive tear of right rotator cuff    Patient Stated Goals  Use right UE without much pain.    Currently in Pain?  Yes    Pain Score  7     Pain Location  Shoulder    Pain Orientation  Right    Pain Descriptors / Indicators  Sore    Pain Type  Surgical pain    Pain Onset  1 to 4 weeks ago                        [facility] Adult PT Treatment/Exercise - 12/14/19 0001      Electrical Stimulation   Electrical Stimulation Location  RT shoulder.    Electrical Stimulation Action  IFC    Electrical Stimulation Parameters  80-150 Hz x 15 minutes.    Electrical Stimulation Goals  Edema;Pain  Vasopneumatic   Number Minutes Vasopneumatic   15 minutes    Vasopnuematic Location   --   RT shoulder.   Vasopneumatic Pressure  Low      Manual Therapy   Manual Therapy  Passive ROM    Passive ROM  In supine:  PROM x 23 minutes to patient's right shoulder per protocol guidelines.                  PT Long Term Goals - 12/08/19 1227      PT LONG TERM GOAL #1   Title  Patient will be independent with HEP.    Time  6    Period  Weeks    Status  New      PT LONG TERM GOAL #2   Title  Patient will report ability to perform ADLs with right shoulder pain less than or equal to 4/10.    Time  6    Period  Weeks    Status  New      PT LONG TERM GOAL #3   Title  Patient will demonstrate 70= degrees of right shoulder ER AROM to improve ability to don/doff apparel.    Time   6    Period  Weeks    Status  New      PT LONG TERM GOAL #4   Title  Patient will demonstrate 140+ degrees of right shoulder flexion AROM to improve ability to perform shoulder height activities.    Time  6    Period  Weeks    Status  New      PT LONG TERM GOAL #5   Title  Perform ADL's with pain not > 3/10.    Time  6    Period  Weeks    Status  New            Plan - 12/14/19 1356    Clinical Impression Statement  Patient progressing very nicely per protocol.  She tolerated right shoulder PROM very well today.    Personal Factors and Comorbidities  Age;Comorbidity 2    Comorbidities  right massive rotator cuff tear (irreparable), knee replacement, ankle replacement, HTN    Examination-Activity Limitations  Dressing;Hygiene/Grooming;Reach Overhead;Other    Examination-Participation Restrictions  Cleaning;Other    Stability/Clinical Decision Making  Stable/Uncomplicated    Rehab Potential  Excellent    PT Frequency  2x / week    PT Duration  6 weeks    PT Treatment/Interventions  ADLs/Self Care Home Management;Cryotherapy;Electrical Stimulation;Iontophoresis 4mg /ml Dexamethasone;Moist Heat;Therapeutic exercise;Neuromuscular re-education;Manual techniques;Passive range of motion;Therapeutic activities;Patient/family education;Vasopneumatic Device    PT Next Visit Plan  Begin with gentle right shoulder PROM.  Progress per protocol.  Vasopneumatic on low with pillow between elbow and thorax.       Patient will benefit from skilled therapeutic intervention in order to improve the following deficits and impairments:  Decreased activity tolerance, Decreased strength, Decreased range of motion, Pain, Impaired UE functional use, Increased edema  Visit Diagnosis: Chronic right shoulder pain  Stiffness of right shoulder, not elsewhere classified  Muscle weakness (generalized)     Problem List Patient Active Problem List   Diagnosis Date Noted  . Status post reverse  arthroplasty of right shoulder 11/23/2019  . Morbid obesity ([place]) 11/18/2018  . OA (osteoarthritis) of knee 06/08/2016  . Arthritis of knee, right 01/31/2016  . Hyperlipidemia 09/02/2015  . Pre-diabetes 07/12/2015  . Essential hypertension 07/03/2015  . Post-traumatic arthritis of ankle 12/20/2014  . Acute medial  meniscal tear 02/06/2014  . Low bone mass 12/14/2012  . GAD (generalized anxiety disorder) 12/14/2012  . Vitamin D deficiency 12/14/2012    [place], [place] MPT 12/14/2019, 2:01 PM  [facility] [address] [place], [place], [address] Phone: [phone]   Fax:  [phone]  Name: Mary Moss MRN: [id number] Date of Birth: May 14, 1936

## 2019-12-18 ENCOUNTER — Encounter: Payer: Self-pay | Admitting: Physical Therapy

## 2019-12-18 ENCOUNTER — Other Ambulatory Visit: Payer: Self-pay

## 2019-12-18 ENCOUNTER — Ambulatory Visit: Payer: Medicare Other | Admitting: Physical Therapy

## 2019-12-18 DIAGNOSIS — M25511 Pain in right shoulder: Secondary | ICD-10-CM | POA: Diagnosis not present

## 2019-12-18 DIAGNOSIS — M25611 Stiffness of right shoulder, not elsewhere classified: Secondary | ICD-10-CM

## 2019-12-18 NOTE — Therapy (Signed)
[facility] [place], [place], [address] Phone: [phone]   Fax:  [phone]  Physical Therapy Treatment  Patient Details  Name: Mary Moss MRN: [id number] Date of Birth: 12-14-1935 Referring Provider (PT): Tania Ade MD   Encounter Date: 12/18/2019  PT End of Session - 12/18/19 1340    Visit Number  4    Number of Visits  12    Date for PT Re-Evaluation  03/07/20    Authorization Type  FOTO.    PT Start Time  1257    PT Stop Time  1341    PT Time Calculation (min)  44 min    Activity Tolerance  Patient tolerated treatment well    Behavior During Therapy  WFL for tasks assessed/performed       Past Medical History:  Diagnosis Date  . Ankle fracture fx 13 yrs ago, anklw swells off and on, has cramps in ankle also   left, to seee dr hewitt about 02-16-14  . Arthritis   . Basal cell carcinoma 2015   generalized  . GERD (gastroesophageal reflux disease)    OTC med   pt. denies  . Hypertension   . Pneumonia     Past Surgical History:  Procedure Laterality Date  . ABDOMINAL HYSTERECTOMY    . EYE SURGERY Bilateral    bilaterla cataract extraction with IOL  . FRACTURE SURGERY Left 2001  . JOINT REPLACEMENT    . KNEE ARTHROSCOPY Right 02/07/2014   Procedure: RIGHT ARTHROSCOPY KNEE, WITH MEDIAL MENISCAL DEBRIDEMENT AND CHONDRAPLASTY;  Surgeon: Gearlean Alf, MD;  Location: [facility];  Service: Orthopedics;  Laterality: Right;  . ROTATOR CUFF REPAIR Right 2009  . TOTAL ANKLE ARTHROPLASTY Left 12/20/2014   Procedure: LEFT TOTAL ANKLE ARTHOPLASTY WITH PERCUTANEOUS ACHILLES TENDON LENGTHENING;  Surgeon: Wylene Simmer, MD;  Location: [facility];  Service: Orthopedics;  Laterality: Left;  . TOTAL KNEE ARTHROPLASTY Right 06/08/2016   Procedure: RIGHT TOTAL KNEE ARTHROPLASTY;  Surgeon: Gaynelle Arabian, MD;  Location: [facility];  Service: Orthopedics;  Laterality: Right;  . TOTAL SHOULDER ARTHROPLASTY Right 11/23/2019    Procedure: REVERSE TOTAL SHOULDER ARTHROPLASTY;  Surgeon: Tania Ade, MD;  Location[facility];  Service: Orthopedics;  Laterality: Right;    There were no vitals filed for this visit.  Subjective Assessment - 12/18/19 1327    Subjective  COVID-19 screen performed prior to patient entering clinic.  About the same.    Pertinent History  right massive rotator cuff tear (irreparable), knee replacement, ankle replacement, HTN    Limitations  Lifting;House hold activities    Diagnostic tests  MRI: massive tear of right rotator cuff    Patient Stated Goals  Use right UE without much pain.    Currently in Pain?  Yes    Pain Score  7     Pain Location  Shoulder    Pain Orientation  Right    Pain Descriptors / Indicators  Sore    Pain Type  Surgical pain    Pain Onset  1 to 4 weeks ago                        [facility] Adult PT Treatment/Exercise - 12/18/19 0001      Modalities   Modalities  Electrical Stimulation;Vasopneumatic      Electrical Stimulation   Electrical Stimulation Location  RT    Electrical Stimulation Action  IFC    Electrical Stimulation Parameters  80-150 Hz  x 15 minutes.      Vasopneumatic   Number Minutes Vasopneumatic   15 minutes    Vasopnuematic Location   --   RT SHLD.   Vasopneumatic Pressure  Low      Manual Therapy   Manual Therapy  Passive ROM    Passive ROM  In supine:  PROM to patient's right shoulder per protocol guidelines x 23 minutes.                  PT Long Term Goals - 12/08/19 1227      PT LONG TERM GOAL #1   Title  Patient will be independent with HEP.    Time  6    Period  Weeks    Status  New      PT LONG TERM GOAL #2   Title  Patient will report ability to perform ADLs with right shoulder pain less than or equal to 4/10.    Time  6    Period  Weeks    Status  New      PT LONG TERM GOAL #3   Title  Patient will demonstrate 70= degrees of right shoulder ER AROM to improve ability to don/doff apparel.     Time  6    Period  Weeks    Status  New      PT LONG TERM GOAL #4   Title  Patient will demonstrate 140+ degrees of right shoulder flexion AROM to improve ability to perform shoulder height activities.    Time  6    Period  Weeks    Status  New      PT LONG TERM GOAL #5   Title  Perform ADL's with pain not > 3/10.    Time  6    Period  Weeks    Status  New            Plan - 12/18/19 1339    Clinical Impression Statement  Continuing with right shoulder PROM.  Patient progressing well.  Reviewed supine cane exercise to increase ER.  Patient needs to do more of this at home.    Personal Factors and Comorbidities  Age;Comorbidity 2    Comorbidities  right massive rotator cuff tear (irreparable), knee replacement, ankle replacement, HTN    Examination-Activity Limitations  Dressing;Hygiene/Grooming;Reach Overhead;Other    Examination-Participation Restrictions  Cleaning;Other    Stability/Clinical Decision Making  Stable/Uncomplicated    Rehab Potential  Excellent    PT Frequency  2x / week    PT Duration  6 weeks    PT Treatment/Interventions  ADLs/Self Care Home Management;Cryotherapy;Electrical Stimulation;Iontophoresis 4mg /ml Dexamethasone;Moist Heat;Therapeutic exercise;Neuromuscular re-education;Manual techniques;Passive range of motion;Therapeutic activities;Patient/family education;Vasopneumatic Device    PT Next Visit Plan  Begin with gentle right shoulder PROM.  Progress per protocol.  Vasopneumatic on low with pillow between elbow and thorax.    PT Home Exercise Plan  see patient education section    Consulted and Agree with Plan of Care  Patient       Patient will benefit from skilled therapeutic intervention in order to improve the following deficits and impairments:  Decreased activity tolerance, Decreased strength, Decreased range of motion, Pain, Impaired UE functional use, Increased edema  Visit Diagnosis: Chronic right shoulder pain  Stiffness of right  shoulder, not elsewhere classified     Problem List Patient Active Problem List   Diagnosis Date Noted  . Status post reverse arthroplasty of right shoulder 11/23/2019  . Morbid obesity (  [place]) 11/18/2018  . OA (osteoarthritis) of knee 06/08/2016  . Arthritis of knee, right 01/31/2016  . Hyperlipidemia 09/02/2015  . Pre-diabetes 07/12/2015  . Essential hypertension 07/03/2015  . Post-traumatic arthritis of ankle 12/20/2014  . Acute medial meniscal tear 02/06/2014  . Low bone mass 12/14/2012  . GAD (generalized anxiety disorder) 12/14/2012  . Vitamin D deficiency 12/14/2012    Jamy Whyte, [place] MPT 12/18/2019, 1:42 PM  [facility] [address] [place], [place], [address] Phone: [phone]   Fax:  [phone]  Name: Mary Moss MRN: [id number] Date of Birth: Jun 03, 1936

## 2019-12-21 ENCOUNTER — Other Ambulatory Visit: Payer: Self-pay

## 2019-12-21 ENCOUNTER — Ambulatory Visit: Payer: Medicare Other | Admitting: Physical Therapy

## 2019-12-21 ENCOUNTER — Encounter: Payer: Self-pay | Admitting: Physical Therapy

## 2019-12-21 DIAGNOSIS — M25511 Pain in right shoulder: Secondary | ICD-10-CM | POA: Diagnosis not present

## 2019-12-21 DIAGNOSIS — M6281 Muscle weakness (generalized): Secondary | ICD-10-CM

## 2019-12-21 DIAGNOSIS — M25611 Stiffness of right shoulder, not elsewhere classified: Secondary | ICD-10-CM

## 2019-12-21 DIAGNOSIS — G8929 Other chronic pain: Secondary | ICD-10-CM

## 2019-12-21 NOTE — Therapy (Signed)
[facility] [place], [place], [address] Phone: [phone]   Fax:  [phone]  Physical Therapy Treatment  Patient Details  Name: Mary Moss MRN: [id number] Date of Birth: 1936/03/25 Referring Provider (PT): Tania Ade MD   Encounter Date: 12/21/2019  PT End of Session - 12/21/19 0949    Visit Number  5    Number of Visits  12    Date for PT Re-Evaluation  03/07/20    Authorization Type  FOTO.    PT Start Time  [phone]    PT Stop Time  1027    PT Time Calculation (min)  37 min    Activity Tolerance  Patient tolerated treatment well    Behavior During Therapy  WFL for tasks assessed/performed       Past Medical History:  Diagnosis Date  . Ankle fracture fx 13 yrs ago, anklw swells off and on, has cramps in ankle also   left, to seee dr hewitt about 02-16-14  . Arthritis   . Basal cell carcinoma 2015   generalized  . GERD (gastroesophageal reflux disease)    OTC med   pt. denies  . Hypertension   . Pneumonia     Past Surgical History:  Procedure Laterality Date  . ABDOMINAL HYSTERECTOMY    . EYE SURGERY Bilateral    bilaterla cataract extraction with IOL  . FRACTURE SURGERY Left 2001  . JOINT REPLACEMENT    . KNEE ARTHROSCOPY Right 02/07/2014   Procedure: RIGHT ARTHROSCOPY KNEE, WITH MEDIAL MENISCAL DEBRIDEMENT AND CHONDRAPLASTY;  Surgeon: Gearlean Alf, MD;  Location: [facility];  Service: Orthopedics;  Laterality: Right;  . ROTATOR CUFF REPAIR Right 2009  . TOTAL ANKLE ARTHROPLASTY Left 12/20/2014   Procedure: LEFT TOTAL ANKLE ARTHOPLASTY WITH PERCUTANEOUS ACHILLES TENDON LENGTHENING;  Surgeon: Wylene Simmer, MD;  Location: [place];  Service: Orthopedics;  Laterality: Left;  . TOTAL KNEE ARTHROPLASTY Right 06/08/2016   Procedure: RIGHT TOTAL KNEE ARTHROPLASTY;  Surgeon: Gaynelle Arabian, MD;  Location: [facility];  Service: Orthopedics;  Laterality: Right;  . TOTAL SHOULDER ARTHROPLASTY Right 11/23/2019   Procedure: REVERSE TOTAL SHOULDER ARTHROPLASTY;  Surgeon: Tania Ade, MD;  Location: [facility];  Service: Orthopedics;  Laterality: Right;    There were no vitals filed for this visit.  Subjective Assessment - 12/21/19 0948    Subjective  COVID-19 screen performed prior to patient entering clinic.  Only reports pain with intermittant movements. Reports she really worked it yesterday with exercises.    Pertinent History  right massive rotator cuff tear (irreparable), knee replacement, ankle replacement, HTN    Limitations  Lifting;House hold activities    Diagnostic tests  MRI: massive tear of right rotator cuff    Patient Stated Goals  Use right UE without much pain.    Currently in Pain?  No/denies         [facility] PT Assessment - 12/21/19 0001      Assessment   Medical Diagnosis  S/p right reverse shoulder.    Referring Provider (PT)  Tania Ade MD    Onset Date/Surgical Date  11/23/19    Hand Dominance  Right    Next MD Visit  01/03/2020      Precautions   Precaution Comments  PER PROTOCOL.    Required Braces or Orthoses  Sling      Observation/Other Assessments   Focus on Therapeutic Outcomes (FOTO)   18%, CI  [place] Adult PT Treatment/Exercise - 12/21/19 0001      Modalities   Modalities  Vasopneumatic      Vasopneumatic   Number Minutes Vasopneumatic   10 minutes    Vasopnuematic Location   Shoulder    Vasopneumatic Pressure  Low    Vasopneumatic Temperature   34      Manual Therapy   Manual Therapy  Passive ROM    Passive ROM  PROM of R shoulder into flexion, abduction, ER, IR with gentle holds at end range; intermittant oscillations provided to reduce muscle guarding                  PT Long Term Goals - 12/21/19 1035      PT LONG TERM GOAL #1   Title  Patient will be independent with HEP.    Time  6    Period  Weeks    Status  Achieved      PT LONG TERM GOAL #2   Title  Patient will report ability to  perform ADLs with right shoulder pain less than or equal to 4/10.    Time  6    Period  Weeks    Status  On-going      PT LONG TERM GOAL #3   Title  Patient will demonstrate 70= degrees of right shoulder ER AROM to improve ability to don/doff apparel.    Time  6    Period  Weeks    Status  On-going      PT LONG TERM GOAL #4   Title  Patient will demonstrate 140+ degrees of right shoulder flexion AROM to improve ability to perform shoulder height activities.    Time  6    Period  Weeks    Status  On-going      PT LONG TERM GOAL #5   Title  Perform ADL's with pain not > 3/10.    Time  6    Period  Weeks    Status  On-going            Plan - 12/21/19 1035    Clinical Impression Statement  Patient presented in clinic with reports of only intermittant pain with movements such as ER. Reports she is compliant with HEP and can assist ER and flexion with LUE. Intermittant reports of end range discomfort with end range flexion and ER. More tightness of PROM end range flexion today. Firm end feels and smooth arc of motion noted during PROM of R shoulder. Normal vasopnuematic response noted following removal of the modality.    Personal Factors and Comorbidities  Age;Comorbidity 2    Comorbidities  right massive rotator cuff tear (irreparable), knee replacement, ankle replacement, HTN    Examination-Activity Limitations  Dressing;Hygiene/Grooming;Reach Overhead;Other    Examination-Participation Restrictions  Cleaning;Other    Stability/Clinical Decision Making  Stable/Uncomplicated    Rehab Potential  Excellent    PT Frequency  2x / week    PT Duration  6 weeks    PT Treatment/Interventions  ADLs/Self Care Home Management;Cryotherapy;Electrical Stimulation;Iontophoresis 4mg /ml Dexamethasone;Moist Heat;Therapeutic exercise;Neuromuscular re-education;Manual techniques;Passive range of motion;Therapeutic activities;Patient/family education;Vasopneumatic Device    PT Next Visit Plan  Begin  with gentle right shoulder PROM.  Progress per protocol.  Vasopneumatic on low with pillow between elbow and thorax.    PT Home Exercise Plan  see patient education section    Consulted and Agree with Plan of Care  Patient       Patient will benefit from  skilled therapeutic intervention in order to improve the following deficits and impairments:  Decreased activity tolerance, Decreased strength, Decreased range of motion, Pain, Impaired UE functional use, Increased edema  Visit Diagnosis: Chronic right shoulder pain  Stiffness of right shoulder, not elsewhere classified  Muscle weakness (generalized)     Problem List Patient Active Problem List   Diagnosis Date Noted  . Status post reverse arthroplasty of right shoulder 11/23/2019  . Morbid obesity ([place]) 11/18/2018  . OA (osteoarthritis) of knee 06/08/2016  . Arthritis of knee, right 01/31/2016  . Hyperlipidemia 09/02/2015  . Pre-diabetes 07/12/2015  . Essential hypertension 07/03/2015  . Post-traumatic arthritis of ankle 12/20/2014  . Acute medial meniscal tear 02/06/2014  . Low bone mass 12/14/2012  . GAD (generalized anxiety disorder) 12/14/2012  . Vitamin D deficiency 12/14/2012    Standley Brooking, PTA 12/21/2019, 10:46 AM  [facility] [address] [place], [place], [address] Phone: [phone]   Fax:  [phone]  Name: CHANDA MINKUS MRN: [id number] Date of Birth: September 18, 1935

## 2019-12-26 ENCOUNTER — Ambulatory Visit: Payer: Medicare Other | Attending: Orthopedic Surgery | Admitting: Physical Therapy

## 2019-12-26 ENCOUNTER — Encounter: Payer: Self-pay | Admitting: Physical Therapy

## 2019-12-26 ENCOUNTER — Other Ambulatory Visit: Payer: Self-pay

## 2019-12-26 DIAGNOSIS — M25511 Pain in right shoulder: Secondary | ICD-10-CM | POA: Insufficient documentation

## 2019-12-26 DIAGNOSIS — R293 Abnormal posture: Secondary | ICD-10-CM | POA: Diagnosis present

## 2019-12-26 DIAGNOSIS — M25611 Stiffness of right shoulder, not elsewhere classified: Secondary | ICD-10-CM | POA: Insufficient documentation

## 2019-12-26 DIAGNOSIS — G8929 Other chronic pain: Secondary | ICD-10-CM | POA: Diagnosis present

## 2019-12-26 DIAGNOSIS — M6281 Muscle weakness (generalized): Secondary | ICD-10-CM | POA: Diagnosis present

## 2019-12-26 DIAGNOSIS — M5442 Lumbago with sciatica, left side: Secondary | ICD-10-CM | POA: Diagnosis present

## 2019-12-26 NOTE — Therapy (Signed)
[facility] [place], [place], [address] Phone: [phone]   Fax:  [phone]  Physical Therapy Treatment  Patient Details  Name: Mary Moss MRN: [id number] Date of Birth: September 17, 1935 Referring Provider (PT): Tania Ade MD   Encounter Date: 12/26/2019  PT End of Session - 12/26/19 0814    Visit Number  6    Number of Visits  12    Date for PT Re-Evaluation  03/07/20    Authorization Type  FOTO.    PT Start Time  0818    PT Stop Time  0855    PT Time Calculation (min)  37 min    Activity Tolerance  Patient tolerated treatment well    Behavior During Therapy  WFL for tasks assessed/performed       Past Medical History:  Diagnosis Date  . Ankle fracture fx 13 yrs ago, anklw swells off and on, has cramps in ankle also   left, to seee dr hewitt about 02-16-14  . Arthritis   . Basal cell carcinoma 2015   generalized  . GERD (gastroesophageal reflux disease)    OTC med   pt. denies  . Hypertension   . Pneumonia     Past Surgical History:  Procedure Laterality Date  . ABDOMINAL HYSTERECTOMY    . EYE SURGERY Bilateral    bilaterla cataract extraction with IOL  . FRACTURE SURGERY Left 2001  . JOINT REPLACEMENT    . KNEE ARTHROSCOPY Right 02/07/2014   Procedure: RIGHT ARTHROSCOPY KNEE, WITH MEDIAL MENISCAL DEBRIDEMENT AND CHONDRAPLASTY;  Surgeon: Gearlean Alf, MD;  Location: [facility];  Service: Orthopedics;  Laterality: Right;  . ROTATOR CUFF REPAIR Right 2009  . TOTAL ANKLE ARTHROPLASTY Left 12/20/2014   Procedure: LEFT TOTAL ANKLE ARTHOPLASTY WITH PERCUTANEOUS ACHILLES TENDON LENGTHENING;  Surgeon: Wylene Simmer, MD;  Location: [place];  Service: Orthopedics;  Laterality: Left;  . TOTAL KNEE ARTHROPLASTY Right 06/08/2016   Procedure: RIGHT TOTAL KNEE ARTHROPLASTY;  Surgeon: Gaynelle Arabian, MD;  Location: [facility];  Service: Orthopedics;  Laterality: Right;  . TOTAL SHOULDER ARTHROPLASTY Right 11/23/2019    Procedure: REVERSE TOTAL SHOULDER ARTHROPLASTY;  Surgeon: Tania Ade, MD;  Location: [facility];  Service: Orthopedics;  Laterality: Right;    There were no vitals filed for this visit.  Subjective Assessment - 12/26/19 0814    Subjective  COVID-19 screen performed prior to patient entering clinic. Hasn't been wearing her sling in public but very careful if someone comes up to her.    Pertinent History  right massive rotator cuff tear (irreparable), knee replacement, ankle replacement, HTN    Limitations  Lifting;House hold activities    Diagnostic tests  MRI: massive tear of right rotator cuff    Patient Stated Goals  Use right UE without much pain.    Currently in Pain?  No/denies         [facility] PT Assessment - 12/26/19 0001      Assessment   Medical Diagnosis  S/p right reverse shoulder.    Referring Provider (PT)  Tania Ade MD    Onset Date/Surgical Date  11/23/19    Hand Dominance  Right    Next MD Visit  01/03/2020      Precautions   Precaution Comments  PER PROTOCOL.    Required Braces or Orthoses  Sling                    [facility] Adult PT Treatment/Exercise - 12/26/19 0001  Exercises   Exercises  Shoulder      Shoulder Exercises: Pulleys   Flexion  3 minutes      Shoulder Exercises: ROM/Strengthening   Ranger  Seated UE ranger into flex/ext, CW and CCW circles x5 min total      Modalities   Modalities  Electrical Stimulation;Vasopneumatic      Electrical Stimulation   Electrical Stimulation Location  R shoulder    Electrical Stimulation Action  Pre-Mod    Electrical Stimulation Parameters  80-150 hz x10 min    Electrical Stimulation Goals  Edema;Pain      Vasopneumatic   Number Minutes Vasopneumatic   10 minutes    Vasopnuematic Location   Shoulder    Vasopneumatic Pressure  Low    Vasopneumatic Temperature   34      Manual Therapy   Manual Therapy  Passive ROM    Passive ROM  PROM of R shoulder into flexion, abduction, ER, IR with  gentle holds at end range; intermittant oscillations provided to reduce muscle guarding                  PT Long Term Goals - 12/21/19 1035      PT LONG TERM GOAL #1   Title  Patient will be independent with HEP.    Time  6    Period  Weeks    Status  Achieved      PT LONG TERM GOAL #2   Title  Patient will report ability to perform ADLs with right shoulder pain less than or equal to 4/10.    Time  6    Period  Weeks    Status  On-going      PT LONG TERM GOAL #3   Title  Patient will demonstrate 70= degrees of right shoulder ER AROM to improve ability to don/doff apparel.    Time  6    Period  Weeks    Status  On-going      PT LONG TERM GOAL #4   Title  Patient will demonstrate 140+ degrees of right shoulder flexion AROM to improve ability to perform shoulder height activities.    Time  6    Period  Weeks    Status  On-going      PT LONG TERM GOAL #5   Title  Perform ADL's with pain not > 3/10.    Time  6    Period  Weeks    Status  On-going            Plan - 12/26/19 0851    Clinical Impression Statement  Patient presented in clinic with reports of not using sling in public and not donned upon arrival. Patient introduced to initial AAROM exercises such as pulley and UE ranger in sitting. Patient reported fatigue and some discomfort with AAROM exercises. Firm end feels and smooth arc of motion noted during PROM of R shoulder. intermittant oscillations provided during PROM session to promote relaxation and reduce muscle guarding. Normal modalities response noted following removal of the modalities.    Personal Factors and Comorbidities  Age;Comorbidity 2    Comorbidities  right massive rotator cuff tear (irreparable), knee replacement, ankle replacement, HTN    Examination-Activity Limitations  Dressing;Hygiene/Grooming;Reach Overhead;Other    Examination-Participation Restrictions  Cleaning;Other    Stability/Clinical Decision Making  Stable/Uncomplicated     Rehab Potential  Excellent    PT Frequency  2x / week    PT Duration  6 weeks  PT Treatment/Interventions  ADLs/Self Care Home Management;Cryotherapy;Electrical Stimulation;Iontophoresis 4mg /ml Dexamethasone;Moist Heat;Therapeutic exercise;Neuromuscular re-education;Manual techniques;Passive range of motion;Therapeutic activities;Patient/family education;Vasopneumatic Device    PT Next Visit Plan  Begin with gentle right shoulder PROM.  Progress per protocol.  Vasopneumatic on low with pillow between elbow and thorax.    PT Home Exercise Plan  see patient education section    Consulted and Agree with Plan of Care  Patient       Patient will benefit from skilled therapeutic intervention in order to improve the following deficits and impairments:  Decreased activity tolerance, Decreased strength, Decreased range of motion, Pain, Impaired UE functional use, Increased edema  Visit Diagnosis: Chronic right shoulder pain  Stiffness of right shoulder, not elsewhere classified  Muscle weakness (generalized)     Problem List Patient Active Problem List   Diagnosis Date Noted  . Status post reverse arthroplasty of right shoulder 11/23/2019  . Morbid obesity ([place]) 11/18/2018  . OA (osteoarthritis) of knee 06/08/2016  . Arthritis of knee, right 01/31/2016  . Hyperlipidemia 09/02/2015  . Pre-diabetes 07/12/2015  . Essential hypertension 07/03/2015  . Post-traumatic arthritis of ankle 12/20/2014  . Acute medial meniscal tear 02/06/2014  . Low bone mass 12/14/2012  . GAD (generalized anxiety disorder) 12/14/2012  . Vitamin D deficiency 12/14/2012    Standley Brooking, PTA 12/26/2019, 9:04 AM  [facility] [address] [place], [place], [address] Phone: [phone]   Fax:  [phone]  Name: Mary Moss MRN: [id number] Date of Birth: 25-Oct-1935

## 2019-12-29 ENCOUNTER — Ambulatory Visit: Payer: Medicare Other | Admitting: Physical Therapy

## 2019-12-29 ENCOUNTER — Other Ambulatory Visit: Payer: Self-pay

## 2019-12-29 DIAGNOSIS — M6281 Muscle weakness (generalized): Secondary | ICD-10-CM

## 2019-12-29 DIAGNOSIS — M25611 Stiffness of right shoulder, not elsewhere classified: Secondary | ICD-10-CM

## 2019-12-29 DIAGNOSIS — G8929 Other chronic pain: Secondary | ICD-10-CM

## 2019-12-29 DIAGNOSIS — M25511 Pain in right shoulder: Secondary | ICD-10-CM | POA: Diagnosis not present

## 2019-12-29 NOTE — Therapy (Signed)
[facility] [place], [place], [address] Phone: [phone]   Fax:  [phone]  Physical Therapy Treatment  Patient Details  Name: Mary Moss MRN: [id number] Date of Birth: [birth year] Referring Provider (PT): Tania Ade MD   Encounter Date: 12/29/2019  PT End of Session - 12/29/19 0852    Visit Number  7    Number of Visits  12    Date for PT Re-Evaluation  03/07/20    Authorization Type  FOTO.    PT Start Time  0817    PT Stop Time  0904    PT Time Calculation (min)  47 min    Activity Tolerance  Patient tolerated treatment well    Behavior During Therapy  [facility] for tasks assessed/performed       Past Medical History:  Diagnosis Date  . Ankle fracture fx 13 yrs ago, anklw swells off and on, has cramps in ankle also   left, to seee dr hewitt about 02-16-14  . Arthritis   . Basal cell carcinoma 2015   generalized  . GERD (gastroesophageal reflux disease)    OTC med   pt. denies  . Hypertension   . Pneumonia     Past Surgical History:  Procedure Laterality Date  . ABDOMINAL HYSTERECTOMY    . EYE SURGERY Bilateral    bilaterla cataract extraction with IOL  . FRACTURE SURGERY Left 2001  . JOINT REPLACEMENT    . KNEE ARTHROSCOPY Right 02/07/2014   Procedure: RIGHT ARTHROSCOPY KNEE, WITH MEDIAL MENISCAL DEBRIDEMENT AND CHONDRAPLASTY;  Surgeon: Gearlean Alf, MD;  [facility]: [facility];  Service: Orthopedics;  Laterality: Right;  . ROTATOR CUFF REPAIR Right 2009  . TOTAL ANKLE ARTHROPLASTY Left 12/20/2014   Procedure: LEFT TOTAL ANKLE ARTHOPLASTY WITH PERCUTANEOUS ACHILLES TENDON LENGTHENING;  Surgeon: Wylene Simmer, MD;  Location: [place];  Service: Orthopedics;  Laterality: Left;  . TOTAL KNEE ARTHROPLASTY Right 06/08/2016   Procedure: RIGHT TOTAL KNEE ARTHROPLASTY;  Surgeon: Gaynelle Arabian, MD;  [facility];  Service: Orthopedics;  Laterality: Right;  . TOTAL SHOULDER ARTHROPLASTY Right 11/23/2019   Procedure: REVERSE TOTAL SHOULDER ARTHROPLASTY;  Surgeon: Tania Ade, MD;  [facility];  Service: Orthopedics;  Laterality: Right;    There were no vitals filed for this visit.  Subjective Assessment - 12/29/19 0823    Subjective  COVID-19 screen performed prior to patient entering clinic.  Doing okay.  Some pinching in shoulder.    Pertinent History  right massive rotator cuff tear (irreparable), knee replacement, ankle replacement, HTN    Limitations  Lifting;House hold activities    Diagnostic tests  MRI: massive tear of right rotator cuff    Patient Stated Goals  Use right UE without much pain.    Currently in Pain?  Yes    Pain Location  Shoulder    Pain Orientation  Right    Pain Descriptors / Indicators  Sore    Pain Type  Surgical pain    Pain Onset  1 to 4 weeks ago                        [facility] Adult PT Treatment/Exercise - 12/29/19 0001      Shoulder Exercises: Pulleys   Flexion  5 minutes    Other Pulley Exercises  Seated UE Ranger x 5 minutes.      Modalities   Modalities  Firefighter  Electrical Stimulation Location  RT shoulder.    Electrical Stimulation Action  IFC    Electrical Stimulation Parameters  80-150 Hz x 15 minutes.    Electrical Stimulation Goals  Edema;Pain      Vasopneumatic   Number Minutes Vasopneumatic   15 minutes    Vasopnuematic Location   --   RT shoulder.   Vasopneumatic Pressure  Low      Manual Therapy   Manual Therapy  Passive ROM    Passive ROM  In supine:  PROM to patient's right shoulder x 13 minutes into flexion, abduction and ER.                  PT Long Term Goals - 12/21/19 1035      PT LONG TERM GOAL #1   Title  Patient will be independent with HEP.    Time  6    Period  Weeks    Status  Achieved      PT LONG TERM GOAL #2   Title  Patient will report ability to perform ADLs with right shoulder pain less than or equal to  4/10.    Time  6    Period  Weeks    Status  On-going      PT LONG TERM GOAL #3   Title  Patient will demonstrate 70= degrees of right shoulder ER AROM to improve ability to don/doff apparel.    Time  6    Period  Weeks    Status  On-going      PT LONG TERM GOAL #4   Title  Patient will demonstrate 140+ degrees of right shoulder flexion AROM to improve ability to perform shoulder height activities.    Time  6    Period  Weeks    Status  On-going      PT LONG TERM GOAL #5   Title  Perform ADL's with pain not > 3/10.    Time  6    Period  Weeks    Status  On-going            Plan - 12/29/19 8119    Clinical Impression Statement  The patient did well today though she experiencing some "pinching" in her right shoulder with certain RT UE motions into her middle deltoid and biceps region.    Personal Factors and Comorbidities  Age;Comorbidity 2    Comorbidities  right massive rotator cuff tear (irreparable), knee replacement, ankle replacement, HTN    Examination-Activity Limitations  Dressing;Hygiene/Grooming;Reach Overhead;Other    Examination-Participation Restrictions  Cleaning;Other    Stability/Clinical Decision Making  Stable/Uncomplicated    Rehab Potential  Excellent    PT Frequency  2x / week    PT Duration  6 weeks    PT Treatment/Interventions  ADLs/Self Care Home Management;Cryotherapy;Electrical Stimulation;Iontophoresis 4mg /ml Dexamethasone;Moist Heat;Therapeutic exercise;Neuromuscular re-education;Manual techniques;Passive range of motion;Therapeutic activities;Patient/family education;Vasopneumatic Device    PT Next Visit Plan  Begin with gentle right shoulder PROM.  Progress per protocol.  Vasopneumatic on low with pillow between elbow and thorax.    PT Home Exercise Plan  see patient education section    Consulted and Agree with Plan of Care  Patient       Patient will benefit from skilled therapeutic intervention in order to improve the following deficits  and impairments:  Decreased activity tolerance, Decreased strength, Decreased range of motion, Pain, Impaired UE functional use, Increased edema  Visit Diagnosis: Chronic right shoulder pain  Stiffness of right shoulder,  not elsewhere classified  Muscle weakness (generalized)     Problem List Patient Active Problem List   Diagnosis Date Noted  . Status post reverse arthroplasty of right shoulder 11/23/2019  . Morbid obesity ([place]) 11/18/2018  . OA (osteoarthritis) of knee 06/08/2016  . Arthritis of knee, right 01/31/2016  . Hyperlipidemia 09/02/2015  . Pre-diabetes 07/12/2015  . Essential hypertension 07/03/2015  . Post-traumatic arthritis of ankle 12/20/2014  . Acute medial meniscal tear 02/06/2014  . Low bone mass 12/14/2012  . GAD (generalized anxiety disorder) 12/14/2012  . Vitamin D deficiency 12/14/2012    Rishon Thilges, [place] MPT 12/29/2019, 9:06 AM  [facility] [address] [place], [place], [address] Phone: [phone]   Fax:  [phone]  Name: Mary Moss MRN: [id number] Date of Birth: 21-May-1936

## 2020-01-01 ENCOUNTER — Ambulatory Visit: Payer: Medicare Other | Admitting: *Deleted

## 2020-01-01 ENCOUNTER — Other Ambulatory Visit: Payer: Self-pay

## 2020-01-01 DIAGNOSIS — M25611 Stiffness of right shoulder, not elsewhere classified: Secondary | ICD-10-CM

## 2020-01-01 DIAGNOSIS — M6281 Muscle weakness (generalized): Secondary | ICD-10-CM

## 2020-01-01 DIAGNOSIS — M25511 Pain in right shoulder: Secondary | ICD-10-CM | POA: Diagnosis not present

## 2020-01-01 DIAGNOSIS — G8929 Other chronic pain: Secondary | ICD-10-CM

## 2020-01-01 DIAGNOSIS — R293 Abnormal posture: Secondary | ICD-10-CM

## 2020-01-01 DIAGNOSIS — M5442 Lumbago with sciatica, left side: Secondary | ICD-10-CM

## 2020-01-01 NOTE — Therapy (Signed)
[facility] [place], [place], [address] Phone: [phone]   Fax:  [phone]  Physical Therapy Treatment  Patient Details  Name: Mary Moss MRN: [id number] Date of Birth: August 14, 1935 Referring Provider (PT): Tania Ade MD   Encounter Date: 01/01/2020  PT End of Session - 01/01/20 0824    Visit Number  8    Number of Visits  12    Date for PT Re-Evaluation  03/07/20    Authorization Type  FOTO.    PT Start Time  0815    PT Stop Time  0910    PT Time Calculation (min)  55 min       Past Medical History:  Diagnosis Date  . Ankle fracture fx 13 yrs ago, anklw swells off and on, has cramps in ankle also   left, to seee dr hewitt about 02-16-14  . Arthritis   . Basal cell carcinoma 2015   generalized  . GERD (gastroesophageal reflux disease)    OTC med   pt. denies  . Hypertension   . Pneumonia     Past Surgical History:  Procedure Laterality Date  . ABDOMINAL HYSTERECTOMY    . EYE SURGERY Bilateral    bilaterla cataract extraction with IOL  . FRACTURE SURGERY Left 2001  . JOINT REPLACEMENT    . KNEE ARTHROSCOPY Right 02/07/2014   Procedure: RIGHT ARTHROSCOPY KNEE, WITH MEDIAL MENISCAL DEBRIDEMENT AND CHONDRAPLASTY;  Surgeon: Gearlean Alf, MD;  Location: [facility];  Service: Orthopedics;  Laterality: Right;  . ROTATOR CUFF REPAIR Right 2009  . TOTAL ANKLE ARTHROPLASTY Left 12/20/2014   Procedure: LEFT TOTAL ANKLE ARTHOPLASTY WITH PERCUTANEOUS ACHILLES TENDON LENGTHENING;  Surgeon: Wylene Simmer, MD;  Location: [facility];  Service: Orthopedics;  Laterality: Left;  . TOTAL KNEE ARTHROPLASTY Right 06/08/2016   Procedure: RIGHT TOTAL KNEE ARTHROPLASTY;  Surgeon: Gaynelle Arabian, MD;  Location: [facility];  Service: Orthopedics;  Laterality: Right;  . TOTAL SHOULDER ARTHROPLASTY Right 11/23/2019   Procedure: REVERSE TOTAL SHOULDER ARTHROPLASTY;  Surgeon: Tania Ade, MD;  Location: [facility];  Service: Orthopedics;   Laterality: Right;    There were no vitals filed for this visit.  Subjective Assessment - 01/01/20 0820    Subjective  COVID-19 screen performed prior to patient entering clinic.  Doing okay.  Doing better.    Pertinent History  right massive rotator cuff tear (irreparable), knee replacement, ankle replacement, HTN    Limitations  Lifting;House hold activities    Diagnostic tests  MRI: massive tear of right rotator cuff    Patient Stated Goals  Use right UE without much pain.    Pain Score  5     Pain Location  Shoulder    Pain Orientation  Right    Pain Descriptors / Indicators  Sore    Pain Type  Surgical pain    Pain Onset  1 to 4 weeks ago                        [facility] Adult PT Treatment/Exercise - 01/01/20 0001      Exercises   Exercises  Shoulder      Shoulder Exercises: Seated   Elevation  AROM;AAROM   upper cut  3x10   External Rotation  AROM;AAROM;Right;10 reps;20 reps   hitch hiker     Shoulder Exercises: Pulleys   Flexion  5 minutes    Other Pulley Exercises  Seated UE Ranger x 5 minutes.  Modalities   Modalities  Psychologist, educational Location  RT shoulder.    Electrical Stimulation Goals  Edema;Pain      Vasopneumatic   Number Minutes Vasopneumatic   15 minutes    Vasopnuematic Location   Shoulder    Vasopneumatic Pressure  Low    Vasopneumatic Temperature   34      Manual Therapy   Manual Therapy  Passive ROM    Passive ROM  In supine:  PROM to patient's right shoulder for  flexion, abduction and ER. within protocol limits, AAROM for hitch hiker  and  upper cut 3 x 10 each                  PT Long Term Goals - 12/21/19 1035      PT LONG TERM GOAL #1   Title  Patient will be independent with HEP.    Time  6    Period  Weeks    Status  Achieved      PT LONG TERM GOAL #2   Title  Patient will report ability to perform ADLs with right shoulder pain  less than or equal to 4/10.    Time  6    Period  Weeks    Status  On-going      PT LONG TERM GOAL #3   Title  Patient will demonstrate 70= degrees of right shoulder ER AROM to improve ability to don/doff apparel.    Time  6    Period  Weeks    Status  On-going      PT LONG TERM GOAL #4   Title  Patient will demonstrate 140+ degrees of right shoulder flexion AROM to improve ability to perform shoulder height activities.    Time  6    Period  Weeks    Status  On-going      PT LONG TERM GOAL #5   Title  Perform ADL's with pain not > 3/10.    Time  6    Period  Weeks    Status  On-going            Plan - 01/01/20 0902    Clinical Impression Statement  Pt arrived today doing fairly well except for RT shldr soreness. She was able to perform AAROM exs without complaints. Rx focused on ROM and mm activation for elevation and ER with AAROM/ AROM with hitch hiker and upper cut. Normal modality response today.    Comorbidities  right massive rotator cuff tear (irreparable), knee replacement, ankle replacement, HTN    Examination-Participation Restrictions  Cleaning;Other    Rehab Potential  Excellent    PT Frequency  2x / week    PT Duration  6 weeks    PT Treatment/Interventions  ADLs/Self Care Home Management;Cryotherapy;Electrical Stimulation;Iontophoresis 4mg /ml Dexamethasone;Moist Heat;Therapeutic exercise;Neuromuscular re-education;Manual techniques;Passive range of motion;Therapeutic activities;Patient/family education;Vasopneumatic Device    PT Next Visit Plan  Begin with gentle right shoulder PROM.  Progress per protocol.  Vasopneumatic on low with pillow between elbow and thorax.    PT Home Exercise Plan  see patient education section    Consulted and Agree with Plan of Care  Patient       Patient will benefit from skilled therapeutic intervention in order to improve the following deficits and impairments:  Decreased activity tolerance, Decreased strength, Decreased range  of motion, Pain, Impaired UE functional use, Increased edema  Visit Diagnosis: Stiffness of  right shoulder, not elsewhere classified  Chronic right shoulder pain  Muscle weakness (generalized)  Abnormal posture  Acute left-sided low back pain with left-sided sciatica     Problem List Patient Active Problem List   Diagnosis Date Noted  . Status post reverse arthroplasty of right shoulder 11/23/2019  . Morbid obesity ([place]) 11/18/2018  . OA (osteoarthritis) of knee 06/08/2016  . Arthritis of knee, right 01/31/2016  . Hyperlipidemia 09/02/2015  . Pre-diabetes 07/12/2015  . Essential hypertension 07/03/2015  . Post-traumatic arthritis of ankle 12/20/2014  . Acute medial meniscal tear 02/06/2014  . Low bone mass 12/14/2012  . GAD (generalized anxiety disorder) 12/14/2012  . Vitamin D deficiency 12/14/2012    Kathelyn Gombos,CHRIS, PTA 01/01/2020, 9:54 AM  [facility] [address] [place], [place], [address] Phone: [phone]   Fax:  [phone]  Name: Mary Moss MRN: [id number] Date of Birth: 1936-02-21

## 2020-01-04 ENCOUNTER — Other Ambulatory Visit: Payer: Self-pay

## 2020-01-04 ENCOUNTER — Ambulatory Visit: Payer: Medicare Other | Admitting: *Deleted

## 2020-01-04 DIAGNOSIS — M6281 Muscle weakness (generalized): Secondary | ICD-10-CM

## 2020-01-04 DIAGNOSIS — M25611 Stiffness of right shoulder, not elsewhere classified: Secondary | ICD-10-CM

## 2020-01-04 DIAGNOSIS — R293 Abnormal posture: Secondary | ICD-10-CM

## 2020-01-04 DIAGNOSIS — M25511 Pain in right shoulder: Secondary | ICD-10-CM

## 2020-01-04 NOTE — Therapy (Signed)
[facility] [place], [place], [address] Phone: [phone]   Fax:  [phone]  Physical Therapy Treatment  Patient Details  Name: Mary Moss MRN: [id number] Date of Birth: 1935/12/04 Referring Provider (PT): Tania Ade MD   Encounter Date: 01/04/2020   PT End of Session - 01/04/20 0850    Visit Number 9    Number of Visits 12    Date for PT Re-Evaluation 03/07/20    Authorization Type FOTO.    PT Start Time 0815    PT Stop Time 0901    PT Time Calculation (min) 46 min           Past Medical History:  Diagnosis Date  . Ankle fracture fx 13 yrs ago, anklw swells off and on, has cramps in ankle also   left, to seee dr hewitt about 02-16-14  . Arthritis   . Basal cell carcinoma 2015   generalized  . GERD (gastroesophageal reflux disease)    OTC med   pt. denies  . Hypertension   . Pneumonia     Past Surgical History:  Procedure Laterality Date  . ABDOMINAL HYSTERECTOMY    . EYE SURGERY Bilateral    bilaterla cataract extraction with IOL  . FRACTURE SURGERY Left 2001  . JOINT REPLACEMENT    . KNEE ARTHROSCOPY Right 02/07/2014   Procedure: RIGHT ARTHROSCOPY KNEE, WITH MEDIAL MENISCAL DEBRIDEMENT AND CHONDRAPLASTY;  Surgeon: Gearlean Alf, MD;  Location: [facility];  Service: Orthopedics;  Laterality: Right;  . ROTATOR CUFF REPAIR Right 2009  . TOTAL ANKLE ARTHROPLASTY Left 12/20/2014   Procedure: LEFT TOTAL ANKLE ARTHOPLASTY WITH PERCUTANEOUS ACHILLES TENDON LENGTHENING;  Surgeon: Wylene Simmer, MD;  Location: [facility];  Service: Orthopedics;  Laterality: Left;  . TOTAL KNEE ARTHROPLASTY Right 06/08/2016   Procedure: RIGHT TOTAL KNEE ARTHROPLASTY;  Surgeon: Gaynelle Arabian, MD;  [facility];  Service: Orthopedics;  Laterality: Right;  . TOTAL SHOULDER ARTHROPLASTY Right 11/23/2019   Procedure: REVERSE TOTAL SHOULDER ARTHROPLASTY;  Surgeon: Tania Ade, MD;  Location: [facility];  Service: Orthopedics;   Laterality: Right;    There were no vitals filed for this visit.   Subjective Assessment - 01/04/20 0844    Subjective COVID-19 screen performed prior to patient entering clinic.  Doing okay.  Doing better.    Diagnostic tests MRI: massive tear of right rotator cuff    Patient Stated Goals Use right UE without much pain.    Currently in Pain? Yes    Pain Score 5     Pain Location Shoulder    Pain Orientation Right    Pain Descriptors / Indicators Sore    Pain Type Surgical pain    Pain Onset 1 to 4 weeks ago                             [facility] Adult PT Treatment/Exercise - 01/04/20 0001      Exercises   Exercises Shoulder      Shoulder Exercises: Supine   Protraction AROM;Right;10 reps;20 reps   3x10     Shoulder Exercises: Seated   Elevation AROM;AAROM   upper cut  3x10   External Rotation AROM;AAROM;Right;10 reps;20 reps   hitch hiker     Shoulder Exercises: Pulleys   Flexion 5 minutes    Other Pulley Exercises Seated UE Ranger x 5 minutes.      Manual Therapy   Manual Therapy Passive ROM  Passive ROM In supine:  PROM to patient's right shoulder for  flexion, abduction and ER.  AAROM for hitch hiker  and  upper cut 3 x 10 each in supine                       PT Long Term Goals - 12/21/19 1035      PT LONG TERM GOAL #1   Title Patient will be independent with HEP.    Time 6    Period Weeks    Status Achieved      PT LONG TERM GOAL #2   Title Patient will report ability to perform ADLs with right shoulder pain less than or equal to 4/10.    Time 6    Period Weeks    Status On-going      PT LONG TERM GOAL #3   Title Patient will demonstrate 70= degrees of right shoulder ER AROM to improve ability to don/doff apparel.    Time 6    Period Weeks    Status On-going      PT LONG TERM GOAL #4   Title Patient will demonstrate 140+ degrees of right shoulder flexion AROM to improve ability to perform shoulder height activities.     Time 6    Period Weeks    Status On-going      PT LONG TERM GOAL #5   Title Perform ADL's with pain not > 3/10.    Time 6    Period Weeks    Status On-going                 Plan - 01/04/20 2210    Clinical Impression Statement Pt arrived today in good spirits and feels that her arm is doig good just weak. Rx focused on increasig elevatio control without compensatinb and ROM. No modalities as per Pt.    Stability/Clinical Decision Making Stable/Uncomplicated    PT Treatment/Interventions ADLs/Self Care Home Management;Cryotherapy;Electrical Stimulation;Iontophoresis 4mg /ml Dexamethasone;Moist Heat;Therapeutic exercise;Neuromuscular re-education;Manual techniques;Passive range of motion;Therapeutic activities;Patient/family education;Vasopneumatic Device    PT Next Visit Plan Begin with gentle right shoulder PROM.  Progress per protocol.  Vasopneumatic on low with pillow between elbow and thorax.    PT Home Exercise Plan see patient education section           Patient will benefit from skilled therapeutic intervention in order to improve the following deficits and impairments:     Visit Diagnosis: Stiffness of right shoulder, not elsewhere classified  Chronic right shoulder pain  Muscle weakness (generalized)  Abnormal posture     Problem List Patient Active Problem List   Diagnosis Date Noted  . Status post reverse arthroplasty of right shoulder 11/23/2019  . Morbid obesity ([place]) 11/18/2018  . OA (osteoarthritis) of knee 06/08/2016  . Arthritis of knee, right 01/31/2016  . Hyperlipidemia 09/02/2015  . Pre-diabetes 07/12/2015  . Essential hypertension 07/03/2015  . Post-traumatic arthritis of ankle 12/20/2014  . Acute medial meniscal tear 02/06/2014  . Low bone mass 12/14/2012  . GAD (generalized anxiety disorder) 12/14/2012  . Vitamin D deficiency 12/14/2012    Mary Moss,Mary Moss Mary Moss 01/04/2020, 10:15 PM  [facility] [address] [place], [place], [address] Phone: [phone]   Fax:  [phone]  Name: Mary Moss MRN: [id number] Date of Birth: 08-03-1935

## 2020-01-08 ENCOUNTER — Other Ambulatory Visit: Payer: Self-pay

## 2020-01-08 ENCOUNTER — Ambulatory Visit: Payer: Medicare Other | Admitting: *Deleted

## 2020-01-08 DIAGNOSIS — M25511 Pain in right shoulder: Secondary | ICD-10-CM | POA: Diagnosis not present

## 2020-01-08 DIAGNOSIS — R293 Abnormal posture: Secondary | ICD-10-CM

## 2020-01-08 DIAGNOSIS — M6281 Muscle weakness (generalized): Secondary | ICD-10-CM

## 2020-01-08 DIAGNOSIS — M25611 Stiffness of right shoulder, not elsewhere classified: Secondary | ICD-10-CM

## 2020-01-08 NOTE — Therapy (Signed)
[facility] [place], [place], [address] Phone: [phone]   Fax:  [phone]  Physical Therapy Treatment  Patient Details  Name: Mary Moss MRN: [id number] Date of Birth: 06/09/1936 Referring Provider (PT): Tania Ade MD   Encounter Date: 01/08/2020   PT End of Session - 01/08/20 0823    Visit Number 10    Number of Visits 12    Date for PT Re-Evaluation 03/07/20    Authorization Type FOTO.   10th vist  46% limitation    PT Start Time 0815    PT Stop Time 0909    PT Time Calculation (min) 54 min           Past Medical History:  Diagnosis Date   Ankle fracture fx 13 yrs ago, anklw swells off and on, has cramps in ankle also   left, to seee dr hewitt about 02-16-14   Arthritis    Basal cell carcinoma 2015   generalized   GERD (gastroesophageal reflux disease)    OTC med   pt. denies   Hypertension    Pneumonia     Past Surgical History:  Procedure Laterality Date   ABDOMINAL HYSTERECTOMY     EYE SURGERY Bilateral    bilaterla cataract extraction with IOL   FRACTURE SURGERY Left 2001   JOINT REPLACEMENT     KNEE ARTHROSCOPY Right 02/07/2014   Procedure: RIGHT ARTHROSCOPY KNEE, WITH MEDIAL MENISCAL DEBRIDEMENT AND CHONDRAPLASTY;  Surgeon: Gearlean Alf, MD;  Location: [facility];  Service: Orthopedics;  Laterality: Right;   ROTATOR CUFF REPAIR Right 2009   TOTAL ANKLE ARTHROPLASTY Left 12/20/2014   Procedure: LEFT TOTAL ANKLE ARTHOPLASTY WITH PERCUTANEOUS ACHILLES TENDON LENGTHENING;  Surgeon: Wylene Simmer, MD;  Location: [facility];  Service: Orthopedics;  Laterality: Left;   TOTAL KNEE ARTHROPLASTY Right 06/08/2016   Procedure: RIGHT TOTAL KNEE ARTHROPLASTY;  Surgeon: Gaynelle Arabian, MD;  Location[facility];  Service: Orthopedics;  Laterality: Right;   TOTAL SHOULDER ARTHROPLASTY Right 11/23/2019   Procedure: REVERSE TOTAL SHOULDER ARTHROPLASTY;  Surgeon: Tania Ade, MD;  [facility];  Service: Orthopedics;  Laterality: Right;    There were no vitals filed for this visit.   Subjective Assessment - 01/08/20 0822    Subjective COVID-19 screen performed prior to patient entering clinic.  Doing okay.  Doing better.    Pertinent History right massive rotator cuff tear (irreparable), knee replacement, ankle replacement, HTN    Limitations Lifting;House hold activities    Diagnostic tests MRI: massive tear of right rotator cuff    Patient Stated Goals Use right UE without much pain.    Currently in Pain? Yes    Pain Score 2     Pain Orientation Right    Pain Descriptors / Indicators Sore    Pain Type Surgical pain    Pain Onset 1 to 4 weeks ago                             [facility] Adult PT Treatment/Exercise - 01/08/20 0001      Exercises   Exercises Shoulder      Shoulder Exercises: Supine   Protraction AROM;Right;10 reps;20 reps      Shoulder Exercises: Seated   Elevation AROM;AAROM   upper cut  3x10   External Rotation AROM;AAROM;Right;10 reps;20 reps   hitch hiker     Shoulder Exercises: Standing   Flexion AAROM;Right;10 reps;20 reps   manual  assistance.     Shoulder Exercises: Pulleys   Flexion 5 minutes    Other Pulley Exercises Seated UE Ranger x 5 minutes.      Modalities   Modalities Psychologist, educational Location RT shoulder.    Electrical Stimulation Goals Edema;Pain      Vasopneumatic   Number Minutes Vasopneumatic  15 minutes    Vasopnuematic Location  Shoulder    Vasopneumatic Pressure Low    Vasopneumatic Temperature  34      Manual Therapy   Manual Therapy Passive ROM    Passive ROM In supine:  PROM to patient's right shoulder for  flexion, abduction and ER.  AAROM for hitch hiker  and  upper cut 3 x 10 each in supine                            Bicep curl 3# 3x10             PT Long Term Goals - 12/21/19 1035      PT LONG TERM  GOAL #1   Title Patient will be independent with HEP.    Time 6    Period Weeks    Status Achieved      PT LONG TERM GOAL #2   Title Patient will report ability to perform ADLs with right shoulder pain less than or equal to 4/10.    Time 6    Period Weeks    Status On-going      PT LONG TERM GOAL #3   Title Patient will demonstrate 70= degrees of right shoulder ER AROM to improve ability to don/doff apparel.    Time 6    Period Weeks    Status On-going      PT LONG TERM GOAL #4   Title Patient will demonstrate 140+ degrees of right shoulder flexion AROM to improve ability to perform shoulder height activities.    Time 6    Period Weeks    Status On-going      PT LONG TERM GOAL #5   Title Perform ADL's with pain not > 3/10.    Time 6    Period Weeks    Status On-going                 Plan - 01/08/20 0900    Clinical Impression Statement Pt arrived today doing fair , but worried about not being able to raise her arm yet. We discussed how this takes time and Rx focused on standing and supine AAROM exs as well as manual assistance to facilitate elevation. Normal modality response today    Personal Factors and Comorbidities Age;Comorbidity 2    Comorbidities right massive rotator cuff tear (irreparable), knee replacement, ankle replacement, HTN    Examination-Activity Limitations Dressing;Hygiene/Grooming;Reach Overhead;Other    Examination-Participation Restrictions Cleaning;Other    Stability/Clinical Decision Making Stable/Uncomplicated    Rehab Potential Excellent    PT Frequency 2x / week    PT Treatment/Interventions ADLs/Self Care Home Management;Cryotherapy;Electrical Stimulation;Iontophoresis 4mg /ml Dexamethasone;Moist Heat;Therapeutic exercise;Neuromuscular re-education;Manual techniques;Passive range of motion;Therapeutic activities;Patient/family education;Vasopneumatic Device    PT Next Visit Plan Begin with gentle right shoulder PROM.  Progress per protocol.   Vasopneumatic on low with pillow between elbow and thorax.           Patient will benefit from skilled therapeutic intervention in order to improve the following deficits and impairments:  Decreased  activity tolerance, Decreased strength, Decreased range of motion, Pain, Impaired UE functional use, Increased edema  Visit Diagnosis: Stiffness of right shoulder, not elsewhere classified  Chronic right shoulder pain  Muscle weakness (generalized)  Abnormal posture     Problem List Patient Active Problem List   Diagnosis Date Noted   Status post reverse arthroplasty of right shoulder 11/23/2019   Morbid obesity ([place]) 11/18/2018   OA (osteoarthritis) of knee 06/08/2016   Arthritis of knee, right 01/31/2016   Hyperlipidemia 09/02/2015   Pre-diabetes 07/12/2015   Essential hypertension 07/03/2015   Post-traumatic arthritis of ankle 12/20/2014   Acute medial meniscal tear 02/06/2014   Low bone mass 12/14/2012   GAD (generalized anxiety disorder) 12/14/2012   Vitamin D deficiency 12/14/2012    Jarvin Ogren,CHRIS, PTA 01/08/2020, 11:11 AM  [facility] [address] [place], [place], [address] Phone: [phone]   Fax:  [phone]  Name: BRION HEDGES MRN: [id number] Date of Birth: 07/24/1936

## 2020-01-11 ENCOUNTER — Other Ambulatory Visit: Payer: Self-pay

## 2020-01-11 ENCOUNTER — Ambulatory Visit: Payer: Medicare Other | Admitting: *Deleted

## 2020-01-11 DIAGNOSIS — M25511 Pain in right shoulder: Secondary | ICD-10-CM | POA: Diagnosis not present

## 2020-01-11 DIAGNOSIS — M6281 Muscle weakness (generalized): Secondary | ICD-10-CM

## 2020-01-11 DIAGNOSIS — M25611 Stiffness of right shoulder, not elsewhere classified: Secondary | ICD-10-CM

## 2020-01-11 DIAGNOSIS — R293 Abnormal posture: Secondary | ICD-10-CM

## 2020-01-11 NOTE — Therapy (Signed)
[facility] [place], [place], [address] Phone: [phone]   Fax:  [phone]  Physical Therapy Treatment  Patient Details  Name: Mary Moss MRN: [id number] Date of Birth: 1935-10-24 Referring Provider (PT): Tania Ade MD   Encounter Date: 01/11/2020   PT End of Session - 01/11/20 0828    Visit Number 11    Number of Visits 12    Date for PT Re-Evaluation 03/07/20    Authorization Type FOTO.   10th vist  46% limitation    PT Start Time 0815    PT Stop Time 0901    PT Time Calculation (min) 46 min           Past Medical History:  Diagnosis Date  . Ankle fracture fx 13 yrs ago, anklw swells off and on, has cramps in ankle also   left, to seee dr hewitt about 02-16-14  . Arthritis   . Basal cell carcinoma 2015   generalized  . GERD (gastroesophageal reflux disease)    OTC med   pt. denies  . Hypertension   . Pneumonia     Past Surgical History:  Procedure Laterality Date  . ABDOMINAL HYSTERECTOMY    . EYE SURGERY Bilateral    bilaterla cataract extraction with IOL  . FRACTURE SURGERY Left 2001  . JOINT REPLACEMENT    . KNEE ARTHROSCOPY Right 02/07/2014   Procedure: RIGHT ARTHROSCOPY KNEE, WITH MEDIAL MENISCAL DEBRIDEMENT AND CHONDRAPLASTY;  Surgeon: Gearlean Alf, MD;  Location: [facility];  Service: Orthopedics;  Laterality: Right;  . ROTATOR CUFF REPAIR Right 2009  . TOTAL ANKLE ARTHROPLASTY Left 12/20/2014   Procedure: LEFT TOTAL ANKLE ARTHOPLASTY WITH PERCUTANEOUS ACHILLES TENDON LENGTHENING;  Surgeon: Wylene Simmer, MD;  Location: [facility];  Service: Orthopedics;  Laterality: Left;  . TOTAL KNEE ARTHROPLASTY Right 06/08/2016   Procedure: RIGHT TOTAL KNEE ARTHROPLASTY;  Surgeon: Gaynelle Arabian, MD;  Location: [facility];  Service: Orthopedics;  Laterality: Right;  . TOTAL SHOULDER ARTHROPLASTY Right 11/23/2019   Procedure: REVERSE TOTAL SHOULDER ARTHROPLASTY;  Surgeon: Tania Ade, MD;  [facility]: [facility];  Service: Orthopedics;  Laterality: Right;    There were no vitals filed for this visit.   Subjective Assessment - 01/11/20 0828    Subjective COVID-19 screen performed prior to patient entering clinic.  Doing okay.  Doing better. Mainly soreness    Pertinent History right massive rotator cuff tear (irreparable), knee replacement, ankle replacement, HTN    Limitations Lifting;House hold activities    Diagnostic tests MRI: massive tear of right rotator cuff    Patient Stated Goals Use right UE without much pain.    Pain Onset 1 to 4 weeks ago                             [facility] Adult PT Treatment/Exercise - 01/11/20 0001      Exercises   Exercises Shoulder      Shoulder Exercises: Supine   Protraction AROM;Right;10 reps;20 reps      Shoulder Exercises: Seated   Elevation --    External Rotation AROM;AAROM;Right;10 reps;20 reps   hitch hiker     Shoulder Exercises: Standing   Flexion AAROM;Right;10 reps;20 reps   manual assistance. focus on eccentrics   Other Standing Exercises green ball on raised mat table and pushing it up onto wedge 3x10      Shoulder Exercises: Pulleys   Flexion 5 minutes  Other Pulley Exercises Seated UE Ranger x 3 minutes.      Manual Therapy   Manual Therapy Passive ROM    Passive ROM In supine:  PROM to patient's right shoulder for  flexion, abduction and ER.  AAROM for hitch hiker  and  upper cut 3 x 10 each in supine, Rhythmic stab  for elevation at 90 degrees as well as 100 degrees.PROM for ER 63 degrees and elevation 120 degrees.                       PT Long Term Goals - 12/21/19 1035      PT LONG TERM GOAL #1   Title Patient will be independent with HEP.    Time 6    Period Weeks    Status Achieved      PT LONG TERM GOAL #2   Title Patient will report ability to perform ADLs with right shoulder pain less than or equal to 4/10.    Time 6    Period Weeks    Status On-going      PT LONG TERM GOAL #3     Title Patient will demonstrate 70= degrees of right shoulder ER AROM to improve ability to don/doff apparel.    Time 6    Period Weeks    Status On-going      PT LONG TERM GOAL #4   Title Patient will demonstrate 140+ degrees of right shoulder flexion AROM to improve ability to perform shoulder height activities.    Time 6    Period Weeks    Status On-going      PT LONG TERM GOAL #5   Title Perform ADL's with pain not > 3/10.    Time 6    Period Weeks    Status On-going                 Plan - 01/11/20 0915    Clinical Impression Statement Pt arrived today  doing a little better with elevation, but unable to elevate RT UE due to weakness and mm re-education. Rx focused on elevation in standing as well as supine with AROM, AAROM and PROM. PROM for ER 63 degrees and elevation to 120 degrees.    Comorbidities right massive rotator cuff tear (irreparable), knee replacement, ankle replacement, HTN    Examination-Participation Restrictions Cleaning;Other    Stability/Clinical Decision Making Stable/Uncomplicated    Rehab Potential Excellent    PT Frequency 2x / week    PT Duration 6 weeks    PT Treatment/Interventions ADLs/Self Care Home Management;Cryotherapy;Electrical Stimulation;Iontophoresis 4mg /ml Dexamethasone;Moist Heat;Therapeutic exercise;Neuromuscular re-education;Manual techniques;Passive range of motion;Therapeutic activities;Patient/family education;Vasopneumatic Device    PT Next Visit Plan PROM.  Progress per protocol. Work on [employer], and PROM           Patient will benefit from skilled therapeutic intervention in order to improve the following deficits and impairments:  Decreased activity tolerance, Decreased strength, Decreased range of motion, Pain, Impaired UE functional use, Increased edema  Visit Diagnosis: Stiffness of right shoulder, not elsewhere classified  Chronic right shoulder pain  Muscle weakness (generalized)  Abnormal  posture     Problem List Patient Active Problem List   Diagnosis Date Noted  . Status post reverse arthroplasty of right shoulder 11/23/2019  . Morbid obesity ([place]) 11/18/2018  . OA (osteoarthritis) of knee 06/08/2016  . Arthritis of knee, right 01/31/2016  . Hyperlipidemia 09/02/2015  . Pre-diabetes 07/12/2015  . Essential hypertension 07/03/2015  .  Post-traumatic arthritis of ankle 12/20/2014  . Acute medial meniscal tear 02/06/2014  . Low bone mass 12/14/2012  . GAD (generalized anxiety disorder) 12/14/2012  . Vitamin D deficiency 12/14/2012    Zaviyar Rahal,CHRIS, PTA 01/11/2020, 10:00 AM  [facility] [place], [place], [address] Phone: [phone]   Fax:  [phone]  Name: Mary Moss MRN: [id number] Date of Birth: Jun 18, 1936

## 2020-01-15 ENCOUNTER — Ambulatory Visit: Payer: Medicare Other | Admitting: *Deleted

## 2020-01-15 ENCOUNTER — Encounter: Payer: Self-pay | Admitting: *Deleted

## 2020-01-15 ENCOUNTER — Other Ambulatory Visit: Payer: Self-pay

## 2020-01-15 DIAGNOSIS — M25511 Pain in right shoulder: Secondary | ICD-10-CM | POA: Diagnosis not present

## 2020-01-15 DIAGNOSIS — M6281 Muscle weakness (generalized): Secondary | ICD-10-CM

## 2020-01-15 DIAGNOSIS — M25611 Stiffness of right shoulder, not elsewhere classified: Secondary | ICD-10-CM

## 2020-01-15 NOTE — Therapy (Signed)
[facility] [place], [place], [address] Phone: [phone]   Fax:  [phone]  Physical Therapy Treatment  Patient Details  Name: Mary Moss MRN: [id number] Date of Birth: July 31, 1935 Referring Provider (PT): Tania Ade MD   Encounter Date: 01/15/2020   PT End of Session - 01/15/20 0826    Visit Number 12    Number of Visits 12    Date for PT Re-Evaluation 03/07/20    Authorization Type FOTO.   10th vist  46% limitation    PT Start Time 0815    PT Stop Time 0909    PT Time Calculation (min) 54 min           Past Medical History:  Diagnosis Date  . Ankle fracture fx 13 yrs ago, anklw swells off and on, has cramps in ankle also   left, to seee dr hewitt about 02-16-14  . Arthritis   . Basal cell carcinoma 2015   generalized  . GERD (gastroesophageal reflux disease)    OTC med   pt. denies  . Hypertension   . Pneumonia     Past Surgical History:  Procedure Laterality Date  . ABDOMINAL HYSTERECTOMY    . EYE SURGERY Bilateral    bilaterla cataract extraction with IOL  . FRACTURE SURGERY Left 2001  . JOINT REPLACEMENT    . KNEE ARTHROSCOPY Right 02/07/2014   Procedure: RIGHT ARTHROSCOPY KNEE, WITH MEDIAL MENISCAL DEBRIDEMENT AND CHONDRAPLASTY;  Surgeon: Gearlean Alf, MD;  Location: [facility];  Service: Orthopedics;  Laterality: Right;  . ROTATOR CUFF REPAIR Right 2009  . TOTAL ANKLE ARTHROPLASTY Left 12/20/2014   Procedure: LEFT TOTAL ANKLE ARTHOPLASTY WITH PERCUTANEOUS ACHILLES TENDON LENGTHENING;  Surgeon: Wylene Simmer, MD;  Location: [facility];  Service: Orthopedics;  Laterality: Left;  . TOTAL KNEE ARTHROPLASTY Right 06/08/2016   Procedure: RIGHT TOTAL KNEE ARTHROPLASTY;  Surgeon: Gaynelle Arabian, MD;  Location: [facility];  Service: Orthopedics;  Laterality: Right;  . TOTAL SHOULDER ARTHROPLASTY Right 11/23/2019   Procedure: REVERSE TOTAL SHOULDER ARTHROPLASTY;  Surgeon: Tania Ade, MD;  [facility];  Service: Orthopedics;  Laterality: Right;    There were no vitals filed for this visit.   Subjective Assessment - 01/15/20 0820    Subjective COVID-19 screen performed prior to patient entering clinic.  Doing okay in RT shldr. LBP this weekend so didn't get a lot of ex done    Pertinent History right massive rotator cuff tear (irreparable), knee replacement, ankle replacement, HTN,  RT Rev. TSR    Limitations Lifting;House hold activities    Diagnostic tests MRI: massive tear of right rotator cuff    Patient Stated Goals Use right UE without much pain.    Currently in Pain? Yes    Pain Score 2     Pain Location Shoulder    Pain Orientation Right    Pain Descriptors / Indicators Sore    Pain Type Surgical pain    Pain Onset 1 to 4 weeks ago                             [facility] Adult PT Treatment/Exercise - 01/15/20 0001      Exercises   Exercises Shoulder      Shoulder Exercises: Standing   Flexion AAROM;Right;10 reps;20 reps   manual assistance. focus on eccentrics   Extension Strengthening;Right;20 reps;Theraband    Theraband Level (Shoulder Extension) Level 1 (Yellow)  Other Standing Exercises green ball on raised mat table  x 5 mins circles and  pushing it up onto wedge for elevation assist      Shoulder Exercises: Pulleys   Flexion 5 minutes      Shoulder Exercises: ROM/Strengthening   UBE (Upper Arm Bike) 90 RPM x 6 min forward only      Modalities   Modalities Electrical Stimulation;Vasopneumatic      Electrical Stimulation   Electrical Stimulation Location RT shoulder.    Electrical Stimulation Action IFC     Electrical Stimulation Parameters 80-150hz  x 15 mins    Electrical Stimulation Goals Edema;Pain      Vasopneumatic   Number Minutes Vasopneumatic  15 minutes    Vasopnuematic Location  Shoulder    Vasopneumatic Pressure Low    Vasopneumatic Temperature  34      Manual Therapy   Manual therapy comments manual resist 2x10 for ER  and AAROM for elevation in sitting due to LBP                       PT Long Term Goals - 12/21/19 1035      PT LONG TERM GOAL #1   Title Patient will be independent with HEP.    Time 6    Period Weeks    Status Achieved      PT LONG TERM GOAL #2   Title Patient will report ability to perform ADLs with right shoulder pain less than or equal to 4/10.    Time 6    Period Weeks    Status On-going      PT LONG TERM GOAL #3   Title Patient will demonstrate 70= degrees of right shoulder ER AROM to improve ability to don/doff apparel.    Time 6    Period Weeks    Status On-going      PT LONG TERM GOAL #4   Title Patient will demonstrate 140+ degrees of right shoulder flexion AROM to improve ability to perform shoulder height activities.    Time 6    Period Weeks    Status On-going      PT LONG TERM GOAL #5   Title Perform ADL's with pain not > 3/10.    Time 6    Period Weeks    Status On-going                 Plan - 01/15/20 0825    Clinical Impression Statement Pt arrived today doing fairly well with RT shldr pain , but with LBP. Pt did well with therex, but some in sitting due to LBP standing. Normal modality response today.    Comorbidities right massive rotator cuff tear (irreparable), knee replacement, ankle replacement, HTN    Examination-Activity Limitations Dressing;Hygiene/Grooming;Reach Overhead;Other    Examination-Participation Restrictions Cleaning;Other    Stability/Clinical Decision Making Stable/Uncomplicated    Rehab Potential Excellent    PT Frequency 2x / week    PT Duration 6 weeks    PT Treatment/Interventions ADLs/Self Care Home Management;Cryotherapy;Electrical Stimulation;Iontophoresis 4mg /ml Dexamethasone;Moist Heat;Therapeutic exercise;Neuromuscular re-education;Manual techniques;Passive range of motion;Therapeutic activities;Patient/family education;Vasopneumatic Device    PT Next Visit Plan PROM.  Progress per protocol. Work on  Elevation AROM,AAROM, and PROM    Consulted and Agree with Plan of Care Patient           Patient will benefit from skilled therapeutic intervention in order to improve the following deficits and impairments:  Decreased activity tolerance, Decreased strength, Decreased  range of motion, Pain, Impaired UE functional use, Increased edema  Visit Diagnosis: Stiffness of right shoulder, not elsewhere classified  Chronic right shoulder pain  Muscle weakness (generalized)     Problem List Patient Active Problem List   Diagnosis Date Noted  . Status post reverse arthroplasty of right shoulder 11/23/2019  . Morbid obesity ([place]) 11/18/2018  . OA (osteoarthritis) of knee 06/08/2016  . Arthritis of knee, right 01/31/2016  . Hyperlipidemia 09/02/2015  . Pre-diabetes 07/12/2015  . Essential hypertension 07/03/2015  . Post-traumatic arthritis of ankle 12/20/2014  . Acute medial meniscal tear 02/06/2014  . Low bone mass 12/14/2012  . GAD (generalized anxiety disorder) 12/14/2012  . Vitamin D deficiency 12/14/2012    Omari Koslosky,CHRIS, PTA 01/15/2020, 9:14 AM  [facility] [place], [place], [address] Phone: [phone]   Fax:  [phone]  Name: Mary Moss MRN: [id number] Date of Birth: [birth year]

## 2020-01-18 ENCOUNTER — Ambulatory Visit: Payer: Medicare Other | Admitting: *Deleted

## 2020-01-18 ENCOUNTER — Other Ambulatory Visit: Payer: Self-pay

## 2020-01-18 ENCOUNTER — Encounter: Payer: Self-pay | Admitting: *Deleted

## 2020-01-18 DIAGNOSIS — M25511 Pain in right shoulder: Secondary | ICD-10-CM | POA: Diagnosis not present

## 2020-01-18 DIAGNOSIS — M6281 Muscle weakness (generalized): Secondary | ICD-10-CM

## 2020-01-18 DIAGNOSIS — M25611 Stiffness of right shoulder, not elsewhere classified: Secondary | ICD-10-CM

## 2020-01-18 NOTE — Therapy (Signed)
[facility] [place], [place], [address] Phone: [phone]   Fax:  [phone]  Physical Therapy Treatment  Patient Details  Name: Mary Moss MRN: [id number] Date of Birth: 08/23/1935 Referring Provider (PT): Tania Ade MD   Encounter Date: 01/18/2020   PT End of Session - 01/18/20 0826    Visit Number 13    Number of Visits 18    Date for PT Re-Evaluation 03/07/20    Authorization Type FOTO.   10th vist  46% limitation    PT Start Time 0815    PT Stop Time 0905    PT Time Calculation (min) 50 min           Past Medical History:  Diagnosis Date  . Ankle fracture fx 13 yrs ago, anklw swells off and on, has cramps in ankle also   left, to seee dr hewitt about 02-16-14  . Arthritis   . Basal cell carcinoma 2015   generalized  . GERD (gastroesophageal reflux disease)    OTC med   pt. denies  . Hypertension   . Pneumonia     Past Surgical History:  Procedure Laterality Date  . ABDOMINAL HYSTERECTOMY    . EYE SURGERY Bilateral    bilaterla cataract extraction with IOL  . FRACTURE SURGERY Left 2001  . JOINT REPLACEMENT    . KNEE ARTHROSCOPY Right 02/07/2014   Procedure: RIGHT ARTHROSCOPY KNEE, WITH MEDIAL MENISCAL DEBRIDEMENT AND CHONDRAPLASTY;  Surgeon: Gearlean Alf, MD;  Location: [facility];  Service: Orthopedics;  Laterality: Right;  . ROTATOR CUFF REPAIR Right 2009  . TOTAL ANKLE ARTHROPLASTY Left 12/20/2014   Procedure: LEFT TOTAL ANKLE ARTHOPLASTY WITH PERCUTANEOUS ACHILLES TENDON LENGTHENING;  Surgeon: Wylene Simmer, MD;  Location: [facility];  Service: Orthopedics;  Laterality: Left;  . TOTAL KNEE ARTHROPLASTY Right 06/08/2016   Procedure: RIGHT TOTAL KNEE ARTHROPLASTY;  Surgeon: Gaynelle Arabian, MD;  [facility];  Service: Orthopedics;  Laterality: Right;  . TOTAL SHOULDER ARTHROPLASTY Right 11/23/2019   Procedure: REVERSE TOTAL SHOULDER ARTHROPLASTY;  Surgeon: Tania Ade, MD;  [facility];  Service: Orthopedics;  Laterality: Right;    There were no vitals filed for this visit.   Subjective Assessment - 01/18/20 0828    Subjective COVID-19 screen performed prior to patient entering clinic.  Doing okay in RT shldr, but still can't raise it.    Pertinent History right massive rotator cuff tear (irreparable), knee replacement, ankle replacement, HTN,  RT Rev. TSR    Diagnostic tests MRI: massive tear of right rotator cuff    Patient Stated Goals Use right UE without much pain.    Currently in Pain? Yes    Pain Score 2     Pain Location Shoulder    Pain Orientation Right    Pain Descriptors / Indicators Sore    Pain Type Surgical pain                             [facility] Adult PT Treatment/Exercise - 01/18/20 0001      Exercises   Exercises Shoulder      Shoulder Exercises: Supine   Protraction AROM;Right;10 reps;20 reps;AAROM   5x10 with focus on conc/ecc motions   Flexion AAROM;10 reps;20 reps   focus on conc/ecc     Shoulder Exercises: Standing   Flexion AAROM;Right;10 reps;20 reps   manual assistance. focus on eccentrics   Other Standing Exercises green ball on  raised mat table  x 5 mins circles and  pushing it up onto wedge for elevation assist      Shoulder Exercises: Pulleys   Flexion 5 minutes      Shoulder Exercises: ROM/Strengthening   UBE (Upper Arm Bike) 90 RPM x 6 min forward only      Modalities   Modalities --      Manual Therapy   Manual Therapy Passive ROM    Manual therapy comments manual resist 2x10 for ER and AAROM for elevation in sitting due to LBP    Passive ROM In supine:  PROM to patient's right shoulder for  flexion, abduction and ER.  AAROM for hitch hiker  and  upper cut 3 x 10 each in supine, Rhythmic stab  for elevation at 90 degrees as well as 100 degrees.PROM for ER 63 degrees and elevation 120 degrees.                       PT Long Term Goals - 12/21/19 1035      PT LONG TERM GOAL #1   Title  Patient will be independent with HEP.    Time 6    Period Weeks    Status Achieved      PT LONG TERM GOAL #2   Title Patient will report ability to perform ADLs with right shoulder pain less than or equal to 4/10.    Time 6    Period Weeks    Status On-going      PT LONG TERM GOAL #3   Title Patient will demonstrate 70= degrees of right shoulder ER AROM to improve ability to don/doff apparel.    Time 6    Period Weeks    Status On-going      PT LONG TERM GOAL #4   Title Patient will demonstrate 140+ degrees of right shoulder flexion AROM to improve ability to perform shoulder height activities.    Time 6    Period Weeks    Status On-going      PT LONG TERM GOAL #5   Title Perform ADL's with pain not > 3/10.    Time 6    Period Weeks    Status On-going                 Plan - 01/18/20 0913    Clinical Impression Statement Pt arrived today doing fairly well with mild soreness in RT shldr, but her cc was that she can't raise her arm still. Rx focused on RT shldr elevation motion with AROM and AAROM in  standing and supine. No modalities today. PROM 120 degrees.    Comorbidities right massive rotator cuff tear (irreparable), knee replacement, ankle replacement, HTN    Examination-Activity Limitations Dressing;Hygiene/Grooming;Reach Overhead;Other    Stability/Clinical Decision Making Stable/Uncomplicated    Rehab Potential Excellent    PT Frequency 2x / week    PT Duration 6 weeks    PT Treatment/Interventions ADLs/Self Care Home Management;Cryotherapy;Electrical Stimulation;Iontophoresis 4mg /ml Dexamethasone;Moist Heat;Therapeutic exercise;Neuromuscular re-education;Manual techniques;Passive range of motion;Therapeutic activities;Patient/family education;Vasopneumatic Device    PT Next Visit Plan PROM.  Progress per protocol. Work on Elevation AROM,AAROM, and PROM    Consulted and Agree with Plan of Care Patient           Patient will benefit from skilled therapeutic  intervention in order to improve the following deficits and impairments:  Decreased activity tolerance, Decreased strength, Decreased range of motion, Pain, Impaired UE functional use, Increased  edema  Visit Diagnosis: Chronic right shoulder pain  Stiffness of right shoulder, not elsewhere classified  Muscle weakness (generalized)     Problem List Patient Active Problem List   Diagnosis Date Noted  . Status post reverse arthroplasty of right shoulder 11/23/2019  . Morbid obesity ([place]) 11/18/2018  . OA (osteoarthritis) of knee 06/08/2016  . Arthritis of knee, right 01/31/2016  . Hyperlipidemia 09/02/2015  . Pre-diabetes 07/12/2015  . Essential hypertension 07/03/2015  . Post-traumatic arthritis of ankle 12/20/2014  . Acute medial meniscal tear 02/06/2014  . Low bone mass 12/14/2012  . GAD (generalized anxiety disorder) 12/14/2012  . Vitamin D deficiency 12/14/2012    Birgitta Uhlir,CHRIS, PTA 01/18/2020, 9:17 AM  [facility] [place], [place], [address] Phone: [phone]   Fax:  [phone]  Name: Mary Moss MRN: [id number] Date of Birth: 02/03/1936

## 2020-01-22 ENCOUNTER — Encounter: Payer: Self-pay | Admitting: Physical Therapy

## 2020-01-22 ENCOUNTER — Ambulatory Visit: Payer: Medicare Other | Admitting: Physical Therapy

## 2020-01-22 ENCOUNTER — Other Ambulatory Visit: Payer: Self-pay

## 2020-01-22 DIAGNOSIS — M25611 Stiffness of right shoulder, not elsewhere classified: Secondary | ICD-10-CM

## 2020-01-22 DIAGNOSIS — G8929 Other chronic pain: Secondary | ICD-10-CM

## 2020-01-22 DIAGNOSIS — M25511 Pain in right shoulder: Secondary | ICD-10-CM | POA: Diagnosis not present

## 2020-01-22 DIAGNOSIS — M6281 Muscle weakness (generalized): Secondary | ICD-10-CM

## 2020-01-22 NOTE — Therapy (Signed)
[facility] [place], [place], [address] Phone: [phone]   Fax:  [phone]  Physical Therapy Treatment  Patient Details  Name: Mary Moss MRN: [id number] Date of Birth: 11/14/1935 Referring Provider (PT): Tania Ade MD   Encounter Date: 01/22/2020   PT End of Session - 01/22/20 0918    Visit Number 14    Number of Visits 18    Date for PT Re-Evaluation 03/07/20    Authorization Type FOTO.   10th vist  46% limitation    PT Start Time 0815    PT Stop Time 0902    PT Time Calculation (min) 47 min    Activity Tolerance Patient tolerated treatment well    Behavior During Therapy WFL for tasks assessed/performed           Past Medical History:  Diagnosis Date   Ankle fracture fx 13 yrs ago, anklw swells off and on, has cramps in ankle also   left, to seee dr hewitt about 02-16-14   Arthritis    Basal cell carcinoma 2015   generalized   GERD (gastroesophageal reflux disease)    OTC med   pt. denies   Hypertension    Pneumonia     Past Surgical History:  Procedure Laterality Date   ABDOMINAL HYSTERECTOMY     EYE SURGERY Bilateral    bilaterla cataract extraction with IOL   FRACTURE SURGERY Left 2001   JOINT REPLACEMENT     KNEE ARTHROSCOPY Right 02/07/2014   Procedure: RIGHT ARTHROSCOPY KNEE, WITH MEDIAL MENISCAL DEBRIDEMENT AND CHONDRAPLASTY;  Surgeon: Gearlean Alf, MD;  Location: [facility];  Service: Orthopedics;  Laterality: Right;   ROTATOR CUFF REPAIR Right 2009   TOTAL ANKLE ARTHROPLASTY Left 12/20/2014   Procedure: LEFT TOTAL ANKLE ARTHOPLASTY WITH PERCUTANEOUS ACHILLES TENDON LENGTHENING;  Surgeon: Wylene Simmer, MD;  Location: [facility];  Service: Orthopedics;  Laterality: Left;   TOTAL KNEE ARTHROPLASTY Right 06/08/2016   Procedure: RIGHT TOTAL KNEE ARTHROPLASTY;  Surgeon: Gaynelle Arabian, MD;  Location: [facility];  Service: Orthopedics;  Laterality: Right;   TOTAL SHOULDER  ARTHROPLASTY Right 11/23/2019   Procedure: REVERSE TOTAL SHOULDER ARTHROPLASTY;  Surgeon: Tania Ade, MD;  Location: [facility];  Service: Orthopedics;  Laterality: Right;    There were no vitals filed for this visit.   Subjective Assessment - 01/22/20 0857    Subjective COVID-19 screen performed prior to patient entering clinic.  Sore.    Pertinent History right massive rotator cuff tear (irreparable), knee replacement, ankle replacement, HTN,  RT Rev. TSR    Limitations Lifting;House hold activities    Diagnostic tests MRI: massive tear of right rotator cuff    Patient Stated Goals Use right UE without much pain.    Pain Score 3     Pain Location Shoulder    Pain Orientation Right    Pain Onset 1 to 4 weeks ago                             [facility] Adult PT Treatment/Exercise - 01/22/20 0001      Shoulder Exercises: Pulleys   Flexion 5 minutes    Other Pulley Exercises Seated UE ranger x 3 minutes.    Other Pulley Exercises Wall ladder x 5 minutes.      Manual Therapy   Manual Therapy Passive ROM    Passive ROM In supine:  PROM x 25 minutes into lright shoulder flexion, abduction  and ER.                       PT Long Term Goals - 12/21/19 1035      PT LONG TERM GOAL #1   Title Patient will be independent with HEP.    Time 6    Period Weeks    Status Achieved      PT LONG TERM GOAL #2   Title Patient will report ability to perform ADLs with right shoulder pain less than or equal to 4/10.    Time 6    Period Weeks    Status On-going      PT LONG TERM GOAL #3   Title Patient will demonstrate 70= degrees of right shoulder ER AROM to improve ability to don/doff apparel.    Time 6    Period Weeks    Status On-going      PT LONG TERM GOAL #4   Title Patient will demonstrate 140+ degrees of right shoulder flexion AROM to improve ability to perform shoulder height activities.    Time 6    Period Weeks    Status On-going      PT LONG TERM  GOAL #5   Title Perform ADL's with pain not > 3/10.    Time 6    Period Weeks    Status On-going                 Plan - 01/22/20 8675    Clinical Impression Statement The patient quite sore today.  Added wall ladder today.    Personal Factors and Comorbidities Age;Comorbidity 2    Comorbidities right massive rotator cuff tear (irreparable), knee replacement, ankle replacement, HTN    Examination-Participation Restrictions Cleaning;Other    Stability/Clinical Decision Making Stable/Uncomplicated    Rehab Potential Excellent    PT Frequency 2x / week    PT Duration 6 weeks    PT Treatment/Interventions ADLs/Self Care Home Management;Cryotherapy;Electrical Stimulation;Iontophoresis 4mg /ml Dexamethasone;Moist Heat;Therapeutic exercise;Neuromuscular re-education;Manual techniques;Passive range of motion;Therapeutic activities;Patient/family education;Vasopneumatic Device    PT Next Visit Plan PROM.  Progress per protocol. Work on [employer], and PROM    PT Home Exercise Plan see patient education section    Consulted and Agree with Plan of Care Patient           Patient will benefit from skilled therapeutic intervention in order to improve the following deficits and impairments:  Decreased activity tolerance, Decreased strength, Decreased range of motion, Pain, Impaired UE functional use, Increased edema  Visit Diagnosis: Chronic right shoulder pain  Stiffness of right shoulder, not elsewhere classified  Muscle weakness (generalized)     Problem List Patient Active Problem List   Diagnosis Date Noted   Status post reverse arthroplasty of right shoulder 11/23/2019   Morbid obesity ([place]) 11/18/2018   OA (osteoarthritis) of knee 06/08/2016   Arthritis of knee, right 01/31/2016   Hyperlipidemia 09/02/2015   Pre-diabetes 07/12/2015   Essential hypertension 07/03/2015   Post-traumatic arthritis of ankle 12/20/2014   Acute medial meniscal tear 02/06/2014    Low bone mass 12/14/2012   GAD (generalized anxiety disorder) 12/14/2012   Vitamin D deficiency 12/14/2012    Rajat Staver, [place] MPT 01/22/2020, 9:23 AM  [facility] [address] [place], [place], [address] Phone: [phone]   Fax:  [phone]  Name: Mary Moss MRN: [id number] Date of Birth: 1936-01-24

## 2020-01-25 ENCOUNTER — Other Ambulatory Visit: Payer: Self-pay

## 2020-01-25 ENCOUNTER — Ambulatory Visit: Payer: Medicare Other | Attending: Orthopedic Surgery | Admitting: Physical Therapy

## 2020-01-25 DIAGNOSIS — R293 Abnormal posture: Secondary | ICD-10-CM | POA: Diagnosis present

## 2020-01-25 DIAGNOSIS — M6281 Muscle weakness (generalized): Secondary | ICD-10-CM

## 2020-01-25 DIAGNOSIS — M5442 Lumbago with sciatica, left side: Secondary | ICD-10-CM | POA: Diagnosis present

## 2020-01-25 DIAGNOSIS — G8929 Other chronic pain: Secondary | ICD-10-CM

## 2020-01-25 DIAGNOSIS — M25611 Stiffness of right shoulder, not elsewhere classified: Secondary | ICD-10-CM | POA: Diagnosis present

## 2020-01-25 DIAGNOSIS — M25511 Pain in right shoulder: Secondary | ICD-10-CM | POA: Insufficient documentation

## 2020-01-25 NOTE — Therapy (Signed)
[facility] [place], [place], [address] Phone: [phone]   Fax:  [phone]  Physical Therapy Treatment  Patient Details  Name: Mary Moss MRN: [id number] Date of Birth: September 20, 1935 Referring Provider (PT): Tania Ade MD   Encounter Date: 01/25/2020   PT End of Session - 01/25/20 1108    Visit Number 15    Number of Visits 18    Date for PT Re-Evaluation 03/07/20    Authorization Type FOTO.   10th vist  46% limitation    PT Start Time [phone]    PT Stop Time 1028    PT Time Calculation (min) 42 min    Activity Tolerance Patient tolerated treatment well    Behavior During Therapy WFL for tasks assessed/performed           Past Medical History:  Diagnosis Date  . Ankle fracture fx 13 yrs ago, anklw swells off and on, has cramps in ankle also   left, to seee dr hewitt about 02-16-14  . Arthritis   . Basal cell carcinoma 2015   generalized  . GERD (gastroesophageal reflux disease)    OTC med   pt. denies  . Hypertension   . Pneumonia     Past Surgical History:  Procedure Laterality Date  . ABDOMINAL HYSTERECTOMY    . EYE SURGERY Bilateral    bilaterla cataract extraction with IOL  . FRACTURE SURGERY Left 2001  . JOINT REPLACEMENT    . KNEE ARTHROSCOPY Right 02/07/2014   Procedure: RIGHT ARTHROSCOPY KNEE, WITH MEDIAL MENISCAL DEBRIDEMENT AND CHONDRAPLASTY;  Surgeon: Gearlean Alf, MD;  Location: [facility];  Service: Orthopedics;  Laterality: Right;  . ROTATOR CUFF REPAIR Right 2009  . TOTAL ANKLE ARTHROPLASTY Left 12/20/2014   Procedure: LEFT TOTAL ANKLE ARTHOPLASTY WITH PERCUTANEOUS ACHILLES TENDON LENGTHENING;  Surgeon: Wylene Simmer, MD;  Location: [facility];  Service: Orthopedics;  Laterality: Left;  . TOTAL KNEE ARTHROPLASTY Right 06/08/2016   Procedure: RIGHT TOTAL KNEE ARTHROPLASTY;  Surgeon: Gaynelle Arabian, MD;  Location: [facility];  Service: Orthopedics;  Laterality: Right;  . TOTAL SHOULDER ARTHROPLASTY  Right 11/23/2019   Procedure: REVERSE TOTAL SHOULDER ARTHROPLASTY;  Surgeon: Tania Ade, MD;  Location: [facility];  Service: Orthopedics;  Laterality: Right;    There were no vitals filed for this visit.   Subjective Assessment - 01/25/20 0956    Subjective COVID-19 screen performed prior to patient entering clinic.  Patient arrived with ongoing discomfort in shoulder, and unable to lift shoulder    Pertinent History right massive rotator cuff tear (irreparable), knee replacement, ankle replacement, HTN,  RT Rev. TSR    Limitations Lifting;House hold activities    Diagnostic tests MRI: massive tear of right rotator cuff    Patient Stated Goals Use right UE without much pain.    Currently in Pain? Yes    Pain Score 5     Pain Location Shoulder    Pain Orientation Right    Pain Descriptors / Indicators Sore    Pain Type Surgical pain    Pain Onset More than a month ago    Pain Frequency Intermittent    Aggravating Factors  prolong movement    Pain Relieving Factors at rest              [facility] PT Assessment - 01/25/20 0001      ROM / Strength   AROM / PROM / Strength AROM;PROM      AROM   AROM Assessment Site  Shoulder    Right/Left Shoulder Right    Right Shoulder Flexion 70 Degrees    Right Shoulder External Rotation 35 Degrees      PROM   PROM Assessment Site Shoulder    Right/Left Shoulder Right    Right Shoulder Flexion 125 Degrees    Right Shoulder Horizontal ABduction 55 Degrees                         [facility] Adult PT Treatment/Exercise - 01/25/20 0001      Shoulder Exercises: Supine   Protraction AROM;Strengthening;Right   2x10   Other Supine Exercises prayer pose flexion and push 3x10      Shoulder Exercises: Standing   Other Standing Exercises standing prayer pose against wall flexion 2x10      Shoulder Exercises: Pulleys   Flexion 5 minutes    Other Pulley Exercises Seated UE ranger x 3 minutes.      Shoulder Exercises: Isometric  Strengthening   Flexion 5X10"    Extension 5X10"    External Rotation 5X10"    Internal Rotation 5X10"    ABduction 5X10"      Manual Therapy   Manual Therapy Passive ROM    Manual therapy comments rhythmic stabs for flex/ext and abd at 90 degrees    Passive ROM manual PROM for flexion/ER to improve mobility                  PT Education - 01/25/20 1030    Education Details HEP progression    Person(s) Educated Patient    Methods Explanation;Demonstration;Handout    Comprehension Returned demonstration               PT Long Term Goals - 01/25/20 1014      PT LONG TERM GOAL #1   Title Patient will be independent with HEP.    Time 6    Period Weeks    Status Achieved      PT LONG TERM GOAL #2   Title Patient will report ability to perform ADLs with right shoulder pain less than or equal to 4/10.    Time 6    Period Weeks    Status On-going   unable to perform overhead ADL's and pain up to 5-6/10 01/25/20     PT LONG TERM GOAL #3   Title Patient will demonstrate 70= degrees of right shoulder ER AROM to improve ability to don/doff apparel.    Time 6    Period Weeks    Status On-going   AROM 35 degrees 01/25/20     PT LONG TERM GOAL #4   Title Patient will demonstrate 140+ degrees of right shoulder flexion AROM to improve ability to perform shoulder height activities.    Time 6    Period Weeks    Status On-going   AROM 70 degrees 01/25/20     PT LONG TERM GOAL #5   Title Perform ADL's with pain not > 3/10.    Time 6    Period Weeks    Status On-going   unable to perfrom overhead ADL's and pain 5-6/10 01/25/20                Plan - 01/25/20 1111    Clinical Impression Statement Patient tolerated treatment fair due to ongoing soreness and ROM limitation. Patient unable to lift right UE without compensation. patient able to perform AROM supine yet with limitations. Today focused on isometrics with HEP  provided and ROM with rhythmic stabs to improve  mobility and strength in right shoulder. Patient current goals ongoing due to deficts.    Personal Factors and Comorbidities Age;Comorbidity 2    Comorbidities right massive rotator cuff tear (irreparable), knee replacement, ankle replacement, HTN    Examination-Activity Limitations Dressing;Hygiene/Grooming;Reach Overhead;Other    Examination-Participation Restrictions Cleaning;Other    Stability/Clinical Decision Making Stable/Uncomplicated    Rehab Potential Excellent    PT Frequency 2x / week    PT Duration 6 weeks    PT Treatment/Interventions ADLs/Self Care Home Management;Cryotherapy;Electrical Stimulation;Iontophoresis 4mg /ml Dexamethasone;Moist Heat;Therapeutic exercise;Neuromuscular re-education;Manual techniques;Passive range of motion;Therapeutic activities;Patient/family education;Vasopneumatic Device    PT Next Visit Plan cont with POC per protocol/review isometrics for technique, cont with ROM/rhythmic stabs and progress AA to AROM when able    Consulted and Agree with Plan of Care Patient           Patient will benefit from skilled therapeutic intervention in order to improve the following deficits and impairments:  Decreased activity tolerance, Decreased strength, Decreased range of motion, Pain, Impaired UE functional use, Increased edema  Visit Diagnosis: Chronic right shoulder pain  Stiffness of right shoulder, not elsewhere classified  Muscle weakness (generalized)     Problem List Patient Active Problem List   Diagnosis Date Noted  . Status post reverse arthroplasty of right shoulder 11/23/2019  . Morbid obesity ([place]) 11/18/2018  . OA (osteoarthritis) of knee 06/08/2016  . Arthritis of knee, right 01/31/2016  . Hyperlipidemia 09/02/2015  . Pre-diabetes 07/12/2015  . Essential hypertension 07/03/2015  . Post-traumatic arthritis of ankle 12/20/2014  . Acute medial meniscal tear 02/06/2014  . Low bone mass 12/14/2012  . GAD (generalized anxiety disorder)  12/14/2012  . Vitamin D deficiency 12/14/2012    Phillips Climes, PTA 01/25/2020, 11:14 AM  [facility] [place], [place], [address] Phone: [phone]   Fax:  [phone]  Name: Mary Moss MRN: [id number] Date of Birth: 05-18-1936

## 2020-01-25 NOTE — Patient Instructions (Addendum)
Strengthening: Isometric Flexion   Using wall for resistance, press right fist into ball using light pressure. Hold __10__ seconds. Repeat __10__ times per set. Do __2__ sets per session. Do _2-4___ sessions per day.   Strengthening: Isometric Extension   Using wall for resistance, press back of left arm into ball using light pressure. Hold _10___ seconds. Repeat __10__ times per set. Do __2__ sets per session. Do __2-4__ sessions per day.   Strengthening: Isometric External Rotation   Using wall to provide resistance, and keeping right arm at side, press back of hand into ball using light pressure. Hold __10_ seconds. Repeat __10__ times per set. Do _2___ sets per session. Do __2-4__ sessions per day.   Strengthening: Isometric Internal Rotation   Using door frame for resistance, press palm of right hand into ball using light pressure. Keep elbow in at side. Hold _10___ seconds. Repeat __10__ times per set. Do _2___ sets per session. Do __2-4__ sessions per day.   Strengthening: Isometric Abduction    Using wall for resistance, press left arm into ball using light pressure. Hold __10__ seconds. Repeat __10__ times per set. Do _2___ sets per session. Do __1-4__ sessions per day.   ROM: Flexion - Wand (Supine)    Lie on back holding wand. Raise arms over head.  Repeat __10-20__ times per set. Do __2__ sets per session. Do _2-4___ sessions per day.

## 2020-01-30 ENCOUNTER — Other Ambulatory Visit: Payer: Self-pay

## 2020-01-30 ENCOUNTER — Ambulatory Visit: Payer: Medicare Other | Admitting: *Deleted

## 2020-01-30 DIAGNOSIS — R293 Abnormal posture: Secondary | ICD-10-CM

## 2020-01-30 DIAGNOSIS — M6281 Muscle weakness (generalized): Secondary | ICD-10-CM

## 2020-01-30 DIAGNOSIS — G8929 Other chronic pain: Secondary | ICD-10-CM

## 2020-01-30 DIAGNOSIS — M25511 Pain in right shoulder: Secondary | ICD-10-CM | POA: Diagnosis not present

## 2020-01-30 DIAGNOSIS — M25611 Stiffness of right shoulder, not elsewhere classified: Secondary | ICD-10-CM

## 2020-01-30 NOTE — Therapy (Signed)
[facility] [place], [place], [address] Phone: [phone]   Fax:  [phone]  Physical Therapy Treatment  Patient Details  Name: Mary Moss MRN: [id number] Date of Birth: June 20, 1936 Referring Provider (PT): Tania Ade MD   Encounter Date: 01/30/2020   PT End of Session - 01/30/20 1000    Visit Number 16    Number of Visits 18    Date for PT Re-Evaluation 03/07/20    Authorization Type FOTO.   10th vist  46% limitation    PT Start Time 0945    PT Stop Time 1031    PT Time Calculation (min) 46 min           Past Medical History:  Diagnosis Date  . Ankle fracture fx 13 yrs ago, anklw swells off and on, has cramps in ankle also   left, to seee dr hewitt about 02-16-14  . Arthritis   . Basal cell carcinoma 2015   generalized  . GERD (gastroesophageal reflux disease)    OTC med   pt. denies  . Hypertension   . Pneumonia     Past Surgical History:  Procedure Laterality Date  . ABDOMINAL HYSTERECTOMY    . EYE SURGERY Bilateral    bilaterla cataract extraction with IOL  . FRACTURE SURGERY Left 2001  . JOINT REPLACEMENT    . KNEE ARTHROSCOPY Right 02/07/2014   Procedure: RIGHT ARTHROSCOPY KNEE, WITH MEDIAL MENISCAL DEBRIDEMENT AND CHONDRAPLASTY;  Surgeon: Gearlean Alf, MD;  Location: [facility];  Service: Orthopedics;  Laterality: Right;  . ROTATOR CUFF REPAIR Right 2009  . TOTAL ANKLE ARTHROPLASTY Left 12/20/2014   Procedure: LEFT TOTAL ANKLE ARTHOPLASTY WITH PERCUTANEOUS ACHILLES TENDON LENGTHENING;  Surgeon: Wylene Simmer, MD;  Location: [facility];  Service: Orthopedics;  Laterality: Left;  . TOTAL KNEE ARTHROPLASTY Right 06/08/2016   Procedure: RIGHT TOTAL KNEE ARTHROPLASTY;  Surgeon: Gaynelle Arabian, MD;  Location: [facility];  Service: Orthopedics;  Laterality: Right;  . TOTAL SHOULDER ARTHROPLASTY Right 11/23/2019   Procedure: REVERSE TOTAL SHOULDER ARTHROPLASTY;  Surgeon: Tania Ade, MD;  [facility];  Service: Orthopedics;  Laterality: Right;    There were no vitals filed for this visit.   Subjective Assessment - 01/30/20 0959    Subjective COVID-19 screen performed prior to patient entering clinic.  Patient arrived feeling that she is doing a little better    Pertinent History right massive rotator cuff tear (irreparable), knee replacement, ankle replacement, HTN,  RT Rev. TSR    Limitations Lifting;House hold activities    Diagnostic tests MRI: massive tear of right rotator cuff    Patient Stated Goals Use right UE without much pain.    Currently in Pain? Yes    Pain Score 5     Pain Orientation Right    Pain Descriptors / Indicators Sore    Pain Type Surgical pain                             [facility] Adult PT Treatment/Exercise - 01/30/20 0001      Shoulder Exercises: Standing   Internal Rotation Strengthening;Right;20 reps;Theraband   hold 5 secs   Extension Strengthening;Right;Theraband;10 reps   hold 5 secs   Theraband Level (Shoulder Extension) Level 2 (Red)    Row Strengthening;Right;10 reps;Theraband   hold 5 secs   Theraband Level (Shoulder Row) Level 2 (Red)    Other Standing Exercises standing prayer pose against door flexion  2x10      Shoulder Exercises: Pulleys   Flexion 5 minutes      Shoulder Exercises: ROM/Strengthening   UBE (Upper Arm Bike) 90 RPM x 6 min forward only      Manual Therapy   Manual Therapy Passive ROM    Passive ROM manual AAROM for  reaching out in sitting to improve function. 4x 10 with focus on eccentic strengthening                       PT Long Term Goals - 01/25/20 1014      PT LONG TERM GOAL #1   Title Patient will be independent with HEP.    Time 6    Period Weeks    Status Achieved      PT LONG TERM GOAL #2   Title Patient will report ability to perform ADLs with right shoulder pain less than or equal to 4/10.    Time 6    Period Weeks    Status On-going   unable to perform overhead  ADL's and pain up to 5-6/10 01/25/20     PT LONG TERM GOAL #3   Title Patient will demonstrate 70= degrees of right shoulder ER AROM to improve ability to don/doff apparel.    Time 6    Period Weeks    Status On-going   AROM 35 degrees 01/25/20     PT LONG TERM GOAL #4   Title Patient will demonstrate 140+ degrees of right shoulder flexion AROM to improve ability to perform shoulder height activities.    Time 6    Period Weeks    Status On-going   AROM 70 degrees 01/25/20     PT LONG TERM GOAL #5   Title Perform ADL's with pain not > 3/10.    Time 6    Period Weeks    Status On-going   unable to perfrom overhead ADL's and pain 5-6/10 01/25/20                Plan - 01/30/20 1031    Clinical Impression Statement Pt arrived today doing fairly well and feels RT UE is getting stronger, but still can't raise it very high. Rx focused on functional elevation of RT UE with manuaul assitance to raise  and eccentrics for lowering. She also was able to perform red tband for ROWs    Stability/Clinical Decision Making Stable/Uncomplicated    Rehab Potential Excellent    PT Frequency 2x / week    PT Duration 6 weeks    PT Treatment/Interventions ADLs/Self Care Home Management;Cryotherapy;Electrical Stimulation;Iontophoresis 4mg /ml Dexamethasone;Moist Heat;Therapeutic exercise;Neuromuscular re-education;Manual techniques;Passive range of motion;Therapeutic activities;Patient/family education;Vasopneumatic Device    PT Next Visit Plan cont with POC per protocol/review isometrics for technique, cont with ROM/rhythmic stabs and progress AA to AROM when able    PT Home Exercise Plan see patient education section           Patient will benefit from skilled therapeutic intervention in order to improve the following deficits and impairments:  Decreased activity tolerance, Decreased strength, Decreased range of motion, Pain, Impaired UE functional use, Increased edema  Visit Diagnosis: Chronic right  shoulder pain  Stiffness of right shoulder, not elsewhere classified  Muscle weakness (generalized)  Abnormal posture     Problem List Patient Active Problem List   Diagnosis Date Noted  . Status post reverse arthroplasty of right shoulder 11/23/2019  . Morbid obesity ([place]) 11/18/2018  . OA (osteoarthritis) of  knee 06/08/2016  . Arthritis of knee, right 01/31/2016  . Hyperlipidemia 09/02/2015  . Pre-diabetes 07/12/2015  . Essential hypertension 07/03/2015  . Post-traumatic arthritis of ankle 12/20/2014  . Acute medial meniscal tear 02/06/2014  . Low bone mass 12/14/2012  . GAD (generalized anxiety disorder) 12/14/2012  . Vitamin D deficiency 12/14/2012    Aliviyah Malanga,CHRIS, PTA 01/30/2020, 6:13 PM  [facility] [place], [place], [address] Phone: [phone]   Fax:  [phone]  Name: Mary Moss MRN: [id number] Date of Birth: 01-06-1936

## 2020-02-02 ENCOUNTER — Other Ambulatory Visit: Payer: Self-pay

## 2020-02-02 ENCOUNTER — Ambulatory Visit: Payer: Medicare Other | Admitting: Physical Therapy

## 2020-02-02 ENCOUNTER — Encounter: Payer: Self-pay | Admitting: Physical Therapy

## 2020-02-02 DIAGNOSIS — M25511 Pain in right shoulder: Secondary | ICD-10-CM | POA: Diagnosis not present

## 2020-02-02 DIAGNOSIS — M6281 Muscle weakness (generalized): Secondary | ICD-10-CM

## 2020-02-02 DIAGNOSIS — M25611 Stiffness of right shoulder, not elsewhere classified: Secondary | ICD-10-CM

## 2020-02-02 DIAGNOSIS — M5442 Lumbago with sciatica, left side: Secondary | ICD-10-CM

## 2020-02-02 DIAGNOSIS — R293 Abnormal posture: Secondary | ICD-10-CM

## 2020-02-02 NOTE — Therapy (Signed)
[facility] [place], [place], [address] Phone: [phone]   Fax:  [phone]  Physical Therapy Treatment  Patient Details  Name: Mary Moss MRN: [id number] Date of Birth: 22-Jul-1936 Referring Provider (PT): Tania Ade, MD   Encounter Date: 02/02/2020   PT End of Session - 02/02/20 1041    Visit Number 17    Number of Visits 18    Date for PT Re-Evaluation 03/07/20    Authorization Type FOTO.   10th vist  46% limitation    PT Start Time 0945    PT Stop Time 1028    PT Time Calculation (min) 43 min    Activity Tolerance Patient tolerated treatment well    Behavior During Therapy WFL for tasks assessed/performed           Past Medical History:  Diagnosis Date   Ankle fracture fx 13 yrs ago, anklw swells off and on, has cramps in ankle also   left, to seee dr hewitt about 02-16-14   Arthritis    Basal cell carcinoma 2015   generalized   GERD (gastroesophageal reflux disease)    OTC med   pt. denies   Hypertension    Pneumonia     Past Surgical History:  Procedure Laterality Date   ABDOMINAL HYSTERECTOMY     EYE SURGERY Bilateral    bilaterla cataract extraction with IOL   FRACTURE SURGERY Left 2001   JOINT REPLACEMENT     KNEE ARTHROSCOPY Right 02/07/2014   Procedure: RIGHT ARTHROSCOPY KNEE, WITH MEDIAL MENISCAL DEBRIDEMENT AND CHONDRAPLASTY;  Surgeon: Gearlean Alf, MD;  Location: [facility];  Service: Orthopedics;  Laterality: Right;   ROTATOR CUFF REPAIR Right 2009   TOTAL ANKLE ARTHROPLASTY Left 12/20/2014   Procedure: LEFT TOTAL ANKLE ARTHOPLASTY WITH PERCUTANEOUS ACHILLES TENDON LENGTHENING;  Surgeon: Wylene Simmer, MD;  Location: [facility];  Service: Orthopedics;  Laterality: Left;   TOTAL KNEE ARTHROPLASTY Right 06/08/2016   Procedure: RIGHT TOTAL KNEE ARTHROPLASTY;  Surgeon: Gaynelle Arabian, MD;  Location: [facility];  Service: Orthopedics;  Laterality: Right;   TOTAL SHOULDER  ARTHROPLASTY Right 11/23/2019   Procedure: REVERSE TOTAL SHOULDER ARTHROPLASTY;  Surgeon: Tania Ade, MD;  Location: [facility];  Service: Orthopedics;  Laterality: Right;    There were no vitals filed for this visit.   Subjective Assessment - 02/02/20 1024    Subjective COVID-19 screen performed prior to patient entering clinic.  Patient stating she feels like she is not making progress with being able to lift it herself.    Pertinent History right massive rotator cuff tear (irreparable), knee replacement, ankle replacement, HTN,  RT Rev. TSR    Limitations Lifting;House hold activities    Diagnostic tests MRI: massive tear of right rotator cuff    Patient Stated Goals Use right UE without much pain.    Currently in Pain? Yes    Pain Score 4     Pain Location Shoulder    Pain Orientation Right    Pain Descriptors / Indicators Sore              [facility] PT Assessment - 02/02/20 0001      Assessment   Medical Diagnosis R reverse shoulder    Referring Provider (PT) Tania Ade, MD    Onset Date/Surgical Date 11/23/19    Hand Dominance Right    Next MD Visit 02/16/2020      Precautions   Precaution Comments sling discharged      AROM  AROM Assessment Site Shoulder    Right/Left Shoulder Right    Right Shoulder Flexion 70 Degrees    Right Shoulder External Rotation 35 Degrees      PROM   PROM Assessment Site Shoulder    Right/Left Shoulder Right    Right Shoulder Flexion 140 Degrees                         [facility] Adult PT Treatment/Exercise - 02/02/20 0001      Shoulder Exercises: Standing   Extension Strengthening;Right;Theraband;10 reps   hold 5 secs   Theraband Level (Shoulder Extension) Level 2 (Red)    Row Strengthening;Right;10 reps;Theraband   hold 5 secs   Theraband Level (Shoulder Row) Level 2 (Red)    Other Standing Exercises wall ladder      Shoulder Exercises: Pulleys   Flexion 5 minutes      Shoulder Exercises: Stretch   Table  Stretch - Flexion 5 reps;10 seconds    Table Stretch -Flexion Limitations 2 sets    Table Stretch - External Rotation 5 reps;10 seconds    Table Stretch - External Rotation Limitations 2 sets    Other Shoulder Stretches table slides scaption x 10 holding 5 seconds each      Manual Therapy   Manual Therapy Passive ROM    Passive ROM Flexion, ABD, ER and IR                       PT Long Term Goals - 02/02/20 1217      PT LONG TERM GOAL #1   Title Patient will be independent with HEP.    Status Achieved      PT LONG TERM GOAL #2   Title Patient will report ability to perform ADLs with right shoulder pain less than or equal to 4/10.    Status On-going      PT LONG TERM GOAL #3   Title Patient will demonstrate 70= degrees of right shoulder ER AROM to improve ability to don/doff apparel.    Status On-going      PT LONG TERM GOAL #4   Title Patient will demonstrate 140+ degrees of right shoulder flexion AROM to improve ability to perform shoulder height activities.    Status On-going      PT LONG TERM GOAL #5   Title Perform ADL's with pain not > 3/10.    Period Weeks    Status On-going                 Plan - 02/02/20 1041    Clinical Impression Statement Pt arriving to therapy reporting more soreness of 4/10. Pt presenting with limitations in active rom with R shoulder fleixon to 70 degrees. PROM today in R flexion was 140 degrees with painful endranges. Continue to progress per protocol toward LTG's, Next visit pt will need re-cert.    Personal Factors and Comorbidities Age;Comorbidity 2    Comorbidities right massive rotator cuff tear (irreparable), knee replacement, ankle replacement, HTN    Examination-Activity Limitations Dressing;Hygiene/Grooming;Reach Overhead;Other    Examination-Participation Restrictions Cleaning;Other    Stability/Clinical Decision Making Stable/Uncomplicated    Rehab Potential Excellent    PT Frequency 2x / week    PT Duration 6  weeks    PT Treatment/Interventions ADLs/Self Care Home Management;Cryotherapy;Electrical Stimulation;Iontophoresis 4mg /ml Dexamethasone;Moist Heat;Therapeutic exercise;Neuromuscular re-education;Manual techniques;Passive range of motion;Therapeutic activities;Patient/family education;Vasopneumatic Device    PT Next Visit Plan Re-cert: cont with POC per  protocol/review isometrics for technique, cont with ROM/rhythmic stabs and progress AA to AROM when able    Consulted and Agree with Plan of Care Patient           Patient will benefit from skilled therapeutic intervention in order to improve the following deficits and impairments:  Decreased activity tolerance, Decreased strength, Decreased range of motion, Pain, Impaired UE functional use, Increased edema  Visit Diagnosis: Chronic right shoulder pain  Stiffness of right shoulder, not elsewhere classified  Muscle weakness (generalized)  Abnormal posture  Acute left-sided low back pain with left-sided sciatica     Problem List Patient Active Problem List   Diagnosis Date Noted   Status post reverse arthroplasty of right shoulder 11/23/2019   Morbid obesity ([place]) 11/18/2018   OA (osteoarthritis) of knee 06/08/2016   Arthritis of knee, right 01/31/2016   Hyperlipidemia 09/02/2015   Pre-diabetes 07/12/2015   Essential hypertension 07/03/2015   Post-traumatic arthritis of ankle 12/20/2014   Acute medial meniscal tear 02/06/2014   Low bone mass 12/14/2012   GAD (generalized anxiety disorder) 12/14/2012   Vitamin D deficiency 12/14/2012    Oretha Caprice, PT, MPT 02/02/2020, 12:22 PM  [facility] [address] [place], [place], [address] Phone: [phone]   Fax:  [phone]  Name: KYOMI HECTOR MRN: [id number] Date of Birth: [birth year]

## 2020-02-06 ENCOUNTER — Other Ambulatory Visit: Payer: Self-pay

## 2020-02-06 ENCOUNTER — Ambulatory Visit: Payer: Medicare Other | Admitting: Physical Therapy

## 2020-02-06 DIAGNOSIS — M25511 Pain in right shoulder: Secondary | ICD-10-CM | POA: Diagnosis not present

## 2020-02-06 DIAGNOSIS — M6281 Muscle weakness (generalized): Secondary | ICD-10-CM

## 2020-02-06 DIAGNOSIS — M25611 Stiffness of right shoulder, not elsewhere classified: Secondary | ICD-10-CM

## 2020-02-06 NOTE — Therapy (Signed)
[facility] [place], [place], [address] Phone: [phone]   Fax:  [phone]  Physical Therapy Treatment  Patient Details  Name: Mary Moss MRN: [id number] Date of Birth: [birth year] Referring Provider (PT): Tania Ade, MD   Encounter Date: 02/06/2020   PT End of Session - 02/06/20 0935    Visit Number 18    Number of Visits 24    Date for PT Re-Evaluation 03/07/20    Authorization Type FOTO.   10th vist  46% limitation    PT Start Time 0815    PT Stop Time 0902    PT Time Calculation (min) 47 min    Activity Tolerance Patient tolerated treatment well    Behavior During Therapy WFL for tasks assessed/performed           Past Medical History:  Diagnosis Date  . Ankle fracture fx 13 yrs ago, anklw swells off and on, has cramps in ankle also   left, to seee dr hewitt about 02-16-14  . Arthritis   . Basal cell carcinoma 2015   generalized  . GERD (gastroesophageal reflux disease)    OTC med   pt. denies  . Hypertension   . Pneumonia     Past Surgical History:  Procedure Laterality Date  . ABDOMINAL HYSTERECTOMY    . EYE SURGERY Bilateral    bilaterla cataract extraction with IOL  . FRACTURE SURGERY Left 2001  . JOINT REPLACEMENT    . KNEE ARTHROSCOPY Right 02/07/2014   Procedure: RIGHT ARTHROSCOPY KNEE, WITH MEDIAL MENISCAL DEBRIDEMENT AND CHONDRAPLASTY;  Surgeon: Gearlean Alf, MD;  Location: [facility];  Service: Orthopedics;  Laterality: Right;  . ROTATOR CUFF REPAIR Right 2009  . TOTAL ANKLE ARTHROPLASTY Left 12/20/2014   Procedure: LEFT TOTAL ANKLE ARTHOPLASTY WITH PERCUTANEOUS ACHILLES TENDON LENGTHENING;  Surgeon: Wylene Simmer, MD;  Location: [facility];  Service: Orthopedics;  Laterality: Left;  . TOTAL KNEE ARTHROPLASTY Right 06/08/2016   Procedure: RIGHT TOTAL KNEE ARTHROPLASTY;  Surgeon: Gaynelle Arabian, MD;  Location: [facility];  Service: Orthopedics;  Laterality: Right;  . TOTAL SHOULDER  ARTHROPLASTY Right 11/23/2019   Procedure: REVERSE TOTAL SHOULDER ARTHROPLASTY;  Surgeon: Tania Ade, MD;  Location: [facility];  Service: Orthopedics;  Laterality: Right;    There were no vitals filed for this visit.   Subjective Assessment - 02/06/20 0936    Subjective COVID-19 screen performed prior to patient entering clinic.  Muscle really grabbing today.    Pertinent History right massive rotator cuff tear (irreparable), knee replacement, ankle replacement, HTN,  RT Rev. TSR    Limitations Lifting;House hold activities    Diagnostic tests MRI: massive tear of right rotator cuff    Patient Stated Goals Use right UE without much pain.    Currently in Pain? Yes    Pain Score 5     Pain Location Shoulder    Pain Orientation Right    Pain Descriptors / Indicators Sore    Pain Type Surgical pain    Pain Onset More than a month ago                             [facility] Adult PT Treatment/Exercise - 02/06/20 0001      Shoulder Exercises: Pulleys   Flexion --   7 minutes.   Other Pulley Exercises Seated Ranger x 4 minutes.      Modalities   Modalities Vasopneumatic  Vasopneumatic   Number Minutes Vasopneumatic  15 minutes    Vasopnuematic Location  --   Right shoulder.  Pillow btw elbow and thorax.   Vasopneumatic Pressure Low      Manual Therapy   Manual Therapy Soft tissue mobilization    Soft tissue mobilization STW/M/IASTM x 12 minutes to patient's right middle deltoid and posterior cuff region to reduce tone due to TP's.                       PT Long Term Goals - 02/02/20 1217      PT LONG TERM GOAL #1   Title Patient will be independent with HEP.    Status Achieved      PT LONG TERM GOAL #2   Title Patient will report ability to perform ADLs with right shoulder pain less than or equal to 4/10.    Status On-going      PT LONG TERM GOAL #3   Title Patient will demonstrate 70= degrees of right shoulder ER AROM to improve ability to  don/doff apparel.    Status On-going      PT LONG TERM GOAL #4   Title Patient will demonstrate 140+ degrees of right shoulder flexion AROM to improve ability to perform shoulder height activities.    Status On-going      PT LONG TERM GOAL #5   Title Perform ADL's with pain not > 3/10.    Period Weeks    Status On-going                 Plan - 02/06/20 0939    Clinical Impression Statement Patient with trigger points over right middle deltoid and posterior cuff.  She did very well with STW/M today and her shoulder felt better following.    Personal Factors and Comorbidities Age;Comorbidity 2    Comorbidities right massive rotator cuff tear (irreparable), knee replacement, ankle replacement, HTN    Examination-Activity Limitations Dressing;Hygiene/Grooming;Reach Overhead;Other    Examination-Participation Restrictions Cleaning;Other    Stability/Clinical Decision Making Stable/Uncomplicated    Rehab Potential Excellent    PT Frequency 2x / week    PT Duration 6 weeks    PT Treatment/Interventions ADLs/Self Care Home Management;Cryotherapy;Electrical Stimulation;Iontophoresis 4mg /ml Dexamethasone;Moist Heat;Therapeutic exercise;Neuromuscular re-education;Manual techniques;Passive range of motion;Therapeutic activities;Patient/family education;Vasopneumatic Device    PT Next Visit Plan Re-cert: cont with POC per protocol/review isometrics for technique, cont with ROM/rhythmic stabs and progress AA to AROM when able    PT Home Exercise Plan see patient education section           Patient will benefit from skilled therapeutic intervention in order to improve the following deficits and impairments:  Decreased activity tolerance, Decreased strength, Decreased range of motion, Pain, Impaired UE functional use, Increased edema  Visit Diagnosis: Chronic right shoulder pain - Plan: PT plan of care cert/re-cert  Stiffness of right shoulder, not elsewhere classified - Plan: PT plan of  care cert/re-cert  Muscle weakness (generalized) - Plan: PT plan of care cert/re-cert     Problem List Patient Active Problem List   Diagnosis Date Noted  . Status post reverse arthroplasty of right shoulder 11/23/2019  . Morbid obesity ([place]) 11/18/2018  . OA (osteoarthritis) of knee 06/08/2016  . Arthritis of knee, right 01/31/2016  . Hyperlipidemia 09/02/2015  . Pre-diabetes 07/12/2015  . Essential hypertension 07/03/2015  . Post-traumatic arthritis of ankle 12/20/2014  . Acute medial meniscal tear 02/06/2014  . Low bone mass 12/14/2012  . GAD (  generalized anxiety disorder) 12/14/2012  . Vitamin D deficiency 12/14/2012    Geraldyne Barraclough, [place] MPT 02/06/2020, 9:41 AM  [facility] [address] [place], [place], [address] Phone: [phone]   Fax:  [phone]  Name: Mary Moss MRN: [id number] Date of Birth: 09-16-1935

## 2020-02-07 ENCOUNTER — Other Ambulatory Visit: Payer: Self-pay | Admitting: Family

## 2020-02-07 DIAGNOSIS — I10 Essential (primary) hypertension: Secondary | ICD-10-CM

## 2020-02-08 ENCOUNTER — Ambulatory Visit: Payer: Medicare Other | Admitting: *Deleted

## 2020-02-08 ENCOUNTER — Other Ambulatory Visit: Payer: Self-pay

## 2020-02-08 DIAGNOSIS — G8929 Other chronic pain: Secondary | ICD-10-CM

## 2020-02-08 DIAGNOSIS — M25511 Pain in right shoulder: Secondary | ICD-10-CM | POA: Diagnosis not present

## 2020-02-08 DIAGNOSIS — M25611 Stiffness of right shoulder, not elsewhere classified: Secondary | ICD-10-CM

## 2020-02-08 DIAGNOSIS — M6281 Muscle weakness (generalized): Secondary | ICD-10-CM

## 2020-02-08 NOTE — Therapy (Signed)
[facility] [place], [place], [address] Phone: [phone]   Fax:  [phone]  Physical Therapy Treatment  Patient Details  Name: Mary Moss MRN: [id number] Date of Birth: 03-08-1936 Referring Provider (PT): Tania Ade, MD   Encounter Date: 02/08/2020   PT End of Session - 02/08/20 0941    Visit Number 19    Number of Visits 24    Date for PT Re-Evaluation 03/07/20    Authorization Type FOTO.   10th vist  46% limitation    PT Start Time 0815    PT Stop Time 0905    PT Time Calculation (min) 50 min           Past Medical History:  Diagnosis Date  . Ankle fracture fx 13 yrs ago, anklw swells off and on, has cramps in ankle also   left, to seee dr hewitt about 02-16-14  . Arthritis   . Basal cell carcinoma 2015   generalized  . GERD (gastroesophageal reflux disease)    OTC med   pt. denies  . Hypertension   . Pneumonia     Past Surgical History:  Procedure Laterality Date  . ABDOMINAL HYSTERECTOMY    . EYE SURGERY Bilateral    bilaterla cataract extraction with IOL  . FRACTURE SURGERY Left 2001  . JOINT REPLACEMENT    . KNEE ARTHROSCOPY Right 02/07/2014   Procedure: RIGHT ARTHROSCOPY KNEE, WITH MEDIAL MENISCAL DEBRIDEMENT AND CHONDRAPLASTY;  Surgeon: Gearlean Alf, MD;  Location: [facility];  Service: Orthopedics;  Laterality: Right;  . ROTATOR CUFF REPAIR Right 2009  . TOTAL ANKLE ARTHROPLASTY Left 12/20/2014   Procedure: LEFT TOTAL ANKLE ARTHOPLASTY WITH PERCUTANEOUS ACHILLES TENDON LENGTHENING;  Surgeon: Wylene Simmer, MD;  Location: [facility];  Service: Orthopedics;  Laterality: Left;  . TOTAL KNEE ARTHROPLASTY Right 06/08/2016   Procedure: RIGHT TOTAL KNEE ARTHROPLASTY;  Surgeon: Gaynelle Arabian, MD;  Location: [facility];  Service: Orthopedics;  Laterality: Right;  . TOTAL SHOULDER ARTHROPLASTY Right 11/23/2019   Procedure: REVERSE TOTAL SHOULDER ARTHROPLASTY;  Surgeon: Tania Ade, MD;  [facility];  Service: Orthopedics;  Laterality: Right;    There were no vitals filed for this visit.   Subjective Assessment - 02/08/20 0831    Subjective COVID-19 screen performed prior to patient entering clinic.  To MD next Wed.    Pertinent History right massive rotator cuff tear (irreparable), knee replacement, ankle replacement, HTN,  RT Rev. TSR    Limitations Lifting;House hold activities    Diagnostic tests MRI: massive tear of right rotator cuff    Patient Stated Goals Use right UE without much pain.    Currently in Pain? Yes    Pain Score 5     Pain Location Shoulder    Pain Orientation Right    Pain Descriptors / Indicators Sore    Pain Type Surgical pain                             [facility] Adult PT Treatment/Exercise - 02/08/20 0001      Shoulder Exercises: Standing   Protraction Strengthening;Right;20 reps;Theraband    Theraband Level (Shoulder Protraction) Level 2 (Red)    Extension Strengthening;Right;Theraband;10 reps   hold 5 secs   Theraband Level (Shoulder Extension) Level 2 (Red)    Row Strengthening;Right;10 reps;Theraband   hold 5 secs   Theraband Level (Shoulder Row) Level 2 (Red)    Other Standing Exercises clasped  hands and elbows on wall with elevation x 5      Shoulder Exercises: Pulleys   Flexion 3 minutes   focus on eccentrics     Modalities   Modalities Vasopneumatic      Vasopneumatic   Number Minutes Vasopneumatic  15 minutes    Vasopnuematic Location  Shoulder   Right shoulder.  Pillow btw elbow and thorax.   Vasopneumatic Pressure Low    Vasopneumatic Temperature  34      Manual Therapy   Manual Therapy Passive ROM    Passive ROM AAROM and PROM for flexion insitting focus on eccentrics with AAROM seated                       PT Long Term Goals - 02/02/20 1217      PT LONG TERM GOAL #1   Title Patient will be independent with HEP.    Status Achieved      PT LONG TERM GOAL #2   Title Patient will report  ability to perform ADLs with right shoulder pain less than or equal to 4/10.    Status On-going      PT LONG TERM GOAL #3   Title Patient will demonstrate 70= degrees of right shoulder ER AROM to improve ability to don/doff apparel.    Status On-going      PT LONG TERM GOAL #4   Title Patient will demonstrate 140+ degrees of right shoulder flexion AROM to improve ability to perform shoulder height activities.    Status On-going      PT LONG TERM GOAL #5   Title Perform ADL's with pain not > 3/10.    Period Weeks    Status On-going                 Plan - 02/08/20 0941    Clinical Impression Statement Pt arrived doing fairly well with decreased soreness RT shldr and was able to complete AROM, AAROM as well as strengthening exs today. Rx focused on facilitation for elevaton RT UE which is still very challenging for Pt. Normal vaso response today    Comorbidities right massive rotator cuff tear (irreparable), knee replacement, ankle replacement, HTN    Examination-Activity Limitations Dressing;Hygiene/Grooming;Reach Overhead;Other    Stability/Clinical Decision Making Stable/Uncomplicated    Rehab Potential Excellent    PT Treatment/Interventions ADLs/Self Care Home Management;Cryotherapy;Electrical Stimulation;Iontophoresis 4mg /ml Dexamethasone;Moist Heat;Therapeutic exercise;Neuromuscular re-education;Manual techniques;Passive range of motion;Therapeutic activities;Patient/family education;Vasopneumatic Device    PT Next Visit Plan Re-cert: cont with POC per protocol    PT Home Exercise Plan see patient education section    Consulted and Agree with Plan of Care Patient           Patient will benefit from skilled therapeutic intervention in order to improve the following deficits and impairments:     Visit Diagnosis: Chronic right shoulder pain  Stiffness of right shoulder, not elsewhere classified  Muscle weakness (generalized)     Problem List Patient Active  Problem List   Diagnosis Date Noted  . Status post reverse arthroplasty of right shoulder 11/23/2019  . Morbid obesity ([place]) 11/18/2018  . OA (osteoarthritis) of knee 06/08/2016  . Arthritis of knee, right 01/31/2016  . Hyperlipidemia 09/02/2015  . Pre-diabetes 07/12/2015  . Essential hypertension 07/03/2015  . Post-traumatic arthritis of ankle 12/20/2014  . Acute medial meniscal tear 02/06/2014  . Low bone mass 12/14/2012  . GAD (generalized anxiety disorder) 12/14/2012  . Vitamin D deficiency 12/14/2012  Kazimierz Springborn,CHRIS, PTA 02/08/2020, 9:45 AM  [facility] [place], [place], [address] Phone: [phone]   Fax:  [phone]  Name: Mary Moss MRN: [id number] Date of Birth: [birth year]

## 2020-02-12 ENCOUNTER — Ambulatory Visit: Payer: Medicare Other | Admitting: Physical Therapy

## 2020-02-12 ENCOUNTER — Other Ambulatory Visit: Payer: Self-pay

## 2020-02-12 DIAGNOSIS — M25511 Pain in right shoulder: Secondary | ICD-10-CM | POA: Diagnosis not present

## 2020-02-12 DIAGNOSIS — M25611 Stiffness of right shoulder, not elsewhere classified: Secondary | ICD-10-CM

## 2020-02-12 DIAGNOSIS — M6281 Muscle weakness (generalized): Secondary | ICD-10-CM

## 2020-02-12 DIAGNOSIS — G8929 Other chronic pain: Secondary | ICD-10-CM

## 2020-02-12 NOTE — Therapy (Signed)
[facility] [place], [place], [address] Phone: [phone]   Fax:  [phone]  Physical Therapy Treatment  Patient Details  Name: Mary Moss MRN: [id number] Date of Birth: 1935/11/27 Referring Provider (PT): Tania Ade, MD   Encounter Date: 02/12/2020   PT End of Session - 02/12/20 0801    Visit Number 20    Number of Visits 24    Date for PT Re-Evaluation 03/07/20    Authorization Type FOTO.   20th vist  41% limitation    PT Start Time 0730    PT Stop Time 0814    PT Time Calculation (min) 44 min    Activity Tolerance Patient tolerated treatment well    Behavior During Therapy [facility] for tasks assessed/performed           Past Medical History:  Diagnosis Date  . Ankle fracture fx 13 yrs ago, anklw swells off and on, has cramps in ankle also   left, to seee dr hewitt about 02-16-14  . Arthritis   . Basal cell carcinoma 2015   generalized  . GERD (gastroesophageal reflux disease)    OTC med   pt. denies  . Hypertension   . Pneumonia     Past Surgical History:  Procedure Laterality Date  . ABDOMINAL HYSTERECTOMY    . EYE SURGERY Bilateral    bilaterla cataract extraction with IOL  . FRACTURE SURGERY Left 2001  . JOINT REPLACEMENT    . KNEE ARTHROSCOPY Right 02/07/2014   Procedure: RIGHT ARTHROSCOPY KNEE, WITH MEDIAL MENISCAL DEBRIDEMENT AND CHONDRAPLASTY;  Surgeon: Gearlean Alf, MD;  Location: [facility];  Service: Orthopedics;  Laterality: Right;  . ROTATOR CUFF REPAIR Right 2009  . TOTAL ANKLE ARTHROPLASTY Left 12/20/2014   Procedure: LEFT TOTAL ANKLE ARTHOPLASTY WITH PERCUTANEOUS ACHILLES TENDON LENGTHENING;  Surgeon: Wylene Simmer, MD;  Location: [place];  Service: Orthopedics;  Laterality: Left;  . TOTAL KNEE ARTHROPLASTY Right 06/08/2016   Procedure: RIGHT TOTAL KNEE ARTHROPLASTY;  Surgeon: Gaynelle Arabian, MD;  [facility];  Service: Orthopedics;  Laterality: Right;  . TOTAL SHOULDER  ARTHROPLASTY Right 11/23/2019   Procedure: REVERSE TOTAL SHOULDER ARTHROPLASTY;  Surgeon: Tania Ade, MD;  [facility];  Service: Orthopedics;  Laterality: Right;    There were no vitals filed for this visit.   Subjective Assessment - 02/12/20 0739    Subjective COVID-19 screen performed prior to patient entering clinic.  Patient arrived with ongoing difficulty with any movement overhead, going to MD next monday    Pertinent History right massive rotator cuff tear (irreparable), knee replacement, ankle replacement, HTN,  RT Rev. TSR    Limitations Lifting;House hold activities    Diagnostic tests MRI: massive tear of right rotator cuff    Patient Stated Goals Use right UE without much pain.    Currently in Pain? Yes    Pain Score 4     Pain Location Shoulder    Pain Orientation Right    Pain Descriptors / Indicators Sore    Pain Type Surgical pain    Pain Onset More than a month ago    Pain Frequency Intermittent    Aggravating Factors  prolong movement esp overhead    Pain Relieving Factors at rest              [facility] PT Assessment - 02/12/20 0001      AROM   AROM Assessment Site Shoulder    Right/Left Shoulder Right    Right Shoulder  Flexion 100 Degrees    Right Shoulder External Rotation 56 Degrees      PROM   Right Shoulder Flexion 140 Degrees    Right Shoulder Horizontal ABduction 70 Degrees                         [facility] Adult PT Treatment/Exercise - 02/12/20 0001      Shoulder Exercises: Standing   Protraction Strengthening;Right;20 reps;Theraband    Theraband Level (Shoulder Protraction) Level 2 (Red)    Retraction Strengthening;Right;20 reps;Theraband    Theraband Level (Shoulder Retraction) Level 2 (Red)      Shoulder Exercises: Pulleys   Flexion 5 minutes    Other Pulley Exercises wall ladder x81min      Vasopneumatic   Number Minutes Vasopneumatic  15 minutes    Vasopnuematic Location  Shoulder    Vasopneumatic Pressure Low     Vasopneumatic Temperature  34      Manual Therapy   Manual Therapy Passive ROM    Passive ROM manual PROM for measurements, rhythmic stabs in scaption for ER/IR and flex,ext, abd at 90 degrees, eccentric lowering for flexion                       PT Long Term Goals - 02/12/20 0746      PT LONG TERM GOAL #1   Title Patient will be independent with HEP.    Time 6    Period Weeks    Status Achieved      PT LONG TERM GOAL #2   Title Patient will report ability to perform ADLs with right shoulder pain less than or equal to 4/10.    Time 6    Period Weeks    Status On-going   able to perform most ADL's yet limited with overhead movement 02/12/20     PT LONG TERM GOAL #3   Title Patient will demonstrate 70= degrees of right shoulder ER AROM to improve ability to don/doff apparel.    Time 6    Period Weeks    Status On-going      PT LONG TERM GOAL #4   Title Patient will demonstrate 140+ degrees of right shoulder flexion AROM to improve ability to perform shoulder height activities.    Baseline AROM 100 degrees 02/12/20    Time 6    Period Weeks    Status On-going      PT LONG TERM GOAL #5   Title Perform ADL's with pain not > 3/10.    Time 6    Period Weeks    Status On-going                 Plan - 02/12/20 0802    Clinical Impression Statement Patient tolerated treatment well today with ongoing limitations with any AROM overhead. Patient limited to 100 degrees flexion in sitting and passively ROM is WNL. Today focused on supine eccentrics for flexion and rhythmic stabs to improve strengthening. Ptiant current goals ongoing due to limitations.    Personal Factors and Comorbidities Age;Comorbidity 2    Comorbidities right massive rotator cuff tear (irreparable), knee replacement, ankle replacement, HTN    Examination-Activity Limitations Dressing;Hygiene/Grooming;Reach Overhead;Other    Examination-Participation Restrictions Cleaning;Other     Stability/Clinical Decision Making Stable/Uncomplicated    Rehab Potential Excellent    PT Frequency 2x / week    PT Duration 6 weeks    PT Treatment/Interventions ADLs/Self Care Home Management;Cryotherapy;Electrical Stimulation;Iontophoresis 4mg /ml  Dexamethasone;Moist Heat;Therapeutic exercise;Neuromuscular re-education;Manual techniques;Passive range of motion;Therapeutic activities;Patient/family education;Vasopneumatic Device    PT Next Visit Plan cont per POC per protocol and MD note for 02/19/20    Consulted and Agree with Plan of Care Patient           Patient will benefit from skilled therapeutic intervention in order to improve the following deficits and impairments:  Decreased activity tolerance, Decreased strength, Decreased range of motion, Pain, Impaired UE functional use, Increased edema  Visit Diagnosis: Chronic right shoulder pain  Stiffness of right shoulder, not elsewhere classified  Muscle weakness (generalized)     Problem List Patient Active Problem List   Diagnosis Date Noted  . Status post reverse arthroplasty of right shoulder 11/23/2019  . Morbid obesity ([place]) 11/18/2018  . OA (osteoarthritis) of knee 06/08/2016  . Arthritis of knee, right 01/31/2016  . Hyperlipidemia 09/02/2015  . Pre-diabetes 07/12/2015  . Essential hypertension 07/03/2015  . Post-traumatic arthritis of ankle 12/20/2014  . Acute medial meniscal tear 02/06/2014  . Low bone mass 12/14/2012  . GAD (generalized anxiety disorder) 12/14/2012  . Vitamin D deficiency 12/14/2012    Ladean Raya, PTA 02/12/20 8:18 AM   [facility] [place], [place], [address] Phone: [phone]   Fax:  [phone]  Name: Mary Moss MRN: [id number] Date of Birth: 26-Oct-1935  Progress Note Reporting Period 12/08/19 to 02/12/20  See note below for Objective Data and Assessment of Progress/Goals.  Patient progressing well per  protocol.  Goals ongoing.    Mali Applegate MPT

## 2020-02-15 ENCOUNTER — Ambulatory Visit: Payer: Medicare Other | Admitting: Physical Therapy

## 2020-02-15 ENCOUNTER — Encounter: Payer: Self-pay | Admitting: Physical Therapy

## 2020-02-15 ENCOUNTER — Other Ambulatory Visit: Payer: Self-pay

## 2020-02-15 DIAGNOSIS — M25511 Pain in right shoulder: Secondary | ICD-10-CM | POA: Diagnosis not present

## 2020-02-15 DIAGNOSIS — M25611 Stiffness of right shoulder, not elsewhere classified: Secondary | ICD-10-CM

## 2020-02-15 DIAGNOSIS — G8929 Other chronic pain: Secondary | ICD-10-CM

## 2020-02-15 DIAGNOSIS — M6281 Muscle weakness (generalized): Secondary | ICD-10-CM

## 2020-02-15 NOTE — Therapy (Signed)
[facility] [place], [place], [address] Phone: [phone]   Fax:  [phone]  Physical Therapy Treatment  Patient Details  Name: Mary Moss MRN: [id number] Date of Birth: 12-Apr-1936 Referring Provider (PT): Tania Ade, MD   Encounter Date: 02/15/2020   PT End of Session - 02/15/20 0820    Visit Number 21    Number of Visits 24    Date for PT Re-Evaluation 03/07/20    Authorization Type FOTO.   20th vist  41% limitation    PT Start Time [phone]    PT Stop Time [phone]    PT Time Calculation (min) 42 min    Activity Tolerance Patient tolerated treatment well    Behavior During Therapy [facility] for tasks assessed/performed           Past Medical History:  Diagnosis Date  . Ankle fracture fx 13 yrs ago, anklw swells off and on, has cramps in ankle also   left, to seee dr hewitt about 02-16-14  . Arthritis   . Basal cell carcinoma 2015   generalized  . GERD (gastroesophageal reflux disease)    OTC med   pt. denies  . Hypertension   . Pneumonia     Past Surgical History:  Procedure Laterality Date  . ABDOMINAL HYSTERECTOMY    . EYE SURGERY Bilateral    bilaterla cataract extraction with IOL  . FRACTURE SURGERY Left 2001  . JOINT REPLACEMENT    . KNEE ARTHROSCOPY Right 02/07/2014   Procedure: RIGHT ARTHROSCOPY KNEE, WITH MEDIAL MENISCAL DEBRIDEMENT AND CHONDRAPLASTY;  Surgeon: Gearlean Alf, MD;  [facility];  Service: Orthopedics;  Laterality: Right;  . ROTATOR CUFF REPAIR Right 2009  . TOTAL ANKLE ARTHROPLASTY Left 12/20/2014   Procedure: LEFT TOTAL ANKLE ARTHOPLASTY WITH PERCUTANEOUS ACHILLES TENDON LENGTHENING;  Surgeon: Wylene Simmer, MD;  Location: [place];  Service: Orthopedics;  Laterality: Left;  . TOTAL KNEE ARTHROPLASTY Right 06/08/2016   Procedure: RIGHT TOTAL KNEE ARTHROPLASTY;  Surgeon: Gaynelle Arabian, MD;  [facility];  Service: Orthopedics;  Laterality: Right;  . TOTAL SHOULDER  ARTHROPLASTY Right 11/23/2019   Procedure: REVERSE TOTAL SHOULDER ARTHROPLASTY;  Surgeon: Tania Ade, MD;  [facility];  Service: Orthopedics;  Laterality: Right;    There were no vitals filed for this visit.   Subjective Assessment - 02/15/20 0818    Subjective COVID-19 screen performed prior to patient entering clinic.  Patient reports she is good at waist heigh but above chest high she has difficulty.    Pertinent History right massive rotator cuff tear (irreparable), knee replacement, ankle replacement, HTN,  RT Rev. TSR    Limitations Lifting;House hold activities    Diagnostic tests MRI: massive tear of right rotator cuff    Patient Stated Goals Use right UE without much pain.    Currently in Pain? No/denies              [facility] PT Assessment - 02/15/20 0001      Assessment   Medical Diagnosis R reverse shoulder    Referring Provider (PT) Tania Ade, MD    Onset Date/Surgical Date 11/23/19    Hand Dominance Right    Next MD Visit 02/19/2020                         [facility] Adult PT Treatment/Exercise - 02/15/20 0001      Shoulder Exercises: Supine   Flexion AAROM;Right;10 reps    Other  Supine Exercises R AAROM upper cut x10 rpes      Shoulder Exercises: Standing   Protraction Strengthening;Right;20 reps;Theraband    Theraband Level (Shoulder Protraction) Level 2 (Red)    Retraction Strengthening;Right;20 reps;Theraband    Theraband Level (Shoulder Retraction) Level 2 (Red)      Shoulder Exercises: Pulleys   Flexion 5 minutes    Other Pulley Exercises Wall ladder x5 reps      Shoulder Exercises: Isometric Strengthening   External Rotation Supine;5X5"    Internal Rotation Supine;5X5"    ABduction Supine;5X5"    ADduction Supine;5X5"      Modalities   Modalities Vasopneumatic      Vasopneumatic   Number Minutes Vasopneumatic  10 minutes    Vasopnuematic Location  Shoulder    Vasopneumatic Pressure Low    Vasopneumatic Temperature   34      Manual Therapy   Manual Therapy Passive ROM    Passive ROM PROM of R shoulder into flex, ER with holds at end range                       PT Long Term Goals - 02/12/20 0746      PT LONG TERM GOAL #1   Title Patient will be independent with HEP.    Time 6    Period Weeks    Status Achieved      PT LONG TERM GOAL #2   Title Patient will report ability to perform ADLs with right shoulder pain less than or equal to 4/10.    Time 6    Period Weeks    Status On-going   able to perform most ADL's yet limited with overhead movement 02/12/20     PT LONG TERM GOAL #3   Title Patient will demonstrate 70= degrees of right shoulder ER AROM to improve ability to don/doff apparel.    Time 6    Period Weeks    Status On-going      PT LONG TERM GOAL #4   Title Patient will demonstrate 140+ degrees of right shoulder flexion AROM to improve ability to perform shoulder height activities.    Baseline AROM 100 degrees 02/12/20    Time 6    Period Weeks    Status On-going      PT LONG TERM GOAL #5   Title Perform ADL's with pain not > 3/10.    Time 6    Period Weeks    Status On-going                 Plan - 02/15/20 0905    Clinical Impression Statement Patient presented in clinic with continued weakness for any RUE reaching above waist height. Patient instructed to complete more AAROM exercise for upper cut and flexion in supine to eliminate antigravity. Firm end feels and smooth arc of motion noted during PROM of R shoulder. Normal vasopneumatic response noted following removal of the modality.    Personal Factors and Comorbidities Age;Comorbidity 2    Comorbidities right massive rotator cuff tear (irreparable), knee replacement, ankle replacement, HTN    Examination-Activity Limitations Dressing;Hygiene/Grooming;Reach Overhead;Other    Examination-Participation Restrictions Cleaning;Other    Stability/Clinical Decision Making Stable/Uncomplicated    Rehab  Potential Excellent    PT Frequency 2x / week    PT Duration 6 weeks    PT Treatment/Interventions ADLs/Self Care Home Management;Cryotherapy;Electrical Stimulation;Iontophoresis 4mg /ml Dexamethasone;Moist Heat;Therapeutic exercise;Neuromuscular re-education;Manual techniques;Passive range of motion;Therapeutic activities;Patient/family education;Vasopneumatic Device    PT  Next Visit Plan cont per POC per protocol and MD note for 02/19/20    PT Home Exercise Plan see patient education section    Consulted and Agree with Plan of Care Patient           Patient will benefit from skilled therapeutic intervention in order to improve the following deficits and impairments:  Decreased activity tolerance, Decreased strength, Decreased range of motion, Pain, Impaired UE functional use, Increased edema  Visit Diagnosis: Chronic right shoulder pain  Stiffness of right shoulder, not elsewhere classified  Muscle weakness (generalized)     Problem List Patient Active Problem List   Diagnosis Date Noted  . Status post reverse arthroplasty of right shoulder 11/23/2019  . Morbid obesity ([place]) 11/18/2018  . OA (osteoarthritis) of knee 06/08/2016  . Arthritis of knee, right 01/31/2016  . Hyperlipidemia 09/02/2015  . Pre-diabetes 07/12/2015  . Essential hypertension 07/03/2015  . Post-traumatic arthritis of ankle 12/20/2014  . Acute medial meniscal tear 02/06/2014  . Low bone mass 12/14/2012  . GAD (generalized anxiety disorder) 12/14/2012  . Vitamin D deficiency 12/14/2012    Standley Brooking, PTA 02/15/2020, 9:18 AM  [facility] [address] [place], [place], [address] Phone: [phone]   Fax:  [phone]  Name: RUTHELL FEIGENBAUM MRN: [id number] Date of Birth: 12/09/1935

## 2020-02-19 ENCOUNTER — Other Ambulatory Visit: Payer: Self-pay

## 2020-02-19 ENCOUNTER — Ambulatory Visit: Payer: Medicare Other | Admitting: Physical Therapy

## 2020-02-19 ENCOUNTER — Encounter: Payer: Self-pay | Admitting: Physical Therapy

## 2020-02-19 DIAGNOSIS — G8929 Other chronic pain: Secondary | ICD-10-CM

## 2020-02-19 DIAGNOSIS — M6281 Muscle weakness (generalized): Secondary | ICD-10-CM

## 2020-02-19 DIAGNOSIS — M25511 Pain in right shoulder: Secondary | ICD-10-CM | POA: Diagnosis not present

## 2020-02-19 DIAGNOSIS — M25611 Stiffness of right shoulder, not elsewhere classified: Secondary | ICD-10-CM

## 2020-02-19 NOTE — Therapy (Signed)
[facility] [place], [place], [address] Phone: [phone]   Fax:  [phone]  Physical Therapy Treatment  Patient Details  Name: Mary Moss MRN: [id number] Date of Birth: 05/21/1936 Referring Provider (PT): Tania Ade, MD   Encounter Date: 02/19/2020   PT End of Session - 02/19/20 0735    Visit Number 22    Number of Visits 24    Date for PT Re-Evaluation 03/07/20    Authorization Type FOTO.   20th vist  41% limitation    PT Start Time [phone]    PT Stop Time 0814    PT Time Calculation (min) 40 min    Activity Tolerance Patient tolerated treatment well    Behavior During Therapy [facility] for tasks assessed/performed           Past Medical History:  Diagnosis Date  . Ankle fracture fx 13 yrs ago, anklw swells off and on, has cramps in ankle also   left, to seee dr hewitt about 02-16-14  . Arthritis   . Basal cell carcinoma 2015   generalized  . GERD (gastroesophageal reflux disease)    OTC med   pt. denies  . Hypertension   . Pneumonia     Past Surgical History:  Procedure Laterality Date  . ABDOMINAL HYSTERECTOMY    . EYE SURGERY Bilateral    bilaterla cataract extraction with IOL  . FRACTURE SURGERY Left 2001  . JOINT REPLACEMENT    . KNEE ARTHROSCOPY Right 02/07/2014   Procedure: RIGHT ARTHROSCOPY KNEE, WITH MEDIAL MENISCAL DEBRIDEMENT AND CHONDRAPLASTY;  Surgeon: Gearlean Alf, MD;  [facility];  Service: Orthopedics;  Laterality: Right;  . ROTATOR CUFF REPAIR Right 2009  . TOTAL ANKLE ARTHROPLASTY Left 12/20/2014   Procedure: LEFT TOTAL ANKLE ARTHOPLASTY WITH PERCUTANEOUS ACHILLES TENDON LENGTHENING;  Surgeon: Wylene Simmer, MD;  Location: [place];  Service: Orthopedics;  Laterality: Left;  . TOTAL KNEE ARTHROPLASTY Right 06/08/2016   Procedure: RIGHT TOTAL KNEE ARTHROPLASTY;  Surgeon: Gaynelle Arabian, MD;  [facility];  Service: Orthopedics;  Laterality: Right;  . TOTAL SHOULDER  ARTHROPLASTY Right 11/23/2019   Procedure: REVERSE TOTAL SHOULDER ARTHROPLASTY;  Surgeon: Tania Ade, MD;  [facility];  Service: Orthopedics;  Laterality: Right;    There were no vitals filed for this visit.   Subjective Assessment - 02/19/20 0734    Subjective COVID-19 screen performed prior to patient entering clinic. Reports she is okay with ADLs and has no pain just weak.    Pertinent History right massive rotator cuff tear (irreparable), knee replacement, ankle replacement, HTN,  RT Rev. TSR    Limitations Lifting;House hold activities    Diagnostic tests MRI: massive tear of right rotator cuff    Patient Stated Goals Use right UE without much pain.    Currently in Pain? No/denies              [facility] PT Assessment - 02/19/20 0001      Assessment   Medical Diagnosis R reverse shoulder    Referring Provider (PT) Tania Ade, MD    Onset Date/Surgical Date 11/23/19    Hand Dominance Right    Next MD Visit 02/19/2020      ROM / Strength   AROM / PROM / Strength AROM      AROM   Overall AROM  Deficits    Overall AROM Comments supine    AROM Assessment Site Shoulder    Right/Left Shoulder Right    Right  Shoulder Flexion 80 Degrees    Right Shoulder Internal Rotation 62 Degrees    Right Shoulder External Rotation 37 Degrees                         [facility] Adult PT Treatment/Exercise - 02/19/20 0001      Shoulder Exercises: Supine   Protraction AROM;Right;15 reps    Other Supine Exercises R AROM upper cuts x15 reps      Shoulder Exercises: Standing   Protraction Strengthening;Right;20 reps;Theraband    Theraband Level (Shoulder Protraction) Level 2 (Red)    External Rotation Strengthening;Right;20 reps;Theraband    Theraband Level (Shoulder External Rotation) Level 2 (Red)    Internal Rotation Strengthening;Right;20 reps;Theraband    Theraband Level (Shoulder Internal Rotation) Level 2 (Red)      Shoulder Exercises: Pulleys   Flexion 5  minutes    Other Pulley Exercises Wall ladder x5 reps      Modalities   Modalities Vasopneumatic      Vasopneumatic   Number Minutes Vasopneumatic  10 minutes    Vasopnuematic Location  Shoulder    Vasopneumatic Pressure Low    Vasopneumatic Temperature  34      Manual Therapy   Manual Therapy Passive ROM    Passive ROM PROM of R shoulder into flex, ER with holds at end range                       PT Long Term Goals - 02/19/20 0803      PT LONG TERM GOAL #1   Title Patient will be independent with HEP.    Time 6    Period Weeks    Status Achieved      PT LONG TERM GOAL #2   Title Patient will report ability to perform ADLs with right shoulder pain less than or equal to 4/10.    Time 6    Period Weeks    Status Achieved   able to perform most ADL's yet limited with overhead movement 02/12/20     PT LONG TERM GOAL #3   Title Patient will demonstrate 70= degrees of right shoulder ER AROM to improve ability to don/doff apparel.    Time 6    Period Weeks    Status Not Met      PT LONG TERM GOAL #4   Title Patient will demonstrate 140+ degrees of right shoulder flexion AROM to improve ability to perform shoulder height activities.    Baseline AROM 100 degrees 02/12/20    Time 6    Period Weeks    Status Not Met      PT LONG TERM GOAL #5   Title Perform ADL's with pain not > 3/10.    Time 6    Period Weeks    Status Achieved                 Plan - 02/19/20 0804    Clinical Impression Statement Patient presented in clinic with only limitations of reaching up overhead due to weakness. Patient not limited by pain during ADLs. Patient limited by some resistance exercises especially ER and as she fatigues. Patient able to tolerate some supine AROM exercises but still limited with muscle fatigue. Firm end feels and smooth arc of motion noted during PROM of R shoulder. Limited AROM due to weakness and fatigue. Normal vasopneumatic response noted following  removal of the modality.    Personal Factors and  Comorbidities Age;Comorbidity 2    Comorbidities right massive rotator cuff tear (irreparable), knee replacement, ankle replacement, HTN    Examination-Activity Limitations Dressing;Hygiene/Grooming;Reach Overhead;Other    Examination-Participation Restrictions Cleaning;Other    Stability/Clinical Decision Making Stable/Uncomplicated    Rehab Potential Excellent    PT Frequency 2x / week    PT Duration 6 weeks    PT Treatment/Interventions ADLs/Self Care Home Management;Cryotherapy;Electrical Stimulation;Iontophoresis 60m/ml Dexamethasone;Moist Heat;Therapeutic exercise;Neuromuscular re-education;Manual techniques;Passive range of motion;Therapeutic activities;Patient/family education;Vasopneumatic Device    PT Next Visit Plan cont per POC per protocol and MD note for 02/19/20    PT Home Exercise Plan see patient education section    Consulted and Agree with Plan of Care Patient           Patient will benefit from skilled therapeutic intervention in order to improve the following deficits and impairments:  Decreased activity tolerance, Decreased strength, Decreased range of motion, Pain, Impaired UE functional use, Increased edema  Visit Diagnosis: Chronic right shoulder pain  Stiffness of right shoulder, not elsewhere classified  Muscle weakness (generalized)     Problem List Patient Active Problem List   Diagnosis Date Noted  . Status post reverse arthroplasty of right shoulder 11/23/2019  . [place] 11/18/2018  . OA (osteoarthritis) of knee 06/08/2016  . Arthritis of knee, right 01/31/2016  . Hyperlipidemia 09/02/2015  . Pre-diabetes 07/12/2015  . Essential hypertension 07/03/2015  . Post-traumatic arthritis of ankle 12/20/2014  . Acute medial meniscal tear 02/06/2014  . Low bone mass 12/14/2012  . GAD (generalized anxiety disorder) 12/14/2012  . Vitamin D deficiency 12/14/2012   KStandley Brooking  PTA 02/19/20 8:20 AM   [facility] [address] [address]Phone: [phone]  Fax:  [phone] Name: Mary CHEEVERMRN: [id number] of Birth: [birth year]

## 2020-02-21 ENCOUNTER — Other Ambulatory Visit: Payer: Self-pay | Admitting: Family

## 2020-02-21 DIAGNOSIS — I10 Essential (primary) hypertension: Secondary | ICD-10-CM

## 2020-02-22 ENCOUNTER — Ambulatory Visit: Payer: Medicare Other | Admitting: Physical Therapy

## 2020-02-22 ENCOUNTER — Other Ambulatory Visit: Payer: Self-pay

## 2020-02-22 DIAGNOSIS — M25611 Stiffness of right shoulder, not elsewhere classified: Secondary | ICD-10-CM

## 2020-02-22 DIAGNOSIS — G8929 Other chronic pain: Secondary | ICD-10-CM

## 2020-02-22 DIAGNOSIS — M6281 Muscle weakness (generalized): Secondary | ICD-10-CM

## 2020-02-22 DIAGNOSIS — M25511 Pain in right shoulder: Secondary | ICD-10-CM | POA: Diagnosis not present

## 2020-02-22 NOTE — Therapy (Signed)
[facility] [place], [place], [address] Phone: [phone]   Fax:  [phone]  Physical Therapy Treatment  Patient Details  Name: Mary Moss MRN: [id number] Date of Birth: January 07, 1936 Referring Provider (PT): Tania Ade, MD   Encounter Date: 02/22/2020   PT End of Session - 02/22/20 0740    Visit Number 23    Number of Visits 26   re cert from 6/71/24   Date for PT Re-Evaluation 03/07/20    Authorization Type FOTO.   20th vist  41% limitation    PT Start Time [phone]    PT Stop Time 0810    PT Time Calculation (min) 39 min    Activity Tolerance Patient tolerated treatment well    Behavior During Therapy [facility] for tasks assessed/performed           Past Medical History:  Diagnosis Date  . Ankle fracture fx 13 yrs ago, anklw swells off and on, has cramps in ankle also   left, to seee dr hewitt about 02-16-14  . Arthritis   . Basal cell carcinoma 2015   generalized  . GERD (gastroesophageal reflux disease)    OTC med   pt. denies  . Hypertension   . Pneumonia     Past Surgical History:  Procedure Laterality Date  . ABDOMINAL HYSTERECTOMY    . EYE SURGERY Bilateral    bilaterla cataract extraction with IOL  . FRACTURE SURGERY Left 2001  . JOINT REPLACEMENT    . KNEE ARTHROSCOPY Right 02/07/2014   Procedure: RIGHT ARTHROSCOPY KNEE, WITH MEDIAL MENISCAL DEBRIDEMENT AND CHONDRAPLASTY;  Surgeon: Gearlean Alf, MD;  Location: [facility];  Service: Orthopedics;  Laterality: Right;  . ROTATOR CUFF REPAIR Right 2009  . TOTAL ANKLE ARTHROPLASTY Left 12/20/2014   Procedure: LEFT TOTAL ANKLE ARTHOPLASTY WITH PERCUTANEOUS ACHILLES TENDON LENGTHENING;  Surgeon: Wylene Simmer, MD;  Location: [facility];  Service: Orthopedics;  Laterality: Left;  . TOTAL KNEE ARTHROPLASTY Right 06/08/2016   Procedure: RIGHT TOTAL KNEE ARTHROPLASTY;  Surgeon: Gaynelle Arabian, MD;  Location: [facility];  Service: Orthopedics;  Laterality: Right;  .  TOTAL SHOULDER ARTHROPLASTY Right 11/23/2019   Procedure: REVERSE TOTAL SHOULDER ARTHROPLASTY;  Surgeon: Tania Ade, MD;  Location: [facility];  Service: Orthopedics;  Laterality: Right;    There were no vitals filed for this visit.   Subjective Assessment - 02/22/20 0736    Subjective COVID-19 screen performed prior to patient entering clinic. Patient arrived with ongoing lack of movement in shoulder, she went to MD and he was not happy with her current lack of movement and to cont therapy    Pertinent History right massive rotator cuff tear (irreparable), knee replacement, ankle replacement, HTN,  RT Rev. TSR    Limitations Lifting;House hold activities    Diagnostic tests MRI: massive tear of right rotator cuff    Patient Stated Goals Use right UE without much pain.    Currently in Pain? No/denies                             [facility] Adult PT Treatment/Exercise - 02/22/20 0001      Shoulder Exercises: Seated   External Rotation AROM;Strengthening;Right;Limitations    External Rotation Limitations assisted       Shoulder Exercises: Standing   Protraction Strengthening;Right;20 reps;Theraband    Theraband Level (Shoulder Protraction) Level 2 (Red)    External Rotation --    Theraband Level (Shoulder External  Rotation) --    Internal Rotation Strengthening;Right;20 reps;Theraband    Theraband Level (Shoulder Internal Rotation) Level 2 (Red)      Shoulder Exercises: Pulleys   Flexion 5 minutes    Other Pulley Exercises wall ladder with eccentric lowering x fatigue      Vasopneumatic   Number Minutes Vasopneumatic  10 minutes    Vasopnuematic Location  Shoulder    Vasopneumatic Pressure Low    Vasopneumatic Temperature  34      Manual Therapy   Manual Therapy Passive ROM    Manual therapy comments manual assist for flexion and eccentric lowering to improve strength and mobility                       PT Long Term Goals - 02/19/20 0803      PT  LONG TERM GOAL #1   Title Patient will be independent with HEP.    Time 6    Period Weeks    Status Achieved      PT LONG TERM GOAL #2   Title Patient will report ability to perform ADLs with right shoulder pain less than or equal to 4/10.    Time 6    Period Weeks    Status Achieved   able to perform most ADL's yet limited with overhead movement 02/12/20     PT LONG TERM GOAL #3   Title Patient will demonstrate 70= degrees of right shoulder ER AROM to improve ability to don/doff apparel.    Time 6    Period Weeks    Status Not Met      PT LONG TERM GOAL #4   Title Patient will demonstrate 140+ degrees of right shoulder flexion AROM to improve ability to perform shoulder height activities.    Baseline AROM 100 degrees 02/12/20    Time 6    Period Weeks    Status Not Met      PT LONG TERM GOAL #5   Title Perform ADL's with pain not > 3/10.    Time 6    Period Weeks    Status Achieved                 Plan - 02/22/20 0803    Clinical Impression Statement Patient tolerated treatment well today. Today focused on eccentric for flexion today to improve function and strength. Patient was educated on wall slide with eccentric loweing for daily HEP. Patient has no pain overall only soreness. Patient has reported overall greater ease with ADL's and all movement for right shoulder, limitations with overhead movements. Patient goals ongoing this week.    Personal Factors and Comorbidities Age;Comorbidity 2    Comorbidities right massive rotator cuff tear (irreparable), knee replacement, ankle replacement, HTN    Examination-Activity Limitations Dressing;Hygiene/Grooming;Reach Overhead;Other    Examination-Participation Restrictions Cleaning;Other    Stability/Clinical Decision Making Stable/Uncomplicated    Rehab Potential Excellent    PT Frequency 2x / week    PT Duration 6 weeks    PT Treatment/Interventions ADLs/Self Care Home Management;Cryotherapy;Electrical  Stimulation;Iontophoresis 4mg/ml Dexamethasone;Moist Heat;Therapeutic exercise;Neuromuscular re-education;Manual techniques;Passive range of motion;Therapeutic activities;Patient/family education;Vasopneumatic Device    PT Next Visit Plan cont per POC per protocol and eccentric lowering for flexion    Consulted and Agree with Plan of Care Patient           Patient will benefit from skilled therapeutic intervention in order to improve the following deficits and impairments:  Decreased activity tolerance, Decreased strength,   Decreased range of motion, Pain, Impaired UE functional use, Increased edema  Visit Diagnosis: Chronic right shoulder pain  Stiffness of right shoulder, not elsewhere classified  Muscle weakness (generalized)     Problem List Patient Active Problem List   Diagnosis Date Noted  . Status post reverse arthroplasty of right shoulder 11/23/2019  . Morbid obesity ([place]) 11/18/2018  . OA (osteoarthritis) of knee 06/08/2016  . Arthritis of knee, right 01/31/2016  . Hyperlipidemia 09/02/2015  . Pre-diabetes 07/12/2015  . Essential hypertension 07/03/2015  . Post-traumatic arthritis of ankle 12/20/2014  . Acute medial meniscal tear 02/06/2014  . Low bone mass 12/14/2012  . GAD (generalized anxiety disorder) 12/14/2012  . Vitamin D deficiency 12/14/2012    Phillips Climes, PTA 02/22/2020, 8:13 AM  [facility] [place], [place], [address] Phone: [phone]   Fax:  [phone]  Name: ROSANA FARNELL MRN: [id number] Date of Birth: 03-08-1936

## 2020-02-27 ENCOUNTER — Ambulatory Visit: Payer: Medicare Other | Attending: Orthopedic Surgery | Admitting: Physical Therapy

## 2020-02-27 ENCOUNTER — Encounter: Payer: Self-pay | Admitting: Physical Therapy

## 2020-02-27 ENCOUNTER — Other Ambulatory Visit: Payer: Self-pay

## 2020-02-27 DIAGNOSIS — M25511 Pain in right shoulder: Secondary | ICD-10-CM | POA: Diagnosis not present

## 2020-02-27 DIAGNOSIS — M6281 Muscle weakness (generalized): Secondary | ICD-10-CM | POA: Insufficient documentation

## 2020-02-27 DIAGNOSIS — G8929 Other chronic pain: Secondary | ICD-10-CM | POA: Insufficient documentation

## 2020-02-27 DIAGNOSIS — M25611 Stiffness of right shoulder, not elsewhere classified: Secondary | ICD-10-CM | POA: Insufficient documentation

## 2020-02-27 NOTE — Therapy (Signed)
[facility] [place], [place], [address] Phone: [phone]   Fax:  [phone]  Physical Therapy Treatment  Patient Details  Name: Mary Moss MRN: [id number] Date of Birth: 11/18/1935 Referring Provider (PT): Tania Ade, MD   Encounter Date: 02/27/2020   PT End of Session - 02/27/20 0818    Visit Number 24    Number of Visits 26    Date for PT Re-Evaluation 03/07/20    Authorization Type FOTO.   20th vist  41% limitation    PT Start Time [phone]    PT Stop Time [phone]    PT Time Calculation (min) 38 min    Activity Tolerance Patient limited by pain    Behavior During Therapy [facility] for tasks assessed/performed           Past Medical History:  Diagnosis Date  . Ankle fracture fx 13 yrs ago, anklw swells off and on, has cramps in ankle also   left, to seee dr hewitt about 02-16-14  . Arthritis   . Basal cell carcinoma 2015   generalized  . GERD (gastroesophageal reflux disease)    OTC med   pt. denies  . Hypertension   . Pneumonia     Past Surgical History:  Procedure Laterality Date  . ABDOMINAL HYSTERECTOMY    . EYE SURGERY Bilateral    bilaterla cataract extraction with IOL  . FRACTURE SURGERY Left 2001  . JOINT REPLACEMENT    . KNEE ARTHROSCOPY Right 02/07/2014   Procedure: RIGHT ARTHROSCOPY KNEE, WITH MEDIAL MENISCAL DEBRIDEMENT AND CHONDRAPLASTY;  Surgeon: Gearlean Alf, MD;  Location: [facility];  Service: Orthopedics;  Laterality: Right;  . ROTATOR CUFF REPAIR Right 2009  . TOTAL ANKLE ARTHROPLASTY Left 12/20/2014   Procedure: LEFT TOTAL ANKLE ARTHOPLASTY WITH PERCUTANEOUS ACHILLES TENDON LENGTHENING;  Surgeon: Wylene Simmer, MD;  Location: [place] [facility];  Service: Orthopedics;  Laterality: Left;  . TOTAL KNEE ARTHROPLASTY Right 06/08/2016   Procedure: RIGHT TOTAL KNEE ARTHROPLASTY;  Surgeon: Gaynelle Arabian, MD;  Location: [facility];  Service: Orthopedics;  Laterality: Right;  . TOTAL SHOULDER ARTHROPLASTY Right  11/23/2019   Procedure: REVERSE TOTAL SHOULDER ARTHROPLASTY;  Surgeon: Tania Ade, MD;  Location: [facility];  Service: Orthopedics;  Laterality: Right;    There were no vitals filed for this visit.   Subjective Assessment - 02/27/20 0817    Subjective COVID 19 screening performed on patient upon arrival. Patient reporting more LBP recently but not seeing much improvement regarding R shoulder mobility.    Pertinent History right massive rotator cuff tear (irreparable), knee replacement, ankle replacement, HTN,  RT Rev. TSR    Limitations Lifting;House hold activities    Diagnostic tests MRI: massive tear of right rotator cuff    Patient Stated Goals Use right UE without much pain.    Currently in Pain? No/denies              [facility] PT Assessment - 02/27/20 0001      Assessment   Medical Diagnosis R reverse shoulder    Referring Provider (PT) Tania Ade, MD    Onset Date/Surgical Date 11/23/19    Hand Dominance Right    Next MD Visit 04/02/2020                         [facility] Adult PT Treatment/Exercise - 02/27/20 0001      Shoulder Exercises: Supine   Protraction AROM;Right;15 reps    Flexion AROM;Right;15 reps  Shoulder Exercises: Pulleys   Flexion 5 minutes      Shoulder Exercises: ROM/Strengthening   Wall Wash into flexion x5 reps with min assist    limited by LBP     Shoulder Exercises: Isometric Strengthening   External Rotation Supine;5X5"    Internal Rotation Supine;5X5"    ABduction Supine;5X5"      Modalities   Modalities Vasopneumatic      Vasopneumatic   Number Minutes Vasopneumatic  10 minutes    Vasopnuematic Location  Shoulder    Vasopneumatic Pressure Low    Vasopneumatic Temperature  34/discomfort      Manual Therapy   Passive ROM PROM of R shoulder into flex, ER with holds at end range                       PT Long Term Goals - 02/19/20 0803      PT LONG TERM GOAL #1   Title Patient will be independent  with HEP.    Time 6    Period Weeks    Status Achieved      PT LONG TERM GOAL #2   Title Patient will report ability to perform ADLs with right shoulder pain less than or equal to 4/10.    Time 6    Period Weeks    Status Achieved   able to perform most ADL's yet limited with overhead movement 02/12/20     PT LONG TERM GOAL #3   Title Patient will demonstrate 70= degrees of right shoulder ER AROM to improve ability to don/doff apparel.    Time 6    Period Weeks    Status Not Met      PT LONG TERM GOAL #4   Title Patient will demonstrate 140+ degrees of right shoulder flexion AROM to improve ability to perform shoulder height activities.    Baseline AROM 100 degrees 02/12/20    Time 6    Period Weeks    Status Not Met      PT LONG TERM GOAL #5   Title Perform ADL's with pain not > 3/10.    Time 6    Period Weeks    Status Achieved                 Plan - 02/27/20 0848    Clinical Impression Statement Patient presented in clinic with greater LBP today which limited therex treatment. Patient still experiencing weakness and debility with shoulder flexion and elevation without assist. Patient compliant with HEP although limited in recent weeks due to LBP. Patient able to complete ADLs with some limitation but able to mop her floors and clean as needed. Firm end feels and smooth arc of motion noted during PROM of R shoulder. Normal vasopneumatic response noted following removal of the modality.    Personal Factors and Comorbidities Age;Comorbidity 2    Comorbidities right massive rotator cuff tear (irreparable), knee replacement, ankle replacement, HTN    Examination-Activity Limitations Dressing;Hygiene/Grooming;Reach Overhead;Other    Examination-Participation Restrictions Cleaning;Other    Stability/Clinical Decision Making Stable/Uncomplicated    Rehab Potential Excellent    PT Frequency 2x / week    PT Duration 6 weeks    PT Treatment/Interventions ADLs/Self Care Home  Management;Cryotherapy;Electrical Stimulation;Iontophoresis 41m/ml Dexamethasone;Moist Heat;Therapeutic exercise;Neuromuscular re-education;Manual techniques;Passive range of motion;Therapeutic activities;Patient/family education;Vasopneumatic Device    PT Next Visit Plan cont per POC per protocol and eccentric lowering for flexion    PT Home Exercise Plan see patient education section  Consulted and Agree with Plan of Care Patient           Patient will benefit from skilled therapeutic intervention in order to improve the following deficits and impairments:  Decreased activity tolerance, Decreased strength, Decreased range of motion, Pain, Impaired UE functional use, Increased edema  Visit Diagnosis: Chronic right shoulder pain  Stiffness of right shoulder, not elsewhere classified  Muscle weakness (generalized)     Problem List Patient Active Problem List   Diagnosis Date Noted  . Status post reverse arthroplasty of right shoulder 11/23/2019  . Morbid obesity ([place]) 11/18/2018  . OA (osteoarthritis) of knee 06/08/2016  . Arthritis of knee, right 01/31/2016  . Hyperlipidemia 09/02/2015  . Pre-diabetes 07/12/2015  . Essential hypertension 07/03/2015  . Post-traumatic arthritis of ankle 12/20/2014  . Acute medial meniscal tear 02/06/2014  . Low bone mass 12/14/2012  . GAD (generalized anxiety disorder) 12/14/2012  . Vitamin D deficiency 12/14/2012    Standley Brooking, PTA 02/27/2020, 9:05 AM  [facility] [place], [place], [address] Phone: [phone]   Fax:  [phone]  Name: Mary Moss MRN: [id number] Date of Birth: 25-Oct-1935

## 2020-03-01 ENCOUNTER — Other Ambulatory Visit: Payer: Self-pay

## 2020-03-01 ENCOUNTER — Ambulatory Visit: Payer: Medicare Other | Admitting: Physical Therapy

## 2020-03-05 ENCOUNTER — Encounter: Payer: Medicare Other | Admitting: Physical Therapy

## 2020-03-08 ENCOUNTER — Encounter: Payer: Medicare Other | Admitting: Physical Therapy

## 2020-03-16 ENCOUNTER — Other Ambulatory Visit: Payer: Self-pay | Admitting: Family

## 2020-03-16 DIAGNOSIS — I10 Essential (primary) hypertension: Secondary | ICD-10-CM

## 2020-03-18 NOTE — Telephone Encounter (Signed)
[place]. NTBS 30 days given 02/21/20

## 2020-04-26 ENCOUNTER — Other Ambulatory Visit: Payer: Self-pay | Admitting: Family

## 2020-04-26 DIAGNOSIS — I10 Essential (primary) hypertension: Secondary | ICD-10-CM

## 2020-05-05 ENCOUNTER — Other Ambulatory Visit: Payer: Self-pay | Admitting: Family

## 2020-05-05 DIAGNOSIS — I10 Essential (primary) hypertension: Secondary | ICD-10-CM

## 2020-05-15 ENCOUNTER — Other Ambulatory Visit: Payer: Self-pay | Admitting: Family

## 2020-05-15 ENCOUNTER — Telehealth: Payer: Self-pay

## 2020-05-15 DIAGNOSIS — I10 Essential (primary) hypertension: Secondary | ICD-10-CM

## 2020-05-15 NOTE — Telephone Encounter (Signed)
FYI: Pt called to get refills on her HCTZ Rx. Explained to pt that when [facility] last sent in 30 day refill back in July 2021, pt should have scheduled appt at that time because it is noted that pt needed appt for more refills. Told pt that first available for Mary Moss would be Nov 16th and before I could finish explaining, pt started to laugh while getting upset and said that she would call one of her other doctors to get refills.   Was going to let pt know that I could put a message in asking if Mary Moss could prescribe her enough of the medicine to last her until she came in but pt was upset and ended up hanging up.

## 2020-05-15 NOTE — Telephone Encounter (Signed)
Called patient back - appointment scheduled and states she did not need a refill at this time

## 2020-06-08 ENCOUNTER — Other Ambulatory Visit: Payer: Self-pay | Admitting: Family

## 2020-06-08 DIAGNOSIS — I10 Essential (primary) hypertension: Secondary | ICD-10-CM

## 2020-06-11 ENCOUNTER — Ambulatory Visit: Payer: Medicare Other | Admitting: Family

## 2020-07-02 ENCOUNTER — Other Ambulatory Visit: Payer: Self-pay

## 2020-07-02 ENCOUNTER — Encounter: Payer: Self-pay | Admitting: Family

## 2020-07-02 ENCOUNTER — Ambulatory Visit (INDEPENDENT_AMBULATORY_CARE_PROVIDER_SITE_OTHER): Payer: Medicare Other | Admitting: Family

## 2020-07-02 VITALS — BP 126/70 | HR 87 | Temp 97.5°F | Ht 68.0 in | Wt 206.8 lb

## 2020-07-02 DIAGNOSIS — E785 Hyperlipidemia, unspecified: Secondary | ICD-10-CM | POA: Diagnosis not present

## 2020-07-02 DIAGNOSIS — J069 Acute upper respiratory infection, unspecified: Secondary | ICD-10-CM

## 2020-07-02 DIAGNOSIS — F411 Generalized anxiety disorder: Secondary | ICD-10-CM

## 2020-07-02 DIAGNOSIS — M179 Osteoarthritis of knee, unspecified: Secondary | ICD-10-CM

## 2020-07-02 DIAGNOSIS — I10 Essential (primary) hypertension: Secondary | ICD-10-CM | POA: Diagnosis not present

## 2020-07-02 DIAGNOSIS — R7303 Prediabetes: Secondary | ICD-10-CM

## 2020-07-02 DIAGNOSIS — M1710 Unilateral primary osteoarthritis, unspecified knee: Secondary | ICD-10-CM

## 2020-07-02 DIAGNOSIS — E559 Vitamin D deficiency, unspecified: Secondary | ICD-10-CM

## 2020-07-02 LAB — CMP14+EGFR
ALT: 10 IU/L (ref 0–32)
AST: 15 IU/L (ref 0–40)
Albumin/Globulin Ratio: 1.7 (ref 1.2–2.2)
Albumin: 4.2 g/dL (ref 3.6–4.6)
Alkaline Phosphatase: 97 IU/L (ref 44–121)
BUN/Creatinine Ratio: 20 (ref 12–28)
BUN: 17 mg/dL (ref 8–27)
Bilirubin Total: 0.6 mg/dL (ref 0.0–1.2)
CO2: 26 mmol/L (ref 20–29)
Calcium: 9.6 mg/dL (ref 8.7–10.3)
Chloride: 101 mmol/L (ref 96–106)
Creatinine, Ser: 0.84 mg/dL (ref 0.57–1.00)
GFR calc Af Amer: 75 mL/min/{1.73_m2} (ref >59)
GFR calc non Af Amer: 65 mL/min/{1.73_m2} (ref >59)
Globulin, Total: 2.5 g/dL (ref 1.5–4.5)
Glucose: 110 mg/dL — ABNORMAL HIGH (ref 65–99)
Potassium: 4.7 mmol/L (ref 3.5–5.2)
Sodium: 140 mmol/L (ref 134–144)
Total Protein: 6.7 g/dL (ref 6.0–8.5)

## 2020-07-02 LAB — CBC WITH DIFFERENTIAL/PLATELET
Basophils Absolute: 0.1 10*3/uL (ref 0.0–0.2)
Basos: 1 %
EOS (ABSOLUTE): 0.4 10*3/uL (ref 0.0–0.4)
Eos: 4 %
Hematocrit: 39.5 % (ref 34.0–46.6)
Hemoglobin: 13.4 g/dL (ref 11.1–15.9)
Immature Grans (Abs): 0 10*3/uL (ref 0.0–0.1)
Immature Granulocytes: 0 %
Lymphocytes Absolute: 2.4 10*3/uL (ref 0.7–3.1)
Lymphs: 28 %
MCH: 31.5 pg (ref 26.6–33.0)
MCHC: 33.9 g/dL (ref 31.5–35.7)
MCV: 93 fL (ref 79–97)
Monocytes Absolute: 0.7 10*3/uL (ref 0.1–0.9)
Monocytes: 8 %
Neutrophils Absolute: 4.9 10*3/uL (ref 1.4–7.0)
Neutrophils: 59 %
Platelets: 266 10*3/uL (ref 150–450)
RBC: 4.26 x10E6/uL (ref 3.77–5.28)
RDW: 11.8 % (ref 11.7–15.4)
WBC: 8.5 10*3/uL (ref 3.4–10.8)

## 2020-07-02 MED ORDER — HYDROCHLOROTHIAZIDE 25 MG PO TABS: 25.0000 mg | ORAL_TABLET | Freq: Every day | ORAL | 2 refills | Status: DC

## 2020-07-02 MED ORDER — LISINOPRIL 10 MG PO TABS: 10.0000 mg | ORAL_TABLET | Freq: Every day | ORAL | 2 refills | Status: DC

## 2020-07-02 NOTE — Patient Instructions (Signed)
Upper Respiratory Infection, Adult An upper respiratory infection (URI) is a common viral infection of the nose, throat, and upper air passages that lead to the lungs. The most common type of URI is the common cold. URIs usually get better on their own, without medical treatment. What are the causes? A URI is caused by a virus. You may catch a virus by:  Breathing in droplets from an infected person's cough or sneeze.  Touching something that has been exposed to the virus (contaminated) and then touching your mouth, nose, or eyes. What increases the risk? You are more likely to get a URI if:  You are very young or very old.  It is autumn or winter.  You have close contact with others, such as at a daycare, school, or health care facility.  You smoke.  You have long-term (chronic) heart or lung disease.  You have a weakened disease-fighting (immune) system.  You have nasal allergies or asthma.  You are experiencing a lot of stress.  You work in an area that has poor air circulation.  You have poor nutrition. What are the signs or symptoms? A URI usually involves some of the following symptoms:  Runny or stuffy (congested) nose.  Sneezing.  Cough.  Sore throat.  Headache.  Fatigue.  Fever.  Loss of appetite.  Pain in your forehead, behind your eyes, and over your cheekbones (sinus pain).  Muscle aches.  Redness or irritation of the eyes.  Pressure in the ears or face. How is this diagnosed? This condition may be diagnosed based on your medical history and symptoms, and a physical exam. Your health care provider may use a cotton swab to take a mucus sample from your nose (nasal swab). This sample can be tested to determine what virus is causing the illness. How is this treated? URIs usually get better on their own within 7-10 days. You can take steps at home to relieve your symptoms. Medicines cannot cure URIs, but your health care provider may recommend  certain medicines to help relieve symptoms, such as:  Over-the-counter cold medicines.  Cough suppressants. Coughing is a type of defense against infection that helps to clear the respiratory system, so take these medicines only as recommended by your health care provider.  Fever-reducing medicines. Follow these instructions at home: Activity  Rest as needed.  If you have a fever, stay home from work or school until your fever is gone or until your health care provider says you are no longer contagious. Your health care provider may have you wear a face mask to prevent your infection from spreading. Relieving symptoms  Gargle with a salt-water mixture 3-4 times a day or as needed. To make a salt-water mixture, completely dissolve -1 tsp of salt in 1 cup of warm water.  Use a cool-mist humidifier to add moisture to the air. This can help you breathe more easily. Eating and drinking   Drink enough fluid to keep your urine pale yellow.  Eat soups and other clear broths. General instructions   Take over-the-counter and prescription medicines only as told by your health care provider. These include cold medicines, fever reducers, and cough suppressants.  Do not use any products that contain nicotine or tobacco, such as cigarettes and e-cigarettes. If you need help quitting, ask your health care provider.  Stay away from secondhand smoke.  Stay up to date on all immunizations, including the yearly (annual) flu vaccine.  Keep all follow-up visits as told by your health   care provider. This is important. How to prevent the spread of infection to others   URIs can be passed from person to person (are contagious). To prevent the infection from spreading: ? Wash your hands often with soap and water. If soap and water are not available, use hand sanitizer. ? Avoid touching your mouth, face, eyes, or nose. ? Cough or sneeze into a tissue or your sleeve or elbow instead of into your hand  or into the air. Contact a health care provider if:  You are getting worse instead of better.  You have a fever or chills.  Your mucus is brown or red.  You have yellow or brown discharge coming from your nose.  You have pain in your face, especially when you bend forward.  You have swollen neck glands.  You have pain while swallowing.  You have white areas in the back of your throat. Get help right away if:  You have shortness of breath that gets worse.  You have severe or persistent: ? Headache. ? Ear pain. ? Sinus pain. ? Chest pain.  You have chronic lung disease along with any of the following: ? Wheezing. ? Prolonged cough. ? Coughing up blood. ? A change in your usual mucus.  You have a stiff neck.  You have changes in your: ? Vision. ? Hearing. ? Thinking. ? Mood. Summary  An upper respiratory infection (URI) is a common infection of the nose, throat, and upper air passages that lead to the lungs.  A URI is caused by a virus.  URIs usually get better on their own within 7-10 days.  Medicines cannot cure URIs, but your health care provider may recommend certain medicines to help relieve symptoms. This information is not intended to replace advice given to you by your health care provider. Make sure you discuss any questions you have with your health care provider. Document Revised: 07/21/2018 Document Reviewed: 02/26/2017 Elsevier Patient Education  2020 [employer]    

## 2020-07-02 NOTE — Progress Notes (Signed)
Subjective:    Patient ID: Mary Moss, female    DOB: Jan 20, 1936, 84 y.o.   MRN: [id number]  Chief Complaint  Patient presents with  . Medical Management of Chronic Issues  . Sinus Problem    cough started saturday night    Pt presents to the office today for chronic follow up. She is followed by [facility] for chronic back pain. She is followed by Endocrinologists every 6 months for prediabetes Sinus Problem This is a new problem. The current episode started in the past 7 days. The problem has been waxing and waning since onset. There has been no fever. Associated symptoms include congestion, coughing, a hoarse voice, sinus pressure and sneezing. Pertinent negatives include no shortness of breath. Past treatments include acetaminophen. The treatment provided mild relief.  Hypertension This is a chronic problem. The current episode started more than 1 year ago. The problem has been resolved since onset. The problem is controlled. Associated symptoms include anxiety. Pertinent negatives include no malaise/fatigue, peripheral edema or shortness of breath. Risk factors for coronary artery disease include dyslipidemia and obesity. The current treatment provides moderate improvement. There is no history of heart failure.  Back Pain This is a chronic problem. The current episode started more than 1 year ago. The problem has been waxing and waning since onset. The pain is present in the lumbar spine. The pain is at a severity of 5/10. The pain is mild. The symptoms are aggravated by standing. Associated symptoms include weakness. Pertinent negatives include no paresthesias or pelvic pain. She has tried heat and NSAIDs for the symptoms. The treatment provided mild relief.  Arthritis Presents for follow-up visit. She complains of pain and stiffness. Affected locations include the left knee and right knee (back). Her pain is at a severity of 5/10.  Hyperlipidemia This is a chronic problem.  The current episode started more than 1 year ago. The problem is controlled. Recent lipid tests were reviewed and are normal. Exacerbating diseases include obesity. Pertinent negatives include no shortness of breath. Current antihyperlipidemic treatment includes diet change. The current treatment provides moderate improvement of lipids. Risk factors for coronary artery disease include hypertension, dyslipidemia, a sedentary lifestyle and post-menopausal.  Anxiety Presents for follow-up visit. Symptoms include depressed mood, excessive worry and irritability. Patient reports no shortness of breath. Symptoms occur most days. The severity of symptoms is moderate.        Review of Systems  Constitutional: Positive for irritability. Negative for malaise/fatigue.  HENT: Positive for congestion, hoarse voice, sinus pressure and sneezing.   Respiratory: Positive for cough. Negative for shortness of breath.   Genitourinary: Negative for pelvic pain.  Musculoskeletal: Positive for arthritis, back pain and stiffness.  Neurological: Positive for weakness. Negative for paresthesias.  All other systems reviewed and are negative.      Objective:   Physical Exam Vitals reviewed.  Constitutional:      General: She is not in acute distress.    Appearance: She is well-developed. She is obese.  HENT:     Head: Normocephalic and atraumatic.     Right Ear: Tympanic membrane normal.     Left Ear: Tympanic membrane normal.  Eyes:     Pupils: Pupils are equal, round, and reactive to light.  Neck:     Thyroid: No thyromegaly.  Cardiovascular:     Rate and Rhythm: Normal rate and regular rhythm.     Heart sounds: Normal heart sounds. No murmur heard.   Pulmonary:  Effort: Pulmonary effort is normal. No respiratory distress.     Breath sounds: Normal breath sounds. No wheezing.  Abdominal:     General: Bowel sounds are normal. There is no distension.     Palpations: Abdomen is soft.      Tenderness: There is no abdominal tenderness.  Musculoskeletal:        General: No tenderness.     Cervical back: Normal range of motion and neck supple.     Comments: Pain in lumbar with flexion and extension  Skin:    General: Skin is warm and dry.  Neurological:     Mental Status: She is alert and oriented to person, place, and time.     Cranial Nerves: No cranial nerve deficit.     Deep Tendon Reflexes: Reflexes are normal and symmetric.  Psychiatric:        Behavior: Behavior normal.        Thought Content: Thought content normal.        Judgment: Judgment normal.       BP 126/70   Pulse 87   Temp (!) 97.5 F (36.4 C) (Temporal)   Ht _0  (1.727 m)   Wt 206 lb 12.8 oz (93.8 kg)   SpO2 97%   BMI 31.44 kg/m      Assessment & Plan:  Mary Moss comes in today with chief complaint of Medical Management of Chronic Issues and Sinus Problem (cough started saturday night )   Diagnosis and orders addressed:  1. Essential hypertension - lisinopril (ZESTRIL) 10 MG tablet; Take 1 tablet (10 mg total) by mouth daily.  Dispense: 90 tablet; Refill: 2 - hydrochlorothiazide (HYDRODIURIL) 25 MG tablet; Take 1 tablet (25 mg total) by mouth daily.  Dispense: 90 tablet; Refill: 2 - CMP14+EGFR - CBC with Differential/Platelet  2. Osteoarthritis of knee, unspecified laterality, unspecified osteoarthritis type - CMP14+EGFR - CBC with Differential/Platelet  3. GAD (generalized anxiety disorder) - CMP14+EGFR - CBC with Differential/Platelet  4. Hyperlipidemia, unspecified hyperlipidemia type - CMP14+EGFR - CBC with Differential/Platelet  5. Morbid obesity ([place]) - CMP14+EGFR - CBC with Differential/Platelet  6. Vitamin D deficiency - CMP14+EGFR - CBC with Differential/Platelet  7. Pre-diabetes - CMP14+EGFR - CBC with Differential/Platelet  8. Viral URI Will hold off on antibiotics at this time, she will call if symptoms worsen - CMP14+EGFR - CBC with  Differential/Platelet   Labs pending Health Maintenance reviewed Diet and exercise encouraged  Follow up plan: 6 months    Evelina Dun, FNP

## 2020-07-05 ENCOUNTER — Telehealth: Payer: Self-pay

## 2020-07-05 ENCOUNTER — Other Ambulatory Visit: Payer: Self-pay | Admitting: Family Medicine

## 2020-07-05 MED ORDER — AMOXICILLIN-POT CLAVULANATE 875-125 MG PO TABS: 1.0000 | ORAL_TABLET | Freq: Two times a day (BID) | ORAL | 0 refills | Status: DC

## 2020-07-05 NOTE — Telephone Encounter (Signed)
Please let the patient know that I sent their prescription to their pharmacy. Thanks, WS 

## 2020-07-05 NOTE — Telephone Encounter (Signed)
Patient aware.

## 2020-08-07 DIAGNOSIS — Z20822 Contact with and (suspected) exposure to covid-19: Secondary | ICD-10-CM | POA: Diagnosis not present

## 2020-08-26 DIAGNOSIS — Z800 Family history of malignant neoplasm of digestive organs: Secondary | ICD-10-CM | POA: Diagnosis not present

## 2020-08-26 DIAGNOSIS — M8580 Other specified disorders of bone density and structure, unspecified site: Secondary | ICD-10-CM | POA: Diagnosis not present

## 2020-08-26 DIAGNOSIS — I129 Hypertensive chronic kidney disease with stage 1 through stage 4 chronic kidney disease, or unspecified chronic kidney disease: Secondary | ICD-10-CM | POA: Diagnosis not present

## 2020-08-26 DIAGNOSIS — R946 Abnormal results of thyroid function studies: Secondary | ICD-10-CM | POA: Diagnosis not present

## 2020-08-26 DIAGNOSIS — E669 Obesity, unspecified: Secondary | ICD-10-CM | POA: Diagnosis not present

## 2020-08-26 DIAGNOSIS — M5416 Radiculopathy, lumbar region: Secondary | ICD-10-CM | POA: Diagnosis not present

## 2020-08-26 DIAGNOSIS — N1831 Chronic kidney disease, stage 3a: Secondary | ICD-10-CM | POA: Diagnosis not present

## 2020-08-26 DIAGNOSIS — R7309 Other abnormal glucose: Secondary | ICD-10-CM | POA: Diagnosis not present

## 2020-08-26 DIAGNOSIS — E559 Vitamin D deficiency, unspecified: Secondary | ICD-10-CM | POA: Diagnosis not present

## 2020-08-26 DIAGNOSIS — E785 Hyperlipidemia, unspecified: Secondary | ICD-10-CM | POA: Diagnosis not present

## 2020-08-29 DIAGNOSIS — M25552 Pain in left hip: Secondary | ICD-10-CM | POA: Diagnosis not present

## 2020-09-16 ENCOUNTER — Encounter: Payer: Self-pay | Admitting: Family Medicine

## 2020-09-16 ENCOUNTER — Ambulatory Visit (INDEPENDENT_AMBULATORY_CARE_PROVIDER_SITE_OTHER): Payer: Medicare Other | Admitting: Family Medicine

## 2020-09-16 DIAGNOSIS — K640 First degree hemorrhoids: Secondary | ICD-10-CM

## 2020-09-16 MED ORDER — HYDROCORTISONE (PERIANAL) 2.5 % EX CREA: 1.0000 "application " | TOPICAL_CREAM | Freq: Two times a day (BID) | CUTANEOUS | 1 refills | Status: DC

## 2020-09-16 NOTE — Progress Notes (Signed)
Virtual Visit via telephone Note  I connected with Mary Moss on 09/16/20 at 1739 by telephone and verified that I am speaking with the correct person using two identifiers. Mary Moss is currently located at home and patient are currently with her during visit. The provider, Fransisca Kaufmann Dash Cardarelli, MD is located in their office at time of visit.  Call ended at 1747  I discussed the limitations, risks, security and privacy concerns of performing an evaluation and management service by telephone and the availability of in person appointments. I also discussed with the patient that there may be a patient responsible charge related to this service. The patient expressed understanding and agreed to proceed.   History and Present Illness: Patient is calling in for hemorrhoids for the past 4 days and is having pain and burning.  She is not having constipation or diarrhea.  She denies any rectal bleeding.  She has been using preparation H. She feels like they are larger than a pencil eraser.   1. Grade I hemorrhoids     Outpatient Encounter Medications as of 09/16/2020  Medication Sig  . hydrocortisone (PROCTOZONE-HC) 2.5 % rectal cream Place 1 application rectally 2 (two) times daily.  Marland Kitchen amoxicillin-clavulanate (AUGMENTIN) 875-125 MG tablet Take 1 tablet by mouth 2 (two) times daily. Take all of this medication  . Biotin w/ Vitamins C & E (HAIR/SKIN/NAILS PO) Take 3 tablets by mouth daily.  . Calcium-Phosphorus-Vitamin D (CALCIUM GUMMIES PO) Take 2 tablets by mouth daily.  . celecoxib (CELEBREX) 200 MG capsule Take 200 mg by mouth 2 (two) times daily.  . clobetasol cream (TEMOVATE) 5.17 % Apply 1 application topically 2 (two) times daily as needed (vaginal irritation.).   Marland Kitchen D3-50 1.25 MG (50000 UT) capsule Take 50,000 Units by mouth every Sunday.   . hydrochlorothiazide (HYDRODIURIL) 25 MG tablet Take 1 tablet (25 mg total) by mouth daily.  Marland Kitchen HYDROcodone-acetaminophen (NORCO) 5-325 MG  tablet Take 1 tablet by mouth every 6 (six) hours as needed for moderate pain.  Marland Kitchen lisinopril (ZESTRIL) 10 MG tablet Take 1 tablet (10 mg total) by mouth daily.  Marland Kitchen tiZANidine (ZANAFLEX) 4 MG tablet Take 1 tablet (4 mg total) by mouth every 8 (eight) hours as needed for muscle spasms.   No facility-administered encounter medications on file as of 09/16/2020.    Review of Systems  Constitutional: Negative for chills and fever.  Eyes: Negative for visual disturbance.  Respiratory: Negative for chest tightness and shortness of breath.   Cardiovascular: Negative for chest pain and leg swelling.  Gastrointestinal: Negative for anal bleeding, blood in stool, constipation, diarrhea, nausea and vomiting.  Musculoskeletal: Negative for back pain and gait problem.  Skin: Negative for rash.  Neurological: Negative for light-headedness and headaches.  Psychiatric/Behavioral: Negative for agitation and behavioral problems.  All other systems reviewed and are negative.   Observations/Objective: Patient sounds comfortable and in no acute distress  Assessment and Plan: Problem List Items Addressed This Visit   None   Visit Diagnoses    Grade I hemorrhoids    -  Primary   Relevant Medications   hydrocortisone (PROCTOZONE-HC) 2.5 % rectal cream       Follow up plan: Return if symptoms worsen or fail to improve.     I discussed the assessment and treatment plan with the patient. The patient was provided an opportunity to ask questions and all were answered. The patient agreed with the plan and demonstrated an understanding of the instructions.  The patient was advised to call back or seek an in-person evaluation if the symptoms worsen or if the condition fails to improve as anticipated.  The above assessment and management plan was discussed with the patient. The patient verbalized understanding of and has agreed to the management plan. Patient is aware to call the clinic if symptoms persist or  worsen. Patient is aware when to return to the clinic for a follow-up visit. Patient educated on when it is appropriate to go to the emergency department.    I provided 8 minutes of non-face-to-face time during this encounter.    Worthy Rancher, MD

## 2020-09-17 DIAGNOSIS — M25559 Pain in unspecified hip: Secondary | ICD-10-CM | POA: Diagnosis not present

## 2020-09-17 DIAGNOSIS — M533 Sacrococcygeal disorders, not elsewhere classified: Secondary | ICD-10-CM | POA: Diagnosis not present

## 2020-09-20 ENCOUNTER — Other Ambulatory Visit: Payer: Self-pay | Admitting: Family

## 2020-11-04 DIAGNOSIS — L821 Other seborrheic keratosis: Secondary | ICD-10-CM | POA: Diagnosis not present

## 2020-11-04 DIAGNOSIS — Z85828 Personal history of other malignant neoplasm of skin: Secondary | ICD-10-CM | POA: Diagnosis not present

## 2020-11-04 DIAGNOSIS — C44619 Basal cell carcinoma of skin of left upper limb, including shoulder: Secondary | ICD-10-CM | POA: Diagnosis not present

## 2020-11-04 DIAGNOSIS — L570 Actinic keratosis: Secondary | ICD-10-CM | POA: Diagnosis not present

## 2020-11-04 DIAGNOSIS — D1801 Hemangioma of skin and subcutaneous tissue: Secondary | ICD-10-CM | POA: Diagnosis not present

## 2020-11-04 DIAGNOSIS — L814 Other melanin hyperpigmentation: Secondary | ICD-10-CM | POA: Diagnosis not present

## 2020-11-04 DIAGNOSIS — D225 Melanocytic nevi of trunk: Secondary | ICD-10-CM | POA: Diagnosis not present

## 2020-11-05 DIAGNOSIS — M7989 Other specified soft tissue disorders: Secondary | ICD-10-CM | POA: Diagnosis not present

## 2020-11-05 DIAGNOSIS — M79641 Pain in right hand: Secondary | ICD-10-CM | POA: Diagnosis not present

## 2020-12-17 DIAGNOSIS — M2550 Pain in unspecified joint: Secondary | ICD-10-CM | POA: Diagnosis not present

## 2020-12-17 DIAGNOSIS — M150 Primary generalized (osteo)arthritis: Secondary | ICD-10-CM | POA: Diagnosis not present

## 2020-12-17 DIAGNOSIS — Z6831 Body mass index (BMI) 31.0-31.9, adult: Secondary | ICD-10-CM | POA: Diagnosis not present

## 2020-12-17 DIAGNOSIS — R768 Other specified abnormal immunological findings in serum: Secondary | ICD-10-CM | POA: Diagnosis not present

## 2020-12-17 DIAGNOSIS — E790 Hyperuricemia without signs of inflammatory arthritis and tophaceous disease: Secondary | ICD-10-CM | POA: Diagnosis not present

## 2020-12-17 DIAGNOSIS — E669 Obesity, unspecified: Secondary | ICD-10-CM | POA: Diagnosis not present

## 2020-12-17 DIAGNOSIS — R5383 Other fatigue: Secondary | ICD-10-CM | POA: Diagnosis not present

## 2021-01-13 DIAGNOSIS — J329 Chronic sinusitis, unspecified: Secondary | ICD-10-CM | POA: Diagnosis not present

## 2021-01-13 DIAGNOSIS — R0981 Nasal congestion: Secondary | ICD-10-CM | POA: Diagnosis not present

## 2021-01-13 DIAGNOSIS — R059 Cough, unspecified: Secondary | ICD-10-CM | POA: Diagnosis not present

## 2021-02-03 ENCOUNTER — Encounter: Payer: Self-pay | Admitting: Family Medicine

## 2021-02-03 ENCOUNTER — Ambulatory Visit (INDEPENDENT_AMBULATORY_CARE_PROVIDER_SITE_OTHER): Payer: Medicare Other

## 2021-02-03 ENCOUNTER — Ambulatory Visit (INDEPENDENT_AMBULATORY_CARE_PROVIDER_SITE_OTHER): Payer: Medicare Other | Admitting: Family Medicine

## 2021-02-03 VITALS — BP 137/84 | HR 102 | Temp 97.5°F | Ht 68.0 in | Wt 206.8 lb

## 2021-02-03 DIAGNOSIS — R062 Wheezing: Secondary | ICD-10-CM | POA: Diagnosis not present

## 2021-02-03 DIAGNOSIS — R0602 Shortness of breath: Secondary | ICD-10-CM

## 2021-02-03 DIAGNOSIS — U071 COVID-19: Secondary | ICD-10-CM | POA: Diagnosis not present

## 2021-02-03 MED ORDER — ALBUTEROL SULFATE 108 (90 BASE) MCG/ACT IN AEPB: 1.0000 | INHALATION_SPRAY | Freq: Four times a day (QID) | RESPIRATORY_TRACT | 2 refills | Status: DC | PRN

## 2021-02-03 MED ORDER — AMOXICILLIN-POT CLAVULANATE 875-125 MG PO TABS: 1.0000 | ORAL_TABLET | Freq: Two times a day (BID) | ORAL | 0 refills | Status: AC

## 2021-02-03 NOTE — Progress Notes (Signed)
Acute Office Visit  Subjective:    Patient ID: Mary Moss, female    DOB: 12/23/1935, 85 y.o.   MRN: [id number]  Chief Complaint  Patient presents with   Nasal Congestion    HPI Patient is in today for cough and shortness of breath. She was diagnosed with Covid on 01/14/21. She was treated with a zpak, steroids, and plaxlovid. She reports wheezing, congestion that has not gotten any better. She has coughed so hard at times that she has vomited. She reports shortness of breath when walking distances of 25 feet or more. She denies HA or fever. She has been feeling more tired than usual. Denies chest pain.   Past Medical History:  Diagnosis Date   Ankle fracture fx 13 yrs ago, anklw swells off and on, has cramps in ankle also   left, to seee dr hewitt about 02-16-14   Arthritis    Basal cell carcinoma 2015   generalized   GERD (gastroesophageal reflux disease)    OTC med   pt. denies   Hypertension    Pneumonia     Past Surgical History:  Procedure Laterality Date   ABDOMINAL HYSTERECTOMY     EYE SURGERY Bilateral    bilaterla cataract extraction with IOL   FRACTURE SURGERY Left 2001   JOINT REPLACEMENT     KNEE ARTHROSCOPY Right 02/07/2014   Procedure: RIGHT ARTHROSCOPY KNEE, WITH MEDIAL MENISCAL DEBRIDEMENT AND CHONDRAPLASTY;  Surgeon: Gearlean Alf, MD;  Location: [facility];  Service: Orthopedics;  Laterality: Right;   ROTATOR CUFF REPAIR Right 2009   TOTAL ANKLE ARTHROPLASTY Left 12/20/2014   Procedure: LEFT TOTAL ANKLE ARTHOPLASTY WITH PERCUTANEOUS ACHILLES TENDON LENGTHENING;  Surgeon: Wylene Simmer, MD;  Location: [facility];  Service: Orthopedics;  Laterality: Left;   TOTAL KNEE ARTHROPLASTY Right 06/08/2016   Procedure: RIGHT TOTAL KNEE ARTHROPLASTY;  Surgeon: Gaynelle Arabian, MD;  Location: [facility];  Service: Orthopedics;  Laterality: Right;   TOTAL SHOULDER ARTHROPLASTY Right 11/23/2019   Procedure: REVERSE TOTAL SHOULDER ARTHROPLASTY;  Surgeon: Tania Ade, MD;   Location: [facility];  Service: Orthopedics;  Laterality: Right;    Family History  Problem Relation Age of Onset   Cancer Mother        colon   Cancer Father        lung   Cancer Sister        lung cancer-nonsmoker   Alzheimer's disease Brother 24    Social History   Socioeconomic History   Marital status: Widowed    Spouse name: Not on file   Number of children: 0   Years of education: 14   Highest education level: Some college, no degree  Occupational History   Occupation: Management consultant    Comment: retired   Occupation: Warden/ranger    Comment: Retired  Tobacco Use   Smoking status: Former    Packs/day: 0.50    Years: 40.00    Pack years: 20.00    Types: Cigarettes    Start date: 07/27/1957    Quit date: 12/15/2007    Years since quitting: 13.1   Smokeless tobacco: Never  Vaping Use   Vaping Use: Never used  Substance and Sexual Activity   Alcohol use: Not Currently    Comment: rare   Drug use: No   Sexual activity: Not Currently    Birth control/protection: Surgical  Other Topics Concern   Not on file  Social History Narrative   Not on file   Social Determinants  of Health   Financial Resource Strain: Not on file  Food Insecurity: Not on file  Transportation Needs: Not on file  Physical Activity: Not on file  Stress: Not on file  Social Connections: Not on file  Intimate Partner Violence: Not on file    Outpatient Medications Prior to Visit  Medication Sig Dispense Refill   Calcium-Phosphorus-Vitamin D (CALCIUM GUMMIES PO) Take 2 tablets by mouth daily.     D3-50 1.25 MG (50000 UT) capsule Take 50,000 Units by mouth every Sunday.      hydrochlorothiazide (HYDRODIURIL) 25 MG tablet Take 1 tablet (25 mg total) by mouth daily. 90 tablet 2   lisinopril (ZESTRIL) 10 MG tablet Take 1 tablet (10 mg total) by mouth daily. 90 tablet 2   Biotin w/ Vitamins C & E (HAIR/SKIN/NAILS PO) Take 3 tablets by mouth daily.     hydrocortisone (PROCTOZONE-HC)  2.5 % rectal cream Place 1 application rectally 2 (two) times daily. 30 g 1   celecoxib (CELEBREX) 200 MG capsule Take 200 mg by mouth 2 (two) times daily. (Patient not taking: Reported on 02/03/2021)     clobetasol cream (TEMOVATE) 8.36 % Apply 1 application topically 2 (two) times daily as needed (vaginal irritation.).  (Patient not taking: Reported on 02/03/2021)     tiZANidine (ZANAFLEX) 4 MG tablet Take 1 tablet (4 mg total) by mouth every 8 (eight) hours as needed for muscle spasms. (Patient not taking: Reported on 02/03/2021) 30 tablet 1   amoxicillin-clavulanate (AUGMENTIN) 875-125 MG tablet Take 1 tablet by mouth 2 (two) times daily. Take all of this medication 20 tablet 0   HYDROcodone-acetaminophen (NORCO) 5-325 MG tablet Take 1 tablet by mouth every 6 (six) hours as needed for moderate pain. (Patient not taking: Reported on 02/03/2021) 20 tablet 0   No facility-administered medications prior to visit.    Allergies  Allergen Reactions   Statins     Simvastatin, crestor, livalo Not able to walk because so much muscle/joint pain   Neosporin [Neomycin-Bacitracin Zn-Polymyx] Rash   Polysporin [Bacitracin-Polymyxin B] Rash    Review of Systems As per HPI.     Objective:    Physical Exam Vitals and nursing note reviewed.  Constitutional:      General: She is not in acute distress.    Appearance: She is not ill-appearing, toxic-appearing or diaphoretic.  Cardiovascular:     Rate and Rhythm: Normal rate and regular rhythm.     Heart sounds: Normal heart sounds. No murmur heard. Pulmonary:     Effort: No respiratory distress.     Breath sounds: Examination of the right-upper field reveals wheezing and rhonchi. Examination of the left-upper field reveals wheezing and rhonchi. Examination of the right-middle field reveals wheezing and rhonchi. Examination of the left-middle field reveals wheezing and rhonchi. Examination of the right-lower field reveals wheezing and rhonchi. Examination  of the left-lower field reveals wheezing and rhonchi. Wheezing and rhonchi present.  Musculoskeletal:     Right lower leg: No edema.     Left lower leg: No edema.  Skin:    General: Skin is warm and dry.  Neurological:     General: No focal deficit present.     Mental Status: She is alert and oriented to person, place, and time.  Psychiatric:        Mood and Affect: Mood normal.        Behavior: Behavior normal.    BP 137/84   Pulse (!) 102   Temp (!) 97.5 F (  36.4 C)   Ht 5\' 8"  (1.727 m)   Wt 206 lb 12.8 oz (93.8 kg)   SpO2 93%   BMI 31.44 kg/m  Wt Readings from Last 3 Encounters:  02/03/21 206 lb 12.8 oz (93.8 kg)  07/02/20 206 lb 12.8 oz (93.8 kg)  11/23/19 214 lb 6.4 oz (97.3 kg)    Health Maintenance Due  Topic Date Due   TETANUS/TDAP  Never done   Zoster Vaccines- Shingrix (1 of 2) Never done   COVID-19 Vaccine (3 - Booster for Moderna series) 02/13/2020    There are no preventive care reminders to display for this patient.   Lab Results  Component Value Date   TSH 3.040 08/05/2015   Lab Results  Component Value Date   WBC 8.5 07/02/2020   HGB 13.4 07/02/2020   HCT 39.5 07/02/2020   MCV 93 07/02/2020   PLT 266 07/02/2020   Lab Results  Component Value Date   NA 140 07/02/2020   K 4.7 07/02/2020   CO2 26 07/02/2020   GLUCOSE 110 (H) 07/02/2020   BUN 17 07/02/2020   CREATININE 0.84 07/02/2020   BILITOT 0.6 07/02/2020   ALKPHOS 97 07/02/2020   AST 15 07/02/2020   ALT 10 07/02/2020   PROT 6.7 07/02/2020   ALBUMIN 4.2 07/02/2020   CALCIUM 9.6 07/02/2020   ANIONGAP 8 11/24/2019   Lab Results  Component Value Date   CHOL 239 (H) 07/03/2015   Lab Results  Component Value Date   HDL 66 07/03/2015   Lab Results  Component Value Date   LDLCALC 155 (H) 07/03/2015   Lab Results  Component Value Date   TRIG 91 07/03/2015   Lab Results  Component Value Date   CHOLHDL 3.6 07/03/2015   Lab Results  Component Value Date   HGBA1C 7.0 (H)  08/03/2016       Assessment & Plan:   [place] was seen today for nasal congestion.  Diagnoses and all orders for this visit:  COVID-19 Shortness of breath CXR today in office, radiology report pending. Augmentin as below. Albuterol as needed. Wheezing and rhonchi on exam, no repiratory distress. Mucinex for congestion.  -     DG Chest 2 View -     amoxicillin-clavulanate (AUGMENTIN) 875-125 MG tablet; Take 1 tablet by mouth 2 (two) times daily for 10 days. -     Albuterol Sulfate (PROAIR RESPICLICK) 222 (90 Base) MCG/ACT AEPB; Inhale 1-2 puffs into the lungs every 6 (six) hours as needed (shortness of breath, wheezing).   Return to office for new or worsening symptoms, or if symptoms persist.   The patient indicates understanding of these issues and agrees with the plan.   Gwenlyn Perking, FNP

## 2021-02-04 ENCOUNTER — Ambulatory Visit: Payer: Medicare Other | Admitting: Family Medicine

## 2021-02-11 DIAGNOSIS — E790 Hyperuricemia without signs of inflammatory arthritis and tophaceous disease: Secondary | ICD-10-CM | POA: Diagnosis not present

## 2021-02-11 DIAGNOSIS — Z6830 Body mass index (BMI) 30.0-30.9, adult: Secondary | ICD-10-CM | POA: Diagnosis not present

## 2021-02-11 DIAGNOSIS — E669 Obesity, unspecified: Secondary | ICD-10-CM | POA: Diagnosis not present

## 2021-02-11 DIAGNOSIS — M0579 Rheumatoid arthritis with rheumatoid factor of multiple sites without organ or systems involvement: Secondary | ICD-10-CM | POA: Diagnosis not present

## 2021-02-11 DIAGNOSIS — M150 Primary generalized (osteo)arthritis: Secondary | ICD-10-CM | POA: Diagnosis not present

## 2021-02-11 DIAGNOSIS — M2550 Pain in unspecified joint: Secondary | ICD-10-CM | POA: Diagnosis not present

## 2021-02-18 DIAGNOSIS — R7309 Other abnormal glucose: Secondary | ICD-10-CM | POA: Diagnosis not present

## 2021-02-18 DIAGNOSIS — R946 Abnormal results of thyroid function studies: Secondary | ICD-10-CM | POA: Diagnosis not present

## 2021-02-18 DIAGNOSIS — E559 Vitamin D deficiency, unspecified: Secondary | ICD-10-CM | POA: Diagnosis not present

## 2021-02-18 DIAGNOSIS — E785 Hyperlipidemia, unspecified: Secondary | ICD-10-CM | POA: Diagnosis not present

## 2021-02-20 DIAGNOSIS — Z1231 Encounter for screening mammogram for malignant neoplasm of breast: Secondary | ICD-10-CM | POA: Diagnosis not present

## 2021-02-25 DIAGNOSIS — R82998 Other abnormal findings in urine: Secondary | ICD-10-CM | POA: Diagnosis not present

## 2021-02-25 DIAGNOSIS — E559 Vitamin D deficiency, unspecified: Secondary | ICD-10-CM | POA: Diagnosis not present

## 2021-02-25 DIAGNOSIS — Z0000 Encounter for general adult medical examination without abnormal findings: Secondary | ICD-10-CM | POA: Diagnosis not present

## 2021-02-25 DIAGNOSIS — R051 Acute cough: Secondary | ICD-10-CM | POA: Diagnosis not present

## 2021-02-25 DIAGNOSIS — M8580 Other specified disorders of bone density and structure, unspecified site: Secondary | ICD-10-CM | POA: Diagnosis not present

## 2021-02-25 DIAGNOSIS — R946 Abnormal results of thyroid function studies: Secondary | ICD-10-CM | POA: Diagnosis not present

## 2021-02-25 DIAGNOSIS — N1831 Chronic kidney disease, stage 3a: Secondary | ICD-10-CM | POA: Diagnosis not present

## 2021-02-25 DIAGNOSIS — I129 Hypertensive chronic kidney disease with stage 1 through stage 4 chronic kidney disease, or unspecified chronic kidney disease: Secondary | ICD-10-CM | POA: Diagnosis not present

## 2021-02-25 DIAGNOSIS — E669 Obesity, unspecified: Secondary | ICD-10-CM | POA: Diagnosis not present

## 2021-02-25 DIAGNOSIS — R768 Other specified abnormal immunological findings in serum: Secondary | ICD-10-CM | POA: Diagnosis not present

## 2021-02-25 DIAGNOSIS — Z1331 Encounter for screening for depression: Secondary | ICD-10-CM | POA: Diagnosis not present

## 2021-02-25 DIAGNOSIS — E785 Hyperlipidemia, unspecified: Secondary | ICD-10-CM | POA: Diagnosis not present

## 2021-02-25 DIAGNOSIS — M5416 Radiculopathy, lumbar region: Secondary | ICD-10-CM | POA: Diagnosis not present

## 2021-03-20 DIAGNOSIS — R059 Cough, unspecified: Secondary | ICD-10-CM | POA: Diagnosis not present

## 2021-03-24 DIAGNOSIS — M2550 Pain in unspecified joint: Secondary | ICD-10-CM | POA: Diagnosis not present

## 2021-03-24 DIAGNOSIS — E790 Hyperuricemia without signs of inflammatory arthritis and tophaceous disease: Secondary | ICD-10-CM | POA: Diagnosis not present

## 2021-03-24 DIAGNOSIS — M150 Primary generalized (osteo)arthritis: Secondary | ICD-10-CM | POA: Diagnosis not present

## 2021-03-24 DIAGNOSIS — E669 Obesity, unspecified: Secondary | ICD-10-CM | POA: Diagnosis not present

## 2021-03-24 DIAGNOSIS — M0579 Rheumatoid arthritis with rheumatoid factor of multiple sites without organ or systems involvement: Secondary | ICD-10-CM | POA: Diagnosis not present

## 2021-03-24 DIAGNOSIS — Z6830 Body mass index (BMI) 30.0-30.9, adult: Secondary | ICD-10-CM | POA: Diagnosis not present

## 2021-03-30 ENCOUNTER — Other Ambulatory Visit: Payer: Self-pay | Admitting: Family

## 2021-03-30 DIAGNOSIS — I10 Essential (primary) hypertension: Secondary | ICD-10-CM

## 2021-04-03 DIAGNOSIS — M0579 Rheumatoid arthritis with rheumatoid factor of multiple sites without organ or systems involvement: Secondary | ICD-10-CM | POA: Diagnosis not present

## 2021-04-29 ENCOUNTER — Telehealth: Payer: Self-pay | Admitting: Family

## 2021-04-29 NOTE — Telephone Encounter (Signed)
Patient stated that Dr Forde Dandy completes her AWVS

## 2021-05-01 DIAGNOSIS — M0579 Rheumatoid arthritis with rheumatoid factor of multiple sites without organ or systems involvement: Secondary | ICD-10-CM | POA: Diagnosis not present

## 2021-05-06 DIAGNOSIS — L218 Other seborrheic dermatitis: Secondary | ICD-10-CM | POA: Diagnosis not present

## 2021-05-06 DIAGNOSIS — D225 Melanocytic nevi of trunk: Secondary | ICD-10-CM | POA: Diagnosis not present

## 2021-05-06 DIAGNOSIS — L570 Actinic keratosis: Secondary | ICD-10-CM | POA: Diagnosis not present

## 2021-05-06 DIAGNOSIS — D692 Other nonthrombocytopenic purpura: Secondary | ICD-10-CM | POA: Diagnosis not present

## 2021-05-06 DIAGNOSIS — L821 Other seborrheic keratosis: Secondary | ICD-10-CM | POA: Diagnosis not present

## 2021-05-06 DIAGNOSIS — D1801 Hemangioma of skin and subcutaneous tissue: Secondary | ICD-10-CM | POA: Diagnosis not present

## 2021-05-06 DIAGNOSIS — Z85828 Personal history of other malignant neoplasm of skin: Secondary | ICD-10-CM | POA: Diagnosis not present

## 2021-05-06 DIAGNOSIS — L814 Other melanin hyperpigmentation: Secondary | ICD-10-CM | POA: Diagnosis not present

## 2021-05-22 DIAGNOSIS — Z961 Presence of intraocular lens: Secondary | ICD-10-CM | POA: Diagnosis not present

## 2021-06-25 DIAGNOSIS — M150 Primary generalized (osteo)arthritis: Secondary | ICD-10-CM | POA: Diagnosis not present

## 2021-06-25 DIAGNOSIS — E669 Obesity, unspecified: Secondary | ICD-10-CM | POA: Diagnosis not present

## 2021-06-25 DIAGNOSIS — Z79899 Other long term (current) drug therapy: Secondary | ICD-10-CM | POA: Diagnosis not present

## 2021-06-25 DIAGNOSIS — M5432 Sciatica, left side: Secondary | ICD-10-CM | POA: Diagnosis not present

## 2021-06-25 DIAGNOSIS — M0579 Rheumatoid arthritis with rheumatoid factor of multiple sites without organ or systems involvement: Secondary | ICD-10-CM | POA: Diagnosis not present

## 2021-06-25 DIAGNOSIS — M545 Low back pain, unspecified: Secondary | ICD-10-CM | POA: Diagnosis not present

## 2021-06-25 DIAGNOSIS — M2550 Pain in unspecified joint: Secondary | ICD-10-CM | POA: Diagnosis not present

## 2021-06-25 DIAGNOSIS — Z6831 Body mass index (BMI) 31.0-31.9, adult: Secondary | ICD-10-CM | POA: Diagnosis not present

## 2021-06-25 DIAGNOSIS — E790 Hyperuricemia without signs of inflammatory arthritis and tophaceous disease: Secondary | ICD-10-CM | POA: Diagnosis not present

## 2021-06-26 ENCOUNTER — Other Ambulatory Visit: Payer: Self-pay

## 2021-06-26 ENCOUNTER — Ambulatory Visit (INDEPENDENT_AMBULATORY_CARE_PROVIDER_SITE_OTHER): Payer: Medicare Other

## 2021-06-26 DIAGNOSIS — Z23 Encounter for immunization: Secondary | ICD-10-CM | POA: Diagnosis not present

## 2021-06-26 DIAGNOSIS — M0579 Rheumatoid arthritis with rheumatoid factor of multiple sites without organ or systems involvement: Secondary | ICD-10-CM | POA: Diagnosis not present

## 2021-07-17 ENCOUNTER — Ambulatory Visit (INDEPENDENT_AMBULATORY_CARE_PROVIDER_SITE_OTHER): Payer: Medicare Other | Admitting: Family Medicine

## 2021-07-17 ENCOUNTER — Ambulatory Visit (INDEPENDENT_AMBULATORY_CARE_PROVIDER_SITE_OTHER): Payer: Medicare Other

## 2021-07-17 ENCOUNTER — Encounter: Payer: Self-pay | Admitting: Family Medicine

## 2021-07-17 VITALS — BP 151/75 | HR 113 | Temp 101.6°F

## 2021-07-17 DIAGNOSIS — J9811 Atelectasis: Secondary | ICD-10-CM | POA: Diagnosis not present

## 2021-07-17 DIAGNOSIS — J069 Acute upper respiratory infection, unspecified: Secondary | ICD-10-CM | POA: Diagnosis not present

## 2021-07-17 DIAGNOSIS — R059 Cough, unspecified: Secondary | ICD-10-CM | POA: Diagnosis not present

## 2021-07-17 DIAGNOSIS — I700 Atherosclerosis of aorta: Secondary | ICD-10-CM | POA: Diagnosis not present

## 2021-07-17 DIAGNOSIS — R051 Acute cough: Secondary | ICD-10-CM

## 2021-07-17 DIAGNOSIS — J40 Bronchitis, not specified as acute or chronic: Secondary | ICD-10-CM

## 2021-07-17 DIAGNOSIS — J329 Chronic sinusitis, unspecified: Secondary | ICD-10-CM | POA: Diagnosis not present

## 2021-07-17 MED ORDER — LEVOFLOXACIN 500 MG PO TABS: 500.0000 mg | ORAL_TABLET | Freq: Every day | ORAL | 0 refills | Status: DC

## 2021-07-17 MED ORDER — CEFTRIAXONE SODIUM 1 G IJ SOLR
1.0000 g | Freq: Once | INTRAMUSCULAR | Status: AC
Start: 2021-07-17 — End: 2021-07-17
  Administered 2021-07-17: 17:00:00 1 g via INTRAMUSCULAR

## 2021-07-17 MED ORDER — BETAMETHASONE SOD PHOS & ACET 6 (3-3) MG/ML IJ SUSP
6.0000 mg | Freq: Once | INTRAMUSCULAR | Status: AC
  Administered 2021-07-17: 17:00:00 6 mg via INTRAMUSCULAR

## 2021-07-17 MED ORDER — PREDNISONE 10 MG PO TABS: ORAL_TABLET | ORAL | 0 refills | Status: DC

## 2021-07-17 NOTE — Addendum Note (Signed)
Addended by: Christia Reading on: 07/17/2021 05:18 PM   Modules accepted: Orders

## 2021-07-17 NOTE — Progress Notes (Signed)
Chief Complaint  Patient presents with   Cough   Nasal Congestion    HPI  Patient presents today for Patient presents with upper respiratory congestion. Rhinorrhea that is frequently purulent. There is moderate sore throat. Patient reports coughing frequently as well.  Yellow sputum noted. There is  fever, chills or sweats. The patient reports being very short of breath. Onset was 3-5 days ago. Got much worse yesterday. Tried OTCs without improvement.  PMH: Smoking status noted ROS: Review of Systems  Constitutional:  Negative for activity change, appetite change, chills and fever.  HENT:  Positive for congestion, postnasal drip, rhinorrhea and sinus pressure. Negative for ear discharge, ear pain, hearing loss, nosebleeds, sneezing and trouble swallowing.   Respiratory:  Positive for cough, chest tightness and shortness of breath.   Cardiovascular:  Negative for chest pain and palpitations.  Skin:  Negative for rash.    Objective: BP (!) 151/75    Pulse (!) 113    Temp (!) 101.6 F (38.7 C)    SpO2 (!) 88%  Gen: NAD, alert, cooperative with exam HEENT: NCAT, Nasal passages swollen, red TMS clear CV: RRR, good S1/S2, no murmur Resp: Bronchitis changes with scattered wheezes, non-labored Ext: No edema, warm Neuro: Alert and oriented, No gross deficits  Assessment and plan:  1. Sinobronchitis   2. URI with cough and congestion   3. Acute cough     Meds ordered this encounter  Medications   predniSONE (DELTASONE) 10 MG tablet    Sig: Take 5 daily for 2 days followed by 4,3,2 and 1 for 2 days each.    Dispense:  30 tablet    Refill:  0   levofloxacin (LEVAQUIN) 500 MG tablet    Sig: Take 1 tablet (500 mg total) by mouth daily. For 10 days    Dispense:  10 tablet    Refill:  0    Orders Placed This Encounter  Procedures   Novel Coronavirus, NAA ([facility])    Order Specific Question:   Previously tested for COVID-19    Answer:   No    Order Specific Question:   Resident  in a congregate (group) care setting    Answer:   No    Order Specific Question:   Is the patient student?    Answer:   No    Order Specific Question:   Employed in healthcare setting    Answer:   No    Order Specific Question:   Pregnant    Answer:   No    Order Specific Question:   Has patient completed COVID vaccination(s) (2 doses of Pfizer/Moderna 1 dose of [employer])    Answer:   Unknown   DG Chest 2 View    Standing Status:   Future    Number of Occurrences:   1    Standing Expiration Date:   08/17/2021    Order Specific Question:   Reason for Exam (SYMPTOM  OR DIAGNOSIS REQUIRED)    Answer:   cough, dyspnea    Order Specific Question:   Preferred imaging location?    Answer:   Internal   Veritor Flu A/B Waived    Order Specific Question:   Source    Answer:   nasopharangeal    Follow up as needed.  Claretta Fraise, MD

## 2021-07-18 LAB — VERITOR FLU A/B WAIVED
Influenza A: NEGATIVE
Influenza B: NEGATIVE

## 2021-07-18 LAB — NOVEL CORONAVIRUS, NAA: SARS-CoV-2, NAA: NOT DETECTED

## 2021-07-18 LAB — SARS-COV-2, NAA 2 DAY TAT

## 2021-07-29 ENCOUNTER — Ambulatory Visit (INDEPENDENT_AMBULATORY_CARE_PROVIDER_SITE_OTHER): Payer: Medicare Other | Admitting: Nurse Practitioner

## 2021-07-29 ENCOUNTER — Encounter: Payer: Self-pay | Admitting: Nurse Practitioner

## 2021-07-29 VITALS — BP 127/70 | HR 92 | Temp 97.9°F | Resp 20 | Ht 68.0 in | Wt 207.0 lb

## 2021-07-29 DIAGNOSIS — R051 Acute cough: Secondary | ICD-10-CM | POA: Diagnosis not present

## 2021-07-29 MED ORDER — PREDNISONE 20 MG PO TABS: 40.0000 mg | ORAL_TABLET | Freq: Every day | ORAL | 0 refills | Status: AC

## 2021-07-29 MED ORDER — AZITHROMYCIN 250 MG PO TABS: ORAL_TABLET | ORAL | 0 refills | Status: DC

## 2021-07-29 MED ORDER — HYDROCODONE BIT-HOMATROP MBR 5-1.5 MG/5ML PO SOLN: 5.0000 mL | Freq: Four times a day (QID) | ORAL | 0 refills | Status: DC | PRN

## 2021-07-29 NOTE — Progress Notes (Addendum)
° °  Subjective:    Patient ID: Mary Moss, female    DOB: [birth year], 86 y.o.   MRN: [id number]  Chief Complaint: cough and congestion  HPI Patient was seen on 07/17/21 with cough and congestion. She was given levaquin and prednisone. She says that she is no better. She is still coughing and congested. Sh ehas been using her albuterol inhaler.    Review of Systems     Objective:   Physical Exam Constitutional:      Appearance: Normal appearance. She is normal weight.  HENT:     Right Ear: Tympanic membrane normal.     Left Ear: Tympanic membrane normal.     Nose: Nose normal.     Mouth/Throat:     Mouth: Mucous membranes are dry.  Cardiovascular:     Rate and Rhythm: Normal rate and regular rhythm.     Heart sounds: Normal heart sounds.  Pulmonary:     Effort: Pulmonary effort is normal.     Breath sounds: Wheezing (bil lower lobes on insp and exp) present.  Skin:    General: Skin is warm and dry.  Neurological:     General: No focal deficit present.     Mental Status: She is alert and oriented to person, place, and time.  Psychiatric:        Mood and Affect: Mood normal.        Behavior: Behavior normal.    BP 127/70    Pulse 92    Temp 97.9 F (36.6 C) (Temporal)    Resp 20    Ht 5\' 8"  (1.727 m)    Wt 207 lb (93.9 kg)    SpO2 92%    BMI 31.47 kg/m        Assessment & Plan:  Mary Moss in today with chief complaint of Cough and Nasal Congestion   1. Acute cough Force fluids Rest Run humidifier Mucinex during the day and hycodan at night\sedation precaution with hycodan- patient says she has taken before. - HYDROcodone bit-homatropine (HYCODAN) 5-1.5 MG/5ML syrup; Take 5 mLs by mouth every 6 (six) hours as needed for cough.  Dispense: 120 mL; Refill: 0 - predniSONE (DELTASONE) 20 MG tablet; Take 2 tablets (40 mg total) by mouth daily with breakfast for 5 days. 2 po daily for 5 days  Dispense: 10 tablet; Refill: 0 - azithromycin (ZITHROMAX Z-PAK)  250 MG tablet; As directed  Dispense: 6 tablet; Refill: 0    The above assessment and management plan was discussed with the patient. The patient verbalized understanding of and has agreed to the management plan. Patient is aware to call the clinic if symptoms persist or worsen. Patient is aware when to return to the clinic for a follow-up visit. Patient educated on when it is appropriate to go to the emergency department.   Mary-Margaret Hassell Done, FNP

## 2021-07-29 NOTE — Patient Instructions (Signed)

## 2021-08-06 ENCOUNTER — Encounter: Payer: Self-pay | Admitting: Family Medicine

## 2021-08-06 ENCOUNTER — Ambulatory Visit (INDEPENDENT_AMBULATORY_CARE_PROVIDER_SITE_OTHER): Payer: Medicare Other | Admitting: Family Medicine

## 2021-08-06 VITALS — BP 141/75 | HR 104 | Ht 68.0 in | Wt 206.0 lb

## 2021-08-06 DIAGNOSIS — J441 Chronic obstructive pulmonary disease with (acute) exacerbation: Secondary | ICD-10-CM | POA: Diagnosis not present

## 2021-08-06 DIAGNOSIS — B3731 Acute candidiasis of vulva and vagina: Secondary | ICD-10-CM

## 2021-08-06 MED ORDER — CEFTRIAXONE SODIUM 1 G IJ SOLR
1.0000 g | Freq: Once | INTRAMUSCULAR | Status: AC
  Administered 2021-08-06: 1 g via INTRAMUSCULAR

## 2021-08-06 MED ORDER — METHYLPREDNISOLONE ACETATE 80 MG/ML IJ SUSP
80.0000 mg | Freq: Once | INTRAMUSCULAR | Status: AC
  Administered 2021-08-06: 80 mg via INTRAMUSCULAR

## 2021-08-06 MED ORDER — PREDNISONE 20 MG PO TABS: ORAL_TABLET | ORAL | 0 refills | Status: DC

## 2021-08-06 MED ORDER — FLUCONAZOLE 150 MG PO TABS: 150.0000 mg | ORAL_TABLET | Freq: Once | ORAL | 0 refills | Status: AC

## 2021-08-06 MED ORDER — AMOXICILLIN-POT CLAVULANATE 875-125 MG PO TABS: 1.0000 | ORAL_TABLET | Freq: Two times a day (BID) | ORAL | 0 refills | Status: DC

## 2021-08-06 NOTE — Progress Notes (Signed)
BP (!) 141/75    Pulse (!) 104    Ht 5\' 8"  (1.727 m)    Wt 206 lb (93.4 kg)    SpO2 93%    BMI 31.32 kg/m    Subjective:   Patient ID: Mary Moss, female    DOB: May 09, 1936, 86 y.o.   MRN: [id number]  HPI: Mary Moss is a 86 y.o. female presenting on 08/06/2021 for Shortness of Breath, Wheezing, Cough, and Vaginal Itching (Requesting diflucan)   HPI Patient is coming in for cough and congestion wheeze.  She has been fighting this since about 3 weeks ago in December.  She was treated with Levaquin and azithromycin at a different time and to courses of steroids and is using her ProAir and they all seem to help a little bit but not clearing it.  She had a chest x-ray on 12/22 which showed COPD changes but no pneumonia.  Relevant past medical, surgical, family and social history reviewed and updated as indicated. Interim medical history since our last visit reviewed. Allergies and medications reviewed and updated.  Review of Systems  Constitutional:  Negative for chills and fever.  HENT:  Positive for congestion, postnasal drip, rhinorrhea, sinus pressure, sneezing and sore throat. Negative for ear discharge and ear pain.   Eyes:  Negative for pain, redness and visual disturbance.  Respiratory:  Positive for cough, shortness of breath and wheezing. Negative for chest tightness.   Cardiovascular:  Negative for chest pain and leg swelling.  Genitourinary:  Negative for difficulty urinating and dysuria.  Musculoskeletal:  Negative for back pain and gait problem.  Skin:  Negative for rash.  Neurological:  Negative for light-headedness and headaches.  Psychiatric/Behavioral:  Negative for agitation and behavioral problems.   All other systems reviewed and are negative.  Per HPI unless specifically indicated above   Allergies as of 08/06/2021       Reactions   Statins    Simvastatin, crestor, livalo Not able to walk because so much muscle/joint pain   Neosporin  [neomycin-bacitracin Zn-polymyx] Rash   Polysporin [bacitracin-polymyxin B] Rash        Medication List        Accurate as of August 06, 2021  1:41 PM. If you have any questions, ask your nurse or doctor.          STOP taking these medications    azithromycin 250 MG tablet Commonly known as: Zithromax Z-Pak Stopped by: Fransisca Kaufmann Kynan Peasley, MD   hydrochlorothiazide 25 MG tablet Commonly known as: HYDRODIURIL Stopped by: Fransisca Kaufmann Dyamon Sosinski, MD   HYDROcodone bit-homatropine 5-1.5 MG/5ML syrup Commonly known as: HYCODAN Stopped by: Fransisca Kaufmann Tashan Kreitzer, MD       TAKE these medications    Albuterol Sulfate 108 (90 Base) MCG/ACT Aepb Commonly known as: PROAIR RESPICLICK Inhale 1-2 puffs into the lungs every 6 (six) hours as needed (shortness of breath, wheezing).   amoxicillin-clavulanate 875-125 MG tablet Commonly known as: AUGMENTIN Take 1 tablet by mouth 2 (two) times daily. Started by: Fransisca Kaufmann Cydnee Fuquay, MD   CALCIUM GUMMIES PO Take 2 tablets by mouth daily.   celecoxib 200 MG capsule Commonly known as: CELEBREX Take 200 mg by mouth 2 (two) times daily.   clobetasol cream 0.05 % Commonly known as: TEMOVATE Apply 1 application topically 2 (two) times daily as needed (vaginal irritation.).   D3-50 1.25 MG (50000 UT) capsule Generic drug: Cholecalciferol Take 50,000 Units by mouth every Sunday.   fluconazole 150 MG tablet  Commonly known as: Diflucan Take 1 tablet (150 mg total) by mouth once for 1 dose. Started by: Fransisca Kaufmann Wolfgang Finigan, MD   lisinopril 10 MG tablet Commonly known as: ZESTRIL TAKE 1 TABLET BY MOUTH EVERY DAY   predniSONE 20 MG tablet Commonly known as: DELTASONE Take 3 tabs daily for 1 week, then 2 tabs daily for week 2, then 1 tab daily for week 3. Started by: Worthy Rancher, MD   tiZANidine 4 MG tablet Commonly known as: Zanaflex Take 1 tablet (4 mg total) by mouth every 8 (eight) hours as needed for muscle spasms.          Objective:   BP (!) 141/75    Pulse (!) 104    Ht 5\' 8"  (1.727 m)    Wt 206 lb (93.4 kg)    SpO2 93%    BMI 31.32 kg/m   Wt Readings from Last 3 Encounters:  08/06/21 206 lb (93.4 kg)  07/29/21 207 lb (93.9 kg)  02/03/21 206 lb 12.8 oz (93.8 kg)    Physical Exam Vitals reviewed.  Constitutional:      General: She is not in acute distress.    Appearance: She is well-developed. She is not diaphoretic.  HENT:     Right Ear: Tympanic membrane, ear canal and external ear normal.     Left Ear: Tympanic membrane, ear canal and external ear normal.     Nose: Mucosal edema and rhinorrhea present.     Right Sinus: No maxillary sinus tenderness or frontal sinus tenderness.     Left Sinus: No maxillary sinus tenderness or frontal sinus tenderness.     Mouth/Throat:     Pharynx: Uvula midline. Posterior oropharyngeal erythema present. No oropharyngeal exudate.     Tonsils: No tonsillar abscesses.  Eyes:     Conjunctiva/sclera: Conjunctivae normal.  Cardiovascular:     Rate and Rhythm: Normal rate and regular rhythm.     Heart sounds: Normal heart sounds. No murmur heard. Pulmonary:     Effort: Pulmonary effort is normal. No respiratory distress.     Breath sounds: Wheezing and rhonchi present. No rales.  Chest:     Chest wall: No tenderness.  Musculoskeletal:        General: No tenderness. Normal range of motion.  Skin:    General: Skin is warm and dry.     Findings: No rash.  Neurological:     Mental Status: She is alert and oriented to person, place, and time.     Coordination: Coordination normal.  Psychiatric:        Behavior: Behavior normal.      Assessment & Plan:   Problem List Items Addressed This Visit   None Visit Diagnoses     COPD exacerbation ([place])    -  Primary   Relevant Medications   cefTRIAXone (ROCEPHIN) injection 1 g (Start on 08/06/2021  1:45 PM)   methylPREDNISolone acetate (DEPO-MEDROL) injection 80 mg (Start on 08/06/2021  1:45 PM)    predniSONE (DELTASONE) 20 MG tablet   amoxicillin-clavulanate (AUGMENTIN) 875-125 MG tablet   Yeast vaginitis       Relevant Medications   cefTRIAXone (ROCEPHIN) injection 1 g (Start on 08/06/2021  1:45 PM)   fluconazole (DIFLUCAN) 150 MG tablet       Gave sample of breztri for patient 2 puffs twice a day. Follow up plan: Return if symptoms worsen or fail to improve.  Counseling provided for all of the vaccine components No orders of the  defined types were placed in this encounter.   Caryl Pina, MD [facility] 08/06/2021, 1:41 PM

## 2021-08-07 ENCOUNTER — Ambulatory Visit: Payer: Medicare Other | Admitting: Family Medicine

## 2021-08-09 DIAGNOSIS — M5136 Other intervertebral disc degeneration, lumbar region: Secondary | ICD-10-CM | POA: Insufficient documentation

## 2021-10-08 ENCOUNTER — Encounter: Payer: Self-pay | Admitting: Family Medicine

## 2021-10-08 ENCOUNTER — Ambulatory Visit (INDEPENDENT_AMBULATORY_CARE_PROVIDER_SITE_OTHER): Payer: Medicare Other

## 2021-10-08 ENCOUNTER — Ambulatory Visit (INDEPENDENT_AMBULATORY_CARE_PROVIDER_SITE_OTHER): Payer: Medicare Other | Admitting: Family Medicine

## 2021-10-08 VITALS — BP 128/78 | HR 104 | Temp 97.2°F | Ht 68.0 in | Wt 203.0 lb

## 2021-10-08 DIAGNOSIS — R0981 Nasal congestion: Secondary | ICD-10-CM

## 2021-10-08 DIAGNOSIS — J441 Chronic obstructive pulmonary disease with (acute) exacerbation: Secondary | ICD-10-CM

## 2021-10-08 DIAGNOSIS — R051 Acute cough: Secondary | ICD-10-CM | POA: Diagnosis not present

## 2021-10-08 MED ORDER — BREZTRI AEROSPHERE 160-9-4.8 MCG/ACT IN AERO: 2.0000 | INHALATION_SPRAY | Freq: Two times a day (BID) | RESPIRATORY_TRACT | 1 refills | Status: DC

## 2021-10-08 MED ORDER — PREDNISONE 20 MG PO TABS: ORAL_TABLET | ORAL | 0 refills | Status: DC

## 2021-10-08 MED ORDER — METHYLPREDNISOLONE ACETATE 40 MG/ML IJ SUSP
80.0000 mg | Freq: Once | INTRAMUSCULAR | Status: AC
  Administered 2021-10-08: 80 mg via INTRAMUSCULAR

## 2021-10-08 NOTE — Progress Notes (Signed)
? ?BP 128/78   Pulse (!) 104   Temp (!) 97.2 ?F (36.2 ?C)   Ht '5\' 8"'$  (1.727 m)   Wt 203 lb (92.1 kg)   SpO2 94%   BMI 30.87 kg/m?   ? ?Subjective:  ? ?Patient ID: Mary Moss, female    DOB: May 16, 1936, 86 y.o.   MRN: [id number] ? ?HPI: ?Mary Moss is a 86 y.o. female presenting on 10/08/2021 for URI (Cough, congestion, wheezing, sob) ? ? ?HPI ?Patient is having symptoms like she was having before.  She was treated in December for possible COPD exacerbation.  Chest x-ray showed COPD changes.  She was better or at least mostly better after treating with antibiotics and steroids but she does feel like its been coming back.  She denies any fevers or chills but she does have cough and shortness of breath and wheezing and nasal and chest congestion.  She is having a productive cough.  She does have an albuterol inhaler but she said the samples she was given before for [facility] and the ones that likely worked the best for her and helped her the most.  She denies any fevers or chills or sick contacts.  This current episode that she is having started about a week and 1/2 to 2 weeks ago. ? ?Relevant past medical, surgical, family and social history reviewed and updated as indicated. Interim medical history since our last visit reviewed. ?Allergies and medications reviewed and updated. ? ?Review of Systems  ?Constitutional:  Positive for chills. Negative for fever.  ?HENT:  Positive for congestion. Negative for ear discharge, ear pain, postnasal drip, rhinorrhea, sinus pressure, sneezing and sore throat.   ?Eyes:  Negative for pain, redness and visual disturbance.  ?Respiratory:  Positive for cough, chest tightness, shortness of breath and wheezing.   ?Cardiovascular:  Negative for chest pain and leg swelling.  ?Genitourinary:  Negative for difficulty urinating and dysuria.  ?Musculoskeletal:  Negative for back pain, gait problem and myalgias.  ?Skin:  Negative for rash.  ?Neurological:  Negative for  light-headedness and headaches.  ?Psychiatric/Behavioral:  Negative for agitation and behavioral problems.   ?All other systems reviewed and are negative. ? ?Per HPI unless specifically indicated above ? ? ?Allergies as of 10/08/2021   ? ?   Reactions  ? Statins   ? Simvastatin, crestor, livalo ?Not able to walk because so much muscle/joint pain  ? Neosporin [neomycin-bacitracin Zn-polymyx] Rash  ? Polysporin [bacitracin-polymyxin B] Rash  ? ?  ? ?  ?Medication List  ?  ? ?  ? Accurate as of October 08, 2021  2:34 PM. If you have any questions, ask your nurse or doctor.  ?  ?  ? ?  ? ?STOP taking these medications   ? ?amoxicillin-clavulanate 875-125 MG tablet ?Commonly known as: AUGMENTIN ?Stopped by: Worthy Rancher, MD ?  ?celecoxib 200 MG capsule ?Commonly known as: CELEBREX ?Stopped by: Worthy Rancher, MD ?  ?tiZANidine 4 MG tablet ?Commonly known as: Zanaflex ?Stopped by: Worthy Rancher, MD ?  ? ?  ? ?TAKE these medications   ? ?Albuterol Sulfate 108 (90 Base) MCG/ACT Aepb ?Commonly known as: PROAIR RESPICLICK ?Inhale 1-2 puffs into the lungs every 6 (six) hours as needed (shortness of breath, wheezing). ?  ?Breztri Aerosphere 160-9-4.8 MCG/ACT Aero ?Generic drug: Budeson-Glycopyrrol-Formoterol ?Inhale 2 puffs into the lungs 2 (two) times daily. ?Started by: Worthy Rancher, MD ?  ?CALCIUM GUMMIES PO ?Take 2 tablets by mouth daily. ?  ?  clobetasol cream 0.05 % ?Commonly known as: TEMOVATE ?Apply 1 application topically 2 (two) times daily as needed (vaginal irritation.). ?  ?D3-50 1.25 MG (50000 UT) capsule ?Generic drug: Cholecalciferol ?Take 50,000 Units by mouth every Sunday. ?  ?lisinopril 10 MG tablet ?Commonly known as: ZESTRIL ?TAKE 1 TABLET BY MOUTH EVERY DAY ?  ?predniSONE 20 MG tablet ?Commonly known as: DELTASONE ?2 po at same time daily for 5 days ?What changed: additional instructions ?Changed by: Worthy Rancher, MD ?  ? ?  ? ? ? ?Objective:  ? ?BP 128/78   Pulse (!) 104   Temp (!)  97.2 ?F (36.2 ?C)   Ht '5\' 8"'$  (1.727 m)   Wt 203 lb (92.1 kg)   SpO2 94%   BMI 30.87 kg/m?   ?Wt Readings from Last 3 Encounters:  ?10/08/21 203 lb (92.1 kg)  ?08/06/21 206 lb (93.4 kg)  ?07/29/21 207 lb (93.9 kg)  ?  ?Physical Exam ?Vitals reviewed.  ?Constitutional:   ?   General: She is not in acute distress. ?   Appearance: She is well-developed. She is not diaphoretic.  ?HENT:  ?   Nose: Mucosal edema present. No rhinorrhea.  ?   Right Sinus: No maxillary sinus tenderness or frontal sinus tenderness.  ?   Left Sinus: No maxillary sinus tenderness or frontal sinus tenderness.  ?   Mouth/Throat:  ?   Pharynx: Uvula midline. No oropharyngeal exudate or posterior oropharyngeal erythema.  ?   Tonsils: No tonsillar abscesses.  ?Eyes:  ?   Conjunctiva/sclera: Conjunctivae normal.  ?Cardiovascular:  ?   Rate and Rhythm: Normal rate and regular rhythm.  ?   Heart sounds: Normal heart sounds. No murmur heard. ?Pulmonary:  ?   Effort: Pulmonary effort is normal. No respiratory distress.  ?   Breath sounds: Wheezing and rhonchi present.  ?Musculoskeletal:     ?   General: No swelling or tenderness. Normal range of motion.  ?Skin: ?   General: Skin is warm and dry.  ?   Findings: No rash.  ?Neurological:  ?   Mental Status: She is alert and oriented to person, place, and time.  ?   Coordination: Coordination normal.  ?Psychiatric:     ?   Behavior: Behavior normal.  ? ? ? ? ?Assessment & Plan:  ? ?Problem List Items Addressed This Visit   ?None ?Visit Diagnoses   ? ? COPD exacerbation ([place])    -  Primary  ? Relevant Medications  ? Budeson-Glycopyrrol-Formoterol (BREZTRI AEROSPHERE) 160-9-4.8 MCG/ACT AERO  ? methylPREDNISolone acetate (DEPO-MEDROL) injection 80 mg (Start on 10/08/2021  2:45 PM)  ? predniSONE (DELTASONE) 20 MG tablet  ? Other Relevant Orders  ? DG Chest 2 View  ? Acute cough      ? Relevant Medications  ? Budeson-Glycopyrrol-Formoterol (BREZTRI AEROSPHERE) 160-9-4.8 MCG/ACT AERO  ? methylPREDNISolone  acetate (DEPO-MEDROL) injection 80 mg (Start on 10/08/2021  2:45 PM)  ? predniSONE (DELTASONE) 20 MG tablet  ? Other Relevant Orders  ? DG Chest 2 View  ? Nasal congestion      ? Relevant Medications  ? Budeson-Glycopyrrol-Formoterol (BREZTRI AEROSPHERE) 160-9-4.8 MCG/ACT AERO  ? methylPREDNISolone acetate (DEPO-MEDROL) injection 80 mg (Start on 10/08/2021  2:45 PM)  ? predniSONE (DELTASONE) 20 MG tablet  ? Other Relevant Orders  ? DG Chest 2 View  ? ?  ?  ?Gave samples for breztri and steroid injection, likely COPD exacerbation.  Will do chest x-ray on the way out. ? ?Chest  x-ray, await final read from radiology. ?Follow up plan: ?Return if symptoms worsen or fail to improve. ? ?Counseling provided for all of the vaccine components ?Orders Placed This Encounter  ?Procedures  ? DG Chest 2 View  ? ? ?Caryl Pina, MD ?[place] ?10/08/2021, 2:34 PM ? ? ? ? ?

## 2021-11-20 ENCOUNTER — Ambulatory Visit (INDEPENDENT_AMBULATORY_CARE_PROVIDER_SITE_OTHER): Payer: Medicare Other | Admitting: Family

## 2021-11-20 ENCOUNTER — Encounter: Payer: Self-pay | Admitting: Family

## 2021-11-20 VITALS — BP 133/78 | HR 76 | Ht 68.0 in | Wt 208.0 lb

## 2021-11-20 DIAGNOSIS — Z01818 Encounter for other preprocedural examination: Secondary | ICD-10-CM

## 2021-11-20 DIAGNOSIS — I10 Essential (primary) hypertension: Secondary | ICD-10-CM

## 2021-11-20 DIAGNOSIS — R7303 Prediabetes: Secondary | ICD-10-CM | POA: Diagnosis not present

## 2021-11-20 LAB — BAYER DCA HB A1C WAIVED: HB A1C (BAYER DCA - WAIVED): 5.9 % — ABNORMAL HIGH (ref 4.8–5.6)

## 2021-11-20 NOTE — Patient Instructions (Signed)
Preparing for Hip Replacement ?Preparing before hip replacement surgery can make recovery easier and more comfortable. The information below gives some tips and guidelines that will help to prepare for this surgery. ?Talk with your health care provider so you can learn what to expect before, during, and after surgery. Ask questions if you do not understand something. ?Tell a health care provider about: ?Any allergies you have. ?All medicines you are taking, including vitamins, herbs, eye drops, creams, and over-the-counter medicines. ?Any problems you or family members have had with anesthetic medicines. ?Any blood disorders you have. ?Any surgeries you have had. ?Any medical conditions you have. ?Whether you are pregnant or may be pregnant. ?What happens before the procedure? ?Health care provider visits ?You will need to have a physical exam before you are scheduled for surgery (preoperative exam) to make sure it is safe for you to have surgery. This may require you to have more tests. When you go to the preoperative exam, bring a list of all the medicines, vitamins, herbs, and supplements that you take. ?Have dental care and routine cleanings done before surgery. Germs from anywhere in your body, including your mouth, can travel to your new joint and infect it. Tell your dentist that you plan to have hip replacement surgery. ?Surgery costs ?To find out how much surgery will cost, call your insurance company as soon as you decide to have surgery. Ask questions such as: ?How much of the surgery and hospital stay will be covered? ?What will be covered for the following items? ?Medical equipment. ?Rehabilitation facilities. ?Home care. ?Medicines. ?Preparing your home ?Pick a recovery spot that is not your bed. During recovery, it is better that you sit more upright than you can in your bed. Here are some tips for choosing a recovery spot: ?Choose a chair with arms and a high, firm seat that will not allow you to  sink down into it. Chairs and sofas that are too soft can allow your hip to bend at an angle greater than 90 degrees. This could put you at risk for moving your new hip joint out of place (dislocating it). ?Place items that you often use on a small table next to your chair. These items may include the TV remote control, a mobile phone, a book, a laptop computer, and a water glass. ?You may be given a walker to use at home. Check if you will have enough room to use a walker. Move around your home with your hands out about 6 inches (15 cm) from your sides. You will have enough room if you do not hit anything with your hands as you do this. Walk from: ?Your recovery spot to your kitchen and bathroom. ?Your bed to the bathroom. ?Apply these general tips: ?Move the items you use most often to shelves and drawers at countertop height. Do this in your kitchen, bathroom, and bedroom. ?Prepare some meals to freeze and reheat later. ?Home safety ? ?  ? ?Remove all clutter and throw rugs from your floors. Doing this will help you avoid tripping. ?Consider getting safety equipment that will be helpful during your recovery, such as: ?Grab bars in the shower and near the toilet. ?A raised toilet seat. This will help you get on and off the toilet more easily. ?A tub or shower bench. ?Preparing your body ?If you smoke, quit as soon as you can before surgery. If there is time, it is best to quit several months before surgery. Tell your surgeon if  you use any products that contain nicotine or tobacco, such as cigarettes, e-cigarettes, and chewing tobacco. These can delay healing. If you need help quitting, ask your health care provider. ?Maintain a healthy diet. Do not change your diet before surgery unless your health care provider tells you to do that. ?Do not drink any alcohol for at least 48 hours before surgery. ?Talk to your health care provider about doing exercises before your surgery. ?Be sure to follow the exercise program  given by your health care provider. ?Doing these exercises in the weeks before your surgery may help reduce pain and improve function after surgery. ?Recovery planning ?In the first couple of weeks after surgery, it will be harder for you to do some of your regular activities. You may get tired easily, and you may have limited movement in your leg. To make sure you have all the help you need after your surgery: ?Plan to have a responsible adult take you home from the hospital. Your health care provider will tell you how many days you can expect to be in the hospital. ?Cancel all of your work, caregiving, and volunteer responsibilities for at least 4-6 weeks after surgery. ?Plan to have a responsible adult stay with you both day and night for the first week. This person should be someone you are comfortable with. You may need this person to help you with your exercises and personal care, such as bathing and using the toilet. ?If you live alone, arrange for someone to take care of your home and pets for the first 4-6 weeks after surgery. ?You may not be able to drive for 4-6 weeks. Plan to have someone drive you on errands and to appointments. ?Consider applying for a disability parking permit. To get an application, call your local Department of Motor Vehicles [facility]) or your health care provider's office. ?Summary ?Getting prepared before hip replacement surgery can make your recovery easier and more comfortable. ?Keep all of your preoperative appointments to make sure that you are ready for your surgery. ?Prepare your home and arrange for help at home. ?Plan to have a responsible adult take you home from the hospital and stay with you day and night for the first week. ?This information is not intended to replace advice given to you by your health care provider. Make sure you discuss any questions you have with your health care provider. ?Document Revised: 12/07/2019 Document Reviewed: 12/07/2019 ?Elsevier Patient  Education ? [place]. ? ?

## 2021-11-20 NOTE — Progress Notes (Signed)
? ?Subjective:  ? ? Patient ID: Mary Moss, female    DOB: [birth year], 86 y.o.   MRN: [id number] ? ?Chief Complaint  ?Patient presents with  ? Surgical Clearance  ?  Left hip total  ? ?PT presents to the office today for surgical clearance for left hip replacement. She has HTN, arthritis, morbid obese,  ?Hypertension ?This is a chronic problem. The current episode started more than 1 year ago. The problem has been waxing and waning since onset. The problem is uncontrolled. Associated symptoms include malaise/fatigue. Pertinent negatives include no peripheral edema or shortness of breath. Risk factors for coronary artery disease include dyslipidemia and obesity. The current treatment provides moderate improvement. There is no history of CVA or heart failure.  ?Arthritis ?Presents for follow-up visit. She complains of pain and stiffness. Affected location: left hipe. Her pain is at a severity of 5/10.  ?Diabetes ?She presents for her follow-up diabetic visit. Diabetes type: prediabetic. Pertinent negatives for diabetic complications include no CVA. Risk factors for coronary artery disease include diabetes mellitus, dyslipidemia, hypertension, sedentary lifestyle and post-menopausal. (Does not check at home)  ? ? ? ?Review of Systems  ?Constitutional:  Positive for malaise/fatigue.  ?Respiratory:  Negative for shortness of breath.   ?Musculoskeletal:  Positive for arthritis and stiffness.  ?All other systems reviewed and are negative. ? ?   ?Objective:  ? Physical Exam ?Vitals reviewed.  ?Constitutional:   ?   General: She is not in acute distress. ?   Appearance: She is well-developed. She is obese.  ?HENT:  ?   Head: Normocephalic and atraumatic.  ?Eyes:  ?   Pupils: Pupils are equal, round, and reactive to light.  ?Neck:  ?   Thyroid: No thyromegaly.  ?Cardiovascular:  ?   Rate and Rhythm: Normal rate and regular rhythm.  ?   Heart sounds: Normal heart sounds. No murmur heard. ?Pulmonary:  ?   Effort:  Pulmonary effort is normal. No respiratory distress.  ?   Breath sounds: Normal breath sounds. No wheezing.  ?Abdominal:  ?   General: Bowel sounds are normal. There is no distension.  ?   Palpations: Abdomen is soft.  ?   Tenderness: There is no abdominal tenderness.  ?Musculoskeletal:     ?   General: Tenderness present.  ?   Cervical back: Normal range of motion and neck supple.  ?   Comments: Pain in left hip with internal rotation  ?Skin: ?   General: Skin is warm and dry.  ?Neurological:  ?   Mental Status: She is alert and oriented to person, place, and time.  ?   Cranial Nerves: No cranial nerve deficit.  ?   Deep Tendon Reflexes: Reflexes are normal and symmetric.  ?Psychiatric:     ?   Behavior: Behavior normal.     ?   Thought Content: Thought content normal.     ?   Judgment: Judgment normal.  ? ? ? ? ?BP (!) 148/80   Pulse 76   Ht '5\' 8"'  (1.727 m)   Wt 208 lb (94.3 kg)   SpO2 94%   BMI 31.63 kg/m?  ? ?   ?Assessment & Plan:  ?Mary Moss comes in today with chief complaint of Surgical Clearance (Left hip total) ? ? ?Diagnosis and orders addressed: ? ?1. Preoperative clearance ?- EKG 12-Lead ?- CMP14+EGFR ?- CBC with Differential/Platelet ? ?2. Pre-diabetes ?- Bayer DCA Hb A1c Waived ?- CMP14+EGFR ?- CBC with Differential/Platelet ? ?3.  Essential hypertension ?- CMP14+EGFR ?- CBC with Differential/Platelet ? ?4. Morbid obesity ([place]) ?- CMP14+EGFR ?- CBC with Differential/Platelet ? ? ?Labs pending ?Health Maintenance reviewed ?Diet and exercise encouraged ? ?Follow up plan: ?Keep chronic follow up ? ? ?Evelina Dun, FNP ? ? ?

## 2021-11-21 LAB — CBC WITH DIFFERENTIAL/PLATELET
Basophils Absolute: 0.1 10*3/uL (ref 0.0–0.2)
Basos: 2 %
EOS (ABSOLUTE): 0.2 10*3/uL (ref 0.0–0.4)
Eos: 3 %
Hematocrit: 39.6 % (ref 34.0–46.6)
Hemoglobin: 13.2 g/dL (ref 11.1–15.9)
Immature Grans (Abs): 0 10*3/uL (ref 0.0–0.1)
Immature Granulocytes: 0 %
Lymphocytes Absolute: 2.1 10*3/uL (ref 0.7–3.1)
Lymphs: 32 %
MCH: 31.5 pg (ref 26.6–33.0)
MCHC: 33.3 g/dL (ref 31.5–35.7)
MCV: 95 fL (ref 79–97)
Monocytes Absolute: 0.5 10*3/uL (ref 0.1–0.9)
Monocytes: 7 %
Neutrophils Absolute: 3.7 10*3/uL (ref 1.4–7.0)
Neutrophils: 56 %
Platelets: 272 10*3/uL (ref 150–450)
RBC: 4.19 x10E6/uL (ref 3.77–5.28)
RDW: 12.1 % (ref 11.7–15.4)
WBC: 6.7 10*3/uL (ref 3.4–10.8)

## 2021-11-21 LAB — CMP14+EGFR
ALT: 15 IU/L (ref 0–32)
AST: 23 IU/L (ref 0–40)
Albumin/Globulin Ratio: 1.5 (ref 1.2–2.2)
Albumin: 3.8 g/dL (ref 3.6–4.6)
Alkaline Phosphatase: 83 IU/L (ref 44–121)
BUN/Creatinine Ratio: 18 (ref 12–28)
BUN: 16 mg/dL (ref 8–27)
Bilirubin Total: 0.5 mg/dL (ref 0.0–1.2)
CO2: 21 mmol/L (ref 20–29)
Calcium: 9.5 mg/dL (ref 8.7–10.3)
Chloride: 103 mmol/L (ref 96–106)
Creatinine, Ser: 0.88 mg/dL (ref 0.57–1.00)
Globulin, Total: 2.6 g/dL (ref 1.5–4.5)
Glucose: 87 mg/dL (ref 70–99)
Potassium: 4.2 mmol/L (ref 3.5–5.2)
Sodium: 143 mmol/L (ref 134–144)
Total Protein: 6.4 g/dL (ref 6.0–8.5)
eGFR: 65 mL/min/{1.73_m2} (ref >59)

## 2021-12-16 NOTE — Patient Instructions (Signed)
DUE TO COVID-19 ONLY TWO VISITORS  (aged 84 and older)  IS ALLOWED TO COME WITH YOU AND STAY IN THE WAITING ROOM ONLY DURING PRE OP AND PROCEDURE.   **NO VISITORS ARE ALLOWED IN THE SHORT STAY AREA OR RECOVERY ROOM!!**  IF YOU WILL BE ADMITTED INTO THE HOSPITAL YOU ARE ALLOWED ONLY FOUR SUPPORT PEOPLE DURING VISITATION HOURS ONLY (7 AM -8PM)   The support person(s) must pass our screening, gel in and out Visitors GUEST BADGE MUST BE WORN VISIBLY  One adult visitor may remain with you overnight and MUST be in the room by 8 P.M.   You are not required to [employer] often Do NOT share personal items Notify your provider if you are in close contact with someone who has COVID or you develop fever 100.4 or greater, new onset of sneezing, cough, sore throat, shortness of breath or body aches.        Your procedure is scheduled on:  12-31-21   Report to [facility] Main Entrance    Report to admitting at 7:45 AM   Call this number if you have problems the morning of surgery [phone]   Do not eat food :After Midnight.   After Midnight you may have the following liquids until 7:30 AM DAY OF SURGERY  Water Black Coffee (sugar ok, NO MILK/CREAM OR CREAMERS)  Tea (sugar ok, NO MILK/CREAM OR CREAMERS) regular and decaf                             Plain Jell-O (NO RED)                                           Fruit ices (not with fruit pulp, NO RED)                                     Popsicles (NO RED)                                                                  Juice: apple, WHITE grape, WHITE cranberry Sports drinks like Gatorade (NO RED) Clear broth(vegetable,chicken,beef)                   The day of surgery:  Drink ONE (1) Pre-Surgery G2 at 7:30 AM the morning of surgery. Drink in one sitting. Do not sip.  This drink was given to you during your hospital  pre-op appointment visit. Nothing else to drink after completing the Pre-Surgery G2.          If  you have questions, please contact your surgeon's office.   FOLLOW ANY ADDITIONAL PRE OP INSTRUCTIONS YOU RECEIVED FROM YOUR SURGEON'S OFFICE!!!     Oral Hygiene is also important to reduce your risk of infection.                                    Remember - BRUSH YOUR TEETH THE  MORNING OF SURGERY WITH YOUR REGULAR TOOTHPASTE   Do NOT smoke after Midnight   Take these medicines the morning of surgery with A SIP OF WATER:  Okay to use inhalers (bring with you day of surgery)  Bring CPAP mask and tubing day of surgery.                              You may not have any metal on your body including hair pins, jewelry, and body piercing             Do not wear make-up, lotions, powders, perfumes or deodorant  Do not wear nail polish including gel and S&S, artificial/acrylic nails, or any other type of covering on natural nails including finger and toenails. If you have artificial nails, gel coating, etc. that needs to be removed by a nail salon please have this removed prior to surgery or surgery may need to be canceled/ delayed if the surgeon/ anesthesia feels like they are unable to be safely monitored.   Do not shave  48 hours prior to surgery.    Do not bring valuables to the hospital. [place].   Contacts, dentures or bridgework may not be worn into surgery.   Bring small overnight bag day of surgery.   Special Instructions: Bring a copy of your healthcare power of attorney and living will documents the day of surgery if you haven't scanned them before.  Please read over the following fact sheets you were given: IF YOU HAVE QUESTIONS ABOUT YOUR PRE-OP INSTRUCTIONS PLEASE CALL [phone]  [facility] - Preparing for Surgery Before surgery, you can play an important role.  Because skin is not sterile, your skin needs to be as free of germs as possible.  You can reduce the number of germs on your skin by washing with CHG (chlorahexidine  gluconate) soap before surgery.  CHG is an antiseptic cleaner which kills germs and bonds with the skin to continue killing germs even after washing. Please DO NOT use if you have an allergy to CHG or antibacterial soaps.  If your skin becomes reddened/irritated stop using the CHG and inform your nurse when you arrive at Short Stay. Do not shave (including legs and underarms) for at least 48 hours prior to the first CHG shower.  You may shave your face/neck.  Please follow these instructions carefully:  1.  Shower with CHG Soap the night before surgery and the  morning of surgery.  2.  If you choose to wash your hair, wash your hair first as usual with your normal  shampoo.  3.  After you shampoo, rinse your hair and body thoroughly to remove the shampoo.                             4.  Use CHG as you would any other liquid soap.  You can apply chg directly to the skin and wash.  Gently with a scrungie or clean washcloth.  5.  Apply the CHG Soap to your body ONLY FROM THE NECK DOWN.   Do   not use on face/ open                           Wound or open sores. Avoid contact with eyes, ears mouth and   genitals (private  parts).                       Wash face,  Genitals (private parts) with your normal soap.             6.  Wash thoroughly, paying special attention to the area where your    surgery  will be performed.  7.  Thoroughly rinse your body with warm water from the neck down.  8.  DO NOT shower/wash with your normal soap after using and rinsing off the CHG Soap.                9.  Pat yourself dry with a clean towel.            10.  Wear clean pajamas.            11.  Place clean sheets on your bed the night of your first shower and do not  sleep with pets. Day of Surgery : Do not apply any lotions/deodorants the morning of surgery.  Please wear clean clothes to the hospital/surgery center.  FAILURE TO FOLLOW THESE INSTRUCTIONS MAY RESULT IN THE CANCELLATION OF YOUR SURGERY  PATIENT  SIGNATURE_________________________________  NURSE SIGNATURE__________________________________  ________________________________________________________________________     Adam Phenix  An incentive spirometer is a tool that can help keep your lungs clear and active. This tool measures how well you are filling your lungs with each breath. Taking long deep breaths may help reverse or decrease the chance of developing breathing (pulmonary) problems (especially infection) following: A long period of time when you are unable to move or be active. BEFORE THE PROCEDURE  If the spirometer includes an indicator to show your best effort, your nurse or respiratory therapist will set it to a desired goal. If possible, sit up straight or lean slightly forward. Try not to slouch. Hold the incentive spirometer in an upright position. INSTRUCTIONS FOR USE  Sit on the edge of your bed if possible, or sit up as far as you can in bed or on a chair. Hold the incentive spirometer in an upright position. Breathe out normally. Place the mouthpiece in your mouth and seal your lips tightly around it. Breathe in slowly and as deeply as possible, raising the piston or the ball toward the top of the column. Hold your breath for 3-5 seconds or for as long as possible. Allow the piston or ball to fall to the bottom of the column. Remove the mouthpiece from your mouth and breathe out normally. Rest for a few seconds and repeat Steps 1 through 7 at least 10 times every 1-2 hours when you are awake. Take your time and take a few normal breaths between deep breaths. The spirometer may include an indicator to show your best effort. Use the indicator as a goal to work toward during each repetition. After each set of 10 deep breaths, practice coughing to be sure your lungs are clear. If you have an incision (the cut made at the time of surgery), support your incision when coughing by placing a pillow or rolled up  towels firmly against it. Once you are able to get out of bed, walk around indoors and cough well. You may stop using the incentive spirometer when instructed by your caregiver.  RISKS AND COMPLICATIONS Take your time so you do not get dizzy or light-headed. If you are in pain, you may need to take or ask for pain medication before doing incentive spirometry.  It is harder to take a deep breath if you are having pain. AFTER USE Rest and breathe slowly and easily. It can be helpful to keep track of a log of your progress. Your caregiver can provide you with a simple table to help with this. If you are using the spirometer at home, follow these instructions: [place] IF:  You are having difficultly using the spirometer. You have trouble using the spirometer as often as instructed. Your pain medication is not giving enough relief while using the spirometer. You develop fever of 100.5 F (38.1 C) or higher. SEEK IMMEDIATE MEDICAL CARE IF:  You cough up bloody sputum that had not been present before. You develop fever of 102 F (38.9 C) or greater. You develop worsening pain at or near the incision site. MAKE SURE YOU:  Understand these instructions. Will watch your condition. Will get help right away if you are not doing well or get worse. Document Released: 11/23/2006 Document Revised: 10/05/2011 Document Reviewed: 01/24/2007 Exit[employer] Patient Information 2014 [employer], [place].   ________________________________________________________________________  WHAT IS A BLOOD TRANSFUSION? Blood Transfusion Information  A transfusion is the replacement of blood or some of its parts. Blood is made up of multiple cells which provide different functions. Red blood cells carry oxygen and are used for blood loss replacement. White blood cells fight against infection. Platelets control bleeding. Plasma helps clot blood. Other blood products are available for specialized needs, such as  hemophilia or other clotting disorders. BEFORE THE TRANSFUSION  Who gives blood for transfusions?  Healthy volunteers who are fully evaluated to make sure their blood is safe. This is blood bank blood. Transfusion therapy is the safest it has ever been in the practice of medicine. Before blood is taken from a donor, a complete history is taken to make sure that person has no history of diseases nor engages in risky social behavior (examples are intravenous drug use or sexual activity with multiple partners). The donor's travel history is screened to minimize risk of transmitting infections, such as malaria. The donated blood is tested for signs of infectious diseases, such as HIV and hepatitis. The blood is then tested to be sure it is compatible with you in order to minimize the chance of a transfusion reaction. If you or a relative donates blood, this is often done in anticipation of surgery and is not appropriate for emergency situations. It takes many days to process the donated blood. RISKS AND COMPLICATIONS Although transfusion therapy is very safe and saves many lives, the main dangers of transfusion include:  Getting an infectious disease. Developing a transfusion reaction. This is an allergic reaction to something in the blood you were given. Every precaution is taken to prevent this. The decision to have a blood transfusion has been considered carefully by your caregiver before blood is given. Blood is not given unless the benefits outweigh the risks. AFTER THE TRANSFUSION Right after receiving a blood transfusion, you will usually feel much better and more energetic. This is especially true if your red blood cells have gotten low (anemic). The transfusion raises the level of the red blood cells which carry oxygen, and this usually causes an energy increase. The nurse administering the transfusion will monitor you carefully for complications. HOME CARE INSTRUCTIONS  No special instructions  are needed after a transfusion. You may find your energy is better. Speak with your caregiver about any limitations on activity for underlying diseases you may have. SEEK MEDICAL CARE IF:  Your  condition is not improving after your transfusion. You develop redness or irritation at the intravenous (IV) site. SEEK IMMEDIATE MEDICAL CARE IF:  Any of the following symptoms occur over the next 12 hours: Shaking chills. You have a temperature by mouth above 102 F (38.9 C), not controlled by medicine. Chest, back, or muscle pain. People around you feel you are not acting correctly or are confused. Shortness of breath or difficulty breathing. Dizziness and fainting. You get a rash or develop hives. You have a decrease in urine output. Your urine turns a dark color or changes to pink, red, or brown. Any of the following symptoms occur over the next 10 days: You have a temperature by mouth above 102 F (38.9 C), not controlled by medicine. Shortness of breath. Weakness after normal activity. The white part of the eye turns yellow (jaundice). You have a decrease in the amount of urine or are urinating less often. Your urine turns a dark color or changes to pink, red, or brown. Document Released: 07/10/2000 Document Revised: 10/05/2011 Document Reviewed: 02/27/2008 [facility] Patient Information 2014 [place], [place].  _______________________________________________________________________

## 2021-12-16 NOTE — H&P (Signed)
TOTAL HIP ADMISSION H&P  Patient is admitted for left total hip arthroplasty.  Subjective:  Chief Complaint: Left hip pain  HPI: Mary Moss, 86 y.o. female, has a history of pain and functional disability in the left hip due to arthritis and patient has failed non-surgical conservative treatments for greater than 12 weeks to include NSAID's and/or analgesics, corticosteriod injections, and activity modification. Onset of symptoms was gradual, starting  several  years ago with gradually worsening course since that time. The patient noted no past surgery on the left hip. Patient currently rates pain in the left hip at 8 out of 10 with activity. Patient has night pain, worsening of pain with activity and weight bearing, pain that interfers with activities of daily living, and pain with passive range of motion. Patient has evidence of  moderate to advanced arthritic changes in the left hip, which is normal appearing  by imaging studies. This condition presents safety issues increasing the risk of falls. There is no current active infection.  Patient Active Problem List   Diagnosis Date Noted   Status post reverse arthroplasty of right shoulder 11/23/2019   Morbid obesity ([place]) 11/18/2018   OA (osteoarthritis) of knee 06/08/2016   Arthritis of knee, right 01/31/2016   Hyperlipidemia 09/02/2015   Pre-diabetes 07/12/2015   Essential hypertension 07/03/2015   Post-traumatic arthritis of ankle 12/20/2014   Acute medial meniscal tear 02/06/2014   Low bone mass 12/14/2012   GAD (generalized anxiety disorder) 12/14/2012   Vitamin D deficiency 12/14/2012    Past Medical History:  Diagnosis Date   Ankle fracture fx 13 yrs ago, anklw swells off and on, has cramps in ankle also   left, to seee dr hewitt about 02-16-14   Arthritis    Basal cell carcinoma 2015   generalized   GERD (gastroesophageal reflux disease)    OTC med   pt. denies   Hypertension    Pneumonia     Past Surgical  History:  Procedure Laterality Date   ABDOMINAL HYSTERECTOMY     EYE SURGERY Bilateral    bilaterla cataract extraction with IOL   FRACTURE SURGERY Left 2001   JOINT REPLACEMENT     KNEE ARTHROSCOPY Right 02/07/2014   Procedure: RIGHT ARTHROSCOPY KNEE, WITH MEDIAL MENISCAL DEBRIDEMENT AND CHONDRAPLASTY;  Surgeon: Gearlean Alf, MD;  Location: [facility];  Service: Orthopedics;  Laterality: Right;   ROTATOR CUFF REPAIR Right 2009   TOTAL ANKLE ARTHROPLASTY Left 12/20/2014   Procedure: LEFT TOTAL ANKLE ARTHOPLASTY WITH PERCUTANEOUS ACHILLES TENDON LENGTHENING;  Surgeon: Wylene Simmer, MD;  Location: [place];  Service: Orthopedics;  Laterality: Left;   TOTAL KNEE ARTHROPLASTY Right 06/08/2016   Procedure: RIGHT TOTAL KNEE ARTHROPLASTY;  Surgeon: Gaynelle Arabian, MD;  Location: [facility];  Service: Orthopedics;  Laterality: Right;   TOTAL SHOULDER ARTHROPLASTY Right 11/23/2019   Procedure: REVERSE TOTAL SHOULDER ARTHROPLASTY;  Surgeon: Tania Ade, MD;  Location: [facility];  Service: Orthopedics;  Laterality: Right;    Prior to Admission medications   Medication Sig Start Date End Date Taking? Authorizing Provider  Albuterol Sulfate (PROAIR RESPICLICK) 329 (90 Base) MCG/ACT AEPB Inhale 1-2 puffs into the lungs every 6 (six) hours as needed (shortness of breath, wheezing). 02/03/21   Gwenlyn Perking, FNP  Budeson-Glycopyrrol-Formoterol (BREZTRI AEROSPHERE) 160-9-4.8 MCG/ACT AERO Inhale 2 puffs into the lungs 2 (two) times daily. 10/08/21   Dettinger, Fransisca Kaufmann, MD  Calcium-Phosphorus-Vitamin D (CALCIUM GUMMIES PO) Take 2 tablets by mouth daily.    [provider]  clobetasol cream (TEMOVATE) 7.35 % Apply 1 application topically 2 (two) times daily as needed (vaginal irritation.). 03/30/19   [provider]  D3-50 1.25 MG (50000 UT) capsule Take 50,000 Units by mouth every Sunday. 10/23/19   [provider]  hydrochlorothiazide (HYDRODIURIL) 25 MG tablet Take 25 mg by mouth daily.  11/14/21   [provider]  lisinopril (ZESTRIL) 10 MG tablet TAKE 1 TABLET BY MOUTH EVERY DAY 04/01/21   Evelina Dun A, FNP    Allergies  Allergen Reactions   Statins     Simvastatin, crestor, livalo Not able to walk because so much muscle/joint pain   Neosporin [Neomycin-Bacitracin Zn-Polymyx] Rash   Polysporin [Bacitracin-Polymyxin B] Rash    Social History   Socioeconomic History   Marital status: Widowed    Spouse name: Not on file   Number of children: 0   Years of education: 38   Highest education level: Some college, no degree  Occupational History   Occupation: Management consultant    Comment: retired   Occupation: Warden/ranger    Comment: Retired  Tobacco Use   Smoking status: Former    Packs/day: 0.50    Years: 40.00    Pack years: 20.00    Types: Cigarettes    Start date: 07/27/1957    Quit date: 12/15/2007    Years since quitting: 14.0   Smokeless tobacco: Never  Vaping Use   Vaping Use: Never used  Substance and Sexual Activity   Alcohol use: Not Currently    Comment: rare   Drug use: No   Sexual activity: Not Currently    Birth control/protection: Surgical  Other Topics Concern   Not on file  Social History Narrative   Not on file   Social Determinants of Health   Financial Resource Strain: Not on file  Food Insecurity: Not on file  Transportation Needs: Not on file  Physical Activity: Not on file  Stress: Not on file  Social Connections: Not on file  Intimate Partner Violence: Not on file    Tobacco Use: Medium Risk   Smoking Tobacco Use: Former   Smokeless Tobacco Use: Never   Passive Exposure: Not on file   Social History   Substance and Sexual Activity  Alcohol Use Not Currently   Comment: rare    Family History  Problem Relation Age of Onset   Cancer Mother        colon   Cancer Father        lung   Cancer Sister        lung cancer-nonsmoker   Alzheimer's disease Brother 79    Review of Systems   Constitutional:  Negative for chills and fever.  HENT: Negative.    Eyes: Negative.   Respiratory:  Negative for cough and shortness of breath.   Cardiovascular:  Negative for chest pain and palpitations.  Gastrointestinal:  Negative for abdominal pain, constipation, diarrhea, nausea and vomiting.  Genitourinary:  Negative for dysuria, frequency and urgency.  Musculoskeletal:  Positive for joint pain.  Skin:  Negative for rash.    Objective:  Physical Exam: Well nourished and well developed.  General: Alert and oriented x3, cooperative and pleasant, no acute distress.  Head: normocephalic, atraumatic, neck supple.  Eyes: EOMI.  Abdomen: non-tender to palpation and soft, normoactive bowel sounds. Musculoskeletal: The patient has a significantly antalgic gait pattern favoring the left side with a cane.   Right Hip Exam:  The range of motion: normal without discomfort.  There  is no tenderness over the greater trochanter bursa.   Left Hip Exam:  The range of motion: Flexion to 100 degrees, Internal Rotation is minimal with pain, External Rotation to 20 degrees with pain, and abduction to 20 degrees without discomfort.  There is no tenderness over the greater trochanteric bursa.  Calves soft and nontender. Motor function intact in LE. Strength 5/5 LE bilaterally. Neuro: Distal pulses 2+. Sensation to light touch intact in LE.  Vital signs in last 24 hours: BP: ()/()  Arterial Line BP: ()/()   Imaging Review Plain radiographs demonstrate moderate degenerative joint disease of the left hip. The bone quality appears to be adequate for age and reported activity level.  Assessment/Plan:  End stage arthritis, left hip  The patient history, physical examination, clinical judgement of the provider and imaging studies are consistent with end stage degenerative joint disease of the left hip and total hip arthroplasty is deemed medically necessary. The treatment options including  medical management, injection therapy, arthroscopy and arthroplasty were discussed at length. The risks and benefits of total hip arthroplasty were presented and reviewed. The risks due to aseptic loosening, infection, stiffness, dislocation/subluxation, thromboembolic complications and other imponderables were discussed. The patient acknowledged the explanation, agreed to proceed with the plan and consent was signed. Patient is being admitted for inpatient treatment for surgery, pain control, PT, OT, prophylactic antibiotics, VTE prophylaxis, progressive ambulation and ADLs and discharge planning.The patient is planning to be discharged  home.  Therapy Plans: HEP Disposition: Home with family member Planned DVT Prophylaxis: Xarelto (hx of skin cancer) DME Needed: None PCP: Evelina Dun, FNP (clearance received) TXA: IV Allergies: Polysporin (rash) Anesthesia Concerns: None BMI: 32.4 Last HgbA1c: n/a  Pharmacy: [facility], [place])  - Patient was instructed on what medications to stop prior to surgery. - Follow-up visit in 2 weeks with Dr. Wynelle Link - Begin physical therapy following surgery - Pre-operative lab work as pre-surgical testing - Prescriptions will be provided in hospital at time of discharge  R. Jaynie Bream, PA-C Orthopedic Surgery [facility] [place]

## 2021-12-17 ENCOUNTER — Encounter (HOSPITAL_COMMUNITY): Payer: Self-pay

## 2021-12-17 ENCOUNTER — Encounter (HOSPITAL_COMMUNITY)
Admission: RE | Admit: 2021-12-17 | Discharge: 2021-12-17 | Disposition: A | Discharge status: Home / Self Care | Payer: Medicare Other | Source: Ambulatory Visit | Attending: Orthopedic Surgery | Admitting: Orthopedic Surgery

## 2021-12-17 ENCOUNTER — Other Ambulatory Visit: Payer: Self-pay

## 2021-12-17 VITALS — BP 138/74 | HR 68 | Temp 97.7°F | Resp 16 | Ht 68.0 in | Wt 210.0 lb

## 2021-12-17 DIAGNOSIS — Z01812 Encounter for preprocedural laboratory examination: Secondary | ICD-10-CM | POA: Insufficient documentation

## 2021-12-17 DIAGNOSIS — I2510 Atherosclerotic heart disease of native coronary artery without angina pectoris: Secondary | ICD-10-CM | POA: Insufficient documentation

## 2021-12-17 DIAGNOSIS — Z01818 Encounter for other preprocedural examination: Secondary | ICD-10-CM

## 2021-12-17 HISTORY — DX: Prediabetes: R73.03

## 2021-12-17 LAB — BASIC METABOLIC PANEL
Anion gap: 8 (ref 5–15)
BUN: 18 mg/dL (ref 8–23)
CO2: 30 mmol/L (ref 22–32)
Calcium: 9.6 mg/dL (ref 8.9–10.3)
Chloride: 105 mmol/L (ref 98–111)
Creatinine, Ser: 1.02 mg/dL — ABNORMAL HIGH (ref 0.44–1.00)
GFR, Estimated: 55 mL/min — ABNORMAL LOW (ref >60)
Glucose, Bld: 93 mg/dL (ref 70–99)
Potassium: 4.1 mmol/L (ref 3.5–5.1)
Sodium: 143 mmol/L (ref 135–145)

## 2021-12-17 LAB — SURGICAL PCR SCREEN
MRSA, PCR: NEGATIVE
Staphylococcus aureus: NEGATIVE

## 2021-12-17 LAB — CBC
HCT: 42.7 % (ref 36.0–46.0)
Hemoglobin: 13.8 g/dL (ref 12.0–15.0)
MCH: 31.9 pg (ref 26.0–34.0)
MCHC: 32.3 g/dL (ref 30.0–36.0)
MCV: 98.6 fL (ref 80.0–100.0)
Platelets: 297 10*3/uL (ref 150–400)
RBC: 4.33 MIL/uL (ref 3.87–5.11)
RDW: 12.2 % (ref 11.5–15.5)
WBC: 6.1 10*3/uL (ref 4.0–10.5)
nRBC: 0 % (ref 0.0–0.2)

## 2021-12-17 LAB — GLUCOSE, CAPILLARY: Glucose-Capillary: 89 mg/dL (ref 70–99)

## 2021-12-17 NOTE — Progress Notes (Addendum)
PCP - Regino Schultze, NP  [facility] clinic in [place] Cardiologist - no  PPM/ICD -  Device Orders -  Rep Notified -   Chest x-ray - 10-08-21 epic EKG - 11-20-21 epic Stress Test -  ECHO -  Cardiac Cath -  HgbA1c- 11-20-21 epic   Sleep Study -  CPAP -   Fasting Blood Sugar -  Checks Blood Sugar _____ times a day  Blood Thinner Instructions: Aspirin Instructions:  ERAS Protcol - PRE-SURGERY G2-    COVID vaccine -x2 moderna + one booster  Activity--No SOB or CP with Activities of daily living Anesthesia review: HTN pre DM,  Patient denies shortness of breath, fever, cough and chest pain at PAT appointment   All instructions explained to the patient, with a verbal understanding of the material. Patient agrees to go over the instructions while at home for a better understanding. Patient also instructed to self quarantine after being tested for COVID-19. The opportunity to ask questions was provided.

## 2021-12-30 NOTE — Anesthesia Preprocedure Evaluation (Signed)
Anesthesia Evaluation  Patient identified by MRN, date of birth, ID band Patient awake    Reviewed: Allergy & Precautions, NPO status , Patient's Chart, lab work & pertinent test results  Airway Mallampati: II  TM Distance: >3 FB Neck ROM: Full    Dental no notable dental hx. (+) Caps, Teeth Intact, Dental Advisory Given   Pulmonary former smoker,    Pulmonary exam normal breath sounds clear to auscultation       Cardiovascular hypertension, Normal cardiovascular exam Rhythm:Regular Rate:Normal     Neuro/Psych Anxiety    GI/Hepatic Neg liver ROS, GERD  Medicated and Controlled,  Endo/Other  negative endocrine ROS  Renal/GU Lab Results      Component                Value               Date                      CREATININE               1.02 (H)            12/17/2021              K                        4.1                 12/17/2021                     Musculoskeletal  (+) Arthritis , Osteoarthritis,    Abdominal   Peds  Hematology Lab Results      Component                Value               Date                      WBC                      6.1                 12/17/2021                HGB                      13.8                12/17/2021                HCT                      42.7                12/17/2021                MCV                      98.6                12/17/2021                PLT                      297  12/17/2021              Anesthesia Other Findings All: Statins Neosporin  Reproductive/Obstetrics                            Anesthesia Physical Anesthesia Plan  ASA: 3  Anesthesia Plan: Spinal   Post-op Pain Management: Minimal or no pain anticipated   Induction:   PONV Risk Score and Plan: 3 and Treatment may vary due to age or medical condition and Ondansetron  Airway Management Planned: Nasal Cannula and Natural Airway  Additional  Equipment: None  Intra-op Plan:   Post-operative Plan:   Informed Consent: I have reviewed the patients History and Physical, chart, labs and discussed the procedure including the risks, benefits and alternatives for the proposed anesthesia with the patient or authorized representative who has indicated his/her understanding and acceptance.     Dental advisory given  Plan Discussed with:   Anesthesia Plan Comments:        Anesthesia Quick Evaluation

## 2021-12-31 ENCOUNTER — Ambulatory Visit (HOSPITAL_COMMUNITY): Payer: Medicare Other

## 2021-12-31 ENCOUNTER — Observation Stay (HOSPITAL_COMMUNITY)
Admission: RE | Admit: 2021-12-31 | Discharge: 2022-01-01 | Disposition: A | Discharge status: Home / Self Care | Payer: Medicare Other | Source: Ambulatory Visit | Attending: Orthopedic Surgery | Admitting: Orthopedic Surgery

## 2021-12-31 ENCOUNTER — Encounter (HOSPITAL_COMMUNITY)
Admission: RE | Disposition: A | Discharge status: Home / Self Care | Payer: Self-pay | Source: Ambulatory Visit | Attending: Orthopedic Surgery

## 2021-12-31 ENCOUNTER — Ambulatory Visit (HOSPITAL_BASED_OUTPATIENT_CLINIC_OR_DEPARTMENT_OTHER): Payer: Medicare Other | Admitting: Certified Registered Nurse Anesthetist

## 2021-12-31 ENCOUNTER — Other Ambulatory Visit: Payer: Self-pay

## 2021-12-31 ENCOUNTER — Ambulatory Visit (HOSPITAL_COMMUNITY): Payer: Medicare Other | Admitting: Certified Registered Nurse Anesthetist

## 2021-12-31 ENCOUNTER — Encounter (HOSPITAL_COMMUNITY): Payer: Self-pay | Admitting: Orthopedic Surgery

## 2021-12-31 ENCOUNTER — Observation Stay (HOSPITAL_COMMUNITY): Payer: Medicare Other

## 2021-12-31 DIAGNOSIS — R7303 Prediabetes: Secondary | ICD-10-CM | POA: Diagnosis not present

## 2021-12-31 DIAGNOSIS — F419 Anxiety disorder, unspecified: Secondary | ICD-10-CM

## 2021-12-31 DIAGNOSIS — I2510 Atherosclerotic heart disease of native coronary artery without angina pectoris: Secondary | ICD-10-CM

## 2021-12-31 DIAGNOSIS — Z85828 Personal history of other malignant neoplasm of skin: Secondary | ICD-10-CM | POA: Diagnosis not present

## 2021-12-31 DIAGNOSIS — Z96651 Presence of right artificial knee joint: Secondary | ICD-10-CM | POA: Insufficient documentation

## 2021-12-31 DIAGNOSIS — I10 Essential (primary) hypertension: Secondary | ICD-10-CM | POA: Insufficient documentation

## 2021-12-31 DIAGNOSIS — Z01818 Encounter for other preprocedural examination: Secondary | ICD-10-CM

## 2021-12-31 DIAGNOSIS — Z87891 Personal history of nicotine dependence: Secondary | ICD-10-CM | POA: Insufficient documentation

## 2021-12-31 DIAGNOSIS — M1612 Unilateral primary osteoarthritis, left hip: Secondary | ICD-10-CM

## 2021-12-31 DIAGNOSIS — Z96611 Presence of right artificial shoulder joint: Secondary | ICD-10-CM | POA: Diagnosis not present

## 2021-12-31 DIAGNOSIS — Z96662 Presence of left artificial ankle joint: Secondary | ICD-10-CM | POA: Insufficient documentation

## 2021-12-31 DIAGNOSIS — Z79899 Other long term (current) drug therapy: Secondary | ICD-10-CM | POA: Insufficient documentation

## 2021-12-31 HISTORY — PX: TOTAL HIP ARTHROPLASTY: SHX124

## 2021-12-31 HISTORY — DX: Failed or difficult intubation, initial encounter: T88.4XXA

## 2021-12-31 LAB — GLUCOSE, CAPILLARY: Glucose-Capillary: 107 mg/dL — ABNORMAL HIGH (ref 70–99)

## 2021-12-31 LAB — TYPE AND SCREEN
ABO/RH(D): A POS
Antibody Screen: NEGATIVE

## 2021-12-31 SURGERY — ARTHROPLASTY, HIP, TOTAL, ANTERIOR APPROACH
Anesthesia: Spinal | Site: Hip | Laterality: Left

## 2021-12-31 MED ORDER — ONDANSETRON HCL 4 MG/2ML IJ SOLN: 4.0000 mg | Freq: Four times a day (QID) | INTRAMUSCULAR | Status: DC | PRN

## 2021-12-31 MED ORDER — METHOCARBAMOL 500 MG PO TABS
500.0000 mg | ORAL_TABLET | Freq: Four times a day (QID) | ORAL | Status: DC | PRN
  Administered 2021-12-31: 500 mg via ORAL
  Filled 2021-12-31: qty 1

## 2021-12-31 MED ORDER — DEXAMETHASONE SODIUM PHOSPHATE 10 MG/ML IJ SOLN
INTRAMUSCULAR | Status: AC
  Filled 2021-12-31: qty 1

## 2021-12-31 MED ORDER — PROPOFOL 500 MG/50ML IV EMUL
INTRAVENOUS | Status: DC | PRN
  Administered 2021-12-31: 50 ug/kg/min via INTRAVENOUS

## 2021-12-31 MED ORDER — CEFAZOLIN SODIUM-DEXTROSE 2-4 GM/100ML-% IV SOLN
2.0000 g | INTRAVENOUS | Status: AC
  Administered 2021-12-31: 2 g via INTRAVENOUS
  Filled 2021-12-31: qty 100

## 2021-12-31 MED ORDER — PROPOFOL 1000 MG/100ML IV EMUL
INTRAVENOUS | Status: AC
  Filled 2021-12-31: qty 100

## 2021-12-31 MED ORDER — ACETAMINOPHEN 10 MG/ML IV SOLN: 1000.0000 mg | Freq: Once | INTRAVENOUS | Status: DC | PRN

## 2021-12-31 MED ORDER — ONDANSETRON HCL 4 MG/2ML IJ SOLN
INTRAMUSCULAR | Status: DC | PRN
  Administered 2021-12-31: 4 mg via INTRAVENOUS

## 2021-12-31 MED ORDER — POVIDONE-IODINE 10 % EX SWAB
2.0000 "application " | Freq: Once | CUTANEOUS | Status: AC
  Administered 2021-12-31: 2 via TOPICAL

## 2021-12-31 MED ORDER — PHENYLEPHRINE HCL-NACL 20-0.9 MG/250ML-% IV SOLN
INTRAVENOUS | Status: DC | PRN
  Administered 2021-12-31: 50 ug/min via INTRAVENOUS

## 2021-12-31 MED ORDER — TRAMADOL HCL 50 MG PO TABS
50.0000 mg | ORAL_TABLET | Freq: Four times a day (QID) | ORAL | Status: DC | PRN
  Administered 2021-12-31: 50 mg via ORAL

## 2021-12-31 MED ORDER — ACETAMINOPHEN 325 MG PO TABS: 325.0000 mg | ORAL_TABLET | Freq: Four times a day (QID) | ORAL | Status: DC | PRN

## 2021-12-31 MED ORDER — CEFAZOLIN SODIUM-DEXTROSE 2-4 GM/100ML-% IV SOLN
2.0000 g | Freq: Four times a day (QID) | INTRAVENOUS | Status: AC
  Administered 2021-12-31 (×2): 2 g via INTRAVENOUS
  Filled 2021-12-31 (×2): qty 100

## 2021-12-31 MED ORDER — BUPIVACAINE-EPINEPHRINE (PF) 0.25% -1:200000 IJ SOLN
INTRAMUSCULAR | Status: AC
  Filled 2021-12-31: qty 30

## 2021-12-31 MED ORDER — HYDROCODONE-ACETAMINOPHEN 5-325 MG PO TABS
1.0000 | ORAL_TABLET | ORAL | Status: DC | PRN
  Administered 2021-12-31 – 2022-01-01 (×4): 2 via ORAL
  Filled 2021-12-31 (×4): qty 2

## 2021-12-31 MED ORDER — ONDANSETRON HCL 4 MG PO TABS: 4.0000 mg | ORAL_TABLET | Freq: Four times a day (QID) | ORAL | Status: DC | PRN

## 2021-12-31 MED ORDER — HYDROCHLOROTHIAZIDE 25 MG PO TABS
25.0000 mg | ORAL_TABLET | Freq: Every day | ORAL | Status: DC
  Administered 2022-01-01: 25 mg via ORAL
  Filled 2021-12-31: qty 1

## 2021-12-31 MED ORDER — FENTANYL CITRATE PF 50 MCG/ML IJ SOSY
25.0000 ug | PREFILLED_SYRINGE | INTRAMUSCULAR | Status: DC | PRN
  Administered 2021-12-31: 25 ug via INTRAVENOUS
  Administered 2021-12-31: 50 ug via INTRAVENOUS

## 2021-12-31 MED ORDER — WATER FOR IRRIGATION, STERILE IR SOLN
Status: DC | PRN
  Administered 2021-12-31: 2000 mL

## 2021-12-31 MED ORDER — POLYETHYLENE GLYCOL 3350 17 G PO PACK: 17.0000 g | PACK | Freq: Every day | ORAL | Status: DC | PRN

## 2021-12-31 MED ORDER — LACTATED RINGERS IV SOLN: INTRAVENOUS | Status: DC

## 2021-12-31 MED ORDER — ONDANSETRON HCL 4 MG/2ML IJ SOLN
INTRAMUSCULAR | Status: AC
  Filled 2021-12-31: qty 2

## 2021-12-31 MED ORDER — MORPHINE SULFATE (PF) 2 MG/ML IV SOLN: 0.5000 mg | INTRAVENOUS | Status: DC | PRN

## 2021-12-31 MED ORDER — DIPHENHYDRAMINE HCL 12.5 MG/5ML PO ELIX: 12.5000 mg | ORAL_SOLUTION | ORAL | Status: DC | PRN

## 2021-12-31 MED ORDER — METOCLOPRAMIDE HCL 5 MG/ML IJ SOLN: 5.0000 mg | Freq: Three times a day (TID) | INTRAMUSCULAR | Status: DC | PRN

## 2021-12-31 MED ORDER — PHENOL 1.4 % MT LIQD: 1.0000 | OROMUCOSAL | Status: DC | PRN

## 2021-12-31 MED ORDER — PROPOFOL 10 MG/ML IV BOLUS
INTRAVENOUS | Status: DC | PRN
  Administered 2021-12-31 (×4): 20 mg via INTRAVENOUS

## 2021-12-31 MED ORDER — FENTANYL CITRATE PF 50 MCG/ML IJ SOSY
PREFILLED_SYRINGE | INTRAMUSCULAR | Status: AC
  Filled 2021-12-31: qty 1

## 2021-12-31 MED ORDER — DEXAMETHASONE SODIUM PHOSPHATE 10 MG/ML IJ SOLN: 8.0000 mg | Freq: Once | INTRAMUSCULAR | Status: DC

## 2021-12-31 MED ORDER — FENTANYL CITRATE (PF) 100 MCG/2ML IJ SOLN
INTRAMUSCULAR | Status: DC | PRN
  Administered 2021-12-31: 25 ug via INTRAVENOUS

## 2021-12-31 MED ORDER — PROPOFOL 10 MG/ML IV BOLUS
INTRAVENOUS | Status: AC
  Filled 2021-12-31: qty 20

## 2021-12-31 MED ORDER — 0.9 % SODIUM CHLORIDE (POUR BTL) OPTIME
TOPICAL | Status: DC | PRN
  Administered 2021-12-31: 1000 mL

## 2021-12-31 MED ORDER — LIDOCAINE HCL (PF) 2 % IJ SOLN
INTRAMUSCULAR | Status: AC
  Filled 2021-12-31: qty 5

## 2021-12-31 MED ORDER — SODIUM CHLORIDE 0.9 % IV SOLN: INTRAVENOUS | Status: DC

## 2021-12-31 MED ORDER — FLEET ENEMA 7-19 GM/118ML RE ENEM: 1.0000 | ENEMA | Freq: Once | RECTAL | Status: DC | PRN

## 2021-12-31 MED ORDER — METHOCARBAMOL 500 MG IVPB - SIMPLE MED: 500.0000 mg | Freq: Four times a day (QID) | INTRAVENOUS | Status: DC | PRN

## 2021-12-31 MED ORDER — ONDANSETRON HCL 4 MG/2ML IJ SOLN: 4.0000 mg | Freq: Once | INTRAMUSCULAR | Status: DC | PRN

## 2021-12-31 MED ORDER — PHENYLEPHRINE HCL-NACL 20-0.9 MG/250ML-% IV SOLN
INTRAVENOUS | Status: AC
  Filled 2021-12-31: qty 250

## 2021-12-31 MED ORDER — DEXAMETHASONE SODIUM PHOSPHATE 10 MG/ML IJ SOLN
10.0000 mg | Freq: Once | INTRAMUSCULAR | Status: AC
  Administered 2022-01-01: 10 mg via INTRAVENOUS
  Filled 2021-12-31: qty 1

## 2021-12-31 MED ORDER — CHLORHEXIDINE GLUCONATE 0.12 % MT SOLN
15.0000 mL | Freq: Once | OROMUCOSAL | Status: AC
  Administered 2021-12-31: 15 mL via OROMUCOSAL

## 2021-12-31 MED ORDER — BISACODYL 10 MG RE SUPP: 10.0000 mg | Freq: Every day | RECTAL | Status: DC | PRN

## 2021-12-31 MED ORDER — TRANEXAMIC ACID-NACL 1000-0.7 MG/100ML-% IV SOLN
1000.0000 mg | INTRAVENOUS | Status: AC
  Administered 2021-12-31: 1000 mg via INTRAVENOUS
  Filled 2021-12-31: qty 100

## 2021-12-31 MED ORDER — DOCUSATE SODIUM 100 MG PO CAPS
100.0000 mg | ORAL_CAPSULE | Freq: Two times a day (BID) | ORAL | Status: DC
  Administered 2021-12-31 – 2022-01-01 (×3): 100 mg via ORAL
  Filled 2021-12-31 (×3): qty 1

## 2021-12-31 MED ORDER — TRAMADOL HCL 50 MG PO TABS
ORAL_TABLET | ORAL | Status: AC
  Filled 2021-12-31: qty 1

## 2021-12-31 MED ORDER — METOCLOPRAMIDE HCL 5 MG PO TABS: 5.0000 mg | ORAL_TABLET | Freq: Three times a day (TID) | ORAL | Status: DC | PRN

## 2021-12-31 MED ORDER — DEXAMETHASONE SODIUM PHOSPHATE 10 MG/ML IJ SOLN
INTRAMUSCULAR | Status: DC | PRN
  Administered 2021-12-31: 8 mg via INTRAVENOUS

## 2021-12-31 MED ORDER — BUPIVACAINE-EPINEPHRINE (PF) 0.25% -1:200000 IJ SOLN
INTRAMUSCULAR | Status: DC | PRN
  Administered 2021-12-31: 30 mL

## 2021-12-31 MED ORDER — MENTHOL 3 MG MT LOZG: 1.0000 | LOZENGE | OROMUCOSAL | Status: DC | PRN

## 2021-12-31 MED ORDER — HYDROXYCHLOROQUINE SULFATE 200 MG PO TABS
400.0000 mg | ORAL_TABLET | Freq: Every day | ORAL | Status: DC
  Administered 2022-01-01: 400 mg via ORAL
  Filled 2021-12-31: qty 2

## 2021-12-31 MED ORDER — ORAL CARE MOUTH RINSE: 15.0000 mL | Freq: Once | OROMUCOSAL | Status: AC

## 2021-12-31 MED ORDER — RIVAROXABAN 10 MG PO TABS
10.0000 mg | ORAL_TABLET | Freq: Every day | ORAL | Status: DC
  Administered 2022-01-01: 10 mg via ORAL
  Filled 2021-12-31: qty 1

## 2021-12-31 MED ORDER — BUPIVACAINE IN DEXTROSE 0.75-8.25 % IT SOLN
INTRATHECAL | Status: DC | PRN
  Administered 2021-12-31: 12 mg via INTRATHECAL

## 2021-12-31 MED ORDER — ACETAMINOPHEN 10 MG/ML IV SOLN
1000.0000 mg | Freq: Four times a day (QID) | INTRAVENOUS | Status: DC
  Administered 2021-12-31: 1000 mg via INTRAVENOUS
  Filled 2021-12-31: qty 100

## 2021-12-31 MED ORDER — FENTANYL CITRATE (PF) 100 MCG/2ML IJ SOLN
INTRAMUSCULAR | Status: AC
  Filled 2021-12-31: qty 2

## 2021-12-31 SURGICAL SUPPLY — 49 items
BAG COUNTER SPONGE SURGICOUNT (BAG) ×1 IMPLANT
BAG DECANTER FOR FLEXI CONT (MISCELLANEOUS) IMPLANT
BAG SPEC THK2 15X12 ZIP CLS (MISCELLANEOUS) ×1
BAG SPNG CNTER NS LX DISP (BAG) ×1
BAG ZIPLOCK 12X15 (MISCELLANEOUS) ×1 IMPLANT
BLADE SAG 18X100X1.27 (BLADE) ×2 IMPLANT
CLSR STERI-STRIP ANTIMIC 1/2X4 (GAUZE/BANDAGES/DRESSINGS) ×1 IMPLANT
COVER PERINEAL POST (MISCELLANEOUS) ×2 IMPLANT
COVER SURGICAL LIGHT HANDLE (MISCELLANEOUS) ×2 IMPLANT
CUP ACET PINNACLE SECTR 50MM (Hips) IMPLANT
DRAPE FOOT SWITCH (DRAPES) ×2 IMPLANT
DRAPE STERI IOBAN 125X83 (DRAPES) ×2 IMPLANT
DRAPE U-SHAPE 47X51 STRL (DRAPES) ×4 IMPLANT
DRSG AQUACEL AG ADV 3.5X10 (GAUZE/BANDAGES/DRESSINGS) ×2 IMPLANT
DURAPREP 26ML APPLICATOR (WOUND CARE) ×2 IMPLANT
ELECT REM PT RETURN 15FT ADLT (MISCELLANEOUS) ×2 IMPLANT
GLOVE BIO SURGEON STRL SZ 6.5 (GLOVE) ×1 IMPLANT
GLOVE BIO SURGEON STRL SZ7.5 (GLOVE) ×2 IMPLANT
GLOVE BIO SURGEON STRL SZ8 (GLOVE) ×2 IMPLANT
GLOVE BIOGEL PI IND STRL 6.5 (GLOVE) IMPLANT
GLOVE BIOGEL PI IND STRL 7.0 (GLOVE) IMPLANT
GLOVE BIOGEL PI IND STRL 8 (GLOVE) ×1 IMPLANT
GLOVE BIOGEL PI INDICATOR 6.5 (GLOVE) ×1
GLOVE BIOGEL PI INDICATOR 7.0 (GLOVE) ×1
GLOVE BIOGEL PI INDICATOR 8 (GLOVE) ×1
GOWN STRL REUS W/ TWL LRG LVL3 (GOWN DISPOSABLE) ×1 IMPLANT
GOWN STRL REUS W/ TWL XL LVL3 (GOWN DISPOSABLE) IMPLANT
GOWN STRL REUS W/TWL LRG LVL3 (GOWN DISPOSABLE) ×2
GOWN STRL REUS W/TWL XL LVL3 (GOWN DISPOSABLE) ×6
HEAD FEM STD 32X+1 STRL (Hips) ×1 IMPLANT
HOLDER FOLEY CATH W/STRAP (MISCELLANEOUS) ×2 IMPLANT
KIT TURNOVER KIT A (KITS) ×1 IMPLANT
LINER ACET PNNCL PLUS4 NEUTRAL (Hips) IMPLANT
MANIFOLD NEPTUNE II (INSTRUMENTS) ×2 IMPLANT
PACK ANTERIOR HIP CUSTOM (KITS) ×2 IMPLANT
PENCIL SMOKE EVACUATOR COATED (MISCELLANEOUS) ×2 IMPLANT
PINNACLE PLUS 4 NEUTRAL (Hips) ×2 IMPLANT
PINNACLE SECTOR CUP 50MM (Hips) ×2 IMPLANT
SPIKE FLUID TRANSFER (MISCELLANEOUS) ×2 IMPLANT
STEM FEM ACTIS STD SZ7 (Nail) ×1 IMPLANT
STRIP CLOSURE SKIN 1/2X4 (GAUZE/BANDAGES/DRESSINGS) ×2 IMPLANT
SUT ETHIBOND NAB CT1 #1 30IN (SUTURE) ×2 IMPLANT
SUT MNCRL AB 4-0 PS2 18 (SUTURE) ×2 IMPLANT
SUT STRATAFIX 0 PDS 27 VIOLET (SUTURE) ×2
SUT VIC AB 2-0 CT1 27 (SUTURE) ×4
SUT VIC AB 2-0 CT1 TAPERPNT 27 (SUTURE) ×2 IMPLANT
SUTURE STRATFX 0 PDS 27 VIOLET (SUTURE) ×1 IMPLANT
TRAY FOLEY MTR SLVR 16FR STAT (SET/KITS/TRAYS/PACK) ×2 IMPLANT
TUBE SUCTION HIGH CAP CLEAR NV (SUCTIONS) ×2 IMPLANT

## 2021-12-31 NOTE — Op Note (Signed)
OPERATIVE REPORT- TOTAL HIP ARTHROPLASTY   PREOPERATIVE DIAGNOSIS: Osteoarthritis of the Left hip.   POSTOPERATIVE DIAGNOSIS: Osteoarthritis of the Left  hip.   PROCEDURE: Left total hip arthroplasty, anterior approach.   SURGEON: Gaynelle Arabian, MD   ASSISTANT: Theresa Duty, PA-C  ANESTHESIA:  Spinal  ESTIMATED BLOOD LOSS:-300 mL    DRAINS: None  COMPLICATIONS: None   CONDITION: PACU - hemodynamically stable.   BRIEF CLINICAL NOTE: Mary Moss is a 86 y.o. female who has advanced end-  stage arthritis of their Left  hip with progressively worsening pain and  dysfunction.The patient has failed nonoperative management and presents for  total hip arthroplasty.   PROCEDURE IN DETAIL: After successful administration of spinal  anesthetic, the traction boots for the [facility] bed were placed on both  feet and the patient was placed onto the [facility] bed, boots placed into the leg  holders. The Left hip was then isolated from the perineum with plastic  drapes and prepped and draped in the usual sterile fashion. ASIS and  greater trochanter were marked and a oblique incision was made, starting  at about 1 cm lateral and 2 cm distal to the ASIS and coursing towards  the anterior cortex of the femur. The skin was cut with a 10 blade  through subcutaneous tissue to the level of the fascia overlying the  tensor fascia lata muscle. The fascia was then incised in line with the  incision at the junction of the anterior third and posterior 2/3rd. The  muscle was teased off the fascia and then the interval between the TFL  and the rectus was developed. The Hohmann retractor was then placed at  the top of the femoral neck over the capsule. The vessels overlying the  capsule were cauterized and the fat on top of the capsule was removed.  A Hohmann retractor was then placed anterior underneath the rectus  femoris to give exposure to the entire anterior capsule. A T-shaped  capsulotomy was  performed. The edges were tagged and the femoral head  was identified.       Osteophytes are removed off the superior acetabulum.  The femoral neck was then cut in situ with an oscillating saw. Traction  was then applied to the left lower extremity utilizing the [facility]  traction. The femoral head was then removed. Retractors were placed  around the acetabulum and then circumferential removal of the labrum was  performed. Osteophytes were also removed. Reaming starts at 47 mm to  medialize and  Increased in 2 mm increments to 49 mm. We reamed in  approximately 40 degrees of abduction, 20 degrees anteversion. A 50 mm  pinnacle acetabular shell was then impacted in anatomic position under  fluoroscopic guidance with excellent purchase. We did not need to place  any additional dome screws. A 32 mm neutral + 4 marathon liner was then  placed into the acetabular shell.       The femoral lift was then placed along the lateral aspect of the femur  just distal to the vastus ridge. The leg was  externally rotated and capsule  was stripped off the inferior aspect of the femoral neck down to the  level of the lesser trochanter, this was done with electrocautery. The femur was lifted after this was performed. The  leg was then placed in an extended and adducted position essentially delivering the femur. We also removed the capsule superiorly and the piriformis from the piriformis fossa to gain excellent exposure of  the  proximal femur. Rongeur was used to remove some cancellous bone to get  into the lateral portion of the proximal femur for placement of the  initial starter reamer. The starter broaches was placed  the starter broach  and was shown to go down the center of the canal. Broaching  with the Corail system was then performed starting at size 8  coursing  Up to size 7. A size 7 had excellent torsional and rotational  and axial stability. The trial standard offset neck was then placed  with a 32+ 1  trial head. The hip was then reduced. We confirmed that  the stem was in the canal both on AP and lateral x-rays. It also has excellent sizing. The hip was reduced with outstanding stability through full extension and full external rotation.. AP pelvis was taken and the leg lengths were measured and found to be equal. Hip was then dislocated again and the femoral head and neck removed. The  femoral broach was removed. Size 7 Corail stem with a standard offset  neck was then impacted into the femur following native anteversion. Has  excellent purchase in the canal. Excellent torsional and rotational and  axial stability. It is confirmed to be in the canal on AP and lateral  fluoroscopic views. The 32 + 1 metal head was placed and the hip  reduced with outstanding stability. Again AP pelvis was taken and it  confirmed that the leg lengths were equal. The wound was then copiously  irrigated with saline solution and the capsule reattached and repaired  with Ethibond suture. 30 ml of .25% Bupivicaine was  injected into the capsule and into the edge of the tensor fascia lata as well as subcutaneous tissue. The fascia overlying the tensor fascia lata was then closed with a running #1 V-Loc. Subcu was closed with interrupted 2-0 Vicryl and subcuticular running 4-0 Monocryl. Incision was cleaned  and dried. Steri-Strips and a bulky sterile dressing applied. The patient was awakened and transported to  recovery in stable condition.        Please note that a surgical assistant was a medical necessity for this procedure to perform it in a safe and expeditious manner. Assistant was necessary to provide appropriate retraction of vital neurovascular structures and to prevent femoral fracture and allow for anatomic placement of the prosthesis.  Gaynelle Arabian, M.D.

## 2021-12-31 NOTE — Transfer of Care (Signed)
Immediate Anesthesia Transfer of Care Note  Patient: Mary Moss  Procedure(s) Performed: TOTAL HIP ARTHROPLASTY ANTERIOR APPROACH (Left: Hip)  Patient Location: PACU  Anesthesia Type:Spinal  Level of Consciousness: awake, alert  and patient cooperative  Airway & Oxygen Therapy: Patient Spontanous Breathing and Patient connected to face mask oxygen  Post-op Assessment: Report given to RN and Post -op Vital signs reviewed and stable  Post vital signs: Reviewed and stable  Last Vitals:  Vitals Value Taken Time  BP 106/64 12/31/21 1110  Temp    Pulse 71 12/31/21 1112  Resp 16 12/31/21 1112  SpO2 100 % 12/31/21 1112  Vitals shown include unvalidated device data.  Last Pain:  Vitals:   12/31/21 0800  TempSrc:   PainSc: 0-No pain         Complications: No notable events documented.

## 2021-12-31 NOTE — Evaluation (Signed)
Physical Therapy Evaluation Patient Details Name: Mary Moss MRN: [id number] DOB: March 17, 1936 Today's Date: 12/31/2021  History of Present Illness  Pt is an 86yo female presenting s/p L-THA, AA on 12/31/21. PMH: OA, GERD, HTN, L total ankle 2016, R-TKA 2017, R reverse total shoulder 2021.  Clinical Impression  LEYLANY NORED is a 86 y.o. female POD 0 s/p L-THA, AA. Patient reports independence with mobility at baseline. Patient is now limited by functional impairments (see PT problem list below) and requires min guard for transfers; upon standing pt reporting 10/10 "burning" pain in L-hip, directed pt to step pivot transfer to chair with min guard, further mobility deferred, RN notified. Patient instructed in exercise to facilitate ROM and circulation to manage edema as well as incentive spirometry. Patient will benefit from continued skilled PT interventions to address impairments and progress towards PLOF. Acute PT will follow to progress mobility and HEP in preparation for safe discharge home.       Recommendations for follow up therapy are one component of a multi-disciplinary discharge planning process, led by the attending physician.  Recommendations may be updated based on patient status, additional functional criteria and insurance authorization.  Follow Up Recommendations Follow physician's recommendations for discharge plan and follow up therapies (Pt reports she would like to be referred to OPPT, stating she is concerned for falls and needs someone to assist her to "complete my exercises and make sure I'm on track!")    Assistance Recommended at Discharge Intermittent Supervision/Assistance  Patient can return home with the following  A little help with walking and/or transfers;A little help with bathing/dressing/bathroom;Assistance with cooking/housework;Assist for transportation;Help with stairs or ramp for entrance    Equipment Recommendations None recommended by PT (Pt has  recommended DME)  Recommendations for Other Services       Functional Status Assessment Patient has had a recent decline in their functional status and demonstrates the ability to make significant improvements in function in a reasonable and predictable amount of time.     Precautions / Restrictions Precautions Precautions: Fall Restrictions Weight Bearing Restrictions: Yes LLE Weight Bearing: Weight bearing as tolerated      Mobility  Bed Mobility Overal bed mobility: Needs Assistance Bed Mobility: Supine to Sit     Supine to sit: Min assist, HOB elevated     General bed mobility comments: Pt required min assist to scoot hips EOB and elevate trunk with HOB elevated.    Transfers Overall transfer level: Needs assistance Equipment used: Rolling walker (2 wheels) Transfers: Sit to/from Stand, Bed to chair/wheelchair/BSC Sit to Stand: Min guard, From elevated surface   Step pivot transfers: Min guard, +2 safety/equipment       General transfer comment: Pt min guard for sit to stand transfer from elevated surface, VCs for sequencing, no physical assist required. Upon standing, pt reporting 10/10 burning pain in L hip, directed pt to perform Step Pivot transfer to chair. Pt min guard with VCs for sequencing, no physical assist provided or overt LOB noted.    Ambulation/Gait               General Gait Details: deferred secondary to L hip pain  Stairs            Wheelchair Mobility    Modified Rankin (Stroke Patients Only)       Balance Overall balance assessment: Needs assistance Sitting-balance support: Feet supported, No upper extremity supported Sitting balance-Leahy Scale: Good     Standing balance support: Reliant on  assistive device for balance, During functional activity, Bilateral upper extremity supported Standing balance-Leahy Scale: Poor                               Pertinent Vitals/Pain Pain Assessment Pain Assessment:  0-10 Pain Score: 5  Pain Location: left hip Pain Descriptors / Indicators: Burning, Operative site guarding Pain Intervention(s): Limited activity within patient's tolerance, Monitored during session, Repositioned, Patient requesting pain meds-RN notified, Heat applied    Home Living Family/patient expects to be discharged to:: Private residence Living Arrangements: Alone Available Help at Discharge: Family;Available 24 hours/day (Cousin Diane Nugent) Type of Home: House Home Access: Ramped entrance       Home Layout: One level Home Equipment: BSC/3in1;Shower seat;Grab bars - tub/shower;Cane - single Barista (2 wheels);Rollator (4 wheels)      Prior Function Prior Level of Function : Independent/Modified Independent             Mobility Comments: used RW at night for safety ADLs Comments: ind     Hand Dominance   Dominant Hand: Right    Extremity/Trunk Assessment   Upper Extremity Assessment Upper Extremity Assessment: Overall WFL for tasks assessed    Lower Extremity Assessment Lower Extremity Assessment: RLE deficits/detail;LLE deficits/detail RLE Deficits / Details: MMT ank DF/PF 4/5 RLE Sensation: WNL LLE Deficits / Details: MMT ank DF/PF 4/5 LLE Sensation: WNL    Cervical / Trunk Assessment Cervical / Trunk Assessment: Kyphotic  Communication   Communication: No difficulties  Cognition Arousal/Alertness: Awake/alert Behavior During Therapy: WFL for tasks assessed/performed Overall Cognitive Status: Within Functional Limits for tasks assessed                                          General Comments      Exercises Total Joint Exercises Ankle Circles/Pumps: AROM, Both, 10 reps Other Exercises Other Exercises: incentive spirometry x3, pt reached 747m, VCs for slow and controlled   Assessment/Plan    PT Assessment Patient needs continued PT services  PT Problem List Decreased strength;Decreased range of  motion;Decreased activity tolerance;Decreased balance;Decreased mobility;Decreased coordination;Pain       PT Treatment Interventions DME instruction;Gait training;Stair training;Functional mobility training;Therapeutic activities;Therapeutic exercise;Balance training;Neuromuscular re-education;Patient/family education    PT Goals (Current goals can be found in the Care Plan section)  Acute Rehab PT Goals Patient Stated Goal: Walk without pain PT Goal Formulation: With patient Time For Goal Achievement: 01/07/22 Potential to Achieve Goals: Good    Frequency 7X/week     Co-evaluation               AM-PAC PT "6 Clicks" Mobility  Outcome Measure Help needed turning from your back to your side while in a flat bed without using bedrails?: None Help needed moving from lying on your back to sitting on the side of a flat bed without using bedrails?: A Little Help needed moving to and from a bed to a chair (including a wheelchair)?: A Little Help needed standing up from a chair using your arms (e.g., wheelchair or bedside chair)?: A Little Help needed to walk in hospital room?: A Little Help needed climbing 3-5 steps with a railing? : A Little 6 Click Score: 19    End of Session Equipment Utilized During Treatment: Gait belt Activity Tolerance: Patient limited by pain Patient left: in chair;with call bell/phone  within reach;with chair alarm set Nurse Communication: Mobility status;Other (comment) (pain) PT Visit Diagnosis: Difficulty in walking, not elsewhere classified (R26.2);Pain Pain - Right/Left: Left Pain - part of body: Hip    Time: 1278-7183 PT Time Calculation (min) (ACUTE ONLY): 16 min   Charges:   PT Evaluation $PT Eval Low Complexity: 1 Low          Coolidge Breeze, PT, DPT [facility] Department Office: [phone] Pager: [phone]  Coolidge Breeze 12/31/2021, 4:55 PM

## 2021-12-31 NOTE — Progress Notes (Signed)
Pacu RN Report to floor given  Gave report to  RN. Room: Discussed surgery, meds given in OR and Pacu, VS, IV fluids given, EBL, urine output, pain and other pertinent information. Also discussed if pt had any family or friends here or belongings with them.   No hip precautions, WBAT. Spinal, moving legs, wiggling toes, able to lift hips. Pain 3/10. Tramadol '50mg'$  and Fent 143mg given. Ice to incision. No bleeding or hematoma noted.   Updated daughter via text.   Pt exits my care.

## 2021-12-31 NOTE — Anesthesia Procedure Notes (Signed)
Spinal  Patient location during procedure: OB Start time: 12/31/2021 9:22 AM End time: 12/31/2021 9:32 AM Reason for block: surgical anesthesia Staffing Performed: anesthesiologist  Anesthesiologist: Barnet Glasgow, MD Preanesthetic Checklist Completed: patient identified, IV checked, risks and benefits discussed, surgical consent, monitors and equipment checked, pre-op evaluation and timeout performed Spinal Block Patient position: sitting Prep: DuraPrep and site prepped and draped Patient monitoring: heart rate, cardiac monitor, continuous pulse ox and blood pressure Approach: right paramedian Location: L3-4 Injection technique: single-shot Needle Needle type: Pencan  Needle gauge: 24 G Needle length: 10 cm Needle insertion depth: 8 cm Assessment Sensory level: T4 Events: CSF return and second provider Additional Notes 2 Attempt (s) by CRNA @ L4-5 and L3-4. Last attempt at L3-4 paramedian Pt tolerated procedure well.

## 2021-12-31 NOTE — Anesthesia Procedure Notes (Signed)
Date/Time: 12/31/2021 9:22 AM Performed by: Sharlette Dense, CRNA Oxygen Delivery Method: Simple face mask

## 2021-12-31 NOTE — Anesthesia Postprocedure Evaluation (Signed)
Anesthesia Post Note  Patient: Mary Moss  Procedure(s) Performed: TOTAL HIP ARTHROPLASTY ANTERIOR APPROACH (Left: Hip)     Patient location during evaluation: Nursing Unit Anesthesia Type: Spinal Level of consciousness: oriented and awake and alert Pain management: pain level controlled Vital Signs Assessment: post-procedure vital signs reviewed and stable Respiratory status: spontaneous breathing and respiratory function stable Cardiovascular status: blood pressure returned to baseline and stable Postop Assessment: no headache, no backache, no apparent nausea or vomiting and patient able to bend at knees Anesthetic complications: no   No notable events documented.  Last Vitals:  Vitals:   12/31/21 1240 12/31/21 1438  BP: 123/60 139/63  Pulse: 73 96  Resp: 14 17  Temp: 36.7 C 36.4 C  SpO2: 98% 99%    Last Pain:  Vitals:   12/31/21 1438  TempSrc: Oral  PainSc:                  Barnet Glasgow

## 2021-12-31 NOTE — Interval H&P Note (Signed)
History and Physical Interval Note:  12/31/2021 8:16 AM  Mary Moss  has presented today for surgery, with the diagnosis of left hip osteoarthritis.  The various methods of treatment have been discussed with the patient and family. After consideration of risks, benefits and other options for treatment, the patient has consented to  Procedure(s): TOTAL HIP ARTHROPLASTY ANTERIOR APPROACH (Left) as a surgical intervention.  The patient's history has been reviewed, patient examined, no change in status, stable for surgery.  I have reviewed the patient's chart and labs.  Questions were answered to the patient's satisfaction.     Pilar Plate Jerrod Damiano

## 2021-12-31 NOTE — Discharge Instructions (Addendum)
Information on my medicine - XARELTO (Rivaroxaban)   Why was Xarelto prescribed for you? Xarelto was prescribed for you to reduce the risk of blood clots forming after orthopedic surgery. The medical term for these abnormal blood clots is venous thromboembolism (VTE).  What do you need to know about xarelto ? Take your Xarelto ONCE DAILY at the same time every day. You may take it either with or without food.  If you have difficulty swallowing the tablet whole, you may crush it and mix in applesauce just prior to taking your dose.  Take Xarelto exactly as prescribed by your doctor and DO NOT stop taking Xarelto without talking to the doctor who prescribed the medication.  Stopping without other VTE prevention medication to take the place of Xarelto may increase your risk of developing a clot.  After discharge, you should have regular check-up appointments with your healthcare provider that is prescribing your Xarelto.    What do you do if you miss a dose? If you miss a dose, take it as soon as you remember on the same day then continue your regularly scheduled once daily regimen the next day. Do not take two doses of Xarelto on the same day.   Important Safety Information A possible side effect of Xarelto is bleeding. You should call your healthcare provider right away if you experience any of the following: ? Bleeding from an injury or your nose that does not stop. ? Unusual colored urine (red or dark brown) or unusual colored stools (red or black). ? Unusual bruising for unknown reasons. ? A serious fall or if you hit your head (even if there is no bleeding).  Some medicines may interact with Xarelto and might increase your risk of bleeding while on Xarelto. To help avoid this, consult your healthcare provider or pharmacist prior to using any new prescription or non-prescription medications, including herbals, vitamins, non-steroidal anti-inflammatory drugs (NSAIDs) and  supplements.  This website has more information on Xarelto: www.xarelto.com.     less than one stool per week. ° °One of the most common issues patients have following surgery is constipation.  Even if you have a regular bowel pattern at home, your normal regimen is likely to be disrupted due to multiple reasons following surgery.  Combination of anesthesia, postoperative narcotics, change in appetite and fluid intake all can  affect your bowels.  In order to avoid complications following surgery, here are some recommendations in order to help you during your recovery period. ° °Colace (docusate) - Pick up an over-the-counter form of Colace or another stool softener and take twice a day as long as you are requiring postoperative pain medications.  Take with a full glass of water daily.  If you experience loose stools or diarrhea, hold the colace until you stool forms back up.  If your symptoms do not get better within 1 week or if they get worse, check with your doctor. °Dulcolax (bisacodyl) - Pick up over-the-counter and take as directed by the product packaging as needed to assist with the movement of your bowels.  Take with a full glass of water.  Use this product as needed if not relieved by Colace only.  °MiraLax (polyethylene glycol) - Pick up over-the-counter to have on hand.  MiraLax is a solution that will increase the amount of water in your bowels to assist with bowel movements.  Take as directed and can mix with a glass of water, juice, soda, coffee, or tea.  Take if you go more than two days without a movement.Do not use MiraLax more than once per day. Call your doctor if you are still constipated or irregular after using this medication for 7 days in a row. ° °If you continue to have problems with postoperative constipation, please contact the office for further assistance and recommendations.  If you experience "the worst abdominal pain ever" or develop nausea or vomiting, please contact the office immediatly for further recommendations for treatment. ° °ITCHING ° If you experience itching with your medications, try taking only a single pain pill, or even half a pain pill at a time.  You can also use Benadryl over the counter for itching or also to help with sleep.  ° °MEDICATIONS °See your medication summary on the “After Visit Summary” that the nursing staff will review with you prior to discharge.  You may have some home  medications which will be placed on hold until you complete the course of blood thinner medication.  It is important for you to complete the blood thinner medication as prescribed by your surgeon.  Continue your approved medications as instructed at time of discharge. ° °PRECAUTIONS °If you experience chest pain or shortness of breath - call 911 immediately for transfer to the hospital emergency department.  °If you develop a fever greater that 101 F, purulent drainage from wound, increased redness or drainage from wound, foul odor from the wound/dressing, or calf pain - CONTACT YOUR SURGEON.   °                                                °FOLLOW-UP APPOINTMENTS °Make sure you keep all of your appointments after your operation with your surgeon and caregivers. You should call the office at the above phone number and make an appointment for approximately two weeks after the date of your surgery or on the   date instructed by your surgeon outlined in the "After Visit Summary". ° °RANGE OF MOTION AND STRENGTHENING EXERCISES  °These exercises are designed to help you keep full movement of your hip joint. Follow your caregiver's or physical therapist's instructions. Perform all exercises about fifteen times, three times per day or as directed. Exercise both hips, even if you have had only one joint replacement. These exercises can be done on a training (exercise) mat, on the floor, on a table or on a bed. Use whatever works the best and is most comfortable for you. Use music or television while you are exercising so that the exercises are a pleasant break in your day. This will make your life better with the exercises acting as a break in routine you can look forward to.  °Lying on your back, slowly slide your foot toward your buttocks, raising your knee up off the floor. Then slowly slide your foot back down until your leg is straight again.  °Lying on your back spread your legs as far apart as you can without causing  discomfort.  °Lying on your side, raise your upper leg and foot straight up from the floor as far as is comfortable. Slowly lower the leg and repeat.  °Lying on your back, tighten up the muscle in the front of your thigh (quadriceps muscles). You can do this by keeping your leg straight and trying to raise your heel off the floor. This helps strengthen the largest muscle supporting your knee.  °Lying on your back, tighten up the muscles of your buttocks both with the legs straight and with the knee bent at a comfortable angle while keeping your heel on the floor.  ° °POST-OPERATIVE OPIOID TAPER INSTRUCTIONS: °It is important to wean off of your opioid medication as soon as possible. If you do not need pain medication after your surgery it is ok to stop day one. °Opioids include: °Codeine, Hydrocodone(Norco, Vicodin), Oxycodone(Percocet, oxycontin) and hydromorphone amongst others.  °Long term and even short term use of opiods can cause: °Increased pain response °Dependence °Constipation °Depression °Respiratory depression °And more.  °Withdrawal symptoms can include °Flu like symptoms °Nausea, vomiting °And more °Techniques to manage these symptoms °Hydrate well °Eat regular healthy meals °Stay active °Use relaxation techniques(deep breathing, meditating, yoga) °Do Not substitute Alcohol to help with tapering °If you have been on opioids for less than two weeks and do not have pain than it is ok to stop all together.  °Plan to wean off of opioids °This plan should start within one week post op of your joint replacement. °Maintain the same interval or time between taking each dose and first decrease the dose.  °Cut the total daily intake of opioids by one tablet each day °Next start to increase the time between doses. °The last dose that should be eliminated is the evening dose.  ° °IF YOU ARE TRANSFERRED TO A SKILLED REHAB FACILITY °If the patient is transferred to a skilled rehab facility following release from the  hospital, a list of the current medications will be sent to the facility for the patient to continue.  When discharged from the skilled rehab facility, please have the facility set up the patient's Home Health Physical Therapy prior to being released. Also, the skilled facility will be responsible for providing the patient with their medications at time of release from the facility to include their pain medication, the muscle relaxants, and their blood thinner medication. If the patient is still at the rehab facility   time of the two week follow up appointment, the skilled rehab facility will also need to assist the patient in arranging follow up appointment in our office and any transportation needs.  MAKE SURE YOU:  Understand these instructions.  Get help right away if you are not doing well or get worse.    DENTAL ANTIBIOTICS:  In most cases prophylactic antibiotics for Dental procdeures after total joint surgery are not necessary.  Exceptions are as follows:  1. History of prior total joint infection  2. Severely immunocompromised (Organ Transplant, cancer chemotherapy, Rheumatoid biologic meds such as Humera)  3. Poorly controlled diabetes (A1C &gt; 8.0, blood glucose over 200)  If you have one of these conditions, contact your surgeon for an antibiotic prescription, prior to your dental procedure.    Pick up stool softner and laxative for home use following surgery while on pain medications. Do not submerge incision under water. Please use good hand washing techniques while changing dressing each day. May shower starting three days after surgery. Please use a clean towel to pat the incision dry following showers. Continue to use ice for pain and swelling after surgery. Do not use any lotions or creams on the incision until instructed by your surgeon. ________________________________________________________  Information on my medicine - XARELTO (Rivaroxaban)  This  medication education was reviewed with me or my healthcare representative as part of my discharge preparation.    Why was Xarelto prescribed for you? Xarelto was prescribed for you to reduce the risk of blood clots forming after orthopedic surgery. The medical term for these abnormal blood clots is venous thromboembolism (VTE).  What do you need to know about xarelto ? Take your Xarelto ONCE DAILY at the same time every day. You may take it either with or without food.  If you have difficulty swallowing the tablet whole, you may crush it and mix in applesauce just prior to taking your dose.  Take Xarelto exactly as prescribed by your doctor and DO NOT stop taking Xarelto without talking to the doctor who prescribed the medication.  Stopping without other VTE prevention medication to take the place of Xarelto may increase your risk of developing a clot.  After discharge, you should have regular check-up appointments with your healthcare provider that is prescribing your Xarelto.    What do you do if you miss a dose? If you miss a dose, take it as soon as you remember on the same day then continue your regularly scheduled once daily regimen the next day. Do not take two doses of Xarelto on the same day.   Important Safety Information A possible side effect of Xarelto is bleeding. You should call your healthcare provider right away if you experience any of the following: Bleeding from an injury or your nose that does not stop. Unusual colored urine (red or dark brown) or unusual colored stools (red or black). Unusual bruising for unknown reasons. A serious fall or if you hit your head (even if there is no bleeding).  Some medicines may interact with Xarelto and might increase your risk of bleeding while on Xarelto. To help avoid this, consult your healthcare provider or pharmacist prior to using any new prescription or non-prescription medications, including herbals, vitamins,  non-steroidal anti-inflammatory drugs (NSAIDs) and supplements.  This website has more information on Xarelto: www.xarelto.com.   

## 2022-01-01 ENCOUNTER — Encounter (HOSPITAL_COMMUNITY): Payer: Self-pay | Admitting: Orthopedic Surgery

## 2022-01-01 DIAGNOSIS — M1612 Unilateral primary osteoarthritis, left hip: Secondary | ICD-10-CM | POA: Diagnosis not present

## 2022-01-01 LAB — CBC
HCT: 35.6 % — ABNORMAL LOW (ref 36.0–46.0)
Hemoglobin: 11.3 g/dL — ABNORMAL LOW (ref 12.0–15.0)
MCH: 31.2 pg (ref 26.0–34.0)
MCHC: 31.7 g/dL (ref 30.0–36.0)
MCV: 98.3 fL (ref 80.0–100.0)
Platelets: 223 10*3/uL (ref 150–400)
RBC: 3.62 MIL/uL — ABNORMAL LOW (ref 3.87–5.11)
RDW: 11.9 % (ref 11.5–15.5)
WBC: 11.4 10*3/uL — ABNORMAL HIGH (ref 4.0–10.5)
nRBC: 0 % (ref 0.0–0.2)

## 2022-01-01 LAB — BASIC METABOLIC PANEL
Anion gap: 7 (ref 5–15)
BUN: 21 mg/dL (ref 8–23)
CO2: 29 mmol/L (ref 22–32)
Calcium: 8.8 mg/dL — ABNORMAL LOW (ref 8.9–10.3)
Chloride: 104 mmol/L (ref 98–111)
Creatinine, Ser: 0.66 mg/dL (ref 0.44–1.00)
GFR, Estimated: >60 mL/min (ref >60)
Glucose, Bld: 149 mg/dL — ABNORMAL HIGH (ref 70–99)
Potassium: 4 mmol/L (ref 3.5–5.1)
Sodium: 140 mmol/L (ref 135–145)

## 2022-01-01 MED ORDER — METHOCARBAMOL 500 MG PO TABS: 500.0000 mg | ORAL_TABLET | Freq: Four times a day (QID) | ORAL | 0 refills | Status: DC | PRN

## 2022-01-01 MED ORDER — HYDROCODONE-ACETAMINOPHEN 5-325 MG PO TABS: 1.0000 | ORAL_TABLET | Freq: Four times a day (QID) | ORAL | 0 refills | Status: DC | PRN

## 2022-01-01 MED ORDER — TRAMADOL HCL 50 MG PO TABS: 50.0000 mg | ORAL_TABLET | Freq: Four times a day (QID) | ORAL | 0 refills | Status: DC | PRN

## 2022-01-01 MED ORDER — RIVAROXABAN 10 MG PO TABS: 10.0000 mg | ORAL_TABLET | Freq: Every day | ORAL | 0 refills | Status: DC

## 2022-01-01 NOTE — Progress Notes (Signed)
   Subjective: 1 Day Post-Op Procedure(s) (LRB): TOTAL HIP ARTHROPLASTY ANTERIOR APPROACH (Left) Patient seen in rounds by Dr. Wynelle Link. Patient is well, and has had no acute complaints or problems. Foley cath removed this AM. Denies SOB or chest pain. Patient reports pain as moderate.  Reports she had a "burning" pain in the left hip yesterday but it has improved. We will continue physical therapy today.   Objective: Vital signs in last 24 hours: Temp:  [97.1 F (36.2 C)-98.1 F (36.7 C)] 97.8 F (36.6 C) (06/08 0607) Pulse Rate:  [71-101] 79 (06/08 0607) Resp:  [10-19] 17 (06/08 0607) BP: (106-148)/(54-69) 128/54 (06/08 0607) SpO2:  [87 %-100 %] 95 % (06/08 0607) Weight:  [95.2 kg-95.3 kg] 95.2 kg (06/07 1240)  Intake/Output from previous day:  Intake/Output Summary (Last 24 hours) at 01/01/2022 0729 Last data filed at 01/01/2022 0536 Gross per 24 hour  Intake 3172.98 ml  Output 2625 ml  Net 547.98 ml     Intake/Output this shift: No intake/output data recorded.  Labs: Recent Labs    01/01/22 0347  HGB 11.3*   Recent Labs    01/01/22 0347  WBC 11.4*  RBC 3.62*  HCT 35.6*  PLT 223   Recent Labs    01/01/22 0347  NA 140  K 4.0  CL 104  CO2 29  BUN 21  CREATININE 0.66  GLUCOSE 149*  CALCIUM 8.8*   No results for input(s): "LABPT", "INR" in the last 72 hours.  Exam: General - Patient is Alert and Oriented Extremity - Neurologically intact Neurovascular intact Sensation intact distally Dorsiflexion/Plantar flexion intact Dressing - dressing C/D/I Motor Function - intact, moving foot and toes well on exam.  Past Medical History:  Diagnosis Date   Ankle fracture fx 13 yrs ago, anklw swells off and on, has cramps in ankle also   left, to seee dr hewitt about 02-16-14   Arthritis    osteo and RA   Basal cell carcinoma 2015   generalized   Difficult intubation    GERD (gastroesophageal reflux disease)    OTC med   pt. denies   Hypertension     Pneumonia    Pre-diabetes     Assessment/Plan: 1 Day Post-Op Procedure(s) (LRB): TOTAL HIP ARTHROPLASTY ANTERIOR APPROACH (Left) Principal Problem:   Osteoarthritis of left hip  Estimated body mass index is 31.93 kg/m as calculated from the following:   Height as of this encounter: '5\' 8"'$  (1.727 m).   Weight as of this encounter: 95.2 kg. Advance diet Up with therapy D/C IV fluids  DVT Prophylaxis - Xarelto Weight bearing as tolerated. Continue physical therapy.  Plan is to go Home after hospital stay. Expected discharge today pending progress after two sessions of physical therapy. Will do a HEP once discharged. Follow-up in clinic in 2 weeks.  The PDMP database was reviewed today prior to any opioid medications being prescribed to this patient.  R. Jaynie Bream, PA-C Orthopedic Surgery [phone] 01/01/2022, 7:29 AM

## 2022-01-01 NOTE — Plan of Care (Signed)

## 2022-01-01 NOTE — Progress Notes (Signed)
Physical Therapy Treatment Patient Details Name: Mary Moss MRN: [id number] DOB: 1936-05-20 Today's Date: 01/01/2022   History of Present Illness Pt is an 86yo female presenting s/p L-THA, AA on 12/31/21. PMH: OA, GERD, HTN, L total ankle 2016, R-TKA 2017, R reverse total shoulder 2021.    PT Comments    Pt seen for the first of two sessions POD1.  Vitals taken at beginning of session 119/62. Pt required min assist for bed mobility, min guard for transfer and ambulation with RW ~74f in hallway, further ambulation limited by burning pain in anterior portion of L hip and thigh, RN provided pain medications during session. Pt completed supine HEP with multimodal cuing and use of gait belt to provide active-assist movements. Will follow up with second session to continue ambulation and HEP progression. We will continue to follow her acutely.   Recommendations for follow up therapy are one component of a multi-disciplinary discharge planning process, led by the attending physician.  Recommendations may be updated based on patient status, additional functional criteria and insurance authorization.  Follow Up Recommendations  Follow physician's recommendations for discharge plan and follow up therapies (Pt reports she would like to be referred to OPPT, stating she is concerned for falls and needs someone to assist her to "complete my exercises and make sure I'm on track!")     Assistance Recommended at Discharge Intermittent Supervision/Assistance  Patient can return home with the following A little help with walking and/or transfers;A little help with bathing/dressing/bathroom;Assistance with cooking/housework;Assist for transportation;Help with stairs or ramp for entrance   Equipment Recommendations  None recommended by PT (Pt has recommended DME)    Recommendations for Other Services       Precautions / Restrictions Precautions Precautions: Fall Restrictions Weight Bearing  Restrictions: Yes LLE Weight Bearing: Weight bearing as tolerated     Mobility  Bed Mobility Overal bed mobility: Needs Assistance Bed Mobility: Supine to Sit     Supine to sit: Min assist, HOB elevated     General bed mobility comments: Pt required min assist to scoot hips EOB and elevate trunk with HOB elevated.    Transfers Overall transfer level: Needs assistance Equipment used: Rolling walker (2 wheels) Transfers: Sit to/from Stand, Bed to chair/wheelchair/BSC Sit to Stand: Min guard, From elevated surface           General transfer comment: Pt min guard for sit to stand transfer from elevated surface, VCs for sequencing, no physical assist required.    Ambulation/Gait Ambulation/Gait assistance: Min guard Gait Distance (Feet): 20 Feet Assistive device: Rolling walker (2 wheels) Gait Pattern/deviations: Antalgic, Step-to pattern       General Gait Details: Pt ambulated with RW and min guard assist +2 for recliner follow, no physical assist required or overt LOB noted. demonstrated antalgic gait pattern with decreased WB on L side, pt did not place entirety of L foot on floor until cued to do so; after cuing able to step safely. After 217fpt reporting fatigue, burning in L hip "10/10." Directed pt to sit in recliner, further mobility deferred.   Stairs             Wheelchair Mobility    Modified Rankin (Stroke Patients Only)       Balance Overall balance assessment: Needs assistance Sitting-balance support: Feet supported, No upper extremity supported Sitting balance-Leahy Scale: Good     Standing balance support: Reliant on assistive device for balance, During functional activity, Bilateral upper extremity supported Standing balance-Leahy Scale:  Poor                              Cognition Arousal/Alertness: Awake/alert Behavior During Therapy: WFL for tasks assessed/performed Overall Cognitive Status: Within Functional Limits for  tasks assessed                                          Exercises Total Joint Exercises Ankle Circles/Pumps: AROM, Both, 10 reps Quad Sets: AROM, Both, 10 reps Short Arc Quad: AROM, Left, 10 reps Heel Slides: 10 reps, AAROM, Left Hip ABduction/ADduction: AAROM, Left, 10 reps Knee Flexion: AROM, Seated, Other (comment) (educated, not performed)    General Comments        Pertinent Vitals/Pain Pain Assessment Pain Assessment: 0-10 Pain Score: 6  Pain Location: left hip Pain Descriptors / Indicators: Burning, Operative site guarding Pain Intervention(s): Limited activity within patient's tolerance, Monitored during session, Repositioned, RN gave pain meds during session, Ice applied    Home Living                          Prior Function            PT Goals (current goals can now be found in the care plan section) Acute Rehab PT Goals Patient Stated Goal: Walk without pain PT Goal Formulation: With patient Time For Goal Achievement: 01/07/22 Potential to Achieve Goals: Good Progress towards PT goals: Progressing toward goals    Frequency    7X/week      PT Plan Current plan remains appropriate    Co-evaluation              AM-PAC PT "6 Clicks" Mobility   Outcome Measure  Help needed turning from your back to your side while in a flat bed without using bedrails?: None Help needed moving from lying on your back to sitting on the side of a flat bed without using bedrails?: A Little Help needed moving to and from a bed to a chair (including a wheelchair)?: A Little Help needed standing up from a chair using your arms (e.g., wheelchair or bedside chair)?: A Little Help needed to walk in hospital room?: A Little Help needed climbing 3-5 steps with a railing? : A Little 6 Click Score: 19    End of Session Equipment Utilized During Treatment: Gait belt Activity Tolerance: Patient limited by pain Patient left: in chair;with call  bell/phone within reach;with chair alarm set Nurse Communication: Mobility status;Other (comment) (pain) PT Visit Diagnosis: Difficulty in walking, not elsewhere classified (R26.2);Pain Pain - Right/Left: Left Pain - part of body: Hip     Time: 1245-8099 PT Time Calculation (min) (ACUTE ONLY): 32 min  Charges:  $Gait Training: 8-22 mins $Therapeutic Exercise: 8-22 mins                     Coolidge Breeze, PT, DPT [facility] Office: [phone] Pager: [phone]   Coolidge Breeze 01/01/2022, 10:33 AM

## 2022-01-01 NOTE — Progress Notes (Signed)
Provided discharge education/instructions, all questions and concerns addressed. Pt not in acute distress, discharged home with belongings accompanied by granddaughter.

## 2022-01-01 NOTE — Progress Notes (Signed)
Physical Therapy Treatment Patient Details Name: Mary Moss MRN: [id number] DOB: August 26, 1935 Today's Date: 01/01/2022   History of Present Illness Pt is an 86yo female presenting s/p L-THA, AA on 12/31/21. PMH: OA, GERD, HTN, L total ankle 2016, R-TKA 2017, R reverse total shoulder 2021.    PT Comments    Pt seen for second of two visits POD1.  Pt min guard for transfers and ambulation 152f in hallway with RW and +2 recliner follow for safety only, no physical assist provided. Reviewed entirety of HEP including progression and importance of walking program, pt verbalized understanding. Pt has met her mobility goals for safe discharge home. All education has been completed and the patient has no further questions.  See below for any follow-up Physical Therapy or equipment needs. PT is signing off. Thank you for this referral.     Recommendations for follow up therapy are one component of a multi-disciplinary discharge planning process, led by the attending physician.  Recommendations may be updated based on patient status, additional functional criteria and insurance authorization.  Follow Up Recommendations  Follow physician's recommendations for discharge plan and follow up therapies (Pt reports she would like to be referred to OPPT, stating she is concerned for falls and needs someone to assist her to "complete my exercises and make sure I'm on track!")     Assistance Recommended at Discharge Intermittent Supervision/Assistance  Patient can return home with the following A little help with walking and/or transfers;A little help with bathing/dressing/bathroom;Assistance with cooking/housework;Assist for transportation;Help with stairs or ramp for entrance   Equipment Recommendations  None recommended by PT (Pt has recommended DME)    Recommendations for Other Services       Precautions / Restrictions Precautions Precautions: Fall Restrictions Weight Bearing Restrictions:  Yes LLE Weight Bearing: Weight bearing as tolerated     Mobility  Bed Mobility Overal bed mobility: Needs Assistance Bed Mobility: Supine to Sit     Supine to sit: Min assist, HOB elevated     General bed mobility comments: Pt seated in recliner at entry and exit.    Transfers Overall transfer level: Needs assistance Equipment used: Rolling walker (2 wheels) Transfers: Sit to/from Stand, Bed to chair/wheelchair/BSC Sit to Stand: Min guard           General transfer comment: Pt min guard for sit to stand transfer with VCs for sequencing, no physical assist required.    Ambulation/Gait Ambulation/Gait assistance: Min guard Gait Distance (Feet): 125 Feet Assistive device: Rolling walker (2 wheels) Gait Pattern/deviations: Antalgic, Step-to pattern Gait velocity: decreased     General Gait Details: Pt ambulated with RW and min guard assist +2 for recliner follow, no physical assist required or overt LOB noted. demonstrated antalgic gait pattern with decreased WB on L side, pt did not place entirety of L foot on floor until cued to do so; after cuing able to step safely.   Stairs             Wheelchair Mobility    Modified Rankin (Stroke Patients Only)       Balance Overall balance assessment: Needs assistance Sitting-balance support: Feet supported, No upper extremity supported Sitting balance-Leahy Scale: Good     Standing balance support: Reliant on assistive device for balance, During functional activity, Bilateral upper extremity supported Standing balance-Leahy Scale: Poor  Cognition Arousal/Alertness: Awake/alert Behavior During Therapy: WFL for tasks assessed/performed Overall Cognitive Status: Within Functional Limits for tasks assessed                                          Exercises Total Joint Exercises Ankle Circles/Pumps: AROM, Both, 10 reps Quad Sets: AROM, Both, 10  reps Short Arc Quad: AROM, Left, 10 reps Heel Slides: 10 reps, AAROM, Left Hip ABduction/ADduction: AAROM, Left, 10 reps Knee Flexion: AROM, Seated, Other (comment) (educated, not performed) Marching in Standing: Other (comment) (edcuated not performed) Standing Hip Extension: Other (comment) (edcuated not performed) Other Exercises Other Exercises: edcuated not performed: Hamstring raises in RW, standing hip ABD    General Comments        Pertinent Vitals/Pain Pain Assessment Pain Assessment: 0-10 Pain Score: 2  Pain Location: left hip Pain Descriptors / Indicators: Burning, Operative site guarding Pain Intervention(s): Limited activity within patient's tolerance, Monitored during session, Repositioned, Ice applied    Home Living                          Prior Function            PT Goals (current goals can now be found in the care plan section) Acute Rehab PT Goals Patient Stated Goal: Walk without pain PT Goal Formulation: With patient Time For Goal Achievement: 01/07/22 Potential to Achieve Goals: Good Progress towards PT goals: Progressing toward goals    Frequency    7X/week      PT Plan Current plan remains appropriate    Co-evaluation              AM-PAC PT "6 Clicks" Mobility   Outcome Measure  Help needed turning from your back to your side while in a flat bed without using bedrails?: None Help needed moving from lying on your back to sitting on the side of a flat bed without using bedrails?: A Little Help needed moving to and from a bed to a chair (including a wheelchair)?: A Little Help needed standing up from a chair using your arms (e.g., wheelchair or bedside chair)?: A Little Help needed to walk in hospital room?: A Little Help needed climbing 3-5 steps with a railing? : A Little 6 Click Score: 19    End of Session Equipment Utilized During Treatment: Gait belt Activity Tolerance: Patient tolerated treatment well;No  increased pain Patient left: in chair;with call bell/phone within reach;with chair alarm set Nurse Communication: Mobility status PT Visit Diagnosis: Difficulty in walking, not elsewhere classified (R26.2);Pain Pain - Right/Left: Left Pain - part of body: Hip     Time: 5643-3295 PT Time Calculation (min) (ACUTE ONLY): 20 min  Charges:  $Gait Training: 8-22 mins $Therapeutic Exercise: 8-22 mins                     Coolidge Breeze, PT, DPT [facility] Office: [phone] Pager: [phone]   Coolidge Breeze 01/01/2022, 12:52 PM

## 2022-01-01 NOTE — TOC Transition Note (Signed)
Transition of Care [facility]) - CM/SW Discharge Note  Patient Details  Name: Mary Moss MRN: [id number] Date of Birth: 01/27/1936  Transition of Care [facility] CM/SW Contact:  Sherie Don, LCSW Phone Number: 01/01/2022, 10:42 AM  Clinical Narrative: Patient is expected to discharge home after working with PT. CSW met with patient to confirm discharge plan. Patient will go home with a home exercise program (HEP). Patient has a rolling walker, rollator, and 3N1 at home so there are no DME needs at this time. TOC signing off.  Final next level of care: Home/Self Care Barriers to Discharge: No Barriers Identified  Patient Goals and CMS Choice Patient states their goals for this hospitalization and ongoing recovery are:: Discharge home with HEP Choice offered to / list presented to : NA  Discharge Plan and Services        DME Arranged: N/A DME Agency: NA  Readmission Risk Interventions     No data to display

## 2022-01-02 NOTE — Discharge Summary (Signed)
Physician Discharge Summary   Patient ID: Mary Moss MRN: [id number] DOB/AGE: [birth year] 86 y.o.  Admit date: 12/31/2021 Discharge date: 01/01/2022  Primary Diagnosis: Osteoarthritis of the left hip   Admission Diagnoses:  Past Medical History:  Diagnosis Date   Ankle fracture fx 13 yrs ago, anklw swells off and on, has cramps in ankle also   left, to seee dr hewitt about 02-16-14   Arthritis    osteo and RA   Basal cell carcinoma 2015   generalized   Difficult intubation    GERD (gastroesophageal reflux disease)    OTC med   pt. denies   Hypertension    Pneumonia    Pre-diabetes    Discharge Diagnoses:   Principal Problem:   Osteoarthritis of left hip  Estimated body mass index is 31.93 kg/m as calculated from the following:   Height as of this encounter: '5\' 8"'  (1.727 m).   Weight as of this encounter: 95.2 kg.  Procedure:  Procedure(s) (LRB): TOTAL HIP ARTHROPLASTY ANTERIOR APPROACH (Left)   Consults: None  HPI: Mary Moss is a 86 y.o. female who has advanced end-stage arthritis of their left hip with progressively worsening pain and dysfunction. The patient has failed nonoperative management and presents for total hip arthroplasty.   Laboratory Data: Admission on 12/31/2021, Discharged on 01/01/2022  Component Date Value Ref Range Status   ABO/RH(D) 12/31/2021 A POS   Final   Antibody Screen 12/31/2021 NEG   Final   Sample Expiration 12/31/2021    Final                   Value:01/03/2022,2359 Performed at [facility], [place] [address], [place],  [address]    Glucose-Capillary 12/31/2021 107 (H)  70 - 99 mg/dL Final   Glucose reference range applies only to samples taken after fasting for at least 8 hours.   Comment 1 12/31/2021 Notify RN   Final   WBC 01/01/2022 11.4 (H)  4.0 - 10.5 K/uL Final   RBC 01/01/2022 3.62 (L)  3.87 - 5.11 MIL/uL Final   Hemoglobin 01/01/2022 11.3 (L)  12.0 - 15.0 g/dL Final   HCT 01/01/2022 35.6  (L)  36.0 - 46.0 % Final   MCV 01/01/2022 98.3  80.0 - 100.0 fL Final   MCH 01/01/2022 31.2  26.0 - 34.0 pg Final   MCHC 01/01/2022 31.7  30.0 - 36.0 g/dL Final   RDW 01/01/2022 11.9  11.5 - 15.5 % Final   Platelets 01/01/2022 223  150 - 400 K/uL Final   nRBC 01/01/2022 0.0  0.0 - 0.2 % Final   Performed at [facility], [place] [address] [place], [place] [address]   Sodium 01/01/2022 140  135 - 145 mmol/L Final   Potassium 01/01/2022 4.0  3.5 - 5.1 mmol/L Final   Chloride 01/01/2022 104  98 - 111 mmol/L Final   CO2 01/01/2022 29  22 - 32 mmol/L Final   Glucose, Bld 01/01/2022 149 (H)  70 - 99 mg/dL Final   Glucose reference range applies only to samples taken after fasting for at least 8 hours.   BUN 01/01/2022 21  8 - 23 mg/dL Final   Creatinine, Ser 01/01/2022 0.66  0.44 - 1.00 mg/dL Final   Calcium 01/01/2022 8.8 (L)  8.9 - 10.3 mg/dL Final   GFR, Estimated 01/01/2022 >60  >60 mL/min Final   Comment: (NOTE) Calculated using the CKD-EPI Creatinine Equation (2021)    Anion gap 01/01/2022  7  5 - 15 Final   Performed at [place] [address] [place], [place] [address]  Hospital Outpatient Visit on 12/17/2021  Component Date Value Ref Range Status   MRSA, PCR 12/17/2021 NEGATIVE  NEGATIVE Final   Staphylococcus aureus 12/17/2021 NEGATIVE  NEGATIVE Final   Comment: (NOTE) The Xpert SA Assay (FDA approved for NASAL specimens in patients 30 years of age and older), is one component of a comprehensive surveillance program. It is not intended to diagnose infection nor to guide or monitor treatment. Performed at [facility], [place] [address] [place], [place] [address]    Sodium 12/17/2021 143  135 - 145 mmol/L Final   Potassium 12/17/2021 4.1  3.5 - 5.1 mmol/L Final   Chloride 12/17/2021 105  98 - 111 mmol/L Final   CO2 12/17/2021 30  22 - 32 mmol/L Final   Glucose, Bld 12/17/2021 93  70 - 99 mg/dL Final   Glucose  reference range applies only to samples taken after fasting for at least 8 hours.   BUN 12/17/2021 18  8 - 23 mg/dL Final   Creatinine, Ser 12/17/2021 1.02 (H)  0.44 - 1.00 mg/dL Final   Calcium 12/17/2021 9.6  8.9 - 10.3 mg/dL Final   GFR, Estimated 12/17/2021 55 (L)  >60 mL/min Final   Comment: (NOTE) Calculated using the CKD-EPI Creatinine Equation (2021)    Anion gap 12/17/2021 8  5 - 15 Final   Performed at [facility], [place] [address], [place], [place] [address]   WBC 12/17/2021 6.1  4.0 - 10.5 K/uL Final   RBC 12/17/2021 4.33  3.87 - 5.11 MIL/uL Final   Hemoglobin 12/17/2021 13.8  12.0 - 15.0 g/dL Final   HCT 12/17/2021 42.7  36.0 - 46.0 % Final   MCV 12/17/2021 98.6  80.0 - 100.0 fL Final   MCH 12/17/2021 31.9  26.0 - 34.0 pg Final   MCHC 12/17/2021 32.3  30.0 - 36.0 g/dL Final   RDW 12/17/2021 12.2  11.5 - 15.5 % Final   Platelets 12/17/2021 297  150 - 400 K/uL Final   nRBC 12/17/2021 0.0  0.0 - 0.2 % Final   Performed at [facility], [place] [address] [place], [place] [address]   Glucose-Capillary 12/17/2021 89  70 - 99 mg/dL Final   Glucose reference range applies only to samples taken after fasting for at least 8 hours.  Office Visit on 11/20/2021  Component Date Value Ref Range Status   HB A1C (BAYER DCA - WAIVED) 11/20/2021 5.9 (H)  4.8 - 5.6 % Final   Comment:          Prediabetes: 5.7 - 6.4          Diabetes: >6.4          Glycemic control for adults with diabetes: <7.0    Glucose 11/20/2021 87  70 - 99 mg/dL Final   BUN 11/20/2021 16  8 - 27 mg/dL Final   Creatinine, Ser 11/20/2021 0.88  0.57 - 1.00 mg/dL Final   eGFR 11/20/2021 65  >59 mL/min/1.73 Final   BUN/Creatinine Ratio 11/20/2021 18  12 - 28 Final   Sodium 11/20/2021 143  134 - 144 mmol/L Final   Potassium 11/20/2021 4.2  3.5 - 5.2 mmol/L Final   Chloride 11/20/2021 103  96 - 106 mmol/L Final   CO2 11/20/2021 21  20 - 29 mmol/L Final   Calcium 11/20/2021 9.5  8.7 -  10.3  mg/dL Final   Total Protein 11/20/2021 6.4  6.0 - 8.5 g/dL Final   Albumin 11/20/2021 3.8  3.6 - 4.6 g/dL Final   Globulin, Total 11/20/2021 2.6  1.5 - 4.5 g/dL Final   Albumin/Globulin Ratio 11/20/2021 1.5  1.2 - 2.2 Final   Bilirubin Total 11/20/2021 0.5  0.0 - 1.2 mg/dL Final   Alkaline Phosphatase 11/20/2021 83  44 - 121 IU/L Final   AST 11/20/2021 23  0 - 40 IU/L Final   ALT 11/20/2021 15  0 - 32 IU/L Final   WBC 11/20/2021 6.7  3.4 - 10.8 x10E3/uL Final   RBC 11/20/2021 4.19  3.77 - 5.28 x10E6/uL Final   Hemoglobin 11/20/2021 13.2  11.1 - 15.9 g/dL Final   Hematocrit 11/20/2021 39.6  34.0 - 46.6 % Final   MCV 11/20/2021 95  79 - 97 fL Final   MCH 11/20/2021 31.5  26.6 - 33.0 pg Final   MCHC 11/20/2021 33.3  31.5 - 35.7 g/dL Final   RDW 11/20/2021 12.1  11.7 - 15.4 % Final   Platelets 11/20/2021 272  150 - 450 x10E3/uL Final   Neutrophils 11/20/2021 56  Not Estab. % Final   Lymphs 11/20/2021 32  Not Estab. % Final   Monocytes 11/20/2021 7  Not Estab. % Final   Eos 11/20/2021 3  Not Estab. % Final   Basos 11/20/2021 2  Not Estab. % Final   Neutrophils Absolute 11/20/2021 3.7  1.4 - 7.0 x10E3/uL Final   Lymphocytes Absolute 11/20/2021 2.1  0.7 - 3.1 x10E3/uL Final   Monocytes Absolute 11/20/2021 0.5  0.1 - 0.9 x10E3/uL Final   EOS (ABSOLUTE) 11/20/2021 0.2  0.0 - 0.4 x10E3/uL Final   Basophils Absolute 11/20/2021 0.1  0.0 - 0.2 x10E3/uL Final   Immature Granulocytes 11/20/2021 0  Not Estab. % Final   Immature Grans (Abs) 11/20/2021 0.0  0.0 - 0.1 x10E3/uL Final     X-Rays:DG Pelvis Portable  Result Date: 12/31/2021 CLINICAL DATA:  Post hip replacement EXAM: PORTABLE PELVIS 1-2 VIEWS COMPARISON:  Portable exam 1116 hours compared to 08/08/2020 FINDINGS: Osseous demineralization. New LEFT hip prosthesis without fracture or dislocation on single AP view. Mild narrowing of RIGHT hip joint. Soft tissues unremarkable. IMPRESSION: LEFT hip prosthesis without acute complication.  Electronically Signed   By: Lavonia Dana M.D.   On: 12/31/2021 12:57   DG HIP UNILAT WITH PELVIS 1V LEFT  Result Date: 12/31/2021 CLINICAL DATA:  Total left hip arthroplasty EXAM: DG HIP (WITH OR WITHOUT PELVIS) 1V*L* COMPARISON:  None Available. FINDINGS: Multiple intraoperative fluoroscopic spot images are provided. Interval left total hip arthroplasty. Normal alignment. FLUOROSCOPY TIME:  Radiation Exposure Index: 0.67 mGy IMPRESSION: 1. Interval left total hip arthroplasty. Electronically Signed   By: Kathreen Devoid M.D.   On: 12/31/2021 11:15   DG C-Arm 1-60 Min-No Report  Result Date: 12/31/2021 Fluoroscopy was utilized by the requesting physician.  No radiographic interpretation.    EKG: Orders placed or performed in visit on 11/20/21   EKG 12-Lead     Hospital Course: Mary Moss is a 86 y.o. who was admitted to [facility]. They were brought to the operating room on 12/31/2021 and underwent Procedure(s): [place].  Patient tolerated the procedure well and was later transferred to the recovery room and then to the orthopaedic floor for postoperative care. They were given PO and IV analgesics for pain control following their surgery. They were given 24 hours of postoperative antibiotics of  Anti-infectives (From admission, onward)    Start     Dose/Rate Route Frequency Ordered Stop   01/01/22 1000  hydroxychloroquine (PLAQUENIL) tablet 400 mg  Status:  Discontinued        400 mg Oral Daily 12/31/21 1250 01/01/22 1941   12/31/21 1530  ceFAZolin (ANCEF) IVPB 2g/100 mL premix        2 g 200 mL/hr over 30 Minutes Intravenous Every 6 hours 12/31/21 1250 12/31/21 2221   12/31/21 0745  ceFAZolin (ANCEF) IVPB 2g/100 mL premix        2 g 200 mL/hr over 30 Minutes Intravenous On call to O.R. 12/31/21 0732 12/31/21 0940     and started on DVT prophylaxis in the form of Xarelto.   PT and OT were ordered for total joint protocol. Discharge planning  consulted to help with postop disposition and equipment needs.  Patient had a good night on the evening of surgery. They started to get up OOB with therapy on POD #0. Pt was seen during rounds and was ready to go home pending progress with therapy. She worked with therapy on POD #1 and was meeting her goals. Pt was discharged to home later that day in stable condition.  Diet: Regular diet Activity: WBAT Follow-up: in 2 weeks Disposition: Home Discharged Condition: stable   Discharge Instructions     Call MD / Call 911   Complete by: As directed    If you experience chest pain or shortness of breath, CALL 911 and be transported to the hospital emergency room.  If you develope a fever above 101 F, pus (white drainage) or increased drainage or redness at the wound, or calf pain, call your surgeon's office.   Change dressing   Complete by: As directed    You have an adhesive waterproof bandage over the incision. Leave this in place until your first follow-up appointment. Once you remove this you will not need to place another bandage.   Constipation Prevention   Complete by: As directed    Drink plenty of fluids.  Prune juice may be helpful.  You may use a stool softener, such as Colace (over the counter) 100 mg twice a day.  Use MiraLax (over the counter) for constipation as needed.   Diet - low sodium heart healthy   Complete by: As directed    Do not sit on low chairs, stoools or toilet seats, as it may be difficult to get up from low surfaces   Complete by: As directed    Driving restrictions   Complete by: As directed    No driving for two weeks   Post-operative opioid taper instructions:   Complete by: As directed    POST-OPERATIVE OPIOID TAPER INSTRUCTIONS: It is important to wean off of your opioid medication as soon as possible. If you do not need pain medication after your surgery it is ok to stop day one. Opioids include: Codeine, Hydrocodone(Norco, Vicodin),  Oxycodone(Percocet, oxycontin) and hydromorphone amongst others.  Long term and even short term use of opiods can cause: Increased pain response Dependence Constipation Depression Respiratory depression And more.  Withdrawal symptoms can include Flu like symptoms Nausea, vomiting And more Techniques to manage these symptoms Hydrate well Eat regular healthy meals Stay active Use relaxation techniques(deep breathing, meditating, yoga) Do Not substitute Alcohol to help with tapering If you have been on opioids for less than two weeks and do not have pain than it is ok to stop all together.  Plan to  wean off of opioids This plan should start within one week post op of your joint replacement. Maintain the same interval or time between taking each dose and first decrease the dose.  Cut the total daily intake of opioids by one tablet each day Next start to increase the time between doses. The last dose that should be eliminated is the evening dose.      TED hose   Complete by: As directed    Use stockings (TED hose) for three weeks on both leg(s).  You may remove them at night for sleeping.   Weight bearing as tolerated   Complete by: As directed       Allergies as of 01/01/2022       Reactions   Statins    Simvastatin, crestor, livalo Not able to walk because so much muscle/joint pain   Neosporin [neomycin-bacitracin Zn-polymyx] Rash   Polysporin [bacitracin-polymyxin B] Rash        Medication List     TAKE these medications    Albuterol Sulfate 108 (90 Base) MCG/ACT Aepb Commonly known as: PROAIR RESPICLICK Inhale 1-2 puffs into the lungs every 6 (six) hours as needed (shortness of breath, wheezing). Notes to patient: Resume home regimen   Breztri Aerosphere 160-9-4.8 MCG/ACT Aero Generic drug: Budeson-Glycopyrrol-Formoterol Inhale 2 puffs into the lungs 2 (two) times daily. Notes to patient: Resume home regimen   clobetasol cream 0.05 % Commonly known as:  TEMOVATE Apply 1 application topically 2 (two) times daily as needed (vaginal irritation.). Notes to patient: Resume home regimen   D3-50 1.25 MG (50000 UT) capsule Generic drug: Cholecalciferol Take 50,000 Units by mouth every 'Sunday. Notes to patient: Resume home regimen   HAIR/SKIN/NAILS PO Take 1 tablet by mouth daily. Notes to patient: Resume home regimen   hydrochlorothiazide 25 MG tablet Commonly known as: HYDRODIURIL Take 25 mg by mouth daily.   HYDROcodone-acetaminophen 5-325 MG tablet Commonly known as: NORCO/VICODIN Take 1-2 tablets by mouth every 6 (six) hours as needed for severe pain. Notes to patient: Last dose given 06/08 10:03am   hydroxychloroquine 200 MG tablet Commonly known as: PLAQUENIL Take 400 mg by mouth daily.   lisinopril 10 MG tablet Commonly known as: ZESTRIL TAKE 1 TABLET BY MOUTH EVERY DAY Notes to patient: Resume home regimen   methocarbamol 500 MG tablet Commonly known as: ROBAXIN Take 1 tablet (500 mg total) by mouth every 6 (six) hours as needed for muscle spasms. Notes to patient: Last dose given 06/07 02:27pm   OVER THE COUNTER MEDICATION Place 1 drop into both eyes at bedtime as needed (Gritty eyes). Resolve Eye drops Notes to patient: Resume home regimen   rivaroxaban 10 MG Tabs tablet Commonly known as: XARELTO Take 1 tablet (10 mg total) by mouth daily with breakfast for 20 days. Then take an 81 mg aspirin once a day for three weeks. Then discontinue.   traMADol 50 MG tablet Commonly known as: ULTRAM Take 1-2 tablets (50-100 mg total) by mouth every 6 (six) hours as needed for moderate pain. Notes to patient: Last dose given 06/07 11:51am               Discharge Care Instructions  (From admission, onward)           Start     Ordered   01/01/22 0000  Weight bearing as tolerated        06' /08/23 0736   01/01/22 0000  Change dressing       Comments: You have  an adhesive waterproof bandage over the incision. Leave  this in place until your first follow-up appointment. Once you remove this you will not need to place another bandage.   01/01/22 0736            Follow-up Information     Gaynelle Arabian, MD. Schedule an appointment as soon as possible for a visit in 2 week(s).   Specialty: Orthopedic Surgery Contact information: [address] [place] [address] [phone]                 Signed: R. Jaynie Bream, PA-C Orthopedic Surgery 01/02/2022, 6:40 PM

## 2022-01-20 ENCOUNTER — Encounter (HOSPITAL_COMMUNITY): Payer: Self-pay | Admitting: *Deleted

## 2022-01-20 ENCOUNTER — Inpatient Hospital Stay (HOSPITAL_COMMUNITY)
Admission: EM | Admit: 2022-01-20 | Discharge: 2022-01-22 | DRG: 378 | Disposition: A | Discharge status: Home / Self Care | Payer: Medicare Other | Attending: Family Medicine | Admitting: Family Medicine

## 2022-01-20 ENCOUNTER — Other Ambulatory Visit: Payer: Self-pay

## 2022-01-20 ENCOUNTER — Other Ambulatory Visit (HOSPITAL_COMMUNITY): Payer: Medicare Other

## 2022-01-20 DIAGNOSIS — D649 Anemia, unspecified: Secondary | ICD-10-CM | POA: Diagnosis present

## 2022-01-20 DIAGNOSIS — J441 Chronic obstructive pulmonary disease with (acute) exacerbation: Secondary | ICD-10-CM

## 2022-01-20 DIAGNOSIS — R0981 Nasal congestion: Secondary | ICD-10-CM

## 2022-01-20 DIAGNOSIS — K922 Gastrointestinal hemorrhage, unspecified: Secondary | ICD-10-CM

## 2022-01-20 DIAGNOSIS — T45515A Adverse effect of anticoagulants, initial encounter: Secondary | ICD-10-CM | POA: Diagnosis present

## 2022-01-20 DIAGNOSIS — Z888 Allergy status to other drugs, medicaments and biological substances status: Secondary | ICD-10-CM

## 2022-01-20 DIAGNOSIS — Z79899 Other long term (current) drug therapy: Secondary | ICD-10-CM

## 2022-01-20 DIAGNOSIS — Z7951 Long term (current) use of inhaled steroids: Secondary | ICD-10-CM

## 2022-01-20 DIAGNOSIS — Z85828 Personal history of other malignant neoplasm of skin: Secondary | ICD-10-CM

## 2022-01-20 DIAGNOSIS — Z96642 Presence of left artificial hip joint: Secondary | ICD-10-CM | POA: Diagnosis not present

## 2022-01-20 DIAGNOSIS — K219 Gastro-esophageal reflux disease without esophagitis: Secondary | ICD-10-CM | POA: Diagnosis present

## 2022-01-20 DIAGNOSIS — Z96611 Presence of right artificial shoulder joint: Secondary | ICD-10-CM | POA: Diagnosis present

## 2022-01-20 DIAGNOSIS — Z96651 Presence of right artificial knee joint: Secondary | ICD-10-CM | POA: Diagnosis present

## 2022-01-20 DIAGNOSIS — K254 Chronic or unspecified gastric ulcer with hemorrhage: Principal | ICD-10-CM | POA: Diagnosis present

## 2022-01-20 DIAGNOSIS — D6832 Hemorrhagic disorder due to extrinsic circulating anticoagulants: Secondary | ICD-10-CM | POA: Diagnosis present

## 2022-01-20 DIAGNOSIS — Z801 Family history of malignant neoplasm of trachea, bronchus and lung: Secondary | ICD-10-CM

## 2022-01-20 DIAGNOSIS — K449 Diaphragmatic hernia without obstruction or gangrene: Secondary | ICD-10-CM | POA: Diagnosis present

## 2022-01-20 DIAGNOSIS — Z96662 Presence of left artificial ankle joint: Secondary | ICD-10-CM | POA: Diagnosis present

## 2022-01-20 DIAGNOSIS — Z881 Allergy status to other antibiotic agents status: Secondary | ICD-10-CM

## 2022-01-20 DIAGNOSIS — R7303 Prediabetes: Secondary | ICD-10-CM | POA: Diagnosis present

## 2022-01-20 DIAGNOSIS — K5730 Diverticulosis of large intestine without perforation or abscess without bleeding: Secondary | ICD-10-CM | POA: Diagnosis present

## 2022-01-20 DIAGNOSIS — Z820 Family history of epilepsy and other diseases of the nervous system: Secondary | ICD-10-CM

## 2022-01-20 DIAGNOSIS — M1990 Unspecified osteoarthritis, unspecified site: Secondary | ICD-10-CM | POA: Diagnosis present

## 2022-01-20 DIAGNOSIS — I10 Essential (primary) hypertension: Secondary | ICD-10-CM

## 2022-01-20 DIAGNOSIS — M059 Rheumatoid arthritis with rheumatoid factor, unspecified: Secondary | ICD-10-CM

## 2022-01-20 DIAGNOSIS — K920 Hematemesis: Secondary | ICD-10-CM | POA: Diagnosis not present

## 2022-01-20 DIAGNOSIS — K648 Other hemorrhoids: Secondary | ICD-10-CM | POA: Diagnosis present

## 2022-01-20 DIAGNOSIS — Z87891 Personal history of nicotine dependence: Secondary | ICD-10-CM

## 2022-01-20 DIAGNOSIS — K2900 Acute gastritis without bleeding: Secondary | ICD-10-CM | POA: Diagnosis present

## 2022-01-20 DIAGNOSIS — K625 Hemorrhage of anus and rectum: Principal | ICD-10-CM

## 2022-01-20 DIAGNOSIS — T39395A Adverse effect of other nonsteroidal anti-inflammatory drugs [NSAID], initial encounter: Secondary | ICD-10-CM | POA: Diagnosis present

## 2022-01-20 DIAGNOSIS — D62 Acute posthemorrhagic anemia: Secondary | ICD-10-CM | POA: Diagnosis present

## 2022-01-20 DIAGNOSIS — R051 Acute cough: Secondary | ICD-10-CM

## 2022-01-20 DIAGNOSIS — Z800 Family history of malignant neoplasm of digestive organs: Secondary | ICD-10-CM

## 2022-01-20 LAB — COMPREHENSIVE METABOLIC PANEL
ALT: 21 U/L (ref 0–44)
AST: 21 U/L (ref 15–41)
Albumin: 2.8 g/dL — ABNORMAL LOW (ref 3.5–5.0)
Alkaline Phosphatase: 91 U/L (ref 38–126)
Anion gap: 11 (ref 5–15)
BUN: 47 mg/dL — ABNORMAL HIGH (ref 8–23)
CO2: 25 mmol/L (ref 22–32)
Calcium: 9.1 mg/dL (ref 8.9–10.3)
Chloride: 103 mmol/L (ref 98–111)
Creatinine, Ser: 0.91 mg/dL (ref 0.44–1.00)
GFR, Estimated: >60 mL/min (ref >60)
Glucose, Bld: 112 mg/dL — ABNORMAL HIGH (ref 70–99)
Potassium: 4.4 mmol/L (ref 3.5–5.1)
Sodium: 139 mmol/L (ref 135–145)
Total Bilirubin: 0.5 mg/dL (ref 0.3–1.2)
Total Protein: 6.6 g/dL (ref 6.5–8.1)

## 2022-01-20 LAB — CBC
HCT: 29.3 % — ABNORMAL LOW (ref 36.0–46.0)
HCT: 27.4 % — ABNORMAL LOW (ref 36.0–46.0)
Hemoglobin: 9.2 g/dL — ABNORMAL LOW (ref 12.0–15.0)
Hemoglobin: 8.7 g/dL — ABNORMAL LOW (ref 12.0–15.0)
MCH: 30.9 pg (ref 26.0–34.0)
MCH: 31.4 pg (ref 26.0–34.0)
MCHC: 31.4 g/dL (ref 30.0–36.0)
MCHC: 31.8 g/dL (ref 30.0–36.0)
MCV: 98.3 fL (ref 80.0–100.0)
MCV: 98.9 fL (ref 80.0–100.0)
Platelets: 339 10*3/uL (ref 150–400)
Platelets: 427 10*3/uL — ABNORMAL HIGH (ref 150–400)
RBC: 2.98 MIL/uL — ABNORMAL LOW (ref 3.87–5.11)
RBC: 2.77 MIL/uL — ABNORMAL LOW (ref 3.87–5.11)
RDW: 11.9 % (ref 11.5–15.5)
RDW: 11.9 % (ref 11.5–15.5)
WBC: 8.6 10*3/uL (ref 4.0–10.5)
WBC: 9.2 10*3/uL (ref 4.0–10.5)
nRBC: 0 % (ref 0.0–0.2)
nRBC: 0 % (ref 0.0–0.2)

## 2022-01-20 LAB — PROTIME-INR
INR: 1.3 — ABNORMAL HIGH (ref 0.8–1.2)
Prothrombin Time: 15.9 seconds — ABNORMAL HIGH (ref 11.4–15.2)

## 2022-01-20 LAB — MRSA NEXT GEN BY PCR, NASAL: MRSA by PCR Next Gen: NOT DETECTED

## 2022-01-20 LAB — POC OCCULT BLOOD, ED: Fecal Occult Bld: POSITIVE — AB

## 2022-01-20 MED ORDER — PANTOPRAZOLE SODIUM 40 MG IV SOLR
40.0000 mg | INTRAVENOUS | Status: AC
  Administered 2022-01-20: 40 mg via INTRAVENOUS
  Filled 2022-01-20: qty 10

## 2022-01-20 MED ORDER — ACETAMINOPHEN 650 MG RE SUPP: 650.0000 mg | Freq: Four times a day (QID) | RECTAL | Status: DC | PRN

## 2022-01-20 MED ORDER — SODIUM CHLORIDE 0.9 % IV BOLUS
500.0000 mL | Freq: Once | INTRAVENOUS | Status: AC
  Administered 2022-01-20: 500 mL via INTRAVENOUS

## 2022-01-20 MED ORDER — METOCLOPRAMIDE HCL 5 MG/ML IJ SOLN
10.0000 mg | Freq: Once | INTRAMUSCULAR | Status: AC
Start: 2022-01-20 — End: 2022-01-20
  Administered 2022-01-20: 10 mg via INTRAVENOUS
  Filled 2022-01-20: qty 2

## 2022-01-20 MED ORDER — PEG 3350-KCL-NA BICARB-NACL 420 G PO SOLR
4000.0000 mL | Freq: Once | ORAL | Status: AC
  Administered 2022-01-20: 4000 mL via ORAL

## 2022-01-20 MED ORDER — ONDANSETRON HCL 4 MG/2ML IJ SOLN
4.0000 mg | Freq: Four times a day (QID) | INTRAMUSCULAR | Status: DC | PRN
  Administered 2022-01-20: 4 mg via INTRAVENOUS
  Filled 2022-01-20: qty 2

## 2022-01-20 MED ORDER — PANTOPRAZOLE SODIUM 40 MG IV SOLR
40.0000 mg | Freq: Two times a day (BID) | INTRAVENOUS | Status: DC
Start: 2022-01-21 — End: 2022-01-22
  Administered 2022-01-21 – 2022-01-22 (×3): 40 mg via INTRAVENOUS
  Filled 2022-01-20 (×3): qty 10

## 2022-01-20 MED ORDER — LACTATED RINGERS IV SOLN: INTRAVENOUS | Status: AC

## 2022-01-20 MED ORDER — CHLORHEXIDINE GLUCONATE CLOTH 2 % EX PADS
6.0000 | MEDICATED_PAD | Freq: Every day | CUTANEOUS | Status: DC
  Administered 2022-01-20: 6 via TOPICAL

## 2022-01-20 MED ORDER — POLYETHYLENE GLYCOL 3350 17 G PO PACK: 17.0000 g | PACK | Freq: Every day | ORAL | Status: DC | PRN

## 2022-01-20 MED ORDER — ACETAMINOPHEN 325 MG PO TABS: 650.0000 mg | ORAL_TABLET | Freq: Four times a day (QID) | ORAL | Status: DC | PRN

## 2022-01-20 MED ORDER — MORPHINE SULFATE (PF) 2 MG/ML IV SOLN
2.0000 mg | INTRAVENOUS | Status: DC | PRN
  Administered 2022-01-20 – 2022-01-21 (×2): 2 mg via INTRAVENOUS
  Filled 2022-01-20 (×2): qty 1

## 2022-01-20 MED ORDER — HYDROXYCHLOROQUINE SULFATE 200 MG PO TABS
400.0000 mg | ORAL_TABLET | Freq: Every day | ORAL | Status: DC
  Administered 2022-01-22: 400 mg via ORAL
  Filled 2022-01-20 (×5): qty 2

## 2022-01-20 NOTE — Assessment & Plan Note (Addendum)
Several episodes of hematemesis and hematochezia.  Reported dizziness, blood pressure here 112-130 systolic, EMS reports blood pressure of 90/50.  Hemoglobin down 2 points at 9.2 from baseline 11-13.  No history of GI blood loss.  On Xarelto for DVT prophylaxis for recent left hip arthroplasty. - EDP consulted GI, evaluated by Dr. Levon Moss -IV Protonix 40 twice daily -Possible EGD and colonoscopy tomorrow -CBC Q8 hourly -Clear liquid diet, n.p.o. midnight -Hold Xarelto - 500 mill bolus given, continue L/R 100cc/hr x 15hrs

## 2022-01-20 NOTE — ED Provider Notes (Signed)
[facility] EMERGENCY DEPARTMENT Provider Note   CSN: [id number] Arrival date & time: 01/20/22  1316     History  Chief Complaint  Patient presents with   Hematemesis    NOLEEN BULICK is a 86 y.o. female.  HPI  This patient is an 86 year old female who presents with rectal bleeding.  The patient had last been in the hospital approximately 2-1/2 weeks ago when she had an operation for a hip surgery on the left side.  She had a hip replacement and was initially placed on aspirin as her insurance would not play for Xarelto, ultimately she was able to get samples from her family doctor and has been on the Xarelto for a couple of weeks.  Yesterday last night the patient developed acute onset of bright red blood per rectum, she had another 2 episodes today and then started to vomit black material.  The patient reports having prior colonoscopy by Dr. Matthias Hughs but we do not have those results, she was never told that she had diverticulosis but is unaware.  She has never had stomach ulcers and takes nothing for her stomach.  She does take NSAIDs rather frequently recently taking ibuprofen multiple times per day, she is not on aspirin.  Today she has felt dizzy  Home Medications Prior to Admission medications   Medication Sig Start Date End Date Taking? Authorizing Provider  Albuterol Sulfate (PROAIR RESPICLICK) 108 (90 Base) MCG/ACT AEPB Inhale 1-2 puffs into the lungs every 6 (six) hours as needed (shortness of breath, wheezing). Patient not taking: Reported on 12/16/2021 02/03/21   Gabriel Earing, FNP  Biotin w/ Vitamins C & E (HAIR/SKIN/NAILS PO) Take 1 tablet by mouth daily.    [provider]  Budeson-Glycopyrrol-Formoterol (BREZTRI AEROSPHERE) 160-9-4.8 MCG/ACT AERO Inhale 2 puffs into the lungs 2 (two) times daily. 10/08/21   Dettinger, Elige Radon, MD  clobetasol cream (TEMOVATE) 0.05 % Apply 1 application topically 2 (two) times daily as needed (vaginal irritation.). 03/30/19    [provider]  D3-50 1.25 MG (50000 UT) capsule Take 50,000 Units by mouth every Sunday. 10/23/19   [provider]  hydrochlorothiazide (HYDRODIURIL) 25 MG tablet Take 25 mg by mouth daily. 11/14/21   [provider]  HYDROcodone-acetaminophen (NORCO/VICODIN) 5-325 MG tablet Take 1-2 tablets by mouth every 6 (six) hours as needed for severe pain. 01/01/22   Eartha Inch, PA  hydroxychloroquine (PLAQUENIL) 200 MG tablet Take 400 mg by mouth daily. 11/25/21   [provider]  lisinopril (ZESTRIL) 10 MG tablet TAKE 1 TABLET BY MOUTH EVERY DAY 04/01/21   Jannifer Rodney A, FNP  methocarbamol (ROBAXIN) 500 MG tablet Take 1 tablet (500 mg total) by mouth every 6 (six) hours as needed for muscle spasms. 01/01/22   Eartha Inch, PA  OVER THE COUNTER MEDICATION Place 1 drop into both eyes at bedtime as needed (Gritty eyes). Resolve Eye drops    [provider]  rivaroxaban (XARELTO) 10 MG TABS tablet Take 1 tablet (10 mg total) by mouth daily with breakfast for 20 days. Then take an 81 mg aspirin once a day for three weeks. Then discontinue. 01/01/22 01/21/22  Eartha Inch, PA  traMADol (ULTRAM) 50 MG tablet Take 1-2 tablets (50-100 mg total) by mouth every 6 (six) hours as needed for moderate pain. 01/01/22   Eartha Inch, PA      Allergies    Statins, Neosporin [neomycin-bacitracin zn-polymyx], and Polysporin [bacitracin-polymyxin b]    Review of Systems   Review of  Systems  All other systems reviewed and are negative.   Physical Exam Updated Vital Signs BP (!) 121/50   Pulse 94   Resp 17   Ht 1.727 m (5\' 8" )   Wt 90.7 kg   SpO2 100%   BMI 30.41 kg/m  Physical Exam Vitals and nursing note reviewed.  Constitutional:      General: She is not in acute distress.    Appearance: She is well-developed.  HENT:     Head: Normocephalic and atraumatic.     Mouth/Throat:     Pharynx: No oropharyngeal exudate.  Eyes:     General: No scleral  icterus.       Right eye: No discharge.        Left eye: No discharge.     Conjunctiva/sclera: Conjunctivae normal.     Pupils: Pupils are equal, round, and reactive to light.  Neck:     Thyroid: No thyromegaly.     Vascular: No JVD.  Cardiovascular:     Rate and Rhythm: Normal rate and regular rhythm.     Heart sounds: Normal heart sounds. No murmur heard.    No friction rub. No gallop.  Pulmonary:     Effort: Pulmonary effort is normal. No respiratory distress.     Breath sounds: Normal breath sounds. No wheezing or rales.  Abdominal:     General: Bowel sounds are normal. There is no distension.     Palpations: Abdomen is soft. There is no mass.     Tenderness: There is no abdominal tenderness.     Comments: Totally nontender abdomen  Genitourinary:    Comments: Bright red blood Musculoskeletal:        General: No tenderness. Normal range of motion.     Cervical back: Normal range of motion and neck supple.     Comments: No edema of the legs  Lymphadenopathy:     Cervical: No cervical adenopathy.  Skin:    General: Skin is warm and dry.     Findings: No erythema or rash.  Neurological:     Mental Status: She is alert.     Coordination: Coordination normal.  Psychiatric:        Behavior: Behavior normal.     ED Results / Procedures / Treatments   Labs (all labs ordered are listed, but only abnormal results are displayed) Labs Reviewed  COMPREHENSIVE METABOLIC PANEL - Abnormal; Notable for the following components:      Result Value   Glucose, Bld 112 (*)    BUN 47 (*)    Albumin 2.8 (*)    All other components within normal limits  CBC - Abnormal; Notable for the following components:   RBC 2.98 (*)    Hemoglobin 9.2 (*)    HCT 29.3 (*)    All other components within normal limits  PROTIME-INR - Abnormal; Notable for the following components:   Prothrombin Time 15.9 (*)    INR 1.3 (*)    All other components within normal limits  POC OCCULT BLOOD, ED -  Abnormal; Notable for the following components:   Fecal Occult Bld POSITIVE (*)    All other components within normal limits  TYPE AND SCREEN    EKG None  Radiology No results found.  Procedures Procedures    Medications Ordered in ED Medications  sodium chloride 0.9 % bolus 500 mL (500 mLs Intravenous New Bag/Given 01/20/22 1445)    ED Course/ Medical Decision Making/ A&P  Medical Decision Making Amount and/or Complexity of Data Reviewed Labs: ordered.  Risk Prescription drug management. Decision regarding hospitalization.   This patient presents to the ED for concern of GI bleeding, this involves an extensive number of treatment options, and is a complaint that carries with it a high risk of complications and morbidity.  The differential diagnosis includes upper and lower GI bleeding however lower GI bleed seems consistent with multiple bright red blood bowel movements.  I do not have a prior colonoscopy to compare to.  She has had some black emesis so there would be some concern for stomach ulcers and given that she has been on NSAIDs recently that would not be surprising either   Co morbidities that complicate the patient evaluation  Recent surgery, on Xarelto, last dose yesterday morning   Additional history obtained:  Additional history obtained from electronic medical record External records from outside source obtained and reviewed including prior hospitalization from about 3 weeks ago where she had a hip replacement   Lab Tests:  I Ordered, and personally interpreted labs.  The pertinent results include: CBC showing a decrease in her hemoglobin by about 2 g over the last couple of weeks   Cardiac Monitoring: / EKG:  The patient was maintained on a cardiac monitor.  I personally viewed and interpreted the cardiac monitored which showed an underlying rhythm of: Normal sinus rhythm, borderline tachycardia at 95 to 100  bpm   Consultations Obtained:  I requested consultation with the gastroenterologist and hospitalist,  and discussed lab and imaging findings as well as pertinent plan - they recommend: Admission to the hospital for trending of hemoglobins   Problem List / ED Course / Critical interventions / Medication management  Ongoing GI bleeding, patient appears hemodynamically stable but actively bleeding will need admission to the hospital and likely to stop Xarelto as this is a preventative and not treatment for any DVT I ordered medication including Protonix for possible upper GI bleed Reevaluation of the patient after these medicines showed that the patient improved I have reviewed the patients home medicines and have made adjustments as needed   Social Determinants of Health:  Recent surgery, on Xarelto   Test / Admission - Considered:  Considered and will admit to the hospital Recommendations from GI includes Reglan, Protonix twice daily, clear liquids to midnight, n.p.o. for studies tomorrow         Final Clinical Impression(s) / ED Diagnoses Final diagnoses:  Rectal bleeding  Anemia associated with acute blood loss    Rx / DC Orders ED Discharge Orders     None         Eber Hong, MD 01/20/22 1627

## 2022-01-20 NOTE — Assessment & Plan Note (Signed)
 Resume Plaquenil

## 2022-01-20 NOTE — ED Notes (Signed)
Dr Levon Hedger at bedside discussing plan of care

## 2022-01-20 NOTE — ED Notes (Signed)
Hemecult stool collected= positive

## 2022-01-20 NOTE — Assessment & Plan Note (Addendum)
Done 6/7 by Dr. Lequita Halt, for advanced end-stage left hip osteoarthritis.  Placed on Xarelto for DVT prophylaxis.  Surgical site looks clean, well opposed without evidence of infection, almost completely healed -Hold Xarelto for now in setting of GI bleed. -Morphine 2 mg q4h as needed for pain

## 2022-01-20 NOTE — ED Triage Notes (Signed)
Pt brought in by [facility] for c/o vomiting blood; pt was started on xarelto x 1.5 weeks ago  Pt reports blood in stool x one week that has progressively gotten worse last night   Pt vomiting coffee ground emesis per granddaughter  Pt had hip replacement surgery x 3 weeks ago  BP with ems was 90/50; ems administered of NS and BP up to 102 systolic

## 2022-01-21 ENCOUNTER — Inpatient Hospital Stay (HOSPITAL_COMMUNITY): Payer: Medicare Other | Admitting: Certified Registered Nurse Anesthetist

## 2022-01-21 ENCOUNTER — Encounter (HOSPITAL_COMMUNITY)
Admission: EM | Disposition: A | Discharge status: Home / Self Care | Payer: Self-pay | Source: Home / Self Care | Attending: Family Medicine

## 2022-01-21 ENCOUNTER — Encounter (HOSPITAL_COMMUNITY): Payer: Self-pay | Admitting: Family Medicine

## 2022-01-21 ENCOUNTER — Other Ambulatory Visit: Payer: Self-pay

## 2022-01-21 DIAGNOSIS — K620 Anal polyp: Secondary | ICD-10-CM

## 2022-01-21 DIAGNOSIS — K259 Gastric ulcer, unspecified as acute or chronic, without hemorrhage or perforation: Secondary | ICD-10-CM | POA: Diagnosis not present

## 2022-01-21 DIAGNOSIS — K3189 Other diseases of stomach and duodenum: Secondary | ICD-10-CM

## 2022-01-21 DIAGNOSIS — Z79899 Other long term (current) drug therapy: Secondary | ICD-10-CM | POA: Diagnosis not present

## 2022-01-21 DIAGNOSIS — T39395A Adverse effect of other nonsteroidal anti-inflammatory drugs [NSAID], initial encounter: Secondary | ICD-10-CM | POA: Diagnosis present

## 2022-01-21 DIAGNOSIS — D6832 Hemorrhagic disorder due to extrinsic circulating anticoagulants: Secondary | ICD-10-CM | POA: Diagnosis present

## 2022-01-21 DIAGNOSIS — K2900 Acute gastritis without bleeding: Secondary | ICD-10-CM | POA: Diagnosis present

## 2022-01-21 DIAGNOSIS — K920 Hematemesis: Secondary | ICD-10-CM

## 2022-01-21 DIAGNOSIS — D62 Acute posthemorrhagic anemia: Secondary | ICD-10-CM | POA: Diagnosis present

## 2022-01-21 DIAGNOSIS — D649 Anemia, unspecified: Secondary | ICD-10-CM | POA: Diagnosis present

## 2022-01-21 DIAGNOSIS — K648 Other hemorrhoids: Secondary | ICD-10-CM | POA: Diagnosis present

## 2022-01-21 DIAGNOSIS — T45515A Adverse effect of anticoagulants, initial encounter: Secondary | ICD-10-CM | POA: Diagnosis present

## 2022-01-21 DIAGNOSIS — K5731 Diverticulosis of large intestine without perforation or abscess with bleeding: Secondary | ICD-10-CM | POA: Diagnosis not present

## 2022-01-21 DIAGNOSIS — M1990 Unspecified osteoarthritis, unspecified site: Secondary | ICD-10-CM | POA: Diagnosis present

## 2022-01-21 DIAGNOSIS — Z96651 Presence of right artificial knee joint: Secondary | ICD-10-CM | POA: Diagnosis present

## 2022-01-21 DIAGNOSIS — K254 Chronic or unspecified gastric ulcer with hemorrhage: Secondary | ICD-10-CM | POA: Diagnosis present

## 2022-01-21 DIAGNOSIS — Z96662 Presence of left artificial ankle joint: Secondary | ICD-10-CM | POA: Diagnosis present

## 2022-01-21 DIAGNOSIS — K5730 Diverticulosis of large intestine without perforation or abscess without bleeding: Secondary | ICD-10-CM | POA: Diagnosis present

## 2022-01-21 DIAGNOSIS — Z888 Allergy status to other drugs, medicaments and biological substances status: Secondary | ICD-10-CM | POA: Diagnosis not present

## 2022-01-21 DIAGNOSIS — R7303 Prediabetes: Secondary | ICD-10-CM | POA: Diagnosis present

## 2022-01-21 DIAGNOSIS — I10 Essential (primary) hypertension: Secondary | ICD-10-CM | POA: Diagnosis present

## 2022-01-21 DIAGNOSIS — Z96611 Presence of right artificial shoulder joint: Secondary | ICD-10-CM | POA: Diagnosis present

## 2022-01-21 DIAGNOSIS — K625 Hemorrhage of anus and rectum: Secondary | ICD-10-CM | POA: Diagnosis not present

## 2022-01-21 DIAGNOSIS — M059 Rheumatoid arthritis with rheumatoid factor, unspecified: Secondary | ICD-10-CM | POA: Diagnosis present

## 2022-01-21 DIAGNOSIS — Z881 Allergy status to other antibiotic agents status: Secondary | ICD-10-CM | POA: Diagnosis not present

## 2022-01-21 DIAGNOSIS — K449 Diaphragmatic hernia without obstruction or gangrene: Secondary | ICD-10-CM

## 2022-01-21 DIAGNOSIS — Z87891 Personal history of nicotine dependence: Secondary | ICD-10-CM | POA: Diagnosis not present

## 2022-01-21 DIAGNOSIS — Z85828 Personal history of other malignant neoplasm of skin: Secondary | ICD-10-CM | POA: Diagnosis not present

## 2022-01-21 DIAGNOSIS — Z7951 Long term (current) use of inhaled steroids: Secondary | ICD-10-CM | POA: Diagnosis not present

## 2022-01-21 DIAGNOSIS — Z96642 Presence of left artificial hip joint: Secondary | ICD-10-CM | POA: Diagnosis present

## 2022-01-21 DIAGNOSIS — K219 Gastro-esophageal reflux disease without esophagitis: Secondary | ICD-10-CM | POA: Diagnosis present

## 2022-01-21 HISTORY — PX: BIOPSY: SHX5522

## 2022-01-21 HISTORY — PX: COLONOSCOPY WITH PROPOFOL: SHX5780

## 2022-01-21 HISTORY — PX: ESOPHAGOGASTRODUODENOSCOPY (EGD) WITH PROPOFOL: SHX5813

## 2022-01-21 LAB — CBC
HCT: 23.3 % — ABNORMAL LOW (ref 36.0–46.0)
Hemoglobin: 7.5 g/dL — ABNORMAL LOW (ref 12.0–15.0)
MCH: 31.3 pg (ref 26.0–34.0)
MCHC: 32.2 g/dL (ref 30.0–36.0)
MCV: 97.1 fL (ref 80.0–100.0)
Platelets: 346 10*3/uL (ref 150–400)
RBC: 2.4 MIL/uL — ABNORMAL LOW (ref 3.87–5.11)
RDW: 12 % (ref 11.5–15.5)
WBC: 8.6 10*3/uL (ref 4.0–10.5)
nRBC: 0 % (ref 0.0–0.2)

## 2022-01-21 LAB — GLUCOSE, CAPILLARY
Glucose-Capillary: 84 mg/dL (ref 70–99)
Glucose-Capillary: 86 mg/dL (ref 70–99)
Glucose-Capillary: 88 mg/dL (ref 70–99)
Glucose-Capillary: 95 mg/dL (ref 70–99)

## 2022-01-21 LAB — PREPARE RBC (CROSSMATCH)

## 2022-01-21 LAB — ABO/RH: ABO/RH(D): A POS

## 2022-01-21 SURGERY — ESOPHAGOGASTRODUODENOSCOPY (EGD) WITH PROPOFOL
Anesthesia: General

## 2022-01-21 MED ORDER — SODIUM CHLORIDE 0.9% IV SOLUTION: Freq: Once | INTRAVENOUS | Status: AC

## 2022-01-21 MED ORDER — FENTANYL CITRATE (PF) 100 MCG/2ML IJ SOLN: 50.0000 ug | INTRAMUSCULAR | Status: DC | PRN

## 2022-01-21 MED ORDER — PROPOFOL 500 MG/50ML IV EMUL
INTRAVENOUS | Status: AC
  Filled 2022-01-21: qty 50

## 2022-01-21 MED ORDER — LIDOCAINE 2% (20 MG/ML) 5 ML SYRINGE
INTRAMUSCULAR | Status: DC | PRN
  Administered 2022-01-21: 50 mg via INTRAVENOUS

## 2022-01-21 MED ORDER — LACTATED RINGERS IV SOLN: INTRAVENOUS | Status: DC

## 2022-01-21 MED ORDER — FENTANYL CITRATE PF 50 MCG/ML IJ SOSY
PREFILLED_SYRINGE | INTRAMUSCULAR | Status: AC
  Administered 2022-01-21: 50 ug
  Filled 2022-01-21: qty 1

## 2022-01-21 MED ORDER — FUROSEMIDE 10 MG/ML IJ SOLN
20.0000 mg | Freq: Once | INTRAMUSCULAR | Status: AC
Start: 2022-01-21 — End: 2022-01-21
  Administered 2022-01-21: 20 mg via INTRAVENOUS
  Filled 2022-01-21: qty 2

## 2022-01-21 MED ORDER — PROPOFOL 500 MG/50ML IV EMUL
INTRAVENOUS | Status: DC | PRN
  Administered 2022-01-21: 150 ug/kg/min via INTRAVENOUS

## 2022-01-21 MED ORDER — PROPOFOL 10 MG/ML IV BOLUS
INTRAVENOUS | Status: DC | PRN
  Administered 2022-01-21: 80 mg via INTRAVENOUS

## 2022-01-21 MED ORDER — CHLORHEXIDINE GLUCONATE CLOTH 2 % EX PADS
6.0000 | MEDICATED_PAD | Freq: Every day | CUTANEOUS | Status: DC
  Administered 2022-01-21 – 2022-01-22 (×2): 6 via TOPICAL

## 2022-01-21 MED ORDER — SODIUM CHLORIDE 0.9 % IV SOLN: INTRAVENOUS | Status: DC

## 2022-01-21 NOTE — Op Note (Signed)
[facility] Patient Name: Mary Moss Procedure Date: 01/21/2022 3:13 PM MRN: [id number] Date of Birth: April 08, 1936 Attending MD: Maylon Peppers ,  CSN: [id number] Age: 86 Admit Type: Outpatient Procedure:                Upper GI endoscopy Indications:              Coffee-ground emesis Providers:                Maylon Peppers, Everardo Pacific, [place]                            Risa Grill, Technician Referring MD:              Medicines:                Monitored Anesthesia Care Complications:            No immediate complications. Estimated Blood Loss:     Estimated blood loss: none. Procedure:                Pre-Anesthesia Assessment:                           - Prior to the procedure, a History and Physical                            was performed, and patient medications, allergies                            and sensitivities were reviewed. The patient's                            tolerance of previous anesthesia was reviewed.                           - The risks and benefits of the procedure and the                            sedation options and risks were discussed with the                            patient. All questions were answered and informed                            consent was obtained.                           - ASA Grade Assessment: II - A patient with mild                            systemic disease.                           After obtaining informed consent, the endoscope was                            passed under direct vision. Throughout the  procedure, the patient's blood pressure, pulse, and                            oxygen saturations were monitored continuously. The                            GIF-H190 ([id number]) scope was introduced through the                            mouth, and advanced to the second part of duodenum.                            The upper GI endoscopy was accomplished without                             difficulty. The patient tolerated the procedure                            well. Scope In: 3:21:10 PM Scope Out: 3:27:42 PM Total Procedure Duration: 0 hours 6 minutes 32 seconds  Findings:      A 1 cm hiatal hernia was present.      The gastroesophageal flap valve was visualized endoscopically and       classified as Hill Grade II (fold present, opens with respiration).      Two non-bleeding cratered gastric ulcers with a clean ulcer base       (Forrest Class III) were found in the gastric antrum. The largest lesion       was 10 mm in largest dimension. Biopsies from ulcer edge, antrum and       body were taken with a cold forceps for histology and H. pylori.      Patchy mildly erythematous mucosa without active bleeding was found in       the first portion of the duodenum. Impression:               - 1 cm hiatal hernia.                           - Gastroesophageal flap valve classified as Hill                            Grade II (fold present, opens with respiration).                           - Non-bleeding gastric ulcers with a clean ulcer                            base (Forrest Class III). Biopsied.                           - Erythematous duodenopathy. Moderate Sedation:      Per Anesthesia Care Recommendation:           - Return patient to hospital ward                           - Resume previous  diet.                           - Await pathology results.                           - Continue pantoprazole 40 mg PO BID for at least 2                            months.                           - Check H. pylori IgG.                           - No ibuprofen, naproxen, or other non-steroidal                            anti-inflammatory drugs.                           - Repeat upper endoscopy in 2 months for                            surveillance. Procedure Code(s):        --- Professional ---                           [phone], Esophagogastroduodenoscopy, flexible,                             transoral; with biopsy, single or multiple Diagnosis Code(s):        --- Professional ---                           K44.9, Diaphragmatic hernia without obstruction or                            gangrene                           K25.9, Gastric ulcer, unspecified as acute or                            chronic, without hemorrhage or perforation                           K31.89, Other diseases of stomach and duodenum                           K92.0, Hematemesis CPT copyright 2019 [employer]. All rights reserved. The codes documented in this report are preliminary and upon coder review may  be revised to meet current compliance requirements. Maylon Peppers, MD Maylon Peppers,  01/21/2022 4:00:42 PM This report has been signed electronically. Number of Addenda: 0

## 2022-01-21 NOTE — Anesthesia Postprocedure Evaluation (Signed)
Anesthesia Post Note  Patient: [name]  Procedure(s) Performed: ESOPHAGOGASTRODUODENOSCOPY (EGD) WITH PROPOFOL COLONOSCOPY WITH PROPOFOL BIOPSY  Patient location during evaluation: Phase II Anesthesia Type: General Level of consciousness: awake Pain management: pain level controlled Vital Signs Assessment: post-procedure vital signs reviewed and stable Respiratory status: spontaneous breathing and respiratory function stable Cardiovascular status: blood pressure returned to baseline and stable Postop Assessment: no headache and no apparent nausea or vomiting Anesthetic complications: no Comments: Late entry   No notable events documented.   Last Vitals:  Vitals:   01/21/22 1229 01/21/22 1357  BP: (!) 114/40 (!) 135/57  Pulse: 91 96  Resp: (!) 23 16  Temp: 37.1 C 37 C  SpO2: 92% 97%    Last Pain:  Vitals:   01/21/22 1500  TempSrc:   PainSc: 10-Worst pain ever                 Louann Sjogren

## 2022-01-21 NOTE — Anesthesia Preprocedure Evaluation (Signed)
Anesthesia Evaluation  Patient identified by MRN, date of birth, ID band Patient awake    Reviewed: Allergy & Precautions, H&P , NPO status , Patient's Chart, lab work & pertinent test results, reviewed documented beta blocker date and time   Airway Mallampati: II  TM Distance: >3 FB Neck ROM: full    Dental no notable dental hx.    Pulmonary neg pulmonary ROS, former smoker,    Pulmonary exam normal breath sounds clear to auscultation       Cardiovascular Exercise Tolerance: Good hypertension, negative cardio ROS   Rhythm:regular Rate:Normal     Neuro/Psych PSYCHIATRIC DISORDERS Anxiety negative neurological ROS     GI/Hepatic Neg liver ROS, GERD  Medicated,  Endo/Other  negative endocrine ROS  Renal/GU negative Renal ROS  negative genitourinary   Musculoskeletal   Abdominal   Peds  Hematology  (+) Blood dyscrasia, anemia ,   Anesthesia Other Findings   Reproductive/Obstetrics negative OB ROS                             Anesthesia Physical Anesthesia Plan  ASA: 2  Anesthesia Plan: General   Post-op Pain Management:    Induction:   PONV Risk Score and Plan:   Airway Management Planned:   Additional Equipment:   Intra-op Plan:   Post-operative Plan:   Informed Consent: I have reviewed the patients History and Physical, chart, labs and discussed the procedure including the risks, benefits and alternatives for the proposed anesthesia with the patient or authorized representative who has indicated his/her understanding and acceptance.     Dental Advisory Given  Plan Discussed with: CRNA  Anesthesia Plan Comments:         Anesthesia Quick Evaluation

## 2022-01-21 NOTE — Progress Notes (Signed)
One unit PRBC completed with no signs or symptoms of adverse reactions. One time dose of IV lasix given per orders. Dr Joesph Fillers made aware of patient blood pressures (especially diastolic's being in the 79-15'W) and the Maps being between 60-65 this morning. Patient alert and oriented with no complaints currently.

## 2022-01-21 NOTE — Brief Op Note (Signed)
01/20/2022 - 01/21/2022  3:58 PM  PATIENT:  Mary Moss  86 y.o. female  PRE-OPERATIVE DIAGNOSIS:  upper gastrointestinal bleeding, rectal bleeding  POST-OPERATIVE DIAGNOSIS:  EGD;  gastric ulcers x3; gastric erosions;  COLON:  diverticulosis, hemorroids  PROCEDURE:  Procedure(s) with comments: ESOPHAGOGASTRODUODENOSCOPY (EGD) WITH PROPOFOL (N/A) COLONOSCOPY WITH PROPOFOL (N/A) BIOPSY - gastric ulcer biopsies, gastric biopsies;  SURGEON:  Surgeon(s) and Role:    * Harvel Quale, MD - Primary  Patient underwent EGD under propofol sedation.  Tolerated the procedure adequately.  Esophagus showed A 1 cm hiatal hernia was present. Two non-bleeding cratered gastric ulcers with a clean ulcer base (Forrest Class III) were found in the gastric antrum.  The largest lesion was 10 mm in largest dimension.  Biopsies from ulcer edge, antrum and body were taken with a cold forceps for histology and H. pylori.  Patchy mildly erythematous mucosa without active bleeding was found in the first portion of the duodenum.   Colonoscopy was also performed which only was positive for hemorrhoids and non bleeding diverticulosis.  RECOMMENDATIONS - Return patient to hospital ward - Resume previous diet.  - Await pathology results.  - Continue pantoprazole 40 mg PO BID for at least 2 months. - Check H. pylori IgG. - No ibuprofen, naproxen, or other non-steroidal anti-inflammatory drugs.  - Repeat upper endoscopy in 2 months for surveillance.   Maylon Peppers, MD Gastroenterology and Hepatology [facility]

## 2022-01-21 NOTE — Progress Notes (Signed)
PROGRESS NOTE     Mary Moss, is a 86 y.o. female, DOB - 07/03/1936, SWN:[id number]  Admit date - 01/20/2022   Admitting Physician Loula Marcella Denton Brick, MD  Outpatient Primary MD for the patient is Sharion Balloon, FNP  LOS - 0  Chief Complaint  Patient presents with   Hematemesis        Brief Narrative:  female with medical history significant for hypertension, prediabetes, history of DVT admitted with hematemesis and bright red blood per rectum on 01/20/2022 in the setting of NSAID use while on Xarelto    -Assessment and Plan: * Acute GI bleeding Several episodes of hematemesis and hematochezia.  -  Hemoglobin down 2 points at 9.2 from baseline 11-13.  - No history of GI blood loss.  On Xarelto for DVT prophylaxis for recent left hip arthroplasty. - EDP consulted GI, evaluated by Dr. Jenetta Downer -IV Protonix 40 twice daily -Possible EGD and colonoscopy today -Hold Xarelto   Seropositive rheumatoid arthritis ([place]) Resume Plaquenil.  S/P total left hip arthroplasty Done 6/7 by Dr. Wynelle Link, for advanced end-stage left hip osteoarthritis.  Placed on Xarelto for DVT prophylaxis.  Surgical site looks clean, well opposed without evidence of infection, almost completely healed -Hold Xarelto for now in setting of GI bleed. -Morphine 2 mg q4h as needed for pain  Essential hypertension Stable -Hold HCTZ and lisinopril in setting of GI bleed  Disposition/Need for in-Hospital Stay- patient unable to be discharged at this time due to pending Endoscopy findings  Status is: Inpatient   Disposition: The patient is from: Home              Anticipated d/c is to: Home              Anticipated d/c date is: 1 day              Patient currently is not medically stable to d/c. Barriers: Not Clinically Stable-   Code Status :-  Code Status: Full Code   Family Communication:    NA (patient is alert, awake and coherent)   DVT Prophylaxis  :   - SCDs   SCDs Start: 01/20/22  1954   Lab Results  Component Value Date   PLT 346 01/21/2022    Inpatient Medications  Scheduled Meds:  Chlorhexidine Gluconate Cloth  6 each Topical Daily   hydroxychloroquine  400 mg Oral Daily   pantoprazole (PROTONIX) IV  40 mg Intravenous Q12H   Continuous Infusions: PRN Meds:.acetaminophen **OR** acetaminophen, morphine injection, ondansetron (ZOFRAN) IV, polyethylene glycol   Anti-infectives (From admission, onward)    Start     Dose/Rate Route Frequency Ordered Stop   01/20/22 2045  hydroxychloroquine (PLAQUENIL) tablet 400 mg        400 mg Oral Daily 01/20/22 1953           Subjective: Lorre Munroe today has no fevers, no further emesis,  No chest pain,  . No chest pain/No Shob  Objective: Vitals:   01/21/22 1553 01/21/22 1600 01/21/22 1608 01/21/22 1628  BP: (!) 104/47 (!) 114/58 (!) 126/53 121/65  Pulse: 87 89 89 93  Resp: (!) 24 (!) 27 (!) 21   Temp: 97.8 F (36.6 C)   97.9 F (36.6 C)  TempSrc:    Oral  SpO2: 99% 99% 100% 98%  Weight:    92.8 kg  Height:    '5\' 8"'$  (1.727 m)    Intake/Output Summary (Last 24 hours) at 01/21/2022 1851 Last data filed at 01/21/2022  1546 Gross per 24 hour  Intake 720.29 ml  Output 406 ml  Net 314.29 ml   Filed Weights   01/20/22 1804 01/21/22 0500 01/21/22 1628  Weight: 94 kg 94 kg 92.8 kg    Physical Exam  Gen:- Awake Alert,  in no apparent distress  HEENT:- [place].AT, No sclera icterus Neck-Supple Neck,No JVD,.  Lungs-  CTAB , fair symmetrical air movement CV- S1, S2 normal, regular  Abd-  +ve B.Sounds, Abd Soft, No tenderness,    Extremity/Skin:- No  edema, pedal pulses present  Psych-affect is appropriate, oriented x3 Neuro-no new focal deficits, no tremors MSK -healed Lt hip wound  Data Reviewed: I have personally reviewed following labs and imaging studies  CBC: Recent Labs  Lab 01/20/22 1343 01/20/22 2051 01/21/22 0353  WBC 8.6 9.2 8.6  HGB 9.2* 8.7* 7.5*  HCT 29.3* 27.4* 23.3*  MCV  98.3 98.9 97.1  PLT 339 427* 287   Basic Metabolic Panel: Recent Labs  Lab 01/20/22 1343  NA 139  K 4.4  CL 103  CO2 25  GLUCOSE 112*  BUN 47*  CREATININE 0.91  CALCIUM 9.1   GFR: Estimated Creatinine Clearance: 55.8 mL/min (by C-G formula based on SCr of 0.91 mg/dL). Liver Function Tests: Recent Labs  Lab 01/20/22 1343  AST 21  ALT 21  ALKPHOS 91  BILITOT 0.5  PROT 6.6  ALBUMIN 2.8*   Cardiac Enzymes: No results for input(s): "CKTOTAL", "CKMB", "CKMBINDEX", "TROPONINI" in the last 168 hours. BNP (last 3 results) No results for input(s): "PROBNP" in the last 8760 hours. HbA1C: No results for input(s): "HGBA1C" in the last 72 hours. Sepsis Labs: '@LABRCNTIP'$ (procalcitonin:4,lacticidven:4) ) Recent Results (from the past 240 hour(s))  MRSA Next Gen by PCR, Nasal     Status: None   Collection Time: 01/20/22  6:05 PM   Specimen: Nasal Mucosa; Nasal Swab  Result Value Ref Range Status   MRSA by PCR Next Gen NOT DETECTED NOT DETECTED Final    Comment: (NOTE) The GeneXpert MRSA Assay (FDA approved for NASAL specimens only), is one component of a comprehensive MRSA colonization surveillance program. It is not intended to diagnose MRSA infection nor to guide or monitor treatment for MRSA infections. Test performance is not FDA approved in patients less than 59 years old. Performed at [facility], [address], [place], [place] [address]      Radiology Studies: No results found.   Scheduled Meds:  Chlorhexidine Gluconate Cloth  6 each Topical Daily   hydroxychloroquine  400 mg Oral Daily   pantoprazole (PROTONIX) IV  40 mg Intravenous Q12H   Continuous Infusions:   LOS: 0 days    Roxan Hockey M.D on 01/21/2022 at 6:51 PM  Go to [email or web address] - for contact info  [facility] - Office  [phone]  If 7PM-7AM, please contact night-coverage [email or web address] 01/21/2022, 6:51 PM

## 2022-01-21 NOTE — Transfer of Care (Signed)
Immediate Anesthesia Transfer of Care Note  Patient: Mary Moss  Procedure(s) Performed: ESOPHAGOGASTRODUODENOSCOPY (EGD) WITH PROPOFOL COLONOSCOPY WITH PROPOFOL BIOPSY  Patient Location: PACU  Anesthesia Type:General  Level of Consciousness: drowsy  Airway & Oxygen Therapy: Patient Spontanous Breathing  Post-op Assessment: Report given to RN and Post -op Vital signs reviewed and stable  Post vital signs: Reviewed and stable  Last Vitals:  Vitals Value Taken Time  BP    Temp    Pulse    Resp    SpO2      Last Pain:  Vitals:   01/21/22 1500  TempSrc:   PainSc: 10-Worst pain ever      Patients Stated Pain Goal: 3 (63/78/58 8502)  Complications: No notable events documented.

## 2022-01-21 NOTE — Op Note (Signed)
[facility] Patient Name: Mary Moss Procedure Date: 01/21/2022 3:14 PM MRN: [id number] Date of Birth: 11/30/1935 Attending MD: Maylon Peppers ,  CSN: [id number] Age: 86 Admit Type: Outpatient Procedure:                Colonoscopy Indications:              Rectal bleeding Providers:                Maylon Peppers, Everardo Pacific, [place]                            Risa Grill, Technician Referring MD:              Medicines:                Monitored Anesthesia Care Complications:            No immediate complications. Estimated Blood Loss:     Estimated blood loss: none. Procedure:                Pre-Anesthesia Assessment:                           - Prior to the procedure, a History and Physical                            was performed, and patient medications, allergies                            and sensitivities were reviewed. The patient's                            tolerance of previous anesthesia was reviewed.                           - The risks and benefits of the procedure and the                            sedation options and risks were discussed with the                            patient. All questions were answered and informed                            consent was obtained.                           - ASA Grade Assessment: II - A patient with mild                            systemic disease.                           After obtaining informed consent, the colonoscope                            was passed under direct vision. Throughout the  procedure, the patient's blood pressure, pulse, and                            oxygen saturations were monitored continuously. The                            [id number]-HQ190L ([id number]) scope was introduced through                            the anus and advanced to the the cecum, identified                            by appendiceal orifice and ileocecal valve. The                             colonoscopy was performed without difficulty. The                            patient tolerated the procedure well. The quality                            of the bowel preparation was good. Scope In: 3:33:25 PM Scope Out: 3:49:50 PM Scope Withdrawal Time: 0 hours 10 minutes 10 seconds  Total Procedure Duration: 0 hours 16 minutes 25 seconds  Findings:      The perianal and digital rectal examinations were normal.      Scattered small and large-mouthed diverticula were found in the sigmoid       colon and descending colon.      Non-bleeding internal hemorrhoids were found during retroflexion. The       hemorrhoids were small.      Note:rectal bleeding likely related to rapid UGI bleeding that has now       stopped. Impression:               - Diverticulosis in the sigmoid colon and in the                            descending colon.                           - Non-bleeding internal hemorrhoids.                           - No specimens collected. Moderate Sedation:      Per Anesthesia Care Recommendation:           - Return patient to hospital ward for ongoing care.                           - Resume previous diet.                           - Repeat colonoscopy is not recommended due to                            current age (37 years or older) for screening  purposes.                           - Restart systemic AC tomorrow Procedure Code(s):        --- Professional ---                           [phone], Colonoscopy, flexible; diagnostic, including                            collection of specimen(s) by brushing or washing,                            when performed (separate procedure) Diagnosis Code(s):        --- Professional ---                           K64.8, Other hemorrhoids                           K62.5, Hemorrhage of anus and rectum                           K57.30, Diverticulosis of large intestine without                            perforation or  abscess without bleeding CPT copyright 2019 American Medical Association. All rights reserved. The codes documented in this report are preliminary and upon coder review may  be revised to meet current compliance requirements. Maylon Peppers, MD Maylon Peppers,  01/21/2022 4:03:01 PM This report has been signed electronically. Number of Addenda: 0

## 2022-01-21 NOTE — Progress Notes (Signed)
  Transition of Care [facility]) Screening Note   Patient Details  Name: GAYLE MARTINEZ Date of Birth: 04/02/1936   Transition of Care [facility]) CM/SW Contact:    Ihor Gully, LCSW Phone Number: 01/21/2022, 12:17 PM    Transition of Care Department [facility]) has reviewed patient and no TOC needs have been identified at this time. We will continue to monitor patient advancement through interdisciplinary progression rounds. If new patient transition needs arise, please place a TOC consult.

## 2022-01-21 NOTE — Progress Notes (Signed)
We will proceed with EGD and colonoscopy as scheduled.  I thoroughly discussed with the patient his procedure, including the risks involved. Patient understands what the procedure involves including the benefits and any risks. Patient understands alternatives to the proposed procedure. Risks including (but not limited to) bleeding, tearing of the lining (perforation), rupture of adjacent organs, problems with heart and lung function, infection, and medication reactions. A small percentage of complications may require surgery, hospitalization, repeat endoscopic procedure, and/or transfusion.  Patient understood and agreed. ? ?Mary Zenon Castaneda, MD ?Gastroenterology and Hepatology ?[facility] ? ?

## 2022-01-22 ENCOUNTER — Telehealth: Payer: Self-pay | Admitting: Gastroenterology

## 2022-01-22 DIAGNOSIS — K625 Hemorrhage of anus and rectum: Secondary | ICD-10-CM | POA: Diagnosis not present

## 2022-01-22 DIAGNOSIS — D62 Acute posthemorrhagic anemia: Secondary | ICD-10-CM | POA: Diagnosis not present

## 2022-01-22 LAB — TYPE AND SCREEN
ABO/RH(D): A POS
Antibody Screen: NEGATIVE
Unit division: 0

## 2022-01-22 LAB — CBC
HCT: 25.3 % — ABNORMAL LOW (ref 36.0–46.0)
Hemoglobin: 8 g/dL — ABNORMAL LOW (ref 12.0–15.0)
MCH: 30 pg (ref 26.0–34.0)
MCHC: 31.6 g/dL (ref 30.0–36.0)
MCV: 94.8 fL (ref 80.0–100.0)
Platelets: 327 10*3/uL (ref 150–400)
RBC: 2.67 MIL/uL — ABNORMAL LOW (ref 3.87–5.11)
RDW: 14.5 % (ref 11.5–15.5)
WBC: 5.8 10*3/uL (ref 4.0–10.5)
nRBC: 0 % (ref 0.0–0.2)

## 2022-01-22 LAB — BPAM RBC
Blood Product Expiration Date: 202307182359
ISSUE DATE / TIME: 202306280955
Unit Type and Rh: 6200

## 2022-01-22 LAB — GLUCOSE, CAPILLARY: Glucose-Capillary: 93 mg/dL (ref 70–99)

## 2022-01-22 MED ORDER — POLYETHYLENE GLYCOL 3350 17 G PO PACK: 17.0000 g | PACK | Freq: Every day | ORAL | 3 refills | Status: DC

## 2022-01-22 MED ORDER — ASPIRIN 81 MG PO TBEC: 81.0000 mg | DELAYED_RELEASE_TABLET | Freq: Every day | ORAL | 0 refills | Status: DC

## 2022-01-22 MED ORDER — ACETAMINOPHEN 325 MG PO TABS
650.0000 mg | ORAL_TABLET | Freq: Four times a day (QID) | ORAL | 0 refills | Status: AC | PRN
Start: 2022-01-22 — End: ?

## 2022-01-22 MED ORDER — HYDROCHLOROTHIAZIDE 25 MG PO TABS: 12.5000 mg | ORAL_TABLET | Freq: Every day | ORAL | 2 refills | Status: DC

## 2022-01-22 MED ORDER — PANTOPRAZOLE SODIUM 40 MG PO TBEC: 40.0000 mg | DELAYED_RELEASE_TABLET | Freq: Two times a day (BID) | ORAL | 5 refills | Status: DC

## 2022-01-22 MED ORDER — BREZTRI AEROSPHERE 160-9-4.8 MCG/ACT IN AERO: 2.0000 | INHALATION_SPRAY | Freq: Two times a day (BID) | RESPIRATORY_TRACT | 1 refills | Status: DC

## 2022-01-22 NOTE — Progress Notes (Signed)
Patient discharged. Will arrange for follow up, plans for EGD in 2-3 months to document ulcer healing.

## 2022-01-22 NOTE — Progress Notes (Signed)
Patient has done well this shift.  Patient has ambualted x 2 in hallway with walker and staff supervision. (Patient had hip surgery 3 weeks ago).  Vitals have been stable and no bleeding noted from patient.

## 2022-01-22 NOTE — Telephone Encounter (Signed)
Patient went home from hospital today. EGD yesterday with gastric ulcers. Dr. Jenetta Downer recommended EGD in 2 months and H.pylori serologies are pending.

## 2022-01-22 NOTE — Telephone Encounter (Signed)
Thanks Mitzie, Can you please schedule a follow up appointment for this patient in 3-4 weeks?  Thanks,  Maylon Peppers, MD Gastroenterology and Hepatology [facility]

## 2022-01-22 NOTE — Discharge Summary (Addendum)
Mary Moss, is a 86 y.o. female  DOB [birth year]  MRN [id number].  Admission date:  01/20/2022  Admitting Physician  Roxan Hockey, MD  Discharge Date:  01/22/2022   Primary MD  Sharion Balloon, FNP  Recommendations for primary care physician for things to follow:   1)Avoid ibuprofen/Advil/Aleve/Motrin/Goody Powders/Naproxen/BC powders/Meloxicam/Diclofenac/Indomethacin and other Nonsteroidal anti-inflammatory medications as these will make you more likely to bleed and can cause stomach ulcers, can also cause Kidney problems.   2)Repeat CBC and BMP blood test with primary care physician around Wednesday, 01/29/2022  3)Follow up with Dr. Jefm Petty 6 to 8 weeks for repeat upper endoscopy - address [address], [address], [place] Number [phone]   4) okay to stop Xarelto, take baby aspirin 81 mg daily with food for 3 weeks only for blood clot prevention after your recent hip surgery  Admission Diagnosis  Rectal bleeding [K62.5] Acute GI bleeding [K92.2] Anemia associated with acute blood loss [D62] Symptomatic anemia [D64.9]   Discharge Diagnosis  Rectal bleeding [K62.5] Acute GI bleeding [K92.2] Anemia associated with acute blood loss [D62] Symptomatic anemia [D64.9]    Principal Problem:   Acute GI bleeding Active Problems:   Essential hypertension   S/P total left hip arthroplasty   Seropositive rheumatoid arthritis (HCC)   Symptomatic anemia      Past Medical History:  Diagnosis Date   Ankle fracture fx 13 yrs ago, anklw swells off and on, has cramps in ankle also   left, to seee dr hewitt about 02-16-14   Arthritis    osteo and RA   Basal cell carcinoma 2015   generalized   Difficult intubation    GERD (gastroesophageal reflux disease)    OTC med   pt. denies   Hypertension    Pneumonia    Pre-diabetes     Past Surgical History:   Procedure Laterality Date   ABDOMINAL HYSTERECTOMY     EYE SURGERY Bilateral    bilaterla cataract extraction with IOL   FRACTURE SURGERY Left 2001   JOINT REPLACEMENT     KNEE ARTHROSCOPY Right 02/07/2014   Procedure: RIGHT ARTHROSCOPY KNEE, WITH MEDIAL MENISCAL DEBRIDEMENT AND CHONDRAPLASTY;  Surgeon: Gearlean Alf, MD;  Location: [facility];  Service: Orthopedics;  Laterality: Right;   ROTATOR CUFF REPAIR Right 2009   TOTAL ANKLE ARTHROPLASTY Left 12/20/2014   Procedure: LEFT TOTAL ANKLE ARTHOPLASTY WITH PERCUTANEOUS ACHILLES TENDON LENGTHENING;  Surgeon: Wylene Simmer, MD;  Location: [facility];  Service: Orthopedics;  Laterality: Left;   TOTAL HIP ARTHROPLASTY Left 12/31/2021   Procedure: TOTAL HIP ARTHROPLASTY ANTERIOR APPROACH;  Surgeon: Gaynelle Arabian, MD;  Location: [facility];  Service: Orthopedics;  Laterality: Left;   TOTAL KNEE ARTHROPLASTY Right 06/08/2016   Procedure: RIGHT TOTAL KNEE ARTHROPLASTY;  Surgeon: Gaynelle Arabian, MD;  Location: [facility];  Service: Orthopedics;  Laterality: Right;   TOTAL SHOULDER ARTHROPLASTY Right 11/23/2019   Procedure: REVERSE TOTAL SHOULDER ARTHROPLASTY;  Surgeon: Tania Ade, MD;  Location: [facility];  Service: Orthopedics;  Laterality:  Right;     HPI  from the history and physical done on the day of admission:   Chief Complaint: Vomiting Blood, blood in stools   HPI: Mary Moss is a 86 y.o. female with medical history significant for hypertension, prediabetes.  Patient was brought to the ED EMS with reports of several episodes of vomiting blood and bright red blood per rectum.  Symptoms started about 2 days ago, but significantly worsened yesterday at about 2 AM this morning.  She reports dizziness and feeling worn out.  Patient had left hip surgery 6/7 , she was placed on Xarelto for DVT prophylaxis.  Prior to this she reports taking ibuprofen for chronic left hip pain.  She denies abdominal pain.   EMS reports blood pressure of 90/50.   Recent  hospitalization 6/7 to 6/8, patient with advanced end-stage arthritis of the left hip and had total hip arthroplasty 6/7.   ED Course: Tmax 99.2.  Heart rate 93-104.  Respiratory rate 15-21.  O2 sats greater than 92% on room air.  Hemoglobin 9.2, baseline 11-13. Dr. Jenetta Downer was consulted by EDP, recommended n.p.o. midnight, clear liquids till then, Reglan, Protonix 40 twice daily.   Review of Systems: As per HPI all other systems reviewed and negative.     Hospital Course:    Assessment and Plan: * Acute GI bleeding Several episodes of hematemesis and hematochezia.  Reported dizziness -Hemoglobin down 2 points at 9.2 from baseline 11-13.   No history of GI blood loss.   -On Xarelto for DVT prophylaxis for recent left hip arthroplasty. -EGD on 01/21/22 showed Two non-bleeding cratered gastric ulcers with a clean ulcer base (Forrest Class III) were found in the gastric antrum.  The largest lesion was 10 mm in largest dimension -Colonoscopy showed non-bleeding Internal Hemorrhoids -Treated with IV Protonix -Hgb is 8.0 - Seropositive rheumatoid arthritis (HCC) Resume Plaquenil.  S/P total left hip arthroplasty Done 12/31/21 by Dr. Wynelle Link, for advanced end-stage left hip osteoarthritis.  Placed on Xarelto for DVT prophylaxis.  Surgical site looks clean, well opposed without evidence of infection, almost completely healed Completed 3weeks  of Xarelto for DVT Prophylaxis... ok to use ASA for DVT prophylaxis in lieu of Xarelto given Gi Bleed  Essential hypertension Stable PTA Was on  HCTZ and lisinopril   Discharge Condition: stable  Follow UP   Follow-up Information     Harvel Quale, MD. Schedule an appointment as soon as possible for a visit in 2 month(s).   Specialty: Gastroenterology Contact information: [address] [place] [address] [phone]         Evelina Dun A, FNP Follow up in 1 week(s).   Specialty: Family Medicine Why:  Repeat CBC and BMP blood test Contact information: [place] [address] [phone]                  Consults obtained -Gi--Dr [place] and Activity recommendation:  As advised  Discharge Instructions    Discharge Instructions     Call MD for:  difficulty breathing, headache or visual disturbances   Complete by: As directed    Call MD for:  persistant dizziness or light-headedness   Complete by: As directed    Call MD for:  persistant nausea and vomiting   Complete by: As directed    Call MD for:  severe uncontrolled pain   Complete by: As directed    Call MD for:  temperature >100.4  Complete by: As directed    Diet - low sodium heart healthy   Complete by: As directed    Discharge instructions   Complete by: As directed    1)Avoid ibuprofen/Advil/Aleve/Motrin/Goody Powders/Naproxen/BC powders/Meloxicam/Diclofenac/Indomethacin and other Nonsteroidal anti-inflammatory medications as these will make you more likely to bleed and can cause stomach ulcers, can also cause Kidney problems.   2)Repeat CBC and BMP blood test with primary care physician around Wednesday, 01/29/2022  3)Follow up with Dr. Jefm Petty 6 to 8 weeks for repeat upper endoscopy - address [address], [address], [place] Number [phone]   4) okay to stop Xarelto, take baby aspirin 81 mg daily with food for 3 weeks only for blood clot prevention after your recent hip surgery   Increase activity slowly   Complete by: As directed    No wound care   Complete by: As directed        Discharge Medications     Allergies as of 01/22/2022       Reactions   Statins    Simvastatin, crestor, livalo Not able to walk because so much muscle/joint pain   Neosporin [neomycin-bacitracin Zn-polymyx] Rash   Polysporin [bacitracin-polymyxin B] Rash        Medication List     STOP taking these medications    methocarbamol 500 MG  tablet Commonly known as: ROBAXIN   rivaroxaban 10 MG Tabs tablet Commonly known as: XARELTO   traMADol 50 MG tablet Commonly known as: ULTRAM       TAKE these medications    acetaminophen 325 MG tablet Commonly known as: TYLENOL Take 2 tablets (650 mg total) by mouth every 6 (six) hours as needed for mild pain (or Fever >/= 101).   aspirin EC 81 MG tablet Take 1 tablet (81 mg total) by mouth daily with breakfast for 21 days. ONLY, then STOP   Breztri Aerosphere 160-9-4.8 MCG/ACT Aero Generic drug: Budeson-Glycopyrrol-Formoterol Inhale 2 puffs into the lungs 2 (two) times daily.   clobetasol cream 0.05 % Commonly known as: TEMOVATE Apply 1 application topically 2 (two) times daily as needed (vaginal irritation.).   D3-50 1.25 MG (50000 UT) capsule Generic drug: Cholecalciferol Take 50,000 Units by mouth every Sunday.   hydrochlorothiazide 25 MG tablet Commonly known as: HYDRODIURIL Take 0.5 tablets (12.5 mg total) by mouth daily. What changed: how much to take   HYDROcodone-acetaminophen 5-325 MG tablet Commonly known as: NORCO/VICODIN Take 1-2 tablets by mouth every 6 (six) hours as needed for severe pain.   hydroxychloroquine 200 MG tablet Commonly known as: PLAQUENIL Take 400 mg by mouth daily.   lisinopril 10 MG tablet Commonly known as: ZESTRIL TAKE 1 TABLET BY MOUTH EVERY DAY   pantoprazole 40 MG tablet Commonly known as: Protonix Take 1 tablet (40 mg total) by mouth 2 (two) times daily.   polyethylene glycol 17 g packet Commonly known as: MIRALAX / GLYCOLAX Take 17 g by mouth daily.   Tylenol PM Extra Strength 25-500 MG Tabs tablet Generic drug: diphenhydramine-acetaminophen Take 1 tablet by mouth at bedtime as needed (sleep).        Major procedures and Radiology Reports - PLEASE review detailed and final reports for all details, in brief -   DG Pelvis Portable  Result Date: 12/31/2021 CLINICAL DATA:  Post hip replacement EXAM: PORTABLE  PELVIS 1-2 VIEWS COMPARISON:  Portable exam 1116 hours compared to 08/08/2020 FINDINGS: Osseous demineralization. New LEFT hip prosthesis without fracture or dislocation on single AP view. Mild  narrowing of RIGHT hip joint. Soft tissues unremarkable. IMPRESSION: LEFT hip prosthesis without acute complication. Electronically Signed   By: Lavonia Dana M.D.   On: 12/31/2021 12:57   DG HIP UNILAT WITH PELVIS 1V LEFT  Result Date: 12/31/2021 CLINICAL DATA:  Total left hip arthroplasty EXAM: DG HIP (WITH OR WITHOUT PELVIS) 1V*L* COMPARISON:  None Available. FINDINGS: Multiple intraoperative fluoroscopic spot images are provided. Interval left total hip arthroplasty. Normal alignment. FLUOROSCOPY TIME:  Radiation Exposure Index: 0.67 mGy IMPRESSION: 1. Interval left total hip arthroplasty. Electronically Signed   By: Kathreen Devoid M.D.   On: 12/31/2021 11:15   DG C-Arm 1-60 Min-No Report  Result Date: 12/31/2021 Fluoroscopy was utilized by the requesting physician.  No radiographic interpretation.    Micro Results  Recent Results (from the past 240 hour(s))  MRSA Next Gen by PCR, Nasal     Status: None   Collection Time: 01/20/22  6:05 PM   Specimen: Nasal Mucosa; Nasal Swab  Result Value Ref Range Status   MRSA by PCR Next Gen NOT DETECTED NOT DETECTED Final    Comment: (NOTE) The GeneXpert MRSA Assay (FDA approved for NASAL specimens only), is one component of a comprehensive MRSA colonization surveillance program. It is not intended to diagnose MRSA infection nor to guide or monitor treatment for MRSA infections. Test performance is not FDA approved in patients less than 84 years old. Performed at [facility], [address] [place], [place] [address]    Today   Subjective    Haydan Wedig today has no new complaints   No Nausea, Vomiting or Diarrhea No fever  Or chills    Patient has been seen and examined prior to discharge   Objective   Blood pressure (!) 115/52, pulse 96,  temperature 99.5 F (37.5 C), resp. rate 20, height '5\' 8"'$  (1.727 m), weight 92.8 kg, SpO2 93 %.   Intake/Output Summary (Last 24 hours) at 01/22/2022 1048 Last data filed at 01/22/2022 0500 Gross per 24 hour  Intake 1090.67 ml  Output 400 ml  Net 690.67 ml    Exam Gen:- Awake Alert, no acute distress  HEENT:- [place].AT, No sclera icterus Neck-Supple Neck,No JVD,.  Lungs-  CTAB , good air movement bilaterally CV- S1, S2 normal, regular Abd-  +ve B.Sounds, Abd Soft, No tenderness,    Extremity/Skin:- No  edema,   good pulses, Rt Upper Thigh healed surgical wound Psych-affect is appropriate, oriented x3 Neuro-no new focal deficits, no tremors    Data Review   CBC w Diff:  Lab Results  Component Value Date   WBC 5.8 01/22/2022   HGB 8.0 (L) 01/22/2022   HGB 13.2 11/20/2021   HCT 25.3 (L) 01/22/2022   HCT 39.6 11/20/2021   PLT 327 01/22/2022   PLT 272 11/20/2021   LYMPHOPCT 32 11/16/2019   MONOPCT 8 11/16/2019   EOSPCT 3 11/16/2019   BASOPCT 1 11/16/2019    CMP:  Lab Results  Component Value Date   NA 139 01/20/2022   NA 143 11/20/2021   K 4.4 01/20/2022   CL 103 01/20/2022   CO2 25 01/20/2022   BUN 47 (H) 01/20/2022   BUN 16 11/20/2021   CREATININE 0.91 01/20/2022   PROT 6.6 01/20/2022   PROT 6.4 11/20/2021   ALBUMIN 2.8 (L) 01/20/2022   ALBUMIN 3.8 11/20/2021   BILITOT 0.5 01/20/2022   BILITOT 0.5 11/20/2021   ALKPHOS 91 01/20/2022   AST 21 01/20/2022   ALT 21 01/20/2022  .  Total Discharge time is about 33 minutes  Roxan Hockey M.D on 01/22/2022 at 10:48 AM  Go to [email or web address] -  for contact info  [facility] - Office  [phone]

## 2022-01-22 NOTE — Discharge Instructions (Signed)
1)Avoid ibuprofen/Advil/Aleve/Motrin/Goody Powders/Naproxen/BC powders/Meloxicam/Diclofenac/Indomethacin and other Nonsteroidal anti-inflammatory medications as these will make you more likely to bleed and can cause stomach ulcers, can also cause Kidney problems.   2)Repeat CBC and BMP blood test with primary care physician around Wednesday, 01/29/2022  3)Follow up with Dr. Jefm Petty 6 to 8 weeks for repeat upper endoscopy - address [address], [place] [address],,[place] Number [phone]   4) okay to stop Xarelto, take baby aspirin 81 mg daily with food for 3 weeks only for blood clot prevention after your recent hip surgery

## 2022-01-23 ENCOUNTER — Telehealth: Payer: Self-pay

## 2022-01-23 LAB — H. PYLORI ANTIBODY, IGG: H Pylori IgG: 0.21 Index Value (ref 0.00–0.79)

## 2022-01-23 NOTE — Telephone Encounter (Signed)
Transition Care Management Follow-up Telephone Call Date of discharge and from where: 01/22/22 - [place] How have you been since you were released from the hospital? Still weak Any questions or concerns? No  Items Reviewed: Did the pt receive and understand the discharge instructions provided? Yes  Medications obtained and verified? Yes  Other? No  Any new allergies since your discharge? No  Dietary orders reviewed? Yes Do you have support at home? Yes   Home Care and Equipment/Supplies: Were home health services ordered? no Were any new equipment or medical supplies ordered?  No  Functional Questionnaire: (I = Independent and D = Dependent) ADLs: I  Bathing/Dressing- I  Meal Prep- I  Eating- I  Maintaining continence- I  Transferring/Ambulation- I  Managing Meds- I  Follow up appointments reviewed:  PCP Hospital f/u appt confirmed? Yes  Scheduled to see Christy on 01/30/22 @ 1:55. [facility] f/u appt confirmed? No  but will f/u with Jenetta Downer Are transportation arrangements needed? No  If their condition worsens, is the pt aware to call PCP or go to the Emergency Dept.? Yes Was the patient provided with contact information for the PCP's office or ED? Yes Was to pt encouraged to call back with questions or concerns? Yes

## 2022-01-26 LAB — SURGICAL PATHOLOGY

## 2022-01-28 ENCOUNTER — Encounter (HOSPITAL_COMMUNITY): Payer: Self-pay | Admitting: Gastroenterology

## 2022-01-30 ENCOUNTER — Ambulatory Visit (INDEPENDENT_AMBULATORY_CARE_PROVIDER_SITE_OTHER): Payer: Medicare Other | Admitting: Family

## 2022-01-30 ENCOUNTER — Encounter: Payer: Self-pay | Admitting: Family

## 2022-01-30 VITALS — BP 117/58 | HR 89 | Temp 97.9°F | Ht 68.0 in | Wt 206.0 lb

## 2022-01-30 DIAGNOSIS — K922 Gastrointestinal hemorrhage, unspecified: Secondary | ICD-10-CM

## 2022-01-30 DIAGNOSIS — R531 Weakness: Secondary | ICD-10-CM

## 2022-01-30 DIAGNOSIS — Z09 Encounter for follow-up examination after completed treatment for conditions other than malignant neoplasm: Secondary | ICD-10-CM | POA: Diagnosis not present

## 2022-01-30 DIAGNOSIS — Z96642 Presence of left artificial hip joint: Secondary | ICD-10-CM

## 2022-01-30 NOTE — Patient Instructions (Signed)
Gastrointestinal Bleeding Gastrointestinal (GI) bleeding is bleeding somewhere along the digestive tract, between the mouth and the anus. The digestive tract includes the mouth, esophagus, stomach, small intestine, large intestine, and anus. The large intestine is often called the colon. GI bleeding can be caused by various problems. The severity of these problems can be mild, serious, or life-threatening. If you have GI bleeding, you may find blood in your stools (feces), you may have black stools, or you may vomit blood. You may need to stay in the hospital if there is a lot of bleeding. What are the causes? This condition may be caused by: Inflammation, irritation, or swelling of the esophagus (esophagitis). The esophagus is part of the body that moves food from your mouth to your stomach. Swollen veins in the rectum (hemorrhoids). Tears in the anus (anal fissures). The tears are often caused by passing hard stool. Pouches that form on the colon and may bleed (diverticulosis). Inflammation in areas with diverticulosis. This is called diverticulitis.This can cause pain, fever, and bloody stools. Growths (polyps) or cancer. Colon cancer often starts out as precancerous polyps. Gastritis and ulcers. These may cause bleeding in the upper GI tract, near the stomach. What increases the risk? You are more likely to develop this condition if: You have an infection in your stomach from a type of bacteria called Helicobacter pylori. You take certain medicines, such as: NSAIDs. Aspirin. Selective serotonin reuptake inhibitors (SSRIs). Steroids. Antiplatelet or anticoagulant medicines. You smoke. You drink alcohol. What are the signs or symptoms? Common symptoms of this condition include: Bright red blood in your vomit, or vomit that looks like coffee grounds. Bloody, black, or tarry stools. Bleeding from the lower GI tract will usually cause red or maroon blood in the stools. Bleeding from the  upper GI tract may cause black, tarry stools that are often stronger smelling than usual. In certain cases, if the bleeding is fast enough, the stools may be red. Pain or cramping in the abdomen. How is this diagnosed? This condition may be diagnosed based on: Your medical history and a physical exam. Various tests, such as: Blood tests. Stool tests. X-rays and other imaging tests. Esophagogastroduodenoscopy (EGD). In this test, a flexible, lighted tube is used to look at your esophagus, stomach, and small intestine. Colonoscopy. In this test, a flexible, lighted tube is used to look at your colon. How is this treated? Treatment for this condition depends on the cause of the bleeding. For example: For bleeding from the esophagus, stomach, small intestine, or colon, the health care provider may do a procedure to stop bleeding during your EGD or colonoscopy. Inflammation or infection of the colon can be treated with medicines. Certain rectal problems can be treated with creams, suppositories, or warm baths. Medicines may be given to reduce acid in your stomach. Surgery is sometimes done. Blood transfusions are sometimes needed if a lot of blood has been lost. If there is a lot of bleeding, you will need to stay in the hospital for observation. If bleeding is mild, you may be allowed to go home. Follow these instructions at home:  Take over-the-counter and prescription medicines only as told by your health care provider. Eat foods that are high in fiber, such as beans, whole grains, and fresh fruits and vegetables. This will help to keep your stools soft. Eating 1-3 prunes each day works well for many people. Drink enough fluid to keep your urine pale yellow. Keep all follow-up visits. This is important. Contact a   health care provider if: Your symptoms do not improve with treatment. Get help right away if: Your bleeding does not stop. You feel light-headed or you faint. You feel  weak. You have severe cramps in your back or abdomen. You pass large blood clots in your stool. Your symptoms are getting worse. You have chest pain or fast heartbeats. These symptoms may be an emergency. Get help right away. Call 911. Do not wait to see if the symptoms will go away. Do not drive yourself to the hospital. Summary Gastrointestinal (GI) bleeding is bleeding somewhere along the digestive tract, between the mouth and anus. GI bleeding can be caused by various problems. Treatment for this condition depends on the cause of the bleeding. Take over-the-counter and prescription medicines only as told by your health care provider. Get help right away if your bleeding increases, your symptoms are getting worse, or you have new symptoms. Keep all follow-up visits. This is important. This information is not intended to replace advice given to you by your health care provider. Make sure you discuss any questions you have with your health care provider. Document Revised: 02/14/2021 Document Reviewed: 02/14/2021 Elsevier Patient Education  2023 Elsevier Inc.  

## 2022-01-30 NOTE — Progress Notes (Signed)
Subjective:    Patient ID: Mary Moss, female    DOB: [birth year], 86 y.o.   MRN: [id number]  Chief Complaint  Patient presents with   Transitions Of Care   Today's visit was for Transitional Care Management.  The patient was discharged from [facility] on 01/22/22 with a primary diagnosis of GI bleed.   Contact with the patient and/or caregiver, by a clinical staff member, was made on 01/23/22 and was documented as a telephone encounter within the EMR.  Through chart review and discussion with the patient I have determined that management of their condition is of moderate complexity.    She went to the ED with rectal bleeding. She had hip surgery and placed on xarelto. She had an EGD and colonoscopy. They stopped her xarelto and told her to take a aspirin 81 mg for 3 weeks post-op.   She was told to avoid other NSAID's.   Denies any bleeding. Has follow up with GI to repeat EGD in 6-8 weeks.   Rectal Bleeding  The problem has been resolved. The patient is experiencing no pain.  Anemia Presents for follow-up visit. Symptoms include malaise/fatigue. There has been no confusion, leg swelling or light-headedness. Signs of blood loss that are present include hematochezia.      Review of Systems  Constitutional:  Positive for malaise/fatigue.  Gastrointestinal:  Positive for hematochezia.  Neurological:  Negative for light-headedness.  Psychiatric/Behavioral:  Negative for confusion.   All other systems reviewed and are negative.      Objective:   Physical Exam Vitals reviewed.  Constitutional:      General: She is not in acute distress.    Appearance: She is well-developed. She is obese.  HENT:     Head: Normocephalic and atraumatic.     Right Ear: Tympanic membrane normal.     Left Ear: Tympanic membrane normal.  Eyes:     Pupils: Pupils are equal, round, and reactive to light.  Neck:     Thyroid: No thyromegaly.  Cardiovascular:     Rate and Rhythm: Normal  rate and regular rhythm.     Heart sounds: Normal heart sounds. No murmur heard. Pulmonary:     Effort: Pulmonary effort is normal. No respiratory distress.     Breath sounds: Normal breath sounds. No wheezing.  Abdominal:     General: Bowel sounds are normal. There is no distension.     Palpations: Abdomen is soft.     Tenderness: There is no abdominal tenderness.  Musculoskeletal:        General: No tenderness. Normal range of motion.     Cervical back: Normal range of motion and neck supple.     Right lower leg: Edema (2+) present.     Left lower leg: Edema (1+) present.  Skin:    General: Skin is warm and dry.  Neurological:     Mental Status: She is alert and oriented to person, place, and time.     Cranial Nerves: No cranial nerve deficit.     Deep Tendon Reflexes: Reflexes are normal and symmetric.  Psychiatric:        Behavior: Behavior normal.        Thought Content: Thought content normal.        Judgment: Judgment normal.   BP (!) 117/58   Pulse 89   Temp 97.9 F (36.6 C)   Ht '5\' 8"'  (1.727 m)   Wt 206 lb (93.4 kg)   SpO2 98%  BMI 31.32 kg/m       Assessment & Plan:  BERNISHA VERMA comes in today with chief complaint of Transitions Of Care   Diagnosis and orders addressed:  1. Gastrointestinal hemorrhage, unspecified gastrointestinal hemorrhage type - CMP14+EGFR - CBC with Differential/Platelet  2. Hospital discharge follow-up - CMP14+EGFR - CBC with Differential/Platelet  3. Weakness - CMP14+EGFR - CBC with Differential/Platelet  4. History of left hip replacement - CMP14+EGFR - CBC with Differential/Platelet   Labs pending Health Maintenance reviewed Diet and exercise encouraged  Follow up plan: Keep chronic follow up   Evelina Dun, FNP

## 2022-02-02 ENCOUNTER — Telehealth (INDEPENDENT_AMBULATORY_CARE_PROVIDER_SITE_OTHER): Payer: Self-pay

## 2022-02-02 LAB — CMP14+EGFR
ALT: 15 IU/L (ref 0–32)
AST: 22 IU/L (ref 0–40)
Albumin/Globulin Ratio: 1.5 (ref 1.2–2.2)
Albumin: 3.6 g/dL (ref 3.6–4.6)
Alkaline Phosphatase: 117 IU/L (ref 44–121)
BUN/Creatinine Ratio: 17 (ref 12–28)
BUN: 15 mg/dL (ref 8–27)
Bilirubin Total: 0.2 mg/dL (ref 0.0–1.2)
CO2: 27 mmol/L (ref 20–29)
Calcium: 9.7 mg/dL (ref 8.7–10.3)
Chloride: 102 mmol/L (ref 96–106)
Creatinine, Ser: 0.88 mg/dL (ref 0.57–1.00)
Globulin, Total: 2.4 g/dL (ref 1.5–4.5)
Glucose: 108 mg/dL — ABNORMAL HIGH (ref 70–99)
Potassium: 4.1 mmol/L (ref 3.5–5.2)
Sodium: 142 mmol/L (ref 134–144)
Total Protein: 6 g/dL (ref 6.0–8.5)
eGFR: 65 mL/min/{1.73_m2} (ref >59)

## 2022-02-02 LAB — CBC WITH DIFFERENTIAL/PLATELET
Basophils Absolute: 0.1 10*3/uL (ref 0.0–0.2)
Basos: 1 %
EOS (ABSOLUTE): 0.4 10*3/uL (ref 0.0–0.4)
Eos: 5 %
Hematocrit: 26.9 % — ABNORMAL LOW (ref 34.0–46.6)
Hemoglobin: 8.8 g/dL — CL (ref 11.1–15.9)
Immature Grans (Abs): 0 10*3/uL (ref 0.0–0.1)
Immature Granulocytes: 0 %
Lymphocytes Absolute: 2 10*3/uL (ref 0.7–3.1)
Lymphs: 27 %
MCH: 29.8 pg (ref 26.6–33.0)
MCHC: 32.7 g/dL (ref 31.5–35.7)
MCV: 91 fL (ref 79–97)
Monocytes Absolute: 0.6 10*3/uL (ref 0.1–0.9)
Monocytes: 9 %
Neutrophils Absolute: 4.2 10*3/uL (ref 1.4–7.0)
Neutrophils: 58 %
Platelets: 503 10*3/uL — ABNORMAL HIGH (ref 150–450)
RBC: 2.95 x10E6/uL — ABNORMAL LOW (ref 3.77–5.28)
RDW: 12 % (ref 11.7–15.4)
WBC: 7.2 10*3/uL (ref 3.4–10.8)

## 2022-02-02 NOTE — Telephone Encounter (Signed)
Opened in error

## 2022-02-23 ENCOUNTER — Encounter (INDEPENDENT_AMBULATORY_CARE_PROVIDER_SITE_OTHER): Payer: Self-pay | Admitting: Gastroenterology

## 2022-02-23 ENCOUNTER — Ambulatory Visit (INDEPENDENT_AMBULATORY_CARE_PROVIDER_SITE_OTHER): Payer: Medicare Other | Admitting: Gastroenterology

## 2022-02-23 VITALS — BP 106/70 | HR 84 | Temp 97.7°F | Ht 68.0 in | Wt 206.8 lb

## 2022-02-23 DIAGNOSIS — K259 Gastric ulcer, unspecified as acute or chronic, without hemorrhage or perforation: Secondary | ICD-10-CM | POA: Insufficient documentation

## 2022-02-23 DIAGNOSIS — K253 Acute gastric ulcer without hemorrhage or perforation: Secondary | ICD-10-CM | POA: Diagnosis not present

## 2022-02-23 DIAGNOSIS — D649 Anemia, unspecified: Secondary | ICD-10-CM

## 2022-02-23 LAB — CBC WITH DIFFERENTIAL/PLATELET
Absolute Monocytes: 586 cells/uL (ref 200–950)
Basophils Absolute: 128 cells/uL (ref 0–200)
Basophils Relative: 2.2 %
Eosinophils Absolute: 360 cells/uL (ref 15–500)
Eosinophils Relative: 6.2 %
HCT: 34.1 % — ABNORMAL LOW (ref 35.0–45.0)
Hemoglobin: 10.9 g/dL — ABNORMAL LOW (ref 11.7–15.5)
Lymphs Abs: 1989 cells/uL (ref 850–3900)
MCH: 29.5 pg (ref 27.0–33.0)
MCHC: 32 g/dL (ref 32.0–36.0)
MCV: 92.2 fL (ref 80.0–100.0)
MPV: 10.8 fL (ref 7.5–12.5)
Monocytes Relative: 10.1 %
Neutro Abs: 2738 cells/uL (ref 1500–7800)
Neutrophils Relative %: 47.2 %
Platelets: 374 10*3/uL (ref 140–400)
RBC: 3.7 10*6/uL — ABNORMAL LOW (ref 3.80–5.10)
RDW: 11.9 % (ref 11.0–15.0)
Total Lymphocyte: 34.3 %
WBC: 5.8 10*3/uL (ref 3.8–10.8)

## 2022-02-23 NOTE — Progress Notes (Signed)
Maylon Peppers, M.D. Gastroenterology & Hepatology [facility] [address] [place], [place] [address]  Primary Care Physician: Reynold Bowen, MD [place] [id number]  I will communicate my assessment and recommendations to the referring MD via EMR.  Problems: NSAID induced gastric ulcers Erythematous duodenopathy  History of Present Illness: Mary Moss is a 86 y.o. female with history of recent hip replacement, GERD, hypertension, basal cell carcinoma and recent NSAID induced gastric ulcers, who presents for follow up of gastric ulcers.  The patient was last seen while hospitalized at [facility] in June 2023 after presenting episodes of coffee-ground emesis and Questionable hematochezia.  She underwent both EGD and colonoscopy in 01/21/2022.  EGD showed 1 cm hiatal hernia, Forrest III gastric ulcers which were biopsied and negative for H. pylori or dysplasia.  There was presence of erythematous duodenopathy.  Colonoscopy was positive for diverticulosis and nonbleeding internal hemorrhoids. H. Pylori in blood was normal. Patient was discharged home on pantoprazole 40 mg twice a day and advised to avoid NSAIDs.  She did not require any transfusion while hospitalized.  She is currently only taking Tylenol for pain. She is not taking any anticoagulant.   Patient denies any complaints at the moment. She has noticed some burping and has been having significant flatulence after starting the pantoprazole but otherwise denies having any complaints. The patient denies having any nausea, vomiting, fever, chills, hematochezia, melena, hematemesis, abdominal distention, abdominal pain, diarrhea, jaundice, pruritus or weight loss.  Last EGD: as above Last Colonoscopy: as above  Past Medical History: Past Medical History:  Diagnosis Date   Ankle fracture fx 13 yrs ago, anklw swells off and on, has cramps in ankle  also   left, to seee dr hewitt about 02-16-14   Arthritis    osteo and RA   Basal cell carcinoma 2015   generalized   Difficult intubation    GERD (gastroesophageal reflux disease)    OTC med   pt. denies   Hypertension    Pneumonia    Pre-diabetes     Past Surgical History: Past Surgical History:  Procedure Laterality Date   ABDOMINAL HYSTERECTOMY     BIOPSY  01/21/2022   Procedure: BIOPSY;  Surgeon: Montez Morita, Quillian Quince, MD;  [facility];  Service: Gastroenterology;;  gastric ulcer biopsies, gastric biopsies;   COLONOSCOPY WITH PROPOFOL N/A 01/21/2022   Procedure: COLONOSCOPY WITH PROPOFOL;  Surgeon: Harvel Quale, MD;  [facility];  Service: Gastroenterology;  Laterality: N/A;   ESOPHAGOGASTRODUODENOSCOPY (EGD) WITH PROPOFOL N/A 01/21/2022   Procedure: ESOPHAGOGASTRODUODENOSCOPY (EGD) WITH PROPOFOL;  Surgeon: Harvel Quale, MD;  [facility];  Service: Gastroenterology;  Laterality: N/A;   EYE SURGERY Bilateral    bilaterla cataract extraction with IOL   FRACTURE SURGERY Left 2001   JOINT REPLACEMENT     KNEE ARTHROSCOPY Right 02/07/2014   Procedure: RIGHT ARTHROSCOPY KNEE, WITH MEDIAL MENISCAL DEBRIDEMENT AND CHONDRAPLASTY;  Surgeon: Gearlean Alf, MD;  [facility];  Service: Orthopedics;  Laterality: Right;   ROTATOR CUFF REPAIR Right 2009   TOTAL ANKLE ARTHROPLASTY Left 12/20/2014   Procedure: LEFT TOTAL ANKLE ARTHOPLASTY WITH PERCUTANEOUS ACHILLES TENDON LENGTHENING;  Surgeon: Wylene Simmer, MD;  Location: [place];  Service: Orthopedics;  Laterality: Left;   TOTAL HIP ARTHROPLASTY Left 12/31/2021   Procedure: TOTAL HIP ARTHROPLASTY ANTERIOR APPROACH;  Surgeon: Gaynelle Arabian, MD;  [facility];  Service: Orthopedics;  Laterality: Left;  TOTAL KNEE ARTHROPLASTY Right 06/08/2016   Procedure: RIGHT TOTAL KNEE ARTHROPLASTY;  Surgeon: Gaynelle Arabian, MD;  [facility];  Service: Orthopedics;  Laterality: Right;    TOTAL SHOULDER ARTHROPLASTY Right 11/23/2019   Procedure: REVERSE TOTAL SHOULDER ARTHROPLASTY;  Surgeon: Tania Ade, MD;  [facility];  Service: Orthopedics;  Laterality: Right;    Family History: Family History  Problem Relation Age of Onset   Cancer Mother        colon   Cancer Father        lung   Cancer Sister        lung cancer-nonsmoker   Alzheimer's disease Brother 81    Social History: Social History   Tobacco Use  Smoking Status Former   Packs/day: 0.50   Years: 40.00   Total pack years: 20.00   Types: Cigarettes   Start date: 07/27/1957   Quit date: 12/15/2007   Years since quitting: 14.2  Smokeless Tobacco Never   Social History   Substance and Sexual Activity  Alcohol Use Not Currently   Comment: rare   Social History   Substance and Sexual Activity  Drug Use No    Allergies: Allergies  Allergen Reactions   Statins     Simvastatin, crestor, livalo Not able to walk because so much muscle/joint pain   Neosporin [Neomycin-Bacitracin Zn-Polymyx] Rash   Polysporin [Bacitracin-Polymyxin B] Rash    Medications: Current Outpatient Medications  Medication Sig Dispense Refill   acetaminophen (TYLENOL) 325 MG tablet Take 2 tablets (650 mg total) by mouth every 6 (six) hours as needed for mild pain (or Fever >/= 101). 15 tablet 0   Albuterol Sulfate (PROAIR RESPICLICK) 614 (90 Base) MCG/ACT AEPB 1 puff as needed Inhalation every 6 hrs     allopurinol (ZYLOPRIM) 100 MG tablet Take 100 mg by mouth daily.     clobetasol cream (TEMOVATE) 4.31 % Apply 1 application topically 2 (two) times daily as needed (vaginal irritation.).     D3-50 1.25 MG (50000 UT) capsule Take 50,000 Units by mouth every Sunday.     diphenhydramine-acetaminophen (TYLENOL PM EXTRA STRENGTH) 25-500 MG TABS tablet Take 1 tablet by mouth at bedtime as needed (sleep).     hydrochlorothiazide (HYDRODIURIL) 25 MG tablet Take 0.5 tablets (12.5 mg total) by mouth daily. (Patient taking  differently: Take 25 mg by mouth daily.) 15 tablet 2   hydroxychloroquine (PLAQUENIL) 200 MG tablet Take 400 mg by mouth daily.     lisinopril (ZESTRIL) 10 MG tablet TAKE 1 TABLET BY MOUTH EVERY DAY 90 tablet 2   pantoprazole (PROTONIX) 40 MG tablet Take 1 tablet (40 mg total) by mouth 2 (two) times daily. 60 tablet 5   No current facility-administered medications for this visit.    Review of Systems: GENERAL: negative for malaise, night sweats HEENT: No changes in hearing or vision, no nose bleeds or other nasal problems. NECK: Negative for lumps, goiter, pain and significant neck swelling RESPIRATORY: Negative for cough, wheezing CARDIOVASCULAR: Negative for chest pain, leg swelling, palpitations, orthopnea GI: SEE HPI MUSCULOSKELETAL: Negative for joint pain or swelling, back pain, and muscle pain. SKIN: Negative for lesions, rash PSYCH: Negative for sleep disturbance, mood disorder and recent psychosocial stressors. HEMATOLOGY Negative for prolonged bleeding, bruising easily, and swollen nodes. ENDOCRINE: Negative for cold or heat intolerance, polyuria, polydipsia and goiter. NEURO: negative for tremor, gait imbalance, syncope and seizures. The remainder of the review of systems is noncontributory.   Physical Exam: BP 106/70 (BP Location: Left Arm,  Patient Position: Sitting, Cuff Size: Large)   Pulse 84   Temp 97.7 F (36.5 C) (Oral)   Ht '5\' 8"'$  (1.727 m)   Wt 206 lb 12.8 oz (93.8 kg)   BMI 31.44 kg/m  GENERAL: The patient is AO x3, in no acute distress. HEENT: Head is normocephalic and atraumatic. EOMI are intact. Mouth is well hydrated and without lesions. NECK: Supple. No masses LUNGS: Clear to auscultation. No presence of rhonchi/wheezing/rales. Adequate chest expansion HEART: RRR, normal s1 and s2. ABDOMEN: Soft, nontender, no guarding, no peritoneal signs, and nondistended. BS +. No masses. EXTREMITIES: Without any cyanosis, clubbing, rash, lesions or  edema. NEUROLOGIC: AOx3, no focal motor deficit. SKIN: no jaundice, no rashes  Imaging/Labs: as above  I personally reviewed and interpreted the available labs, imaging and endoscopic files.  Impression and Plan: Mary Moss is a 86 y.o. female with history of recent hip replacement, GERD, hypertension, basal cell carcinoma and recent NSAID induced gastric ulcers, who presents for follow up of gastric ulcers.  The patient presented with acute gastric ulcers in the setting of ibuprofen and Xarelto use.  No endoscopic intervention was performed when ulcers were diagnosed as there was no high risk features for recurrent bleeding.  She has been tolerating pantoprazole twice a day adequately and she has been taking it compliantly.  I explained to her that as she is no longer taking the insulting medications, we will repeat an EGD the first week of September and assess ulcer healing.  If ulcer has healed, we will stop PPIs at that time.  Patient understood and agreed.  - Schedule EGD in September - Continue pantoprazole 40 mg twice a day - Avoid using high dose aspirin including Goody/BC powders, NSAIDs such as Aleve, ibuprofen, naproxen, Motrin, Voltaren or Advil (even the topical ones)  All questions were answered.      Harvel Quale, MD Gastroenterology and Hepatology [facility]

## 2022-02-23 NOTE — Patient Instructions (Addendum)
Schedule EGD in September Continue pantoprazole 40 mg twice a day Avoid using high dose aspirin including Goody/BC powders, NSAIDs such as Aleve, ibuprofen, naproxen, Motrin, Voltaren or Advil (even the topical ones)

## 2022-03-04 ENCOUNTER — Encounter (INDEPENDENT_AMBULATORY_CARE_PROVIDER_SITE_OTHER): Payer: Self-pay

## 2022-03-04 ENCOUNTER — Other Ambulatory Visit (INDEPENDENT_AMBULATORY_CARE_PROVIDER_SITE_OTHER): Payer: Self-pay

## 2022-03-04 DIAGNOSIS — I10 Essential (primary) hypertension: Secondary | ICD-10-CM

## 2022-04-02 NOTE — Patient Instructions (Signed)
Mary Moss  04/02/2022     '@PREFPERIOPPHARMACY'$ @   Your procedure is scheduled on  04/07/2022.   Report to [facility] at  0900  A.M.   Call this number if you have problems the morning of surgery:  [phone]   Remember:  Follow the diet instructions given to you by the office.   Use your inhaler before you come and bring your rescue inhaler with you.    Take these medicines the morning of surgery with A SIP OF WATER                                             allopurinol, protonix.     Do not wear jewelry, make-up or nail polish.  Do not wear lotions, powders, or perfumes, or deodorant.  Do not shave 48 hours prior to surgery.  Men may shave face and neck.  Do not bring valuables to the hospital.  [facility] is not responsible for any belongings or valuables.  Contacts, dentures or bridgework may not be worn into surgery.  Leave your suitcase in the car.  After surgery it may be brought to your room.  For patients admitted to the hospital, discharge time will be determined by your treatment team.  Patients discharged the day of surgery will not be allowed to drive home and must have someone with them for 24 hours.    Special instructions:   DO NOT smoke tobacco or vape for 24 hours before your procedure.   Please read over the following fact sheets that you were given. Anesthesia Post-op Instructions and Care and Recovery After Surgery      Upper Endoscopy, Adult, Care After After the procedure, it is common to have a sore throat. It is also common to have: Mild stomach pain or discomfort. Bloating. Nausea. Follow these instructions at home: The instructions below may help you care for yourself at home. Your health care provider may give you more instructions. If you have questions, ask your health care provider. If you were given a sedative during the procedure, it can affect you for several hours. Do not drive or operate machinery until your  health care provider says that it is safe. If you will be going home right after the procedure, plan to have a responsible adult: Take you home from the hospital or clinic. You will not be allowed to drive. Care for you for the time you are told. Follow instructions from your health care provider about what you may eat and drink. Return to your normal activities as told by your health care provider. Ask your health care provider what activities are safe for you. Take over-the-counter and prescription medicines only as told by your health care provider. Contact a health care provider if you: Have a sore throat that lasts longer than one day. Have trouble swallowing. Have a fever. Get help right away if you: Vomit blood or your vomit looks like coffee grounds. Have bloody, black, or tarry stools. Have a very bad sore throat or you cannot swallow. Have difficulty breathing or very bad pain in your chest or abdomen. These symptoms may be an emergency. Get help right away. Call 911. Do not wait to see if the symptoms will go away. Do not drive yourself to the hospital. Summary After the procedure, it is common  to have a sore throat, mild stomach discomfort, bloating, and nausea. If you were given a sedative during the procedure, it can affect you for several hours. Do not drive until your health care provider says that it is safe. Follow instructions from your health care provider about what you may eat and drink. Return to your normal activities as told by your health care provider. This information is not intended to replace advice given to you by your health care provider. Make sure you discuss any questions you have with your health care provider. Document Revised: 10/22/2021 Document Reviewed: 10/22/2021 Elsevier Patient Education  [place] After This sheet gives you information about how to care for yourself after your procedure. Your health  care provider may also give you more specific instructions. If you have problems or questions, contact your health care provider. What can I expect after the procedure? After the procedure, it is common to have: Tiredness. Forgetfulness about what happened after the procedure. Impaired judgment for important decisions. Nausea or vomiting. Some difficulty with balance. Follow these instructions at home: For the time period you were told by your health care provider:     Rest as needed. Do not participate in activities where you could fall or become injured. Do not drive or use machinery. Do not drink alcohol. Do not take sleeping pills or medicines that cause drowsiness. Do not make important decisions or sign legal documents. Do not take care of children on your own. Eating and drinking Follow the diet that is recommended by your health care provider. Drink enough fluid to keep your urine pale yellow. If you vomit: Drink water, juice, or soup when you can drink without vomiting. Make sure you have little or no nausea before eating solid foods. General instructions Have a responsible adult stay with you for the time you are told. It is important to have someone help care for you until you are awake and alert. Take over-the-counter and prescription medicines only as told by your health care provider. If you have sleep apnea, surgery and certain medicines can increase your risk for breathing problems. Follow instructions from your health care provider about wearing your sleep device: Anytime you are sleeping, including during daytime naps. While taking prescription pain medicines, sleeping medicines, or medicines that make you drowsy. Avoid smoking. Keep all follow-up visits as told by your health care provider. This is important. Contact a health care provider if: You keep feeling nauseous or you keep vomiting. You feel light-headed. You are still sleepy or having trouble with  balance after 24 hours. You develop a rash. You have a fever. You have redness or swelling around the IV site. Get help right away if: You have trouble breathing. You have new-onset confusion at home. Summary For several hours after your procedure, you may feel tired. You may also be forgetful and have poor judgment. Have a responsible adult stay with you for the time you are told. It is important to have someone help care for you until you are awake and alert. Rest as told. Do not drive or operate machinery. Do not drink alcohol or take sleeping pills. Get help right away if you have trouble breathing, or if you suddenly become confused. This information is not intended to replace advice given to you by your health care provider. Make sure you discuss any questions you have with your health care provider. Document Revised: 06/17/2021 Document Reviewed: 06/15/2019 Elsevier Patient Education  2023  [employer].

## 2022-04-03 ENCOUNTER — Encounter (HOSPITAL_COMMUNITY): Payer: Self-pay

## 2022-04-03 ENCOUNTER — Encounter (HOSPITAL_COMMUNITY)
Admission: RE | Admit: 2022-04-03 | Discharge: 2022-04-03 | Disposition: A | Discharge status: Home / Self Care | Payer: Medicare Other | Source: Ambulatory Visit | Attending: Gastroenterology | Admitting: Gastroenterology

## 2022-04-03 VITALS — BP 131/48 | HR 78 | Temp 97.9°F | Resp 18 | Ht 68.0 in | Wt 206.8 lb

## 2022-04-03 DIAGNOSIS — I10 Essential (primary) hypertension: Secondary | ICD-10-CM | POA: Diagnosis not present

## 2022-04-03 DIAGNOSIS — D649 Anemia, unspecified: Secondary | ICD-10-CM | POA: Diagnosis not present

## 2022-04-03 DIAGNOSIS — Z01812 Encounter for preprocedural laboratory examination: Secondary | ICD-10-CM | POA: Insufficient documentation

## 2022-04-03 LAB — BASIC METABOLIC PANEL
Anion gap: 8 (ref 5–15)
BUN: 15 mg/dL (ref 8–23)
CO2: 28 mmol/L (ref 22–32)
Calcium: 9.2 mg/dL (ref 8.9–10.3)
Chloride: 104 mmol/L (ref 98–111)
Creatinine, Ser: 1.05 mg/dL — ABNORMAL HIGH (ref 0.44–1.00)
GFR, Estimated: 53 mL/min — ABNORMAL LOW (ref >60)
Glucose, Bld: 86 mg/dL (ref 70–99)
Potassium: 3.8 mmol/L (ref 3.5–5.1)
Sodium: 140 mmol/L (ref 135–145)

## 2022-04-03 LAB — CBC WITH DIFFERENTIAL/PLATELET
Abs Immature Granulocytes: 0.01 10*3/uL (ref 0.00–0.07)
Basophils Absolute: 0.1 10*3/uL (ref 0.0–0.1)
Basophils Relative: 2 %
Eosinophils Absolute: 0.3 10*3/uL (ref 0.0–0.5)
Eosinophils Relative: 6 %
HCT: 34.8 % — ABNORMAL LOW (ref 36.0–46.0)
Hemoglobin: 10.6 g/dL — ABNORMAL LOW (ref 12.0–15.0)
Immature Granulocytes: 0 %
Lymphocytes Relative: 37 %
Lymphs Abs: 2 10*3/uL (ref 0.7–4.0)
MCH: 27.7 pg (ref 26.0–34.0)
MCHC: 30.5 g/dL (ref 30.0–36.0)
MCV: 90.9 fL (ref 80.0–100.0)
Monocytes Absolute: 0.6 10*3/uL (ref 0.1–1.0)
Monocytes Relative: 11 %
Neutro Abs: 2.4 10*3/uL (ref 1.7–7.7)
Neutrophils Relative %: 44 %
Platelets: 340 10*3/uL (ref 150–400)
RBC: 3.83 MIL/uL — ABNORMAL LOW (ref 3.87–5.11)
RDW: 13.8 % (ref 11.5–15.5)
WBC: 5.3 10*3/uL (ref 4.0–10.5)
nRBC: 0 % (ref 0.0–0.2)

## 2022-04-07 ENCOUNTER — Ambulatory Visit (HOSPITAL_COMMUNITY): Payer: Medicare Other | Admitting: Anesthesiology

## 2022-04-07 ENCOUNTER — Encounter (HOSPITAL_COMMUNITY)
Admission: RE | Disposition: A | Discharge status: Home / Self Care | Payer: Self-pay | Source: Ambulatory Visit | Attending: Gastroenterology

## 2022-04-07 ENCOUNTER — Ambulatory Visit (HOSPITAL_BASED_OUTPATIENT_CLINIC_OR_DEPARTMENT_OTHER): Payer: Medicare Other | Admitting: Anesthesiology

## 2022-04-07 ENCOUNTER — Ambulatory Visit (HOSPITAL_COMMUNITY)
Admission: RE | Admit: 2022-04-07 | Discharge: 2022-04-07 | Disposition: A | Discharge status: Home / Self Care | Payer: Medicare Other | Source: Ambulatory Visit | Attending: Gastroenterology | Admitting: Gastroenterology

## 2022-04-07 ENCOUNTER — Encounter (HOSPITAL_COMMUNITY): Payer: Self-pay | Admitting: Gastroenterology

## 2022-04-07 DIAGNOSIS — Z8711 Personal history of peptic ulcer disease: Secondary | ICD-10-CM | POA: Insufficient documentation

## 2022-04-07 DIAGNOSIS — J449 Chronic obstructive pulmonary disease, unspecified: Secondary | ICD-10-CM | POA: Diagnosis not present

## 2022-04-07 DIAGNOSIS — K219 Gastro-esophageal reflux disease without esophagitis: Secondary | ICD-10-CM | POA: Diagnosis not present

## 2022-04-07 DIAGNOSIS — K3189 Other diseases of stomach and duodenum: Secondary | ICD-10-CM | POA: Insufficient documentation

## 2022-04-07 DIAGNOSIS — K449 Diaphragmatic hernia without obstruction or gangrene: Secondary | ICD-10-CM

## 2022-04-07 DIAGNOSIS — Z791 Long term (current) use of non-steroidal anti-inflammatories (NSAID): Secondary | ICD-10-CM | POA: Insufficient documentation

## 2022-04-07 DIAGNOSIS — Z85828 Personal history of other malignant neoplasm of skin: Secondary | ICD-10-CM | POA: Insufficient documentation

## 2022-04-07 DIAGNOSIS — I10 Essential (primary) hypertension: Secondary | ICD-10-CM | POA: Insufficient documentation

## 2022-04-07 DIAGNOSIS — K253 Acute gastric ulcer without hemorrhage or perforation: Secondary | ICD-10-CM | POA: Diagnosis present

## 2022-04-07 DIAGNOSIS — Z87891 Personal history of nicotine dependence: Secondary | ICD-10-CM | POA: Diagnosis not present

## 2022-04-07 DIAGNOSIS — K259 Gastric ulcer, unspecified as acute or chronic, without hemorrhage or perforation: Secondary | ICD-10-CM

## 2022-04-07 HISTORY — PX: ESOPHAGOGASTRODUODENOSCOPY (EGD) WITH PROPOFOL: SHX5813

## 2022-04-07 SURGERY — ESOPHAGOGASTRODUODENOSCOPY (EGD) WITH PROPOFOL
Anesthesia: General

## 2022-04-07 MED ORDER — LIDOCAINE HCL (CARDIAC) PF 100 MG/5ML IV SOSY
PREFILLED_SYRINGE | INTRAVENOUS | Status: DC | PRN
  Administered 2022-04-07: 50 mg via INTRAVENOUS

## 2022-04-07 MED ORDER — LACTATED RINGERS IV SOLN
INTRAVENOUS | Status: DC
  Administered 2022-04-07: 1000 mL via INTRAVENOUS

## 2022-04-07 MED ORDER — PANTOPRAZOLE SODIUM 40 MG PO TBEC: 40.0000 mg | DELAYED_RELEASE_TABLET | Freq: Every day | ORAL | 3 refills | Status: DC

## 2022-04-07 MED ORDER — PROPOFOL 10 MG/ML IV BOLUS
INTRAVENOUS | Status: DC | PRN
  Administered 2022-04-07: 70 mg via INTRAVENOUS

## 2022-04-07 NOTE — H&P (Signed)
Mary Moss is an 86 y.o. female.   Chief Complaint: history gastric uclers HPI: Mary Moss is a 86 y.o. female with history of recent hip replacement, GERD, hypertension, basal cell carcinoma and recent NSAID induced gastric ulcers, who presents for follow up of gastric ulcers.  The patient denies having any nausea, vomiting, fever, chills, hematochezia, melena, hematemesis, abdominal distention, abdominal pain, diarrhea, jaundice, pruritus or weight loss.   Past Medical History:  Diagnosis Date   Ankle fracture fx 13 yrs ago, anklw swells off and on, has cramps in ankle also   left, to seee dr hewitt about 02-16-14   Arthritis    osteo and RA   Basal cell carcinoma 2015   generalized   Difficult intubation    GERD (gastroesophageal reflux disease)    OTC med   pt. denies   Hypertension    Pneumonia    Pre-diabetes     Past Surgical History:  Procedure Laterality Date   ABDOMINAL HYSTERECTOMY     BIOPSY  01/21/2022   Procedure: BIOPSY;  Surgeon: Montez Morita, Quillian Quince, MD;  [facility];  Service: Gastroenterology;;  gastric ulcer biopsies, gastric biopsies;   COLONOSCOPY WITH PROPOFOL N/A 01/21/2022   Procedure: COLONOSCOPY WITH PROPOFOL;  Surgeon: Harvel Quale, MD;  [facility];  Service: Gastroenterology;  Laterality: N/A;   ESOPHAGOGASTRODUODENOSCOPY (EGD) WITH PROPOFOL N/A 01/21/2022   Procedure: ESOPHAGOGASTRODUODENOSCOPY (EGD) WITH PROPOFOL;  Surgeon: Harvel Quale, MD;  [facility];  Service: Gastroenterology;  Laterality: N/A;   EYE SURGERY Bilateral    bilaterla cataract extraction with IOL   FRACTURE SURGERY Left 2001   JOINT REPLACEMENT     KNEE ARTHROSCOPY Right 02/07/2014   Procedure: RIGHT ARTHROSCOPY KNEE, WITH MEDIAL MENISCAL DEBRIDEMENT AND CHONDRAPLASTY;  Surgeon: Gearlean Alf, MD;  [facility];  Service: Orthopedics;  Laterality: Right;   ROTATOR CUFF REPAIR Right 2009   TOTAL  ANKLE ARTHROPLASTY Left 12/20/2014   Procedure: LEFT TOTAL ANKLE ARTHOPLASTY WITH PERCUTANEOUS ACHILLES TENDON LENGTHENING;  Surgeon: Wylene Simmer, MD;  Location: [place];  Service: Orthopedics;  Laterality: Left;   TOTAL HIP ARTHROPLASTY Left 12/31/2021   Procedure: TOTAL HIP ARTHROPLASTY ANTERIOR APPROACH;  Surgeon: Gaynelle Arabian, MD;  [facility];  Service: Orthopedics;  Laterality: Left;   TOTAL KNEE ARTHROPLASTY Right 06/08/2016   Procedure: RIGHT TOTAL KNEE ARTHROPLASTY;  Surgeon: Gaynelle Arabian, MD;  [facility];  Service: Orthopedics;  Laterality: Right;   TOTAL SHOULDER ARTHROPLASTY Right 11/23/2019   Procedure: REVERSE TOTAL SHOULDER ARTHROPLASTY;  Surgeon: Tania Ade, MD;  [facility];  Service: Orthopedics;  Laterality: Right;    Family History  Problem Relation Age of Onset   Cancer Mother        colon   Cancer Father        lung   Cancer Sister        lung cancer-nonsmoker   Alzheimer's disease Brother 75   Social History:  reports that she quit smoking about 14 years ago. Her smoking use included cigarettes. She started smoking about 64 years ago. She has a 20.00 pack-year smoking history. She has never used smokeless tobacco. She reports that she does not currently use alcohol. She reports that she does not use drugs.  Allergies:  Allergies  Allergen Reactions   Statins     Simvastatin, crestor, livalo Not able to walk because so much muscle/joint pain   Neosporin [Neomycin-Bacitracin Zn-Polymyx] Rash   Polysporin [Bacitracin-Polymyxin B]  Rash    Medications Prior to Admission  Medication Sig Dispense Refill   acetaminophen (TYLENOL) 325 MG tablet Take 2 tablets (650 mg total) by mouth every 6 (six) hours as needed for mild pain (or Fever >/= 101). 15 tablet 0   Albuterol Sulfate (PROAIR RESPICLICK) 592 (90 Base) MCG/ACT AEPB Inhale 1-2 puffs into the lungs every 6 (six) hours as needed (shortness of breath).     allopurinol (ZYLOPRIM) 100 MG tablet  Take 100 mg by mouth daily.     clobetasol cream (TEMOVATE) 9.24 % Apply 1 application topically 2 (two) times daily as needed (vaginal irritation.).     D3-50 1.25 MG (50000 UT) capsule Take 50,000 Units by mouth every Sunday.     diphenhydramine-acetaminophen (TYLENOL PM EXTRA STRENGTH) 25-500 MG TABS tablet Take 1 tablet by mouth at bedtime.     hydrochlorothiazide (HYDRODIURIL) 25 MG tablet Take 0.5 tablets (12.5 mg total) by mouth daily. (Patient taking differently: Take 25 mg by mouth daily.) 15 tablet 2   hydroxychloroquine (PLAQUENIL) 200 MG tablet Take 400 mg by mouth 2 (two) times daily.     lisinopril (ZESTRIL) 10 MG tablet TAKE 1 TABLET BY MOUTH EVERY DAY 90 tablet 2   Multiple Vitamins-Minerals (ALIVE HAIR, SKIN & NAILS) CHEW Chew 2 tablets by mouth daily.     pantoprazole (PROTONIX) 40 MG tablet Take 1 tablet (40 mg total) by mouth 2 (two) times daily. 60 tablet 5    No results found for this or any previous visit (from the past 48 hour(s)). No results found.  Review of Systems  All other systems reviewed and are negative.   Blood pressure (!) 161/73, temperature 98.3 F (36.8 C), temperature source Oral, resp. rate 18, SpO2 98 %. Physical Exam  GENERAL: The patient is AO x3, in no acute distress. HEENT: Head is normocephalic and atraumatic. EOMI are intact. Mouth is well hydrated and without lesions. NECK: Supple. No masses LUNGS: Clear to auscultation. No presence of rhonchi/wheezing/rales. Adequate chest expansion HEART: RRR, normal s1 and s2. ABDOMEN: Soft, nontender, no guarding, no peritoneal signs, and nondistended. BS +. No masses. EXTREMITIES: Without any cyanosis, clubbing, rash, lesions or edema. NEUROLOGIC: AOx3, no focal motor deficit. SKIN: no jaundice, no rashes  Assessment/Plan Mary Moss is a 86 y.o. female with history of recent hip replacement, GERD, hypertension, basal cell carcinoma and recent NSAID induced gastric ulcers, who presents for  follow up of gastric ulcers.  We will proceed with EGD.  Harvel Quale, MD 04/07/2022, 10:25 AM

## 2022-04-07 NOTE — Op Note (Signed)
[facility] Patient Name: Mary Moss Procedure Date: 04/07/2022 12:16 PM MRN: [id number] Date of Birth: 04-Jul-1936 Attending MD: Maylon Peppers ,  CSN: [id number] Age: 86 Admit Type: Outpatient Procedure:                Upper GI endoscopy Indications:              Follow-up of acute gastric ulcer Providers:                Maylon Peppers, Janeece Riggers, RN, Raphael Gibney,                            Technician Referring MD:              Medicines:                Monitored Anesthesia Care Complications:            No immediate complications. Estimated Blood Loss:     Estimated blood loss: none. Procedure:                Pre-Anesthesia Assessment:                           - Prior to the procedure, a History and Physical                            was performed, and patient medications, allergies                            and sensitivities were reviewed. The patient's                            tolerance of previous anesthesia was reviewed.                           - The risks and benefits of the procedure and the                            sedation options and risks were discussed with the                            patient. All questions were answered and informed                            consent was obtained.                           - ASA Grade Assessment: II - A patient with mild                            systemic disease.                           After obtaining informed consent, the endoscope was                            passed under direct vision. Throughout the  procedure, the patient's blood pressure, pulse, and                            oxygen saturations were monitored continuously. The                            [id number]-H190 ([id number]) scope was introduced through the                            mouth, and advanced to the second part of duodenum.                            The upper GI endoscopy was accomplished without                             difficulty. The patient tolerated the procedure                            well. Scope In: 12:40:41 PM Scope Out: 12:42:12 PM Total Procedure Duration: 0 hours 1 minute 31 seconds  Findings:      A 1 cm hiatal hernia was present.      A small scar was found in the gastric antrum. The scar tissue was       healthy in appearance.      The examined duodenum was normal. Impression:               - 1 cm hiatal hernia.                           - Scar in the gastric antrum.                           - Normal examined duodenum.                           - No specimens collected. Moderate Sedation:      Per Anesthesia Care Recommendation:           - Discharge patient to home (ambulatory).                           - Resume previous diet.                           - Use Protonix (pantoprazole) 40 mg PO daily. Procedure Code(s):        --- Professional ---                           [phone], Esophagogastroduodenoscopy, flexible,                            transoral; diagnostic, including collection of                            specimen(s) by brushing or washing, when performed                            (  separate procedure) Diagnosis Code(s):        --- Professional ---                           K44.9, Diaphragmatic hernia without obstruction or                            gangrene                           K31.89, Other diseases of stomach and duodenum                           K25.3, Acute gastric ulcer without hemorrhage or                            perforation CPT copyright 2019 American Medical Association. All rights reserved. The codes documented in this report are preliminary and upon coder review may  be revised to meet current compliance requirements. Maylon Peppers, MD Maylon Peppers,  04/07/2022 12:54:08 PM This report has been signed electronically. Number of Addenda: 0

## 2022-04-07 NOTE — Transfer of Care (Signed)
Immediate Anesthesia Transfer of Care Note  Patient: Mary Moss  Procedure(s) Performed: ESOPHAGOGASTRODUODENOSCOPY (EGD) WITH PROPOFOL  Patient Location: Short Stay  Anesthesia Type:General  Level of Consciousness: awake, alert , oriented and patient cooperative  Airway & Oxygen Therapy: Patient Spontanous Breathing  Post-op Assessment: Report given to RN, Post -op Vital signs reviewed and stable and Patient moving all extremities  Post vital signs: Reviewed and stable  Last Vitals:  Vitals Value Taken Time  BP    Temp    Pulse    Resp    SpO2      Last Pain:  Vitals:   04/07/22 1236  TempSrc:   PainSc: 0-No pain      Patients Stated Pain Goal: 7 (16/10/96 0454)  Complications: No notable events documented.

## 2022-04-07 NOTE — Anesthesia Preprocedure Evaluation (Signed)
Anesthesia Evaluation  Patient identified by MRN, date of birth, ID band Patient awake    Reviewed: Allergy & Precautions, NPO status , Patient's Chart, lab work & pertinent test results  History of Anesthesia Complications (+) DIFFICULT AIRWAY and history of anesthetic complications  Airway Mallampati: II  TM Distance: >3 FB Neck ROM: Full    Dental  (+) Dental Advisory Given, Caps   Pulmonary pneumonia, neg COPD,  COPD inhaler, former smoker,    Pulmonary exam normal breath sounds clear to auscultation       Cardiovascular hypertension, Pt. on medications Normal cardiovascular exam Rhythm:Regular Rate:Normal     Neuro/Psych PSYCHIATRIC DISORDERS Anxiety negative neurological ROS     GI/Hepatic Neg liver ROS, PUD, GERD  Medicated and Controlled,  Endo/Other  negative endocrine ROS  Renal/GU negative Renal ROS  negative genitourinary   Musculoskeletal  (+) Arthritis , Osteoarthritis and Rheumatoid disorders,    Abdominal   Peds negative pediatric ROS (+)  Hematology  (+) Blood dyscrasia, anemia ,   Anesthesia Other Findings   Reproductive/Obstetrics negative OB ROS                            Anesthesia Physical Anesthesia Plan  ASA: 2  Anesthesia Plan: General   Post-op Pain Management: Minimal or no pain anticipated   Induction: Intravenous  PONV Risk Score and Plan: Propofol infusion  Airway Management Planned: Nasal Cannula and Natural Airway  Additional Equipment:   Intra-op Plan:   Post-operative Plan:   Informed Consent: I have reviewed the patients History and Physical, chart, labs and discussed the procedure including the risks, benefits and alternatives for the proposed anesthesia with the patient or authorized representative who has indicated his/her understanding and acceptance.     Dental advisory given  Plan Discussed with: CRNA and Surgeon  Anesthesia  Plan Comments:         Anesthesia Quick Evaluation

## 2022-04-07 NOTE — Discharge Instructions (Signed)
You are being discharged to home.  Resume your previous diet.  Take Protonix (pantoprazole) 40 mg by mouth once a day.

## 2022-04-07 NOTE — Anesthesia Postprocedure Evaluation (Signed)
Anesthesia Post Note  Patient: Mary Moss  Procedure(s) Performed: ESOPHAGOGASTRODUODENOSCOPY (EGD) WITH PROPOFOL  Patient location during evaluation: Phase II Anesthesia Type: General Level of consciousness: awake and alert and oriented Pain management: pain level controlled Vital Signs Assessment: post-procedure vital signs reviewed and stable Respiratory status: spontaneous breathing, nonlabored ventilation and respiratory function stable Cardiovascular status: blood pressure returned to baseline and stable Postop Assessment: no apparent nausea or vomiting Anesthetic complications: no   No notable events documented.   Last Vitals:  Vitals:   04/07/22 1021 04/07/22 1244  BP: (!) 161/73 (!) 161/73  Pulse:  88  Resp: 18 20  Temp: 36.8 C 36.6 C  SpO2: 98% 99%    Last Pain:  Vitals:   04/07/22 1244  TempSrc: Oral  PainSc: 0-No pain                 Suetta Hoffmeister C Deston Bilyeu

## 2022-04-14 ENCOUNTER — Encounter (HOSPITAL_COMMUNITY): Payer: Self-pay | Admitting: Gastroenterology

## 2022-05-26 ENCOUNTER — Other Ambulatory Visit (HOSPITAL_COMMUNITY): Payer: Self-pay | Admitting: Family Medicine

## 2022-05-26 ENCOUNTER — Ambulatory Visit (HOSPITAL_COMMUNITY)
Admission: RE | Admit: 2022-05-26 | Discharge: 2022-05-26 | Disposition: A | Discharge status: Home / Self Care | Payer: Medicare Other | Source: Ambulatory Visit | Attending: Cardiology | Admitting: Cardiology

## 2022-05-26 DIAGNOSIS — R600 Localized edema: Secondary | ICD-10-CM | POA: Diagnosis present

## 2022-06-07 ENCOUNTER — Other Ambulatory Visit: Payer: Self-pay | Admitting: Family

## 2022-06-07 DIAGNOSIS — I10 Essential (primary) hypertension: Secondary | ICD-10-CM

## 2022-07-29 DIAGNOSIS — M0579 Rheumatoid arthritis with rheumatoid factor of multiple sites without organ or systems involvement: Secondary | ICD-10-CM | POA: Diagnosis not present

## 2022-08-25 DIAGNOSIS — L308 Other specified dermatitis: Secondary | ICD-10-CM | POA: Diagnosis not present

## 2022-08-25 DIAGNOSIS — L400 Psoriasis vulgaris: Secondary | ICD-10-CM | POA: Diagnosis not present

## 2022-08-25 DIAGNOSIS — B078 Other viral warts: Secondary | ICD-10-CM | POA: Diagnosis not present

## 2022-08-25 DIAGNOSIS — Z85828 Personal history of other malignant neoplasm of skin: Secondary | ICD-10-CM | POA: Diagnosis not present

## 2022-08-25 DIAGNOSIS — L570 Actinic keratosis: Secondary | ICD-10-CM | POA: Diagnosis not present

## 2022-08-25 DIAGNOSIS — L111 Transient acantholytic dermatosis [Grover]: Secondary | ICD-10-CM | POA: Diagnosis not present

## 2022-09-03 DIAGNOSIS — D649 Anemia, unspecified: Secondary | ICD-10-CM | POA: Diagnosis not present

## 2022-09-03 DIAGNOSIS — Z800 Family history of malignant neoplasm of digestive organs: Secondary | ICD-10-CM | POA: Diagnosis not present

## 2022-09-03 DIAGNOSIS — E785 Hyperlipidemia, unspecified: Secondary | ICD-10-CM | POA: Diagnosis not present

## 2022-09-03 DIAGNOSIS — E669 Obesity, unspecified: Secondary | ICD-10-CM | POA: Diagnosis not present

## 2022-09-03 DIAGNOSIS — M8580 Other specified disorders of bone density and structure, unspecified site: Secondary | ICD-10-CM | POA: Diagnosis not present

## 2022-09-03 DIAGNOSIS — Z96642 Presence of left artificial hip joint: Secondary | ICD-10-CM | POA: Diagnosis not present

## 2022-09-03 DIAGNOSIS — K254 Chronic or unspecified gastric ulcer with hemorrhage: Secondary | ICD-10-CM | POA: Diagnosis not present

## 2022-09-03 DIAGNOSIS — R7309 Other abnormal glucose: Secondary | ICD-10-CM | POA: Diagnosis not present

## 2022-09-03 DIAGNOSIS — R768 Other specified abnormal immunological findings in serum: Secondary | ICD-10-CM | POA: Diagnosis not present

## 2022-09-03 DIAGNOSIS — N1831 Chronic kidney disease, stage 3a: Secondary | ICD-10-CM | POA: Diagnosis not present

## 2022-09-03 DIAGNOSIS — I129 Hypertensive chronic kidney disease with stage 1 through stage 4 chronic kidney disease, or unspecified chronic kidney disease: Secondary | ICD-10-CM | POA: Diagnosis not present

## 2022-09-03 DIAGNOSIS — Z23 Encounter for immunization: Secondary | ICD-10-CM | POA: Diagnosis not present

## 2022-09-04 ENCOUNTER — Other Ambulatory Visit: Payer: Self-pay | Admitting: Family

## 2022-09-04 DIAGNOSIS — I10 Essential (primary) hypertension: Secondary | ICD-10-CM

## 2022-09-23 DIAGNOSIS — R5383 Other fatigue: Secondary | ICD-10-CM | POA: Diagnosis not present

## 2022-09-23 DIAGNOSIS — M0579 Rheumatoid arthritis with rheumatoid factor of multiple sites without organ or systems involvement: Secondary | ICD-10-CM | POA: Diagnosis not present

## 2022-09-23 DIAGNOSIS — Z79899 Other long term (current) drug therapy: Secondary | ICD-10-CM | POA: Diagnosis not present

## 2022-09-29 ENCOUNTER — Other Ambulatory Visit: Payer: Self-pay | Admitting: Family

## 2022-09-29 DIAGNOSIS — I10 Essential (primary) hypertension: Secondary | ICD-10-CM

## 2022-09-29 NOTE — Telephone Encounter (Signed)
[facility] NTBS 30 days given 09/04/22

## 2022-09-29 NOTE — Telephone Encounter (Signed)
Spoke with patient about her being past due for an appt and needing to schedule an appt with PCP before she could get anymore refills. Patient voiced understanding and said that she didn't need an appt because she has plenty of medicine.

## 2022-10-03 ENCOUNTER — Other Ambulatory Visit: Payer: Self-pay | Admitting: Family

## 2022-10-03 DIAGNOSIS — I10 Essential (primary) hypertension: Secondary | ICD-10-CM

## 2022-11-16 DIAGNOSIS — D485 Neoplasm of uncertain behavior of skin: Secondary | ICD-10-CM | POA: Diagnosis not present

## 2022-11-16 DIAGNOSIS — D0471 Carcinoma in situ of skin of right lower limb, including hip: Secondary | ICD-10-CM | POA: Diagnosis not present

## 2022-11-16 DIAGNOSIS — L309 Dermatitis, unspecified: Secondary | ICD-10-CM | POA: Diagnosis not present

## 2022-11-16 DIAGNOSIS — C44712 Basal cell carcinoma of skin of right lower limb, including hip: Secondary | ICD-10-CM | POA: Diagnosis not present

## 2022-11-16 DIAGNOSIS — L570 Actinic keratosis: Secondary | ICD-10-CM | POA: Diagnosis not present

## 2022-11-16 DIAGNOSIS — D1801 Hemangioma of skin and subcutaneous tissue: Secondary | ICD-10-CM | POA: Diagnosis not present

## 2022-11-16 DIAGNOSIS — L400 Psoriasis vulgaris: Secondary | ICD-10-CM | POA: Diagnosis not present

## 2022-11-16 DIAGNOSIS — L853 Xerosis cutis: Secondary | ICD-10-CM | POA: Diagnosis not present

## 2022-11-16 DIAGNOSIS — L821 Other seborrheic keratosis: Secondary | ICD-10-CM | POA: Diagnosis not present

## 2022-11-16 DIAGNOSIS — L814 Other melanin hyperpigmentation: Secondary | ICD-10-CM | POA: Diagnosis not present

## 2022-11-16 DIAGNOSIS — L820 Inflamed seborrheic keratosis: Secondary | ICD-10-CM | POA: Diagnosis not present

## 2022-11-16 DIAGNOSIS — Z85828 Personal history of other malignant neoplasm of skin: Secondary | ICD-10-CM | POA: Diagnosis not present

## 2022-11-18 DIAGNOSIS — M0579 Rheumatoid arthritis with rheumatoid factor of multiple sites without organ or systems involvement: Secondary | ICD-10-CM | POA: Diagnosis not present

## 2023-01-13 DIAGNOSIS — Z79899 Other long term (current) drug therapy: Secondary | ICD-10-CM | POA: Diagnosis not present

## 2023-01-13 DIAGNOSIS — R5383 Other fatigue: Secondary | ICD-10-CM | POA: Diagnosis not present

## 2023-01-13 DIAGNOSIS — Z111 Encounter for screening for respiratory tuberculosis: Secondary | ICD-10-CM | POA: Diagnosis not present

## 2023-01-13 DIAGNOSIS — M0579 Rheumatoid arthritis with rheumatoid factor of multiple sites without organ or systems involvement: Secondary | ICD-10-CM | POA: Diagnosis not present

## 2023-01-14 DIAGNOSIS — L409 Psoriasis, unspecified: Secondary | ICD-10-CM | POA: Diagnosis not present

## 2023-01-14 DIAGNOSIS — Z79899 Other long term (current) drug therapy: Secondary | ICD-10-CM | POA: Diagnosis not present

## 2023-01-14 DIAGNOSIS — M1A09X0 Idiopathic chronic gout, multiple sites, without tophus (tophi): Secondary | ICD-10-CM | POA: Diagnosis not present

## 2023-01-14 DIAGNOSIS — Z6832 Body mass index (BMI) 32.0-32.9, adult: Secondary | ICD-10-CM | POA: Diagnosis not present

## 2023-01-14 DIAGNOSIS — M1991 Primary osteoarthritis, unspecified site: Secondary | ICD-10-CM | POA: Diagnosis not present

## 2023-01-14 DIAGNOSIS — E669 Obesity, unspecified: Secondary | ICD-10-CM | POA: Diagnosis not present

## 2023-01-14 DIAGNOSIS — M0579 Rheumatoid arthritis with rheumatoid factor of multiple sites without organ or systems involvement: Secondary | ICD-10-CM | POA: Diagnosis not present

## 2023-03-10 DIAGNOSIS — M0579 Rheumatoid arthritis with rheumatoid factor of multiple sites without organ or systems involvement: Secondary | ICD-10-CM | POA: Diagnosis not present

## 2023-03-11 DIAGNOSIS — E785 Hyperlipidemia, unspecified: Secondary | ICD-10-CM | POA: Diagnosis not present

## 2023-03-11 DIAGNOSIS — E559 Vitamin D deficiency, unspecified: Secondary | ICD-10-CM | POA: Diagnosis not present

## 2023-03-11 DIAGNOSIS — N1831 Chronic kidney disease, stage 3a: Secondary | ICD-10-CM | POA: Diagnosis not present

## 2023-03-11 DIAGNOSIS — I129 Hypertensive chronic kidney disease with stage 1 through stage 4 chronic kidney disease, or unspecified chronic kidney disease: Secondary | ICD-10-CM | POA: Diagnosis not present

## 2023-03-11 DIAGNOSIS — R7309 Other abnormal glucose: Secondary | ICD-10-CM | POA: Diagnosis not present

## 2023-03-16 DIAGNOSIS — Z1231 Encounter for screening mammogram for malignant neoplasm of breast: Secondary | ICD-10-CM | POA: Diagnosis not present

## 2023-03-18 DIAGNOSIS — I129 Hypertensive chronic kidney disease with stage 1 through stage 4 chronic kidney disease, or unspecified chronic kidney disease: Secondary | ICD-10-CM | POA: Diagnosis not present

## 2023-03-18 DIAGNOSIS — R768 Other specified abnormal immunological findings in serum: Secondary | ICD-10-CM | POA: Diagnosis not present

## 2023-03-18 DIAGNOSIS — Z1331 Encounter for screening for depression: Secondary | ICD-10-CM | POA: Diagnosis not present

## 2023-03-18 DIAGNOSIS — K254 Chronic or unspecified gastric ulcer with hemorrhage: Secondary | ICD-10-CM | POA: Diagnosis not present

## 2023-03-18 DIAGNOSIS — M8580 Other specified disorders of bone density and structure, unspecified site: Secondary | ICD-10-CM | POA: Diagnosis not present

## 2023-03-18 DIAGNOSIS — E669 Obesity, unspecified: Secondary | ICD-10-CM | POA: Diagnosis not present

## 2023-03-18 DIAGNOSIS — Z1339 Encounter for screening examination for other mental health and behavioral disorders: Secondary | ICD-10-CM | POA: Diagnosis not present

## 2023-03-18 DIAGNOSIS — R82998 Other abnormal findings in urine: Secondary | ICD-10-CM | POA: Diagnosis not present

## 2023-03-18 DIAGNOSIS — E785 Hyperlipidemia, unspecified: Secondary | ICD-10-CM | POA: Diagnosis not present

## 2023-03-18 DIAGNOSIS — Z0000 Encounter for general adult medical examination without abnormal findings: Secondary | ICD-10-CM | POA: Diagnosis not present

## 2023-03-18 DIAGNOSIS — N1831 Chronic kidney disease, stage 3a: Secondary | ICD-10-CM | POA: Diagnosis not present

## 2023-03-18 DIAGNOSIS — Z13828 Encounter for screening for other musculoskeletal disorder: Secondary | ICD-10-CM | POA: Diagnosis not present

## 2023-03-18 DIAGNOSIS — Z96642 Presence of left artificial hip joint: Secondary | ICD-10-CM | POA: Diagnosis not present

## 2023-03-18 DIAGNOSIS — D649 Anemia, unspecified: Secondary | ICD-10-CM | POA: Diagnosis not present

## 2023-03-18 DIAGNOSIS — Z800 Family history of malignant neoplasm of digestive organs: Secondary | ICD-10-CM | POA: Diagnosis not present

## 2023-05-06 DIAGNOSIS — M0579 Rheumatoid arthritis with rheumatoid factor of multiple sites without organ or systems involvement: Secondary | ICD-10-CM | POA: Diagnosis not present

## 2023-05-06 DIAGNOSIS — L280 Lichen simplex chronicus: Secondary | ICD-10-CM | POA: Diagnosis not present

## 2023-05-06 DIAGNOSIS — Z01419 Encounter for gynecological examination (general) (routine) without abnormal findings: Secondary | ICD-10-CM | POA: Diagnosis not present

## 2023-05-24 DIAGNOSIS — L820 Inflamed seborrheic keratosis: Secondary | ICD-10-CM | POA: Diagnosis not present

## 2023-05-24 DIAGNOSIS — Z85828 Personal history of other malignant neoplasm of skin: Secondary | ICD-10-CM | POA: Diagnosis not present

## 2023-05-24 DIAGNOSIS — L814 Other melanin hyperpigmentation: Secondary | ICD-10-CM | POA: Diagnosis not present

## 2023-05-24 DIAGNOSIS — C44321 Squamous cell carcinoma of skin of nose: Secondary | ICD-10-CM | POA: Diagnosis not present

## 2023-05-24 DIAGNOSIS — C44619 Basal cell carcinoma of skin of left upper limb, including shoulder: Secondary | ICD-10-CM | POA: Diagnosis not present

## 2023-05-24 DIAGNOSIS — L570 Actinic keratosis: Secondary | ICD-10-CM | POA: Diagnosis not present

## 2023-05-24 DIAGNOSIS — L300 Nummular dermatitis: Secondary | ICD-10-CM | POA: Diagnosis not present

## 2023-05-24 DIAGNOSIS — L821 Other seborrheic keratosis: Secondary | ICD-10-CM | POA: Diagnosis not present

## 2023-07-06 DIAGNOSIS — Z79899 Other long term (current) drug therapy: Secondary | ICD-10-CM | POA: Diagnosis not present

## 2023-07-06 DIAGNOSIS — M0579 Rheumatoid arthritis with rheumatoid factor of multiple sites without organ or systems involvement: Secondary | ICD-10-CM | POA: Diagnosis not present

## 2023-07-15 DIAGNOSIS — E669 Obesity, unspecified: Secondary | ICD-10-CM | POA: Diagnosis not present

## 2023-07-15 DIAGNOSIS — M1991 Primary osteoarthritis, unspecified site: Secondary | ICD-10-CM | POA: Diagnosis not present

## 2023-07-15 DIAGNOSIS — M5459 Other low back pain: Secondary | ICD-10-CM | POA: Diagnosis not present

## 2023-07-15 DIAGNOSIS — Z6833 Body mass index (BMI) 33.0-33.9, adult: Secondary | ICD-10-CM | POA: Diagnosis not present

## 2023-07-15 DIAGNOSIS — M0579 Rheumatoid arthritis with rheumatoid factor of multiple sites without organ or systems involvement: Secondary | ICD-10-CM | POA: Diagnosis not present

## 2023-07-15 DIAGNOSIS — M1A09X0 Idiopathic chronic gout, multiple sites, without tophus (tophi): Secondary | ICD-10-CM | POA: Diagnosis not present

## 2023-07-15 DIAGNOSIS — Z79899 Other long term (current) drug therapy: Secondary | ICD-10-CM | POA: Diagnosis not present

## 2023-07-15 DIAGNOSIS — L409 Psoriasis, unspecified: Secondary | ICD-10-CM | POA: Diagnosis not present

## 2023-08-09 ENCOUNTER — Other Ambulatory Visit (INDEPENDENT_AMBULATORY_CARE_PROVIDER_SITE_OTHER): Payer: Self-pay | Admitting: Gastroenterology

## 2023-08-19 DIAGNOSIS — M25562 Pain in left knee: Secondary | ICD-10-CM | POA: Diagnosis not present

## 2023-08-19 DIAGNOSIS — M79662 Pain in left lower leg: Secondary | ICD-10-CM | POA: Diagnosis not present

## 2023-08-31 DIAGNOSIS — M0579 Rheumatoid arthritis with rheumatoid factor of multiple sites without organ or systems involvement: Secondary | ICD-10-CM | POA: Diagnosis not present

## 2023-08-31 DIAGNOSIS — Z79899 Other long term (current) drug therapy: Secondary | ICD-10-CM | POA: Diagnosis not present

## 2023-08-31 DIAGNOSIS — R5383 Other fatigue: Secondary | ICD-10-CM | POA: Diagnosis not present

## 2023-09-02 DIAGNOSIS — M545 Low back pain, unspecified: Secondary | ICD-10-CM | POA: Diagnosis not present

## 2023-09-02 DIAGNOSIS — R2689 Other abnormalities of gait and mobility: Secondary | ICD-10-CM | POA: Diagnosis not present

## 2023-09-07 DIAGNOSIS — R2689 Other abnormalities of gait and mobility: Secondary | ICD-10-CM | POA: Diagnosis not present

## 2023-09-07 DIAGNOSIS — M545 Low back pain, unspecified: Secondary | ICD-10-CM | POA: Diagnosis not present

## 2023-09-15 ENCOUNTER — Inpatient Hospital Stay (HOSPITAL_COMMUNITY)
Admission: EM | Admit: 2023-09-15 | Discharge: 2023-09-22 | DRG: 193 | Disposition: A | Discharge status: Skilled Nursing Facility | Payer: HMO | Attending: Family Medicine | Admitting: Family Medicine

## 2023-09-15 ENCOUNTER — Other Ambulatory Visit: Payer: Self-pay

## 2023-09-15 ENCOUNTER — Emergency Department (HOSPITAL_COMMUNITY): Payer: HMO

## 2023-09-15 ENCOUNTER — Encounter (HOSPITAL_COMMUNITY): Payer: Self-pay

## 2023-09-15 DIAGNOSIS — K5900 Constipation, unspecified: Secondary | ICD-10-CM | POA: Diagnosis not present

## 2023-09-15 DIAGNOSIS — Z87891 Personal history of nicotine dependence: Secondary | ICD-10-CM

## 2023-09-15 DIAGNOSIS — I4891 Unspecified atrial fibrillation: Secondary | ICD-10-CM | POA: Diagnosis not present

## 2023-09-15 DIAGNOSIS — Z8711 Personal history of peptic ulcer disease: Secondary | ICD-10-CM

## 2023-09-15 DIAGNOSIS — Z87448 Personal history of other diseases of urinary system: Secondary | ICD-10-CM

## 2023-09-15 DIAGNOSIS — D696 Thrombocytopenia, unspecified: Secondary | ICD-10-CM | POA: Diagnosis not present

## 2023-09-15 DIAGNOSIS — I480 Paroxysmal atrial fibrillation: Secondary | ICD-10-CM | POA: Diagnosis present

## 2023-09-15 DIAGNOSIS — Z800 Family history of malignant neoplasm of digestive organs: Secondary | ICD-10-CM

## 2023-09-15 DIAGNOSIS — J9601 Acute respiratory failure with hypoxia: Secondary | ICD-10-CM | POA: Insufficient documentation

## 2023-09-15 DIAGNOSIS — J1001 Influenza due to other identified influenza virus with the same other identified influenza virus pneumonia: Secondary | ICD-10-CM | POA: Diagnosis not present

## 2023-09-15 DIAGNOSIS — D6959 Other secondary thrombocytopenia: Secondary | ICD-10-CM | POA: Diagnosis not present

## 2023-09-15 DIAGNOSIS — J111 Influenza due to unidentified influenza virus with other respiratory manifestations: Secondary | ICD-10-CM | POA: Diagnosis not present

## 2023-09-15 DIAGNOSIS — K219 Gastro-esophageal reflux disease without esophagitis: Secondary | ICD-10-CM | POA: Diagnosis not present

## 2023-09-15 DIAGNOSIS — E669 Obesity, unspecified: Secondary | ICD-10-CM | POA: Diagnosis not present

## 2023-09-15 DIAGNOSIS — E559 Vitamin D deficiency, unspecified: Secondary | ICD-10-CM | POA: Diagnosis not present

## 2023-09-15 DIAGNOSIS — I4892 Unspecified atrial flutter: Secondary | ICD-10-CM | POA: Diagnosis not present

## 2023-09-15 DIAGNOSIS — M059 Rheumatoid arthritis with rheumatoid factor, unspecified: Secondary | ICD-10-CM | POA: Diagnosis present

## 2023-09-15 DIAGNOSIS — Z96611 Presence of right artificial shoulder joint: Secondary | ICD-10-CM | POA: Diagnosis present

## 2023-09-15 DIAGNOSIS — M109 Gout, unspecified: Secondary | ICD-10-CM | POA: Diagnosis present

## 2023-09-15 DIAGNOSIS — R278 Other lack of coordination: Secondary | ICD-10-CM | POA: Diagnosis not present

## 2023-09-15 DIAGNOSIS — Z85828 Personal history of other malignant neoplasm of skin: Secondary | ICD-10-CM

## 2023-09-15 DIAGNOSIS — Z888 Allergy status to other drugs, medicaments and biological substances status: Secondary | ICD-10-CM

## 2023-09-15 DIAGNOSIS — Z6831 Body mass index (BMI) 31.0-31.9, adult: Secondary | ICD-10-CM | POA: Diagnosis not present

## 2023-09-15 DIAGNOSIS — R6510 Systemic inflammatory response syndrome (SIRS) of non-infectious origin without acute organ dysfunction: Secondary | ICD-10-CM | POA: Diagnosis not present

## 2023-09-15 DIAGNOSIS — Z79899 Other long term (current) drug therapy: Secondary | ICD-10-CM

## 2023-09-15 DIAGNOSIS — F411 Generalized anxiety disorder: Secondary | ICD-10-CM | POA: Diagnosis not present

## 2023-09-15 DIAGNOSIS — R918 Other nonspecific abnormal finding of lung field: Secondary | ICD-10-CM | POA: Diagnosis not present

## 2023-09-15 DIAGNOSIS — R0902 Hypoxemia: Principal | ICD-10-CM

## 2023-09-15 DIAGNOSIS — I10 Essential (primary) hypertension: Secondary | ICD-10-CM | POA: Diagnosis not present

## 2023-09-15 DIAGNOSIS — R110 Nausea: Secondary | ICD-10-CM | POA: Diagnosis present

## 2023-09-15 DIAGNOSIS — J101 Influenza due to other identified influenza virus with other respiratory manifestations: Secondary | ICD-10-CM | POA: Diagnosis not present

## 2023-09-15 DIAGNOSIS — Z471 Aftercare following joint replacement surgery: Secondary | ICD-10-CM | POA: Diagnosis not present

## 2023-09-15 DIAGNOSIS — N183 Chronic kidney disease, stage 3 unspecified: Secondary | ICD-10-CM | POA: Diagnosis present

## 2023-09-15 DIAGNOSIS — M6281 Muscle weakness (generalized): Secondary | ICD-10-CM | POA: Diagnosis not present

## 2023-09-15 DIAGNOSIS — J9811 Atelectasis: Secondary | ICD-10-CM | POA: Diagnosis not present

## 2023-09-15 DIAGNOSIS — Z1152 Encounter for screening for COVID-19: Secondary | ICD-10-CM | POA: Diagnosis not present

## 2023-09-15 DIAGNOSIS — Z881 Allergy status to other antibiotic agents status: Secondary | ICD-10-CM

## 2023-09-15 DIAGNOSIS — R0602 Shortness of breath: Secondary | ICD-10-CM | POA: Diagnosis not present

## 2023-09-15 DIAGNOSIS — Z8701 Personal history of pneumonia (recurrent): Secondary | ICD-10-CM

## 2023-09-15 DIAGNOSIS — R1319 Other dysphagia: Secondary | ICD-10-CM | POA: Diagnosis not present

## 2023-09-15 DIAGNOSIS — E785 Hyperlipidemia, unspecified: Secondary | ICD-10-CM | POA: Diagnosis present

## 2023-09-15 DIAGNOSIS — Z96651 Presence of right artificial knee joint: Secondary | ICD-10-CM | POA: Diagnosis present

## 2023-09-15 DIAGNOSIS — Z96642 Presence of left artificial hip joint: Secondary | ICD-10-CM | POA: Diagnosis present

## 2023-09-15 DIAGNOSIS — Z66 Do not resuscitate: Secondary | ICD-10-CM | POA: Diagnosis not present

## 2023-09-15 DIAGNOSIS — Z820 Family history of epilepsy and other diseases of the nervous system: Secondary | ICD-10-CM

## 2023-09-15 DIAGNOSIS — R262 Difficulty in walking, not elsewhere classified: Secondary | ICD-10-CM | POA: Diagnosis not present

## 2023-09-15 DIAGNOSIS — Z801 Family history of malignant neoplasm of trachea, bronchus and lung: Secondary | ICD-10-CM

## 2023-09-15 DIAGNOSIS — Z90710 Acquired absence of both cervix and uterus: Secondary | ICD-10-CM

## 2023-09-15 LAB — COMPREHENSIVE METABOLIC PANEL
ALT: 24 U/L (ref 0–44)
AST: 34 U/L (ref 15–41)
Albumin: 3.5 g/dL (ref 3.5–5.0)
Alkaline Phosphatase: 76 U/L (ref 38–126)
Anion gap: 14 (ref 5–15)
BUN: 19 mg/dL (ref 8–23)
CO2: 24 mmol/L (ref 22–32)
Calcium: 8.8 mg/dL — ABNORMAL LOW (ref 8.9–10.3)
Chloride: 97 mmol/L — ABNORMAL LOW (ref 98–111)
Creatinine, Ser: 0.94 mg/dL (ref 0.44–1.00)
GFR, Estimated: 59 mL/min — ABNORMAL LOW (ref >60)
Glucose, Bld: 108 mg/dL — ABNORMAL HIGH (ref 70–99)
Potassium: 3.8 mmol/L (ref 3.5–5.1)
Sodium: 135 mmol/L (ref 135–145)
Total Bilirubin: 1.1 mg/dL (ref 0.0–1.2)
Total Protein: 7.1 g/dL (ref 6.5–8.1)

## 2023-09-15 LAB — CBC WITH DIFFERENTIAL/PLATELET
Abs Immature Granulocytes: 0.03 10*3/uL (ref 0.00–0.07)
Basophils Absolute: 0 10*3/uL (ref 0.0–0.1)
Basophils Relative: 0 %
Eosinophils Absolute: 0 10*3/uL (ref 0.0–0.5)
Eosinophils Relative: 0 %
HCT: 45.9 % (ref 36.0–46.0)
Hemoglobin: 14.8 g/dL (ref 12.0–15.0)
Immature Granulocytes: 0 %
Lymphocytes Relative: 10 %
Lymphs Abs: 0.7 10*3/uL (ref 0.7–4.0)
MCH: 30.5 pg (ref 26.0–34.0)
MCHC: 32.2 g/dL (ref 30.0–36.0)
MCV: 94.4 fL (ref 80.0–100.0)
Monocytes Absolute: 0.5 10*3/uL (ref 0.1–1.0)
Monocytes Relative: 7 %
Neutro Abs: 5.5 10*3/uL (ref 1.7–7.7)
Neutrophils Relative %: 83 %
Platelets: 164 10*3/uL (ref 150–400)
RBC: 4.86 MIL/uL (ref 3.87–5.11)
RDW: 12.8 % (ref 11.5–15.5)
WBC: 6.8 10*3/uL (ref 4.0–10.5)
nRBC: 0 % (ref 0.0–0.2)

## 2023-09-15 LAB — TYPE AND SCREEN
ABO/RH(D): A POS
Antibody Screen: NEGATIVE

## 2023-09-15 LAB — RESP PANEL BY RT-PCR (RSV, FLU A&B, COVID)  RVPGX2
Influenza A by PCR: POSITIVE — AB
Influenza B by PCR: NEGATIVE
Resp Syncytial Virus by PCR: NEGATIVE
SARS Coronavirus 2 by RT PCR: NEGATIVE

## 2023-09-15 MED ORDER — ACETAMINOPHEN 325 MG PO TABS
650.0000 mg | ORAL_TABLET | Freq: Once | ORAL | Status: AC
  Administered 2023-09-15: 650 mg via ORAL
  Filled 2023-09-15: qty 2

## 2023-09-15 MED ORDER — OSELTAMIVIR PHOSPHATE 30 MG PO CAPS: 30.0000 mg | ORAL_CAPSULE | Freq: Two times a day (BID) | ORAL | Status: DC

## 2023-09-15 MED ORDER — ENOXAPARIN SODIUM 40 MG/0.4ML IJ SOSY
40.0000 mg | PREFILLED_SYRINGE | INTRAMUSCULAR | Status: DC
  Filled 2023-09-15: qty 0.4

## 2023-09-15 MED ORDER — GUAIFENESIN-DM 100-10 MG/5ML PO SYRP: 5.0000 mL | ORAL_SOLUTION | ORAL | Status: DC | PRN

## 2023-09-15 MED ORDER — ONDANSETRON HCL 4 MG PO TABS
4.0000 mg | ORAL_TABLET | Freq: Four times a day (QID) | ORAL | Status: DC | PRN
  Administered 2023-09-18: 4 mg via ORAL
  Filled 2023-09-15: qty 1

## 2023-09-15 MED ORDER — OSELTAMIVIR PHOSPHATE 75 MG PO CAPS
75.0000 mg | ORAL_CAPSULE | Freq: Once | ORAL | Status: DC
  Filled 2023-09-15: qty 1

## 2023-09-15 MED ORDER — ACETAMINOPHEN 650 MG RE SUPP: 650.0000 mg | Freq: Four times a day (QID) | RECTAL | Status: DC | PRN

## 2023-09-15 MED ORDER — ACETAMINOPHEN 325 MG PO TABS
650.0000 mg | ORAL_TABLET | Freq: Four times a day (QID) | ORAL | Status: DC | PRN
  Administered 2023-09-16 – 2023-09-20 (×3): 650 mg via ORAL
  Filled 2023-09-15 (×3): qty 2

## 2023-09-15 MED ORDER — ONDANSETRON HCL 4 MG/2ML IJ SOLN
4.0000 mg | Freq: Once | INTRAMUSCULAR | Status: AC
  Administered 2023-09-15: 4 mg via INTRAVENOUS
  Filled 2023-09-15: qty 2

## 2023-09-15 MED ORDER — SODIUM CHLORIDE 0.9 % IV BOLUS
500.0000 mL | Freq: Once | INTRAVENOUS | Status: AC
  Administered 2023-09-15: 500 mL via INTRAVENOUS

## 2023-09-15 MED ORDER — PANTOPRAZOLE SODIUM 40 MG PO TBEC
40.0000 mg | DELAYED_RELEASE_TABLET | Freq: Every day | ORAL | Status: DC
  Administered 2023-09-15 – 2023-09-20 (×6): 40 mg via ORAL
  Filled 2023-09-15 (×6): qty 1

## 2023-09-15 MED ORDER — DM-GUAIFENESIN ER 30-600 MG PO TB12
1.0000 | ORAL_TABLET | Freq: Two times a day (BID) | ORAL | Status: DC
  Administered 2023-09-15 – 2023-09-22 (×13): 1 via ORAL
  Filled 2023-09-15 (×14): qty 1

## 2023-09-15 MED ORDER — ONDANSETRON HCL 4 MG/2ML IJ SOLN
4.0000 mg | Freq: Four times a day (QID) | INTRAMUSCULAR | Status: DC | PRN
  Administered 2023-09-17 – 2023-09-20 (×3): 4 mg via INTRAVENOUS
  Filled 2023-09-15 (×4): qty 2

## 2023-09-15 MED ORDER — LEVALBUTEROL HCL 0.63 MG/3ML IN NEBU
0.6300 mg | INHALATION_SOLUTION | Freq: Four times a day (QID) | RESPIRATORY_TRACT | Status: DC
  Administered 2023-09-15 – 2023-09-16 (×5): 0.63 mg via RESPIRATORY_TRACT
  Filled 2023-09-15 (×5): qty 3

## 2023-09-15 MED ORDER — LISINOPRIL 10 MG PO TABS
10.0000 mg | ORAL_TABLET | Freq: Every day | ORAL | Status: DC
Start: 2023-09-15 — End: 2023-09-16
  Administered 2023-09-15: 10 mg via ORAL
  Filled 2023-09-15: qty 1

## 2023-09-15 NOTE — ED Provider Triage Note (Signed)
Emergency Medicine Provider Triage Evaluation Note  Mary Moss , a 88 y.o. female  was evaluated in triage.  Pt complains of 4-day history of increasing shortness of breath, cough and chills, no documented fever.  Cough has been nonproductive.  This shortness has breath has worsened over the past 24 hours.  Reached out to her endocrinologist but was recommended she seek treatment in the emergency department.  She has had no known exposures to influenza or COVID.  She does not use oxygen at baseline, was 85% on room air upon arrival, placed on 4 L nasal cannula and is currently 91% on oxygen.  Denies chest pain, denies peripheral edema  Positive: Cough, shortness of breath, fever Negative: Chest pain, peripheral edema  Physical Exam  BP (!) 151/79 (BP Location: Left Arm)   Pulse (!) 104   Temp (!) 100.7 F (38.2 C) (Temporal)   Resp 18   Ht 5\' 8"  (1.727 m)   Wt 93.7 kg   SpO2 91%   BMI 31.41 kg/m  Gen:   Awake, no distress   Resp:  Mild respiratory distress without accessory muscle use, she does have prolonged expirations, all lung fields are rhonchorous. MSK:   Moves extremities without difficulty  Other:    Medical Decision Making  Medically screening exam initiated at 5:27 PM.  Appropriate orders placed.  Dennison Mascot was informed that the remainder of the evaluation will be completed by another provider, this initial triage assessment does not replace that evaluation, and the importance of remaining in the ED until their evaluation is complete.     Burgess Amor, PA-C 09/15/23 1732

## 2023-09-15 NOTE — ED Notes (Signed)
PT SIPPING ON WATER-TOLERATING WELL.

## 2023-09-15 NOTE — ED Notes (Signed)
RT called and made aware of need for xopenex tx

## 2023-09-15 NOTE — ED Provider Notes (Signed)
 [place] [facility] AT [facility] Provider Note   CSN: [id number] Arrival date & time: 09/15/23  1637     History  Chief Complaint  Patient presents with   Shortness of Breath    Mary Moss is a 88 y.o. female with a history including hypertension, GERD, history of pneumonia and rheumatoid arthritis presenting with a 4-day history of cough, subjective fever with complaints of feeling cold, shortness of breath and nonproductive cough.  She also endorses just generalized weakness and bodyaches.  She also endorses difficulty with ambulation secondary to worsening shortness of breath.  She has had no exposures to others with similar symptoms to her knowledge.  Upon presentation her oxygen saturation is 85% on room air.  She was placed on 4 L nasal cannula at which time her oxygen level has been in the 91-92 range.  She denies chest pain, abdominal pain but does endorse nausea and has had almost no p.o. intake since her symptoms began.  Denies vomiting, diarrhea, abdominal pain.  The history is provided by the patient and a relative.       Home Medications Prior to Admission medications   Medication Sig Start Date End Date Taking? Authorizing Provider  leflunomide (ARAVA) 20 MG tablet Take 20 mg by mouth daily. 07/15/23  Yes [provider]  acetaminophen (TYLENOL) 325 MG tablet Take 2 tablets (650 mg total) by mouth every 6 (six) hours as needed for mild pain (or Fever >/= 101). 01/22/22   Emokpae, Courage, MD  Albuterol Sulfate (PROAIR RESPICLICK) 108 (90 Base) MCG/ACT AEPB Inhale 1-2 puffs into the lungs every 6 (six) hours as needed (shortness of breath).    [provider]  allopurinol (ZYLOPRIM) 100 MG tablet Take 100 mg by mouth daily.    [provider]  clobetasol ointment (TEMOVATE) 0.05 % 1 Application 2 (two) times daily. 09/06/23   [provider]  D3-50 1.25 MG (50000 UT) capsule Take 50,000 Units by mouth every  Sunday. 10/23/19   [provider]  diphenhydramine-acetaminophen (TYLENOL PM EXTRA STRENGTH) 25-500 MG TABS tablet Take 1 tablet by mouth at bedtime.    [provider]  hydrochlorothiazide (HYDRODIURIL) 25 MG tablet Take 0.5 tablets (12.5 mg total) by mouth daily. Patient taking differently: Take 25 mg by mouth daily. 01/22/22   Shon Hale, MD  hydroxychloroquine (PLAQUENIL) 200 MG tablet Take 400 mg by mouth 2 (two) times daily. 11/25/21   [provider]  lisinopril (ZESTRIL) 10 MG tablet Take 1 tablet (10 mg total) by mouth daily. (NEEDS TO BE SEEN BEFORE NEXT REFILL) 09/04/22   Junie Spencer, FNP  Multiple Vitamins-Minerals (ALIVE HAIR, SKIN & NAILS) CHEW Chew 2 tablets by mouth daily.    [provider]  pantoprazole (PROTONIX) 40 MG tablet Take 1 tablet (40 mg total) by mouth daily. 04/07/22   Dolores Frame, MD  triamcinolone cream (KENALOG) 0.1 % Apply 1 Application topically 2 (two) times daily. 09/06/23   [provider]      Allergies    Statins, Bacitracin-polymyxin b, and Neosporin [neomycin-bacitracin zn-polymyx]    Review of Systems   Review of Systems  Constitutional:  Positive for chills.  HENT:  Negative for congestion and sore throat.   Eyes: Negative.   Respiratory:  Positive for cough and shortness of breath. Negative for chest tightness.   Cardiovascular:  Negative for chest pain.  Gastrointestinal:  Positive for nausea. Negative for abdominal pain, diarrhea and vomiting.  Genitourinary:  Positive for decreased urine volume.  Musculoskeletal:  Positive for myalgias. Negative for arthralgias, joint swelling and neck pain.  Skin: Negative.  Negative for rash and wound.  Neurological:  Negative for dizziness, weakness, light-headedness, numbness and headaches.  Psychiatric/Behavioral: Negative.    All other systems reviewed and are negative.   Physical Exam Updated Vital Signs BP (!) 151/79 (BP Location:  Left Arm)   Pulse (!) 104   Temp (!) 100.7 F (38.2 C) (Temporal)   Resp 18   Ht 5\' 8"  (1.727 m)   Wt 93.7 kg   SpO2 91%   BMI 31.41 kg/m  Physical Exam Constitutional:      Appearance: She is well-developed.  HENT:     Head: Normocephalic and atraumatic.     Right Ear: Tympanic membrane and ear canal normal.     Left Ear: Tympanic membrane and ear canal normal.     Nose: Mucosal edema and rhinorrhea present.     Mouth/Throat:     Pharynx: Oropharynx is clear. Uvula midline. No oropharyngeal exudate or posterior oropharyngeal erythema.     Tonsils: No tonsillar abscesses.     Comments: Buccal mucosa dry. Eyes:     Conjunctiva/sclera: Conjunctivae normal.  Cardiovascular:     Rate and Rhythm: Normal rate.     Heart sounds: Normal heart sounds.  Pulmonary:     Effort: Pulmonary effort is normal. No respiratory distress.     Breath sounds: Rhonchi present. No wheezing or rales.     Comments: Rhonchi bilateral lung fields, no wheezing present. Abdominal:     Palpations: Abdomen is soft.     Tenderness: There is no abdominal tenderness.  Musculoskeletal:        General: Normal range of motion.  Skin:    General: Skin is warm and dry.     Findings: No rash.  Neurological:     General: No focal deficit present.     Mental Status: She is alert and oriented to person, place, and time.     ED Results / Procedures / Treatments   Labs (all labs ordered are listed, but only abnormal results are displayed) Labs Reviewed  RESP PANEL BY RT-PCR (RSV, FLU A&B, COVID)  RVPGX2 - Abnormal; Notable for the following components:      Result Value   Influenza A by PCR POSITIVE (*)    All other components within normal limits  COMPREHENSIVE METABOLIC PANEL - Abnormal; Notable for the following components:   Chloride 97 (*)    Glucose, Bld 108 (*)    Calcium 8.8 (*)    GFR, Estimated 59 (*)    All other components within normal limits  CBC WITH DIFFERENTIAL/PLATELET  TYPE AND SCREEN     EKG None ED ECG REPORT   Date: 09/15/2023  Rate: 98  Rhythm: normal sinus rhythm  QRS Axis: normal  Intervals: normal  ST/T Wave abnormalities: normal  Conduction Disutrbances:none  Narrative Interpretation:   Old EKG Reviewed: unchanged  I have personally reviewed the EKG tracing and agree with the computerized printout as noted.  Radiology DG Chest 2 View Result Date: 09/15/2023 CLINICAL DATA:  Shortness of breath patient reports flu like symptoms. Hypoxia. EXAM: CHEST - 2 VIEW COMPARISON:  10/08/2021 FINDINGS: Chronic hyperinflation. Mild bronchial thickening. Ill-defined opacity in the lung bases suggestive of atelectasis and possible trace effusions. Stable heart size and mediastinal contours. Aortic atherosclerosis. No pulmonary edema. No pneumothorax. Reverse right shoulder arthroplasty. IMPRESSION: 1. Chronic hyperinflation with mild bronchial thickening.  2. Ill-defined opacity in the lung bases suggestive of atelectasis and possible trace effusions. Electronically Signed   By: Narda Rutherford M.D.   On: 09/15/2023 17:25    Procedures Procedures    Medications Ordered in ED Medications  acetaminophen (TYLENOL) tablet 650 mg (has no administration in time range)  sodium chloride 0.9 % bolus 500 mL (has no administration in time range)  ondansetron (ZOFRAN) injection 4 mg (has no administration in time range)  oseltamivir (TAMIFLU) capsule 75 mg (has no administration in time range)    ED Course/ Medical Decision Making/ A&P                                 Medical Decision Making Patient presenting with a 4-day history of flulike symptoms, upon presentation is significantly hypoxic here at 85% on room air.  Her chest x-ray does not suggest pneumonia although her respiratory exam is revealing for significant rhonchorous breath sounds, she also has dry buccal mucosa, her exam is otherwise unremarkable although she does appear to be quite fatigued.  Labs and imaging as  outlined below, given her new oxygen requirement, currently on 4 L with a O2 sat of 92%, she will require admission.  Tamiflu has been ordered, IV fluids, Tylenol also ordered.  Amount and/or Complexity of Data Reviewed Labs: ordered.    Details: Respiratory panel is positive for influenza A her c-Met is relatively stable she does have a slight reduction in her chloride at 97 her glucose is 108, her creatinine is 0.94.  CBC is normal with a WBC count of 6.8, her hemoglobin is 14.8.  Respiratory panel positive for influenza A Radiology: ordered.    Details: Imaging reviewed, ill-defined opacity lung bases suggesting atelectasis, possible trace effusions. Discussion of management or test interpretation with external provider(s): Discussed with Dr. Thomes Dinning.   Risk Decision regarding hospitalization. Risk Details: Call placed to the hospitalist for admission   CRITICAL CARE Performed by: Burgess Amor Total critical care time: 40  minutes Critical care time was exclusive of separately billable procedures and treating other patients. Critical care was necessary to treat or prevent imminent or life-threatening deterioration. Critical care was time spent personally by me on the following activities: development of treatment plan with patient and/or surrogate as well as nursing, discussions with consultants, evaluation of patient's response to treatment, examination of patient, obtaining history from patient or surrogate, ordering and performing treatments and interventions, ordering and review of laboratory studies, ordering and review of radiographic studies, pulse oximetry and re-evaluation of patient's condition.          Final Clinical Impression(s) / ED Diagnoses Final diagnoses:  Hypoxia  Influenza A    Rx / DC Orders ED Discharge Orders     None         Victoriano Lain 09/15/23 2012    Lonell Grandchild, MD 09/26/23 1145

## 2023-09-15 NOTE — ED Triage Notes (Signed)
Pt arrived via POV c/o SOB and flu-like symptoms since Sunday. Pt placed on 4L Nasal Cannula in Triage. Pts O2 Sats 85% on room air.

## 2023-09-15 NOTE — ED Notes (Signed)
PT AND FAMILY STATE, THEY DO NOT WANT TAMIFLU- PT FAMILY SAYS QUESTIONS WHY IT WAS ORDERED AS SHE THOUGHT IT WASN'T EVEN SUPPOSED TO BE USED UNLESS IT WAS WITHIN FIRST 2 DAYS OF HAVING FLU SX.  DR ADEFESO MADE AWARE OF THIS- REQUEST TO DC THIS MEDICATION SENT TO ADEFESO VIA SECURE CHAT.

## 2023-09-15 NOTE — H&P (Signed)
History and Physical    Patient: Mary Moss ZOX:[id number] DOB: 12/04/1935 DOA: 09/15/2023 DOS: the patient was seen and examined on 09/15/2023 PCP: Adrian Prince, MD  Patient coming from: Home  Chief Complaint:  Chief Complaint  Patient presents with   Shortness of Breath   HPI: Mary Moss is an 88 y.o. female with medical history significant of hypertension, GERD who presents to the emergency department due to 4-day onset of cold symptoms, nonproductive cough, subjective fever, generalized weakness, body aches, shortness of breath which worsens on ambulation she denies any sick contact.  ED Course:  In the emergency department, she was hypoxic with O2 sat of 85% on room air on arrival to the ED, supplemental oxygen via [place] at 4 LPM was provided with improvement in O2 sat to 91-96%.  Temperature 100.7, pulse 104 bpm, respiratory rate 18/min, BP 151/79.  Workup in the ED showed normal CBC and BMP except for chloride of 97, blood glucose 108 and calcium 8.8.  Influenza A was positive.  Influenza B, RSV, SARS coronavirus was negative. Chest x-ray showed chronic hyperinflation with mild bronchial thickening.  Ill-defined opacity in the lung bases suggestive of atelectasis and possible trace effusions. She was treated with Tylenol, Zofran was given, Tamiflu was provided and IV hydration with 500 mL of NS was given. Hospitalist was asked to admit patient for further evaluation and management.  Review of Systems: Review of systems as noted in the HPI. All other systems reviewed and are negative.   Past Medical History:  Diagnosis Date   Ankle fracture fx 13 yrs ago, anklw swells off and on, has cramps in ankle also   left, to seee dr hewitt about 02-16-14   Arthritis    osteo and RA   Basal cell carcinoma 2015   generalized   Difficult intubation    GERD (gastroesophageal reflux disease)    OTC med   pt. denies   Hypertension    Pneumonia    Pre-diabetes    Past  Surgical History:  Procedure Laterality Date   ABDOMINAL HYSTERECTOMY     BIOPSY  01/21/2022   Procedure: BIOPSY;  Surgeon: Marguerita Merles, Reuel Boom, MD;  Location: [facility];  Service: Gastroenterology;;  gastric ulcer biopsies, gastric biopsies;   COLONOSCOPY WITH PROPOFOL N/A 01/21/2022   Procedure: COLONOSCOPY WITH PROPOFOL;  Surgeon: Dolores Frame, MD;  Location: [facility];  Service: Gastroenterology;  Laterality: N/A;   ESOPHAGOGASTRODUODENOSCOPY (EGD) WITH PROPOFOL N/A 01/21/2022   Procedure: ESOPHAGOGASTRODUODENOSCOPY (EGD) WITH PROPOFOL;  Surgeon: Dolores Frame, MD;  Location: [facility];  Service: Gastroenterology;  Laterality: N/A;   ESOPHAGOGASTRODUODENOSCOPY (EGD) WITH PROPOFOL N/A 04/07/2022   Procedure: ESOPHAGOGASTRODUODENOSCOPY (EGD) WITH PROPOFOL;  Surgeon: Dolores Frame, MD;  Location: [facility];  Service: Gastroenterology;  Laterality: N/A;  1100 ASA 3   EYE SURGERY Bilateral    bilaterla cataract extraction with IOL   FRACTURE SURGERY Left 2001   JOINT REPLACEMENT     KNEE ARTHROSCOPY Right 02/07/2014   Procedure: RIGHT ARTHROSCOPY KNEE, WITH MEDIAL MENISCAL DEBRIDEMENT AND CHONDRAPLASTY;  Surgeon: Loanne Drilling, MD;  Location: [facility];  Service: Orthopedics;  Laterality: Right;   ROTATOR CUFF REPAIR Right 2009   TOTAL ANKLE ARTHROPLASTY Left 12/20/2014   Procedure: LEFT TOTAL ANKLE ARTHOPLASTY WITH PERCUTANEOUS ACHILLES TENDON LENGTHENING;  Surgeon: Toni Arthurs, MD;  Location: [facility];  Service: Orthopedics;  Laterality: Left;   TOTAL HIP ARTHROPLASTY Left 12/31/2021   Procedure: TOTAL HIP ARTHROPLASTY ANTERIOR  APPROACH;  Surgeon: Ollen Gross, MD;  Location: [facility];  Service: Orthopedics;  Laterality: Left;   TOTAL KNEE ARTHROPLASTY Right 06/08/2016   Procedure: RIGHT TOTAL KNEE ARTHROPLASTY;  Surgeon: Ollen Gross, MD;  Location: [facility];  Service: Orthopedics;  Laterality: Right;   TOTAL SHOULDER ARTHROPLASTY Right  11/23/2019   Procedure: REVERSE TOTAL SHOULDER ARTHROPLASTY;  Surgeon: Jones Broom, MD;  Location: [facility];  Service: Orthopedics;  Laterality: Right;    Social History:  reports that she quit smoking about 15 years ago. Her smoking use included cigarettes. She started smoking about 66 years ago. She has a 25.2 pack-year smoking history. She has never used smokeless tobacco. She reports that she does not currently use alcohol. She reports that she does not use drugs.   Allergies  Allergen Reactions   Statins     Simvastatin, crestor, livalo Not able to walk because so much muscle/joint pain   Bacitracin-Polymyxin B Rash and Other (See Comments)   Neosporin [Neomycin-Bacitracin Zn-Polymyx] Rash    Family History  Problem Relation Age of Onset   Cancer Mother        colon   Cancer Father        lung   Cancer Sister        lung cancer-nonsmoker   Alzheimer's disease Brother 59     Prior to Admission medications   Medication Sig Start Date End Date Taking? Authorizing Provider  leflunomide (ARAVA) 20 MG tablet Take 20 mg by mouth daily. 07/15/23  Yes [provider]  acetaminophen (TYLENOL) 325 MG tablet Take 2 tablets (650 mg total) by mouth every 6 (six) hours as needed for mild pain (or Fever >/= 101). 01/22/22   Emokpae, Courage, MD  Albuterol Sulfate (PROAIR RESPICLICK) 108 (90 Base) MCG/ACT AEPB Inhale 1-2 puffs into the lungs every 6 (six) hours as needed (shortness of breath).    [provider]  allopurinol (ZYLOPRIM) 100 MG tablet Take 100 mg by mouth daily.    [provider]  clobetasol ointment (TEMOVATE) 0.05 % 1 Application 2 (two) times daily. 09/06/23   [provider]  D3-50 1.25 MG (50000 UT) capsule Take 50,000 Units by mouth every Sunday. 10/23/19   [provider]  diphenhydramine-acetaminophen (TYLENOL PM EXTRA STRENGTH) 25-500 MG TABS tablet Take 1 tablet by mouth at bedtime.    [provider]   hydrochlorothiazide (HYDRODIURIL) 25 MG tablet Take 0.5 tablets (12.5 mg total) by mouth daily. Patient taking differently: Take 25 mg by mouth daily. 01/22/22   Shon Hale, MD  hydroxychloroquine (PLAQUENIL) 200 MG tablet Take 400 mg by mouth 2 (two) times daily. 11/25/21   [provider]  lisinopril (ZESTRIL) 10 MG tablet Take 1 tablet (10 mg total) by mouth daily. (NEEDS TO BE SEEN BEFORE NEXT REFILL) 09/04/22   Junie Spencer, FNP  Multiple Vitamins-Minerals (ALIVE HAIR, SKIN & NAILS) CHEW Chew 2 tablets by mouth daily.    [provider]  pantoprazole (PROTONIX) 40 MG tablet Take 1 tablet (40 mg total) by mouth daily. 04/07/22   Dolores Frame, MD  triamcinolone cream (KENALOG) 0.1 % Apply 1 Application topically 2 (two) times daily. 09/06/23   [provider]    Physical Exam: BP (!) 113/55   Pulse 91   Temp 99.1 F (37.3 C)   Resp 20   Ht 5\' 8"  (1.727 m)   Wt 93.7 kg   SpO2 92%   BMI 31.41 kg/m   General: 88 y.o. year-old  female well developed well nourished in no acute distress.  Alert and oriented x3. HEENT: NCAT, EOMI Neck: Supple, trachea medial Cardiovascular: Regular rate and rhythm with no rubs or gallops.  No thyromegaly or JVD noted.  No lower extremity edema. 2/4 pulses in all 4 extremities. Respiratory: Clear to auscultation with no wheezes or rales. Good inspiratory effort. Abdomen: Soft, nontender nondistended with normal bowel sounds x4 quadrants. Muskuloskeletal: No cyanosis, clubbing or edema noted bilaterally Neuro: CN II-XII intact, strength 5/5 x 4, sensation, reflexes intact Skin: No ulcerative lesions noted or rashes Psychiatry: Judgement and insight appear normal. Mood is appropriate for condition and setting          Labs on Admission:  Basic Metabolic Panel: Recent Labs  Lab 09/15/23 1740  NA 135  K 3.8  CL 97*  CO2 24  GLUCOSE 108*  BUN 19  CREATININE 0.94  CALCIUM 8.8*   Liver Function  Tests: Recent Labs  Lab 09/15/23 1740  AST 34  ALT 24  ALKPHOS 76  BILITOT 1.1  PROT 7.1  ALBUMIN 3.5   No results for input(s): "LIPASE", "AMYLASE" in the last 168 hours. No results for input(s): "AMMONIA" in the last 168 hours. CBC: Recent Labs  Lab 09/15/23 1740  WBC 6.8  NEUTROABS 5.5  HGB 14.8  HCT 45.9  MCV 94.4  PLT 164   Cardiac Enzymes: No results for input(s): "CKTOTAL", "CKMB", "CKMBINDEX", "TROPONINI" in the last 168 hours.  BNP (last 3 results) No results for input(s): "BNP" in the last 8760 hours.  ProBNP (last 3 results) No results for input(s): "PROBNP" in the last 8760 hours.  CBG: No results for input(s): "GLUCAP" in the last 168 hours.  Radiological Exams on Admission: DG Chest 2 View Result Date: 09/15/2023 CLINICAL DATA:  Shortness of breath patient reports flu like symptoms. Hypoxia. EXAM: CHEST - 2 VIEW COMPARISON:  10/08/2021 FINDINGS: Chronic hyperinflation. Mild bronchial thickening. Ill-defined opacity in the lung bases suggestive of atelectasis and possible trace effusions. Stable heart size and mediastinal contours. Aortic atherosclerosis. No pulmonary edema. No pneumothorax. Reverse right shoulder arthroplasty. IMPRESSION: 1. Chronic hyperinflation with mild bronchial thickening. 2. Ill-defined opacity in the lung bases suggestive of atelectasis and possible trace effusions. Electronically Signed   By: Narda Rutherford M.D.   On: 09/15/2023 17:25    EKG: I independently viewed the EKG done and my findings are as followed: Normal sinus rhythm at 98 bpm with APCs  Assessment/Plan Present on Admission:  Influenza A  Essential hypertension  Principal Problem:   Influenza A Active Problems:   Essential hypertension   SIRS (systemic inflammatory response syndrome) (HCC)   Acute respiratory failure with hypoxia (HCC)   Atelectasis   GERD (gastroesophageal reflux disease)  Influenza A SIRS in the setting of above Patient was febrile and  tachycardic thereby meeting SIRS criteria Patient refused Tamiflu per RN Continue Xopenex, Mucinex, Robitussin Continue incentive spirometry and flutter valve Continue supplemental oxygen to maintain O2 sat > 92% with plan to wean patient off oxygen as tolerated  Acute respiratory failure with hypoxia Continue supplemental oxygen to maintain O2 sat > 92% with plan to wean patient off oxygen as tolerated.  Patient does not use supplemental oxygen at baseline  Atelectasis Continue incentive spirometry and flutter valve     Essential hypertension Continue lisinopril  GERD Continue Protonix  Obesity (BMI 31.1) Diet and lifestyle modification  DVT prophylaxis: Lovenox  Code Status: Full code  Family Communication: None at bedside  Consults: None  Severity of Illness: The appropriate patient status for this patient is INPATIENT. Inpatient status is judged to be reasonable and necessary in order to provide the required intensity of service to ensure the patient's safety. The patient's presenting symptoms, physical exam findings, and initial radiographic and laboratory data in the context of their chronic comorbidities is felt to place them at high risk for further clinical deterioration. Furthermore, it is not anticipated that the patient will be medically stable for discharge from the hospital within 2 midnights of admission.   * I certify that at the point of admission it is my clinical judgment that the patient will require inpatient hospital care spanning beyond 2 midnights from the point of admission due to high intensity of service, high risk for further deterioration and high frequency of surveillance required.*  Author: Frankey Shown, DO 09/15/2023 8:49 PM  For on call review [email or web address].

## 2023-09-15 NOTE — ED Notes (Signed)
Pt reports dark black stool yesterday. Pt has Hx of GI Bleeding.

## 2023-09-16 ENCOUNTER — Inpatient Hospital Stay (HOSPITAL_COMMUNITY): Payer: HMO

## 2023-09-16 ENCOUNTER — Telehealth (HOSPITAL_COMMUNITY): Payer: Self-pay | Admitting: Pharmacy Technician

## 2023-09-16 ENCOUNTER — Other Ambulatory Visit (HOSPITAL_COMMUNITY): Payer: Self-pay

## 2023-09-16 DIAGNOSIS — I480 Paroxysmal atrial fibrillation: Secondary | ICD-10-CM | POA: Diagnosis not present

## 2023-09-16 DIAGNOSIS — J101 Influenza due to other identified influenza virus with other respiratory manifestations: Secondary | ICD-10-CM | POA: Diagnosis not present

## 2023-09-16 DIAGNOSIS — I10 Essential (primary) hypertension: Secondary | ICD-10-CM | POA: Diagnosis not present

## 2023-09-16 DIAGNOSIS — I4892 Unspecified atrial flutter: Secondary | ICD-10-CM

## 2023-09-16 DIAGNOSIS — I4891 Unspecified atrial fibrillation: Secondary | ICD-10-CM | POA: Insufficient documentation

## 2023-09-16 DIAGNOSIS — J9601 Acute respiratory failure with hypoxia: Secondary | ICD-10-CM | POA: Diagnosis not present

## 2023-09-16 LAB — CBC
HCT: 45.4 % (ref 36.0–46.0)
Hemoglobin: 14.2 g/dL (ref 12.0–15.0)
MCH: 30.2 pg (ref 26.0–34.0)
MCHC: 31.3 g/dL (ref 30.0–36.0)
MCV: 96.6 fL (ref 80.0–100.0)
Platelets: 143 10*3/uL — ABNORMAL LOW (ref 150–400)
RBC: 4.7 MIL/uL (ref 3.87–5.11)
RDW: 12.6 % (ref 11.5–15.5)
WBC: 6.7 10*3/uL (ref 4.0–10.5)
nRBC: 0 % (ref 0.0–0.2)

## 2023-09-16 LAB — COMPREHENSIVE METABOLIC PANEL
ALT: 20 U/L (ref 0–44)
AST: 30 U/L (ref 15–41)
Albumin: 3 g/dL — ABNORMAL LOW (ref 3.5–5.0)
Alkaline Phosphatase: 63 U/L (ref 38–126)
Anion gap: 11 (ref 5–15)
BUN: 21 mg/dL (ref 8–23)
CO2: 26 mmol/L (ref 22–32)
Calcium: 8.2 mg/dL — ABNORMAL LOW (ref 8.9–10.3)
Chloride: 99 mmol/L (ref 98–111)
Creatinine, Ser: 0.97 mg/dL (ref 0.44–1.00)
GFR, Estimated: 57 mL/min — ABNORMAL LOW (ref >60)
Glucose, Bld: 93 mg/dL (ref 70–99)
Potassium: 3.5 mmol/L (ref 3.5–5.1)
Sodium: 136 mmol/L (ref 135–145)
Total Bilirubin: 1.1 mg/dL (ref 0.0–1.2)
Total Protein: 6.2 g/dL — ABNORMAL LOW (ref 6.5–8.1)

## 2023-09-16 LAB — MAGNESIUM: Magnesium: 1.9 mg/dL (ref 1.7–2.4)

## 2023-09-16 LAB — PHOSPHORUS: Phosphorus: 3.4 mg/dL (ref 2.5–4.6)

## 2023-09-16 LAB — T4, FREE: Free T4: 1.05 ng/dL (ref 0.61–1.12)

## 2023-09-16 LAB — BRAIN NATRIURETIC PEPTIDE: B Natriuretic Peptide: 57 pg/mL (ref 0.0–100.0)

## 2023-09-16 LAB — HEPARIN LEVEL (UNFRACTIONATED): Heparin Unfractionated: 0.95 [IU]/mL — ABNORMAL HIGH (ref 0.30–0.70)

## 2023-09-16 LAB — MRSA NEXT GEN BY PCR, NASAL: MRSA by PCR Next Gen: NOT DETECTED

## 2023-09-16 LAB — PROCALCITONIN: Procalcitonin: 0.24 ng/mL

## 2023-09-16 LAB — TSH: TSH: 1.064 u[IU]/mL (ref 0.350–4.500)

## 2023-09-16 MED ORDER — POTASSIUM CHLORIDE CRYS ER 20 MEQ PO TBCR
20.0000 meq | EXTENDED_RELEASE_TABLET | Freq: Once | ORAL | Status: AC
  Administered 2023-09-16: 20 meq via ORAL
  Filled 2023-09-16: qty 1

## 2023-09-16 MED ORDER — BUDESONIDE 0.5 MG/2ML IN SUSP
0.5000 mg | Freq: Two times a day (BID) | RESPIRATORY_TRACT | Status: DC
  Administered 2023-09-16 – 2023-09-22 (×13): 0.5 mg via RESPIRATORY_TRACT
  Filled 2023-09-16 (×13): qty 2

## 2023-09-16 MED ORDER — METOPROLOL TARTRATE 25 MG PO TABS
25.0000 mg | ORAL_TABLET | Freq: Four times a day (QID) | ORAL | Status: DC
  Administered 2023-09-16 (×3): 25 mg via ORAL
  Filled 2023-09-16 (×3): qty 1

## 2023-09-16 MED ORDER — MAGNESIUM SULFATE 2 GM/50ML IV SOLN
2.0000 g | Freq: Once | INTRAVENOUS | Status: AC
  Administered 2023-09-16: 2 g via INTRAVENOUS
  Filled 2023-09-16: qty 50

## 2023-09-16 MED ORDER — HEPARIN BOLUS VIA INFUSION
4500.0000 [IU] | Freq: Once | INTRAVENOUS | Status: AC
  Administered 2023-09-16: 4500 [IU] via INTRAVENOUS
  Filled 2023-09-16: qty 4500

## 2023-09-16 MED ORDER — POTASSIUM CHLORIDE CRYS ER 20 MEQ PO TBCR
40.0000 meq | EXTENDED_RELEASE_TABLET | Freq: Once | ORAL | Status: AC
  Administered 2023-09-16: 40 meq via ORAL
  Filled 2023-09-16: qty 2

## 2023-09-16 MED ORDER — LEVALBUTEROL HCL 0.63 MG/3ML IN NEBU
0.6300 mg | INHALATION_SOLUTION | Freq: Three times a day (TID) | RESPIRATORY_TRACT | Status: DC
  Administered 2023-09-17 – 2023-09-22 (×16): 0.63 mg via RESPIRATORY_TRACT
  Filled 2023-09-16 (×15): qty 3

## 2023-09-16 MED ORDER — LEVALBUTEROL HCL 0.63 MG/3ML IN NEBU
0.6300 mg | INHALATION_SOLUTION | Freq: Four times a day (QID) | RESPIRATORY_TRACT | Status: DC | PRN
  Filled 2023-09-16: qty 3

## 2023-09-16 MED ORDER — DILTIAZEM LOAD VIA INFUSION
5.0000 mg | Freq: Once | INTRAVENOUS | Status: AC
  Administered 2023-09-16: 5 mg via INTRAVENOUS
  Filled 2023-09-16: qty 5

## 2023-09-16 MED ORDER — CHLORHEXIDINE GLUCONATE CLOTH 2 % EX PADS
6.0000 | MEDICATED_PAD | Freq: Every day | CUTANEOUS | Status: DC
  Administered 2023-09-16 – 2023-09-22 (×7): 6 via TOPICAL

## 2023-09-16 MED ORDER — HEPARIN (PORCINE) 25000 UT/250ML-% IV SOLN
950.0000 [IU]/h | INTRAVENOUS | Status: DC
  Administered 2023-09-16: 1250 [IU]/h via INTRAVENOUS
  Administered 2023-09-17: 1100 [IU]/h via INTRAVENOUS
  Filled 2023-09-16 (×2): qty 250

## 2023-09-16 MED ORDER — DILTIAZEM HCL-DEXTROSE 125-5 MG/125ML-% IV SOLN (PREMIX)
5.0000 mg/h | INTRAVENOUS | Status: DC
  Administered 2023-09-16: 5 mg/h via INTRAVENOUS
  Filled 2023-09-16: qty 125

## 2023-09-16 NOTE — Progress Notes (Signed)
Nurse just got patient up to bedside commode.    Celesta Gentile, RCS

## 2023-09-16 NOTE — Progress Notes (Signed)
   09/16/23 1207  TOC Brief Assessment  Insurance and Status Reviewed  Patient has primary care physician Yes  Home environment has been reviewed Home  Prior level of function: independent  Prior/Current Home Services No current home services  Social Drivers of Health Review SDOH reviewed no interventions necessary  Readmission risk has been reviewed Yes  Transition of care needs no transition of care needs at this time   Transition of Care Department [facility]) has reviewed patient and no TOC needs have been identified at this time. We will continue to monitor patient advancement through interdisciplinary progression rounds. If new patient transition needs arise, please place a TOC consult.

## 2023-09-16 NOTE — Consult Note (Addendum)
Cardiology Consultation   Patient ID: Mary Moss MRN: [id number]; DOB: 03/25/1936  Admit date: 09/15/2023 Date of Consult: 09/16/2023  PCP:  Adrian Prince, MD   [facility] Providers Cardiologist:  New [facility]  Patient Profile:   Mary Moss is a 88 y.o. female with a hx of HTN, rheumatoid arthritis and GERD who is being seen 09/16/2023 for the evaluation of atrial fibrillation with RVR at the request of Dr. Arbutus Leas.  History of Present Illness:   Mary Moss presented to [facility] ED on 09/15/2023 for evaluation of worsening dyspnea, cough and chills. She reports symptoms started on Saturday and have progressively worsened. She did take a home Flu/COVID test and this was negative.Denies any associated chest pain or palpitations.  Oxygen saturations were 85% on room air and did improve into the 90's with 4 L nasal cannula. Initial labs showed WBC 6.8, Hgb 14.8, platelets 164, Na+ 135, K+ 3.9 and creatinine 0.94.  AST 34 and ALT 24. BNP 57. TSH 1.064. Positive for Influenza A. CXR showed chronic hyperinflation with mild bronchial thickening and ill-defined opacities in the lung bases suggestive atelectasis and possible trace effusions. Initial EKG showed normal sinus rhythm, heart rate 98 with no acute ST changes.  She was admitted for further management of acute hypoxic respiratory failure in the setting of influenza A. This morning, she was noted to go into atrial fibrillation/flutter with RVR and was started on IV Cardizem. She is asymptomatic with her tachycardia at this time. Denies any chest pain, palpitations, dizziness or presyncope. Says she was initially hesitant to be on anticoagulation as she had been on Xarelto for DVT prophylaxis in 2023 following left hip arthroplasty and had an acute GI bleed at that time. By review of notes, she had gastric ulcers and she does report she had been on high-dose pain medication at that time as well. She is open to  retrying anticoagulation.   Past Medical History:  Diagnosis Date   Ankle fracture fx 13 yrs ago, anklw swells off and on, has cramps in ankle also   left, to seee dr hewitt about 02-16-14   Arthritis    osteo and RA   Basal cell carcinoma 2015   generalized   Difficult intubation    GERD (gastroesophageal reflux disease)    OTC med   pt. denies   Hypertension    Pneumonia    Pre-diabetes     Past Surgical History:  Procedure Laterality Date   ABDOMINAL HYSTERECTOMY     BIOPSY  01/21/2022   Procedure: BIOPSY;  Surgeon: Marguerita Merles, Reuel Boom, MD;  [facility]: [facility];  Service: Gastroenterology;;  gastric ulcer biopsies, gastric biopsies;   COLONOSCOPY WITH PROPOFOL N/A 01/21/2022   Procedure: COLONOSCOPY WITH PROPOFOL;  Surgeon: Dolores Frame, MD;  Location: [facility];  Service: Gastroenterology;  Laterality: N/A;   ESOPHAGOGASTRODUODENOSCOPY (EGD) WITH PROPOFOL N/A 01/21/2022   Procedure: ESOPHAGOGASTRODUODENOSCOPY (EGD) WITH PROPOFOL;  Surgeon: Dolores Frame, MD;  [facility]: [facility];  Service: Gastroenterology;  Laterality: N/A;   ESOPHAGOGASTRODUODENOSCOPY (EGD) WITH PROPOFOL N/A 04/07/2022   Procedure: ESOPHAGOGASTRODUODENOSCOPY (EGD) WITH PROPOFOL;  Surgeon: Dolores Frame, MD;  Location: [facility];  Service: Gastroenterology;  Laterality: N/A;  1100 ASA 3   EYE SURGERY Bilateral    bilaterla cataract extraction with IOL   FRACTURE SURGERY Left 2001   JOINT REPLACEMENT     KNEE ARTHROSCOPY Right 02/07/2014   Procedure: RIGHT ARTHROSCOPY KNEE, WITH MEDIAL MENISCAL  DEBRIDEMENT AND CHONDRAPLASTY;  Surgeon: Loanne Drilling, MD;  Location: [facility];  Service: Orthopedics;  Laterality: Right;   ROTATOR CUFF REPAIR Right 2009   TOTAL ANKLE ARTHROPLASTY Left 12/20/2014   Procedure: LEFT TOTAL ANKLE ARTHOPLASTY WITH PERCUTANEOUS ACHILLES TENDON LENGTHENING;  Surgeon: Toni Arthurs, MD;  Location: [facility];  Service: Orthopedics;  Laterality:  Left;   TOTAL HIP ARTHROPLASTY Left 12/31/2021   Procedure: TOTAL HIP ARTHROPLASTY ANTERIOR APPROACH;  Surgeon: Ollen Gross, MD;  Location: [facility];  Service: Orthopedics;  Laterality: Left;   TOTAL KNEE ARTHROPLASTY Right 06/08/2016   Procedure: RIGHT TOTAL KNEE ARTHROPLASTY;  Surgeon: Ollen Gross, MD;  Location: [facility];  Service: Orthopedics;  Laterality: Right;   TOTAL SHOULDER ARTHROPLASTY Right 11/23/2019   Procedure: REVERSE TOTAL SHOULDER ARTHROPLASTY;  Surgeon: Jones Broom, MD;  Location: [facility];  Service: Orthopedics;  Laterality: Right;     Home Medications:  Prior to Admission medications   Medication Sig Start Date End Date Taking? Authorizing Provider  acetaminophen (TYLENOL) 325 MG tablet Take 2 tablets (650 mg total) by mouth every 6 (six) hours as needed for mild pain (or Fever >/= 101). 01/22/22  Yes Emokpae, Courage, MD  Albuterol Sulfate (PROAIR RESPICLICK) 108 (90 Base) MCG/ACT AEPB Inhale 1-2 puffs into the lungs every 6 (six) hours as needed (shortness of breath).   Yes [provider]  allopurinol (ZYLOPRIM) 100 MG tablet Take 100 mg by mouth daily.   Yes [provider]  clobetasol ointment (TEMOVATE) 0.05 % Apply 1 Application topically 2 (two) times daily. 09/06/23  Yes [provider]  D3-50 1.25 MG (50000 UT) capsule Take 50,000 Units by mouth every Sunday. 10/23/19  Yes [provider]  diphenhydramine-acetaminophen (TYLENOL PM EXTRA STRENGTH) 25-500 MG TABS tablet Take 1 tablet by mouth at bedtime.   Yes [provider]  hydrochlorothiazide (HYDRODIURIL) 25 MG tablet Take 0.5 tablets (12.5 mg total) by mouth daily. Patient taking differently: Take 25 mg by mouth daily. 01/22/22  Yes Emokpae, Courage, MD  hydroxychloroquine (PLAQUENIL) 200 MG tablet Take 400 mg by mouth 2 (two) times daily. 11/25/21  Yes [provider]  leflunomide (ARAVA) 20 MG tablet Take 20 mg by mouth daily. 07/15/23  Yes [provider]  lisinopril (ZESTRIL) 10 MG tablet Take 1 tablet (10 mg total) by mouth daily. (NEEDS TO BE SEEN BEFORE NEXT REFILL) 09/04/22  Yes Hawks, Edilia Bo, FNP  Multiple Vitamins-Minerals (ALIVE HAIR, SKIN & NAILS) CHEW Chew 2 tablets by mouth daily.   Yes [provider]  pantoprazole (PROTONIX) 40 MG tablet Take 1 tablet (40 mg total) by mouth daily. 04/07/22  Yes Dolores Frame, MD  triamcinolone cream (KENALOG) 0.1 % Apply 1 Application topically 2 (two) times daily. 09/06/23  Yes [provider]    Inpatient Medications: Scheduled Meds:  budesonide (PULMICORT) nebulizer solution  0.5 mg Nebulization BID   Chlorhexidine Gluconate Cloth  6 each Topical Q0600   dextromethorphan-guaiFENesin  1 tablet Oral BID   enoxaparin (LOVENOX) injection  40 mg Subcutaneous Q24H   levalbuterol  0.63 mg Nebulization Q6H   pantoprazole  40 mg Oral Daily   potassium chloride  20 mEq Oral Once   Continuous Infusions:  diltiazem (CARDIZEM) infusion 5 mg/hr (09/16/23 0823)   PRN Meds: acetaminophen **OR** acetaminophen, guaiFENesin-dextromethorphan, ondansetron **OR** ondansetron (ZOFRAN) IV  Allergies:    Allergies  Allergen Reactions   Statins Other (See Comments)    Simvastatin, crestor, livalo Not able to walk because so  much muscle/joint pain   Bacitracin-Polymyxin B Rash   Neosporin [Neomycin-Bacitracin Zn-Polymyx] Rash    Social History:   Social History   Socioeconomic History   Marital status: Widowed    Spouse name: Not on file   Number of children: 0   Years of education: 32   Highest education level: Some college, no degree  Occupational History   Occupation: Sport and exercise psychologist    Comment: retired   Occupation: Therapist, art    Comment: Retired  Tobacco Use   Smoking status: Former    Current packs/day: 0.00    Average packs/day: 0.5 packs/day for 50.4 years (25.2 ttl pk-yrs)    Types: Cigarettes    Start date: 07/27/1957    Quit  date: 12/15/2007    Years since quitting: 15.7   Smokeless tobacco: Never  Vaping Use   Vaping status: Never Used  Substance and Sexual Activity   Alcohol use: Not Currently    Comment: rare   Drug use: No   Sexual activity: Not Currently    Birth control/protection: Surgical  Other Topics Concern   Not on file  Social History Narrative   Not on file   Social Drivers of Health   Financial Resource Strain: Low Risk  (09/13/2019)   Overall Financial Resource Strain (CARDIA)    Difficulty of Paying Living Expenses: Not hard at all  Food Insecurity: No Food Insecurity (09/15/2023)   Hunger Vital Sign    Worried About Running Out of Food in the Last Year: Never true    Ran Out of Food in the Last Year: Never true  Transportation Needs: Unknown (09/15/2023)   PRAPARE - Administrator, Civil Service (Medical): No    Lack of Transportation (Non-Medical): Not on file  Physical Activity: Inactive (09/13/2019)   Exercise Vital Sign    Days of Exercise per Week: 0 days    Minutes of Exercise per Session: 0 min  Stress: No Stress Concern Present (09/13/2019)   [employer] of Occupational Health - Occupational Stress Questionnaire    Feeling of Stress : Not at all  Social Connections: Moderately Integrated (09/15/2023)   Social Connection and Isolation Panel [NHANES]    Frequency of Communication with Friends and Family: More than three times a week    Frequency of Social Gatherings with Friends and Family: More than three times a week    Attends Religious Services: More than 4 times per year    Active Member of [employer] or Organizations: Yes    Attends Banker Meetings: More than 4 times per year    Marital Status: Widowed  Intimate Partner Violence: Not At Risk (09/15/2023)   Humiliation, Afraid, Rape, and Kick questionnaire    Fear of Current or Ex-Partner: No    Emotionally Abused: No    Physically Abused: No    Sexually Abused: No    Family History:     Family History  Problem Relation Age of Onset   Cancer Mother        colon   Cancer Father        lung   Cancer Sister        lung cancer-nonsmoker   Alzheimer's disease Brother 71     ROS:  Please see the history of present illness.   All other ROS reviewed and negative.     Physical Exam/Data:   Vitals:   09/16/23 0249 09/16/23 0442 09/16/23 0700 09/16/23 0845  BP:  (!) 114/49 124/62  Pulse: 84 91 (!) 135 (!) 115  Resp:  18 18 (!) 24  Temp:  98.1 F (36.7 C) 98.4 F (36.9 C)   TempSrc:  Oral Oral   SpO2: 95% 95% 94% 96%  Weight:      Height:        Intake/Output Summary (Last 24 hours) at 09/16/2023 1015 Last data filed at 09/15/2023 2023 Gross per 24 hour  Intake 500 ml  Output --  Net 500 ml      09/15/2023    5:00 PM 04/03/2022   11:07 AM 02/23/2022   10:54 AM  Last 3 Weights  Weight (lbs) 206 lb 9.1 oz 206 lb 12.7 oz 206 lb 12.8 oz  Weight (kg) 93.7 kg 93.8 kg 93.804 kg     Body mass index is 31.41 kg/m.  General:  Elderly female appearing uncomfortable while sitting on stretcher. Reports back pain and unable to get comfortable.  HEENT: normal Neck: no JVD Vascular: No carotid bruits; Distal pulses 2+ bilaterally Cardiac:  normal S1, S2; Irregularly irregular.  Lungs: No wheezing.  Rhonchi throughout. Abd: soft, nontender, no hepatomegaly  Ext: no pitting edema Musculoskeletal:  No deformities, BUE and BLE strength normal and equal Skin: warm and dry  Neuro:  CNs 2-12 intact, no focal abnormalities noted Psych:  Normal affect   EKG:  The EKG was personally reviewed and demonstrates: Atrial fibrillation with RVR, heart rate 137.  Relevant CV Studies:  Echocardiogram: Pending  Laboratory Data:  High Sensitivity Troponin:  No results for input(s): "TROPONINIHS" in the last 720 hours.   Chemistry Recent Labs  Lab 09/15/23 1740 09/16/23 0439  NA 135 136  K 3.8 3.5  CL 97* 99  CO2 24 26  GLUCOSE 108* 93  BUN 19 21  CREATININE 0.94 0.97   CALCIUM 8.8* 8.2*  MG  --  1.9  GFRNONAA 59* 57*  ANIONGAP 14 11    Recent Labs  Lab 09/15/23 1740 09/16/23 0439  PROT 7.1 6.2*  ALBUMIN 3.5 3.0*  AST 34 30  ALT 24 20  ALKPHOS 76 63  BILITOT 1.1 1.1   Lipids No results for input(s): "CHOL", "TRIG", "HDL", "LABVLDL", "LDLCALC", "CHOLHDL" in the last 168 hours.  Hematology Recent Labs  Lab 09/15/23 1740 09/16/23 0439  WBC 6.8 6.7  RBC 4.86 4.70  HGB 14.8 14.2  HCT 45.9 45.4  MCV 94.4 96.6  MCH 30.5 30.2  MCHC 32.2 31.3  RDW 12.8 12.6  PLT 164 143*   Thyroid  Recent Labs  Lab 09/16/23 0858  TSH 1.064    BNP Recent Labs  Lab 09/16/23 0858  BNP 57.0    DDimer No results for input(s): "DDIMER" in the last 168 hours.   Radiology/Studies:  DG Chest 2 View Result Date: 09/15/2023 CLINICAL DATA:  Shortness of breath patient reports flu like symptoms. Hypoxia. EXAM: CHEST - 2 VIEW COMPARISON:  10/08/2021 FINDINGS: Chronic hyperinflation. Mild bronchial thickening. Ill-defined opacity in the lung bases suggestive of atelectasis and possible trace effusions. Stable heart size and mediastinal contours. Aortic atherosclerosis. No pulmonary edema. No pneumothorax. Reverse right shoulder arthroplasty. IMPRESSION: 1. Chronic hyperinflation with mild bronchial thickening. 2. Ill-defined opacity in the lung bases suggestive of atelectasis and possible trace effusions. Electronically Signed   By: Narda Rutherford M.D.   On: 09/15/2023 17:25     Assessment and Plan:   1. Atrial Fibrillation/Flutter with RVR - New diagnosis for the patient in the setting of influenza A. No known history of  the arrhythmia.  She is overall asymptomatic at this time. Initial rhythm looked more consistent with atrial fibrillation but appears to have more flutter waves now that her heart rate has improved. Will continue IV Cardizem for now as heart rate is in the low 100's to 110's with this. If she develops worsening tachycardia and BP does not allow  for further titration of IV Cardizem, would need to consider a short course of IV Amiodarone. - We reviewed risks and benefits of anticoagulation as she was concerned about this given her history of bleeding in the past in the setting of a gastric ulcer but she was on pain medication and DVT prophylaxis at that time as well. She is open to trying anticoagulation and we reviewed we can initially try IV Heparin to make sure she tolerates this well and then likely switch to Eliquis prior to discharge. Repeat echocardiogram is pending but would wait until rates improve prior to obtaining this.   2.  Influenza A - Temperature peaked at 100.7 and positive for Influenza A on admission. She declined Tamiflu by review of notes. Management per the admitting team.  3. HTN - On HCTZ 25 mg daily prior to admission. Hold this to allow for further titration of AV nodal blocking agents.   Risk Assessment/Risk Scores:        CHA2DS2-VASc Score = 4   This indicates a 4.8% annual risk of stroke. The patient's score is based upon: CHF History: 0 HTN History: 1 Diabetes History: 0 Stroke History: 0 Vascular Disease History: 0 Age Score: 2 Gender Score: 1     For questions or updates, please contact [facility] Please consult [email or web address] for contact info under    Signed, Ellsworth Lennox, PA-C  09/16/2023 10:15 AM  Attending Note   Patient seen and discussed with PA Iran Ouch, I agree with her documentation. 88 yo female hstory of HTN, GERD, presented with fever, body aches, and cough. Found to be flu + on presentation. On evaluation found to be tachycardic and in aflutter with RVR, a new diagnosis for her, cardiology consulted to help manage. She denies any specific palpitations.    WBC 6.8 Hgb 14.8 Plt 164 K 3.8 Cr 0.94 BUN 19  BNP 57 TSH 1.064 Flu A + EKG aflutter with RVR CXR ill defined opacity bilateral lung bases    1.Aflutter/afib - new diagnosis this admission in  setting of influenza - initial single lead telemetry looked like afib, later tele and 12 lead consistent with aflutter.  - started on dilt gtt in ER, she has converted back to SR as of this afternoon - start oral lopressor 25mg  every 6 hours with hold parameters.  - CHADS2Vasc score is at least 4 (age x 2, gender, HTN). Started on heparin gtt. Transition to oral DOAC tomorrow. Would consider outpatient monitor at f/u to see if any recurrences, this may be isolated to her systemic illness/influenza - f/u echo  - history of coffee ground emesnsis 12/2021. EGD with nonbleeding ulcers. At the time was on xarelto for DVT prophylaxis after hip replacement, also on ibuprofen at the time. From GI note ulcer was NSAID induced - would be ok for anticoagulation at this time.   Dina Rich MD

## 2023-09-16 NOTE — Hospital Course (Addendum)
 88 year old female with history of hypertension, GERD/PUD, CKD stage III, seropositive rheumatoid arthritis, hyperlipidemia, impaired glucose tolerance, and vitamin D deficiency presenting with 5-day history of generalized weakness, nonproductive cough, myalgias, arthralgias, and fevers and chills.  The patient progressively felt worse over the last 1 to 2 days.  She had some shortness of breath.  She complained of some loose stools without any hematochezia or melena.  She denies any chest pain or abdominal pain.  She denies any hemoptysis.  She denies any headache or neck pain.  She has some nausea without any emesis.  Her appetite has been poor. Patient endorses previous history of tobacco.  She quit 16 years ago after approximately 15-20-pack-year history. In the ED, the patient had a temperature up to 100.7 F.  She was hemodynamically stable with oxygen saturation 85% on room air.  She was placed on 2 L with saturation 92-94%. WBC 6.8, hemoglobin 14.8, platelets 168.  Sodium 135, potassium 3.8, bicarbonate 24, serum creatinine 0.94.  LFTs were unremarkable.  The patient was given a 500 cc normal saline bolus in the ED.  The patient was offered oseltamavir, but she deferred expressing concern regarding side effects.  While in the emergency department, the patient developed atrial fibrillation with RVR.  She was started on diltiazem drip.  She converted back to sinus later in the day.  She was transitioned to po metoprolol.  Cardiology was consulted to assist with management. She was started on apixaban without complications.

## 2023-09-16 NOTE — Telephone Encounter (Signed)

## 2023-09-16 NOTE — Progress Notes (Signed)
PHARMACY - ANTICOAGULATION CONSULT NOTE  Pharmacy Consult for heparin Indication: atrial fibrillation  Allergies  Allergen Reactions   Statins Other (See Comments)    Simvastatin, crestor, livalo Not able to walk because so much muscle/joint pain   Bacitracin-Polymyxin B Rash   Neosporin [Neomycin-Bacitracin Zn-Polymyx] Rash    Patient Measurements: Height: 5\' 8"  (172.7 cm) Weight: 93.7 kg (206 lb 9.1 oz) IBW/kg (Calculated) : 63.9 Heparin Dosing Weight: 84 kg  Vital Signs: Temp: 98.4 F (36.9 C) (02/20 0700) Temp Source: Oral (02/20 0700) BP: 124/62 (02/20 0700) Pulse Rate: 115 (02/20 0845)  Labs: Recent Labs    09/15/23 1740 09/16/23 0439  HGB 14.8 14.2  HCT 45.9 45.4  PLT 164 143*  CREATININE 0.94 0.97    Estimated Creatinine Clearance: 50.7 mL/min (by C-G formula based on SCr of 0.97 mg/dL).   Medical History: Past Medical History:  Diagnosis Date   Ankle fracture fx 13 yrs ago, anklw swells off and on, has cramps in ankle also   left, to seee dr hewitt about 02-16-14   Arthritis    osteo and RA   Basal cell carcinoma 2015   generalized   Difficult intubation    GERD (gastroesophageal reflux disease)    OTC med   pt. denies   Hypertension    Pneumonia    Pre-diabetes     Medications:  No current facility-administered medications on file prior to encounter.   Current Outpatient Medications on File Prior to Encounter  Medication Sig Dispense Refill   acetaminophen (TYLENOL) 325 MG tablet Take 2 tablets (650 mg total) by mouth every 6 (six) hours as needed for mild pain (or Fever >/= 101). 15 tablet 0   Albuterol Sulfate (PROAIR RESPICLICK) 108 (90 Base) MCG/ACT AEPB Inhale 1-2 puffs into the lungs every 6 (six) hours as needed (shortness of breath).     allopurinol (ZYLOPRIM) 100 MG tablet Take 100 mg by mouth daily.     clobetasol ointment (TEMOVATE) 0.05 % Apply 1 Application topically 2 (two) times daily.     D3-50 1.25 MG (50000 UT) capsule Take  50,000 Units by mouth every Sunday.     diphenhydramine-acetaminophen (TYLENOL PM EXTRA STRENGTH) 25-500 MG TABS tablet Take 1 tablet by mouth at bedtime.     hydrochlorothiazide (HYDRODIURIL) 25 MG tablet Take 0.5 tablets (12.5 mg total) by mouth daily. (Patient taking differently: Take 25 mg by mouth daily.) 15 tablet 2   hydroxychloroquine (PLAQUENIL) 200 MG tablet Take 400 mg by mouth 2 (two) times daily.     leflunomide (ARAVA) 20 MG tablet Take 20 mg by mouth daily.     lisinopril (ZESTRIL) 10 MG tablet Take 1 tablet (10 mg total) by mouth daily. (NEEDS TO BE SEEN BEFORE NEXT REFILL) 30 tablet 0   Multiple Vitamins-Minerals (ALIVE HAIR, SKIN & NAILS) CHEW Chew 2 tablets by mouth daily.     pantoprazole (PROTONIX) 40 MG tablet Take 1 tablet (40 mg total) by mouth daily. 180 tablet 3   triamcinolone cream (KENALOG) 0.1 % Apply 1 Application topically 2 (two) times daily.     Assessment: Pharmacy consulted to dose heparin in patient with atrial fibrillation. Patient is not on anticoagulation prior to admission.  Goal of Therapy:  Heparin level 0.3-0.7 units/ml Monitor platelets by anticoagulation protocol: Yes   Plan:  Give 4500 units bolus x 1 Start heparin infusion at 1250 units/hr Check anti-Xa level in 6-8 hours and daily while on heparin Continue to monitor H&H and platelets  [facility] Student Pharm D 09/16/2023,10:44 AM

## 2023-09-16 NOTE — ED Notes (Signed)
Pt assisted with using bedside toilet, then back to bed.

## 2023-09-16 NOTE — Consult Note (Addendum)
PHARMACY - ANTICOAGULATION CONSULT NOTE  Pharmacy Consult for IV Heparin Indication: atrial fibrillation  Patient Measurements: Height: 5\' 8"  (172.7 cm) Weight: 96.1 kg (211 lb 13.8 oz) IBW/kg (Calculated) : 63.9 Heparin Dosing Weight: 84.7 kg  Labs: Recent Labs    09/15/23 1740 09/16/23 0439 09/16/23 2031  HGB 14.8 14.2  --   HCT 45.9 45.4  --   PLT 164 143*  --   HEPARINUNFRC  --   --  0.95*  CREATININE 0.94 0.97  --     Estimated Creatinine Clearance: 51.4 mL/min (by C-G formula based on SCr of 0.97 mg/dL).  Medications:  No anticoagulation prior to admission per my chart review  Assessment: 88 y/o F with medical history including hypertension, GERD admitted with influenza A infection. Admission complicated by new onset Aflutter / fib. Cardiology is following and patient is currently on a heparin drip with plans to convert to apixaban 09/17/23.  0220 2031 HL 0.95, suprathera; 1250 un/hr  Goal of Therapy:  Heparin level 0.3-0.7 units/ml Monitor platelets by anticoagulation protocol: Yes   Plan:  --Heparin level is supratherapeutic --Decrease heparin infusion to 1100 units/hr --Re-check HL 8 hours from rate change --Daily CBC per protocol while on IV heparin --F/u transition to apixaban 09/17/23  Tressie Ellis 09/16/2023,9:21 PM

## 2023-09-16 NOTE — Progress Notes (Signed)
PROGRESS NOTE  Mary Moss UJW:[id number] DOB: 08/05/1935 DOA: 09/15/2023 PCP: Adrian Prince, MD  Brief History:  88 year old female with history of hypertension, GERD/PUD, CKD stage III, seropositive rheumatoid arthritis, hyperlipidemia, impaired glucose tolerance, and vitamin D deficiency presenting with 5-day history of generalized weakness, nonproductive cough, myalgias, arthralgias, and fevers and chills.  The patient progressively felt worse over the last 1 to 2 days.  She had some shortness of breath.  She complained of some loose stools without any hematochezia or melena.  She denies any chest pain or abdominal pain.  She denies any hemoptysis.  She denies any headache or neck pain.  She has some nausea without any emesis.  Her appetite has been poor. Patient endorses previous history of tobacco.  She quit 16 years ago after approximately 15-20-pack-year history. In the ED, the patient had a temperature up to 100.7 F.  She was hemodynamically stable with oxygen saturation 85% on room air.  She was placed on 2 L with saturation 92-94%. WBC 6.8, hemoglobin 14.8, platelets 168.  Sodium 135, potassium 3.8, bicarbonate 24, serum creatinine 0.94.  LFTs were unremarkable.  The patient was given a 500 cc normal saline bolus in the ED.  The patient was offered oseltamavir, but she deferred expressing concern regarding side effects.  While in the emergency department, the patient developed atrial fibrillation with RVR.  She was started on diltiazem drip.    Assessment/Plan: Acute respiratory failure with hypoxia -Secondary to influenza -Initially placed on 4 L nasal cannula -Wean oxygen for saturation greater 90%  Influenza -Patient deferred taking oseltamavir after discussion of the risks, benefits, and alternatives -Continue supportive care -xopenex -pulmicort  New onset atrial fibrillation with RVR, type unspecified -Start diltiazem  drip -Echocardiogram -TSH -Optimize electrolytes -CHADSVASc = 4 (age x 2, female, HTN) -pt expresses concern regarding anticoagulation -consult cardiology  GERD -hx of PUD -continue pantoprazole  Seropositive RA -receives golinumab every 2 months--last dose 08/31/23 -unclear if patient is still on leflunomide and hydroxychloroquine--daughter to bring list of meds  Essential HTN -holding lisinopril and hydrochlorothiazide to allow BP margin for rate control of Afib  Gout -restart allopurinol once med rec is verified  Thrombocytopenia -due to viral infection -monitor CBC         Family Communication: daughter updated 09/16/23  Consultants:  cardiology  Code Status:  FULL / DNR  DVT Prophylaxis:  [place] Lovenox pending pt decision on AC   Procedures: As Listed in Progress Note Above  Antibiotics: None      Subjective: Patient still feels weak.  She has nonproductive cough.  She has some shortness of breath but that is improving.  She has some nausea without emesis.  She denies any diarrhea, abdominal pain, chest pain.  Objective: Vitals:   09/16/23 0200 09/16/23 0249 09/16/23 0442 09/16/23 0700  BP: (!) 119/46  (!) 114/49 124/62  Pulse: 87 84 91 (!) 135  Resp: 18  18 18   Temp:   98.1 F (36.7 C) 98.4 F (36.9 C)  TempSrc:   Oral Oral  SpO2: 90% 95% 95% 94%  Weight:      Height:        Intake/Output Summary (Last 24 hours) at 09/16/2023 0805 Last data filed at 09/15/2023 2023 Gross per 24 hour  Intake 500 ml  Output --  Net 500 ml   Weight change:  Exam:  General:  Pt is alert, follows commands appropriately, not in acute distress  HEENT: No icterus, No thrush, No neck mass, [place]/AT Cardiovascular: IRRR, S1/S2, no rubs, no gallops Respiratory: Bibasilar rales.  No wheezing.  Good air movement Abdomen: Soft/+BS, non tender, non distended, no guarding Extremities: No edema, No lymphangitis, No petechiae, No rashes, no synovitis   Data Reviewed: I  have personally reviewed following labs and imaging studies Basic Metabolic Panel: Recent Labs  Lab 09/15/23 1740 09/16/23 0439  NA 135 136  K 3.8 3.5  CL 97* 99  CO2 24 26  GLUCOSE 108* 93  BUN 19 21  CREATININE 0.94 0.97  CALCIUM 8.8* 8.2*  MG  --  1.9  PHOS  --  3.4   Liver Function Tests: Recent Labs  Lab 09/15/23 1740 09/16/23 0439  AST 34 30  ALT 24 20  ALKPHOS 76 63  BILITOT 1.1 1.1  PROT 7.1 6.2*  ALBUMIN 3.5 3.0*   No results for input(s): "LIPASE", "AMYLASE" in the last 168 hours. No results for input(s): "AMMONIA" in the last 168 hours. Coagulation Profile: No results for input(s): "INR", "PROTIME" in the last 168 hours. CBC: Recent Labs  Lab 09/15/23 1740 09/16/23 0439  WBC 6.8 6.7  NEUTROABS 5.5  --   HGB 14.8 14.2  HCT 45.9 45.4  MCV 94.4 96.6  PLT 164 143*   Cardiac Enzymes: No results for input(s): "CKTOTAL", "CKMB", "CKMBINDEX", "TROPONINI" in the last 168 hours. BNP: Invalid input(s): "POCBNP" CBG: No results for input(s): "GLUCAP" in the last 168 hours. HbA1C: No results for input(s): "HGBA1C" in the last 72 hours. Urine analysis:    Component Value Date/Time   COLORURINE YELLOW 11/16/2019 1503   APPEARANCEUR HAZY (A) 11/16/2019 1503   LABSPEC 1.020 11/16/2019 1503   PHURINE 5.0 11/16/2019 1503   GLUCOSEU NEGATIVE 11/16/2019 1503   HGBUR NEGATIVE 11/16/2019 1503   BILIRUBINUR NEGATIVE 11/16/2019 1503   BILIRUBINUR neg 07/03/2015 0846   KETONESUR NEGATIVE 11/16/2019 1503   PROTEINUR NEGATIVE 11/16/2019 1503   UROBILINOGEN negative 07/03/2015 0846   NITRITE NEGATIVE 11/16/2019 1503   LEUKOCYTESUR TRACE (A) 11/16/2019 1503   Sepsis Labs: @LABRCNTIP (procalcitonin:4,lacticidven:4) ) Recent Results (from the past 240 hours)  Resp panel by RT-PCR (RSV, Flu A&B, Covid) Anterior Nasal Swab     Status: Abnormal   Collection Time: 09/15/23  4:55 PM   Specimen: Anterior Nasal Swab  Result Value Ref Range Status   SARS Coronavirus 2  by RT PCR NEGATIVE NEGATIVE Final    Comment: (NOTE) SARS-CoV-2 target nucleic acids are NOT DETECTED.  The SARS-CoV-2 RNA is generally detectable in upper respiratory specimens during the acute phase of infection. The lowest concentration of SARS-CoV-2 viral copies this assay can detect is 138 copies/mL. A negative result does not preclude SARS-Cov-2 infection and should not be used as the sole basis for treatment or other patient management decisions. A negative result may occur with  improper specimen collection/handling, submission of specimen other than nasopharyngeal swab, presence of viral mutation(s) within the areas targeted by this assay, and inadequate number of viral copies(<138 copies/mL). A negative result must be combined with clinical observations, patient history, and epidemiological information. The expected result is Negative.  Fact Sheet for Patients:  BloggerCourse.com  Fact Sheet for Healthcare Providers:  SeriousBroker.it  This test is no t yet approved or cleared by the [place] FDA and  has been authorized for detection and/or diagnosis of SARS-CoV-2 by FDA under an Emergency Use Authorization (EUA). This EUA will remain  in effect (meaning this test can be used) for  the duration of the COVID-19 declaration under Section 564(b)(1) of the Act, 21 U.S.C.section 360bbb-3(b)(1), unless the authorization is terminated  or revoked sooner.       Influenza A by PCR POSITIVE (A) NEGATIVE Final   Influenza B by PCR NEGATIVE NEGATIVE Final    Comment: (NOTE) The Xpert Xpress SARS-CoV-2/FLU/RSV plus assay is intended as an aid in the diagnosis of influenza from Nasopharyngeal swab specimens and should not be used as a sole basis for treatment. Nasal washings and aspirates are unacceptable for Xpert Xpress SARS-CoV-2/FLU/RSV testing.  Fact Sheet for Patients: BloggerCourse.com  Fact  Sheet for Healthcare Providers: SeriousBroker.it  This test is not yet approved or cleared by the [place] FDA and has been authorized for detection and/or diagnosis of SARS-CoV-2 by FDA under an Emergency Use Authorization (EUA). This EUA will remain in effect (meaning this test can be used) for the duration of the COVID-19 declaration under Section 564(b)(1) of the Act, 21 U.S.C. section 360bbb-3(b)(1), unless the authorization is terminated or revoked.     Resp Syncytial Virus by PCR NEGATIVE NEGATIVE Final    Comment: (NOTE) Fact Sheet for Patients: BloggerCourse.com  Fact Sheet for Healthcare Providers: SeriousBroker.it  This test is not yet approved or cleared by the [place] FDA and has been authorized for detection and/or diagnosis of SARS-CoV-2 by FDA under an Emergency Use Authorization (EUA). This EUA will remain in effect (meaning this test can be used) for the duration of the COVID-19 declaration under Section 564(b)(1) of the Act, 21 U.S.C. section 360bbb-3(b)(1), unless the authorization is terminated or revoked.  Performed at [facility], [address], [place], [place] [address]      Scheduled Meds:  dextromethorphan-guaiFENesin  1 tablet Oral BID   diltiazem  5 mg Intravenous Once   enoxaparin (LOVENOX) injection  40 mg Subcutaneous Q24H   levalbuterol  0.63 mg Nebulization Q6H   pantoprazole  40 mg Oral Daily   Continuous Infusions:  diltiazem (CARDIZEM) infusion      Procedures/Studies: DG Chest 2 View Result Date: 09/15/2023 CLINICAL DATA:  Shortness of breath patient reports flu like symptoms. Hypoxia. EXAM: CHEST - 2 VIEW COMPARISON:  10/08/2021 FINDINGS: Chronic hyperinflation. Mild bronchial thickening. Ill-defined opacity in the lung bases suggestive of atelectasis and possible trace effusions. Stable heart size and mediastinal contours. Aortic atherosclerosis.  No pulmonary edema. No pneumothorax. Reverse right shoulder arthroplasty. IMPRESSION: 1. Chronic hyperinflation with mild bronchial thickening. 2. Ill-defined opacity in the lung bases suggestive of atelectasis and possible trace effusions. Electronically Signed   By: Narda Rutherford M.D.   On: 09/15/2023 17:25    Catarina Hartshorn, DO  [facility]  If 7PM-7AM, please contact night-coverage [email or web address] Password [email or web address] 09/16/2023, 8:05 AM   LOS: 1 day

## 2023-09-16 NOTE — ED Notes (Signed)
Sudden increase in HR noted at 0730. A-fib, RVR with  occasional/frequent polymorphic PVCs noted. Rate 140-160. Patient denies complaints of breathing problems, chest pain, dizziness, and other new complaints.

## 2023-09-17 ENCOUNTER — Inpatient Hospital Stay (HOSPITAL_COMMUNITY): Payer: HMO

## 2023-09-17 DIAGNOSIS — I4891 Unspecified atrial fibrillation: Secondary | ICD-10-CM | POA: Diagnosis not present

## 2023-09-17 DIAGNOSIS — I10 Essential (primary) hypertension: Secondary | ICD-10-CM | POA: Diagnosis not present

## 2023-09-17 DIAGNOSIS — I480 Paroxysmal atrial fibrillation: Secondary | ICD-10-CM | POA: Diagnosis not present

## 2023-09-17 DIAGNOSIS — R6510 Systemic inflammatory response syndrome (SIRS) of non-infectious origin without acute organ dysfunction: Secondary | ICD-10-CM | POA: Diagnosis not present

## 2023-09-17 DIAGNOSIS — J101 Influenza due to other identified influenza virus with other respiratory manifestations: Secondary | ICD-10-CM | POA: Diagnosis not present

## 2023-09-17 LAB — ECHOCARDIOGRAM COMPLETE
AR max vel: 2.12 cm2
AV Area VTI: 2.21 cm2
AV Area mean vel: 2.01 cm2
AV Mean grad: 6 mm[Hg]
AV Peak grad: 11 mm[Hg]
Ao pk vel: 1.66 m/s
Area-P 1/2: 4.8 cm2
Calc EF: 60 %
Height: 68 in
MV VTI: 2.31 cm2
S' Lateral: 3 cm
Single Plane A2C EF: 62.2 %
Single Plane A4C EF: 57.1 %
Weight: 3389.79 [oz_av]

## 2023-09-17 LAB — BASIC METABOLIC PANEL
Anion gap: 8 (ref 5–15)
BUN: 19 mg/dL (ref 8–23)
CO2: 25 mmol/L (ref 22–32)
Calcium: 8.3 mg/dL — ABNORMAL LOW (ref 8.9–10.3)
Chloride: 101 mmol/L (ref 98–111)
Creatinine, Ser: 0.88 mg/dL (ref 0.44–1.00)
GFR, Estimated: >60 mL/min (ref >60)
Glucose, Bld: 95 mg/dL (ref 70–99)
Potassium: 4.1 mmol/L (ref 3.5–5.1)
Sodium: 134 mmol/L — ABNORMAL LOW (ref 135–145)

## 2023-09-17 LAB — CBC
HCT: 40.9 % (ref 36.0–46.0)
Hemoglobin: 13.4 g/dL (ref 12.0–15.0)
MCH: 31 pg (ref 26.0–34.0)
MCHC: 32.8 g/dL (ref 30.0–36.0)
MCV: 94.7 fL (ref 80.0–100.0)
Platelets: 149 10*3/uL — ABNORMAL LOW (ref 150–400)
RBC: 4.32 MIL/uL (ref 3.87–5.11)
RDW: 12.8 % (ref 11.5–15.5)
WBC: 3.6 10*3/uL — ABNORMAL LOW (ref 4.0–10.5)
nRBC: 0 % (ref 0.0–0.2)

## 2023-09-17 LAB — HEPARIN LEVEL (UNFRACTIONATED): Heparin Unfractionated: 0.8 [IU]/mL — ABNORMAL HIGH (ref 0.30–0.70)

## 2023-09-17 LAB — MAGNESIUM: Magnesium: 2.1 mg/dL (ref 1.7–2.4)

## 2023-09-17 MED ORDER — METOPROLOL TARTRATE 25 MG PO TABS
37.5000 mg | ORAL_TABLET | Freq: Two times a day (BID) | ORAL | Status: DC
  Administered 2023-09-17 – 2023-09-22 (×11): 37.5 mg via ORAL
  Filled 2023-09-17 (×11): qty 2

## 2023-09-17 MED ORDER — APIXABAN 5 MG PO TABS
5.0000 mg | ORAL_TABLET | Freq: Two times a day (BID) | ORAL | Status: DC
  Administered 2023-09-17 – 2023-09-22 (×11): 5 mg via ORAL
  Filled 2023-09-17: qty 1
  Filled 2023-09-17: qty 2
  Filled 2023-09-17 (×10): qty 1

## 2023-09-17 NOTE — TOC Initial Note (Signed)
Transition of Care [facility]) - Initial/Assessment Note    Patient Details  Name: Mary Moss MRN: [id number] Date of Birth: 1936-02-12  [facility] CM/SW Contact:    Villa Herb, LCSWA Phone Number: 09/17/2023, 1:25 PM  Clinical Narrative:                 CSW updated that PT is recommending SNF for pt at D/C. CSW met with pt and daughter Mary Moss at bedside to review. Pt states she prefers to return home with [facility] services instead of going to SNF. Pt states that she is agreeable to a SNF referral but still may want to go home with [facility] PT instead. CSW explained that referral can be sent out and pt can still go home. Pt is agreeable to this. TOC to follow.   Expected Discharge Plan: Skilled Nursing Facility Barriers to Discharge: Continued Medical Work up   Patient Goals and CMS Choice Patient states their goals for this hospitalization and ongoing recovery are:: get better CMS Medicare.gov Compare Post Acute Care list provided to:: Patient Choice offered to / list presented to : Patient      Expected Discharge Plan and Services In-house Referral: Clinical Social Work Discharge Planning Services: CM Consult Post Acute Care Choice:  (still deciding) Living arrangements for the past 2 months: Single Family Home                                      Prior Living Arrangements/Services Living arrangements for the past 2 months: Single Family Home Lives with:: Self Patient language and need for interpreter reviewed:: Yes Do you feel safe going back to the place where you live?: Yes      Need for Family Participation in Patient Care: Yes (Comment) Care giver support system in place?: Yes (comment) Current home services: DME Criminal Activity/Legal Involvement Pertinent to Current Situation/Hospitalization: No - Comment as needed  Activities of Daily Living   ADL Screening (condition at time of admission) Independently performs ADLs?: Yes (appropriate for  developmental age) Is the patient deaf or have difficulty hearing?: No Does the patient have difficulty seeing, even when wearing glasses/contacts?: No Does the patient have difficulty concentrating, remembering, or making decisions?: No  Permission Sought/Granted                  Emotional Assessment Appearance:: Appears stated age Attitude/Demeanor/Rapport: Engaged Affect (typically observed): Accepting Orientation: : Oriented to Self, Oriented to Place, Oriented to  Time, Oriented to Situation Alcohol / Substance Use: Not Applicable Psych Involvement: No (comment)  Admission diagnosis:  Influenza A [J10.1] Hypoxia [R09.02] Acute respiratory failure with hypoxia (HCC) [J96.01] Patient Active Problem List   Diagnosis Date Noted   Paroxysmal atrial fibrillation with RVR (HCC) 09/16/2023   Influenza A 09/15/2023   SIRS (systemic inflammatory response syndrome) (HCC) 09/15/2023   Acute respiratory failure with hypoxia (HCC) 09/15/2023   Atelectasis 09/15/2023   GERD (gastroesophageal reflux disease) 09/15/2023   Gastric ulcer 02/23/2022   Symptomatic anemia 01/21/2022   S/P total left hip arthroplasty 01/20/2022   Seropositive rheumatoid arthritis (HCC) 01/20/2022   Osteoarthritis of left hip 12/31/2021   Degeneration of lumbar intervertebral disc 08/09/2021   Status post reverse arthroplasty of right shoulder 11/23/2019   Morbid obesity (HCC) 11/18/2018   OA (osteoarthritis) of knee 06/08/2016   Arthritis of knee, right 01/31/2016   Hyperlipidemia 09/02/2015   Pre-diabetes  07/12/2015   Essential hypertension 07/03/2015   Post-traumatic arthritis of ankle 12/20/2014   Acute medial meniscal tear 02/06/2014   Low bone mass 12/14/2012   GAD (generalized anxiety disorder) 12/14/2012   Vitamin D deficiency 12/14/2012   PCP:  Adrian Prince, MD Pharmacy:   [id number] - [place], [place] - [address] [place] [address] Phone:  [phone] Fax: [phone]     Social Drivers of Health (SDOH) Social History: SDOH Screenings   Food Insecurity: No Food Insecurity (09/15/2023)  Housing: Low Risk  (09/15/2023)  Transportation Needs: No Transportation Needs (09/16/2023)  Utilities: Not At Risk (09/15/2023)  Depression (PHQ2-9): Medium Risk (01/30/2022)  Financial Resource Strain: Low Risk  (09/13/2019)  Physical Activity: Inactive (09/13/2019)  Social Connections: Moderately Integrated (09/15/2023)  Stress: No Stress Concern Present (09/13/2019)  Tobacco Use: Medium Risk (09/15/2023)   SDOH Interventions:     Readmission Risk Interventions     No data to display

## 2023-09-17 NOTE — Evaluation (Signed)
Physical Therapy Evaluation Patient Details Name: Mary Moss MRN: [id number] DOB: 25-Aug-1935 Today's Date: 09/17/2023  History of Present Illness  Mary Moss is an 88 y.o. female with medical history significant of hypertension, GERD who presents to the emergency department due to 4-day onset of cold symptoms, nonproductive cough, subjective fever, generalized weakness, body aches, shortness of breath which worsens on ambulation she denies any sick contact.   Clinical Impression  Patient demonstrates fair/good return for getting into/out of bed with slightly labored movement, unable to maintain standing balance without AD due to BLE weakness, required use of RW for ambulating in room without loss of balance, but limited mostly due to fatigue, generalized weakness and required 2 LPM O2 to for SpO2 to stay above 90%. Patient tolerated sitting up in chair after therapy with family members present. Patient will benefit from continued skilled physical therapy in hospital and recommended venue below to increase strength, balance, endurance for safe ADLs and gait.          If plan is discharge home, recommend the following: A little help with walking and/or transfers;A little help with bathing/dressing/bathroom;Help with stairs or ramp for entrance;Assistance with cooking/housework   Can travel by private vehicle   Yes    Equipment Recommendations None recommended by PT  Recommendations for Other Services       Functional Status Assessment Patient has had a recent decline in their functional status and demonstrates the ability to make significant improvements in function in a reasonable and predictable amount of time.     Precautions / Restrictions Precautions Precautions: Fall Restrictions Weight Bearing Restrictions Per Provider Order: No      Mobility  Bed Mobility Overal bed mobility: Modified Independent             General bed mobility comments: slightly  increased time    Transfers Overall transfer level: Needs assistance Equipment used: Rolling walker (2 wheels), None Transfers: Sit to/from Stand, Bed to chair/wheelchair/BSC Sit to Stand: Min assist   Step pivot transfers: Min assist, Mod assist       General transfer comment: very unsteady with near loss of balance during sit to stand, transfers without AD, required use of RW for safety    Ambulation/Gait Ambulation/Gait assistance: Min assist Gait Distance (Feet): 25 Feet Assistive device: Rolling walker (2 wheels) Gait Pattern/deviations: Decreased step length - right, Decreased step length - left, Decreased stride length, Trunk flexed Gait velocity: decreased     General Gait Details: slow labored movement without loss of balance, limited mosty due to c/o fatigue and mild SOB with SpO2 at 93% while on 2 LPM  Stairs            Wheelchair Mobility     Tilt Bed    Modified Rankin (Stroke Patients Only)       Balance Overall balance assessment: Needs assistance Sitting-balance support: Feet supported, No upper extremity supported Sitting balance-Leahy Scale: Fair Sitting balance - Comments: fair/good seated at EOB   Standing balance support: During functional activity, No upper extremity supported Standing balance-Leahy Scale: Poor Standing balance comment: fair using RW                             Pertinent Vitals/Pain Pain Assessment Pain Assessment: No/denies pain    Home Living Family/patient expects to be discharged to:: Private residence Living Arrangements: Alone Available Help at Discharge: Family Type of Home: House Home Access: Ramped entrance  Home Layout: Two level;Able to live on main level with bedroom/bathroom Home Equipment: Rolling Walker (2 wheels);Cane - quad;BSC/3in1;Shower seat;Grab bars - toilet;Grab bars - tub/shower;Wheelchair - manual      Prior Function Prior Level of Function : Independent/Modified  Independent             Mobility Comments: community ambulator, did not use RW or cane but has if needed. Grocery shopping independently ADLs Comments: Independent with bathing, dressing, cooking, cleaning, grocery shopping     Extremity/Trunk Assessment   Upper Extremity Assessment Upper Extremity Assessment: Defer to OT evaluation    Lower Extremity Assessment Lower Extremity Assessment: Overall WFL for tasks assessed    Cervical / Trunk Assessment Cervical / Trunk Assessment: Normal  Communication   Communication Communication: No apparent difficulties    Cognition Arousal: Alert Behavior During Therapy: WFL for tasks assessed/performed   PT - Cognitive impairments: No apparent impairments                         Following commands: Intact       Cueing       General Comments      Exercises     Assessment/Plan    PT Assessment Patient needs continued PT services  PT Problem List Decreased strength;Decreased activity tolerance;Decreased balance;Decreased mobility       PT Treatment Interventions DME instruction;Gait training;Stair training;Functional mobility training;Therapeutic activities;Therapeutic exercise;Balance training;Patient/family education    PT Goals (Current goals can be found in the Care Plan section)  Acute Rehab PT Goals Patient Stated Goal: return home with family to assist PT Goal Formulation: With patient/family Time For Goal Achievement: 10/01/23 Potential to Achieve Goals: Good    Frequency Min 3X/week     Co-evaluation               AM-PAC PT "6 Clicks" Mobility  Outcome Measure Help needed turning from your back to your side while in a flat bed without using bedrails?: None Help needed moving from lying on your back to sitting on the side of a flat bed without using bedrails?: None Help needed moving to and from a bed to a chair (including a wheelchair)?: A Little Help needed standing up from a chair  using your arms (e.g., wheelchair or bedside chair)?: A Little Help needed to walk in hospital room?: A Little Help needed climbing 3-5 steps with a railing? : A Lot 6 Click Score: 19    End of Session Equipment Utilized During Treatment: Oxygen Activity Tolerance: Patient tolerated treatment well Patient left: in chair;with call bell/phone within reach Nurse Communication: Mobility status PT Visit Diagnosis: Unsteadiness on feet (R26.81);Other abnormalities of gait and mobility (R26.89);Muscle weakness (generalized) (M62.81)    Time: 1010-1039 PT Time Calculation (min) (ACUTE ONLY): 29 min   Charges:   PT Evaluation $PT Eval Moderate Complexity: 1 Mod PT Treatments $Therapeutic Activity: 23-37 mins PT General Charges $$ ACUTE PT VISIT: 1 Visit         1:42 PM, 09/17/23 Ocie Bob, MPT Physical Therapist with [facility] [phone] office [phone] mobile phone

## 2023-09-17 NOTE — Progress Notes (Signed)
Rounding Note    Patient Name: Mary Moss Date of Encounter: 09/17/2023  [facility] Cardiologist: New  Subjective   SOB improving  Inpatient Medications    Scheduled Meds:  budesonide (PULMICORT) nebulizer solution  0.5 mg Nebulization BID   Chlorhexidine Gluconate Cloth  6 each Topical Q0600   dextromethorphan-guaiFENesin  1 tablet Oral BID   levalbuterol  0.63 mg Nebulization TID   metoprolol tartrate  25 mg Oral QID   pantoprazole  40 mg Oral Daily   Continuous Infusions:  diltiazem (CARDIZEM) infusion Stopped (09/16/23 1323)   heparin 950 Units/hr (09/17/23 0754)   PRN Meds: acetaminophen **OR** acetaminophen, guaiFENesin-dextromethorphan, levalbuterol, ondansetron **OR** ondansetron (ZOFRAN) IV   Vital Signs    Vitals:   09/17/23 0600 09/17/23 0756 09/17/23 0757 09/17/23 0759  BP: (!) 127/43     Pulse: 73     Resp: (!) 22     Temp:   99.3 F (37.4 C)   TempSrc:   Oral   SpO2: 94% 93%  94%  Weight:      Height:        Intake/Output Summary (Last 24 hours) at 09/17/2023 0827 Last data filed at 09/17/2023 0600 Gross per 24 hour  Intake 959.76 ml  Output --  Net 959.76 ml      09/16/2023   11:05 AM 09/15/2023    5:00 PM 04/03/2022   11:07 AM  Last 3 Weights  Weight (lbs) 211 lb 13.8 oz 206 lb 9.1 oz 206 lb 12.7 oz  Weight (kg) 96.1 kg 93.7 kg 93.8 kg      Telemetry    NSR - Personally Reviewed  ECG    N/a - Personally Reviewed  Physical Exam   GEN: No acute distress.   Neck: No JVD Cardiac: RRR, no murmurs, rubs, or gallops.  Respiratory: coarse bilaterally GI: Soft, nontender, non-distended  MS: No edema; No deformity. Neuro:  Nonfocal  Psych: Normal affect   Labs    High Sensitivity Troponin:  No results for input(s): "TROPONINIHS" in the last 720 hours.   Chemistry Recent Labs  Lab 09/15/23 1740 09/16/23 0439 09/17/23 0535  NA 135 136 134*  K 3.8 3.5 4.1  CL 97* 99 101  CO2 24 26 25   GLUCOSE 108* 93 95   BUN 19 21 19   CREATININE 0.94 0.97 0.88  CALCIUM 8.8* 8.2* 8.3*  MG  --  1.9 2.1  PROT 7.1 6.2*  --   ALBUMIN 3.5 3.0*  --   AST 34 30  --   ALT 24 20  --   ALKPHOS 76 63  --   BILITOT 1.1 1.1  --   GFRNONAA 59* 57* >60  ANIONGAP 14 11 8     Lipids No results for input(s): "CHOL", "TRIG", "HDL", "LABVLDL", "LDLCALC", "CHOLHDL" in the last 168 hours.  Hematology Recent Labs  Lab 09/15/23 1740 09/16/23 0439 09/17/23 0535  WBC 6.8 6.7 3.6*  RBC 4.86 4.70 4.32  HGB 14.8 14.2 13.4  HCT 45.9 45.4 40.9  MCV 94.4 96.6 94.7  MCH 30.5 30.2 31.0  MCHC 32.2 31.3 32.8  RDW 12.8 12.6 12.8  PLT 164 143* 149*   Thyroid  Recent Labs  Lab 09/16/23 0858  TSH 1.064  FREET4 1.05    BNP Recent Labs  Lab 09/16/23 0858  BNP 57.0    DDimer No results for input(s): "DDIMER" in the last 168 hours.   Radiology    DG Chest 2 View Result Date:  09/15/2023 CLINICAL DATA:  Shortness of breath patient reports flu like symptoms. Hypoxia. EXAM: CHEST - 2 VIEW COMPARISON:  10/08/2021 FINDINGS: Chronic hyperinflation. Mild bronchial thickening. Ill-defined opacity in the lung bases suggestive of atelectasis and possible trace effusions. Stable heart size and mediastinal contours. Aortic atherosclerosis. No pulmonary edema. No pneumothorax. Reverse right shoulder arthroplasty. IMPRESSION: 1. Chronic hyperinflation with mild bronchial thickening. 2. Ill-defined opacity in the lung bases suggestive of atelectasis and possible trace effusions. Electronically Signed   By: Narda Rutherford M.D.   On: 09/15/2023 17:25    Cardiac Studies     Patient Profile     Mary Moss is a 88 y.o. female with a hx of HTN, rheumatoid arthritis and GERD who is being seen 09/16/2023 for the evaluation of atrial fibrillation with RVR at the request of Dr. Arbutus Leas.   Assessment & Plan    1.Aflutter/afib - new diagnosis this admission in setting of influenza - initial single lead telemetry looked like afib, later  tele and 12 lead consistent with aflutter.  - started on dilt gtt in ER, she has converted back to SR as of this afternoon - start oral lopressor 25mg  every 6 hours with hold parameters. Diltiazem drip was stopped. - remains in SR today. Change lopressor to 37.5mg  bid.  - CHADS2Vasc score is at least 4 (age x 2, gender, HTN). Started on heparin gtt. Transition to oral DOAC tomorrow. Would consider outpatient monitor at f/u to see if any recurrences, this may be isolated to her systemic illness/influenza - d/c heparin, start eliquis 5mg  bid - f/u echo   - history of coffee ground emesnsis 12/2021. EGD with nonbleeding ulcers. At the time was on xarelto for DVT prophylaxis after hip replacement, also on ibuprofen at the time. From GI note ulcer was NSAID induced - would be ok for anticoagulation at this time.   2. Influenza A - per primary team   We will f/u echo, if benign cardiology will sign off. We will arrange outpatient f/u. Would plan for 30 day monitor arranged at f/u attpt   For questions or updates, please contact [facility] Please consult [email or web address] for contact info under        Signed, Dina Rich, MD  09/17/2023, 8:27 AM

## 2023-09-17 NOTE — Evaluation (Signed)
Occupational Therapy Evaluation Patient Details Name: Mary Moss MRN: [id number] DOB: Apr 06, 1936 Today's Date: 09/17/2023   History of Present Illness   Mary Moss is an 88 y.o. female with medical history significant of hypertension, GERD who presents to the emergency department due to 4-day onset of cold symptoms, nonproductive cough, subjective fever, generalized weakness, body aches, shortness of breath which worsens on ambulation she denies any sick contact.     Clinical Impressions Pt agreeable to OT evaluation. Pt received supine in bed. Pt reports prior to admission that she was independent with mobility, ADLS, and IADLs. She stated that she lives alone but has help from a nearby cousin as needed. She has the following equipment at home: RW, w/c, BSC, cane, shower chair, grab bars in bathroom. She completed bed mobility with SBA. Pt completes sit to stand t/f with min HHA. Trialed pt off of O2 but O2 dropped to 86 while standing, placed O2 back on and it came back up to 92%. Pt completes t/f from chair to [facility] with min HHA. Pt does not require further OT services at this time.       If plan is discharge home, recommend the following:   A little help with walking and/or transfers;A little help with bathing/dressing/bathroom     Functional Status Assessment   Patient has had a recent decline in their functional status and demonstrates the ability to make significant improvements in function in a reasonable and predictable amount of time.     Equipment Recommendations   None recommended by OT      Precautions/Restrictions   Precautions Precautions: Fall Restrictions Weight Bearing Restrictions Per Provider Order: No     Mobility Bed Mobility Overal bed mobility: Independent             General bed mobility comments: supine to seated EOB independently    Transfers Overall transfer level: Needs assistance Equipment used: None Transfers: Sit  to/from Stand Sit to Stand: Min assist (HHA)           General transfer comment: Pt required min HHA to go from seated to standing      Balance Overall balance assessment: Modified Independent (SBA)                                         ADL either performed or assessed with clinical judgement   ADL Overall ADL's : Needs assistance/impaired Eating/Feeding: Set up   Grooming: Set up   Upper Body Bathing: Sitting   Lower Body Bathing: Sitting/lateral leans   Upper Body Dressing : Set up;Sitting   Lower Body Dressing: Set up;Sit to/from stand   Toilet Transfer: Minimal assistance;BSC/3in1   Toileting- Clothing Manipulation and Hygiene: Set up   Tub/ Shower Transfer: Minimal assistance;Shower seat   Functional mobility during ADLs: Contact guard assist       Vision Baseline Vision/History: 1 Wears glasses Ability to See in Adequate Light: 0 Adequate Patient Visual Report: No change from baseline Vision Assessment?: No apparent visual deficits     Perception Perception: Not tested       Praxis Praxis: Not tested       Pertinent Vitals/Pain Pain Assessment Pain Assessment: No/denies pain     Extremity/Trunk Assessment Upper Extremity Assessment Upper Extremity Assessment: Overall WFL for tasks assessed   Lower Extremity Assessment Lower Extremity Assessment: Defer to PT evaluation   Cervical /  Trunk Assessment Cervical / Trunk Assessment: Normal   Communication Communication Communication: No apparent difficulties Factors Affecting Communication:  (hearing aids)   Cognition Arousal: Alert Behavior During Therapy: WFL for tasks assessed/performed Cognition: No apparent impairments                               Following commands: Intact                  Home Living Family/patient expects to be discharged to:: Private residence Living Arrangements: Alone Available Help at Discharge: Family Type of Home:  House Home Access: Ramped entrance     Home Layout: Two level;Able to live on main level with bedroom/bathroom     Bathroom Shower/Tub: Producer, television/film/video: Handicapped height Bathroom Accessibility: Yes How Accessible: Accessible via walker Home Equipment: Rolling Walker (2 wheels);Cane - quad;BSC/3in1;Shower seat;Grab bars - toilet;Grab bars - tub/shower;Wheelchair - manual          Prior Functioning/Environment Prior Level of Function : Independent/Modified Independent             Mobility Comments: community ambulator, did not use RW or cane but has if needed. Grocery shopping independently ADLs Comments: Independent with bathing, dressing, cooking, cleaning, grocery shopping    OT Problem List: Decreased activity tolerance;Decreased strength   OT Treatment/Interventions:        OT Goals(Current goals can be found in the care plan section)   Acute Rehab OT Goals Patient Stated Goal: to return home OT Goal Formulation: With patient   AM-PAC OT "6 Clicks" Daily Activity     Outcome Measure Help from another person eating meals?: None Help from another person taking care of personal grooming?: None Help from another person toileting, which includes using toliet, bedpan, or urinal?: None Help from another person bathing (including washing, rinsing, drying)?: A Little Help from another person to put on and taking off regular upper body clothing?: None Help from another person to put on and taking off regular lower body clothing?: A Little 6 Click Score: 22   End of Session Nurse Communication: Mobility status  Activity Tolerance: Patient tolerated treatment well Patient left: in chair;with call bell/phone within reach;with nursing/sitter in room  OT Visit Diagnosis: Muscle weakness (generalized) (M62.81)                Time: 4098-1191 OT Time Calculation (min): 43 min Charges:  OT General Charges $OT Visit: 1 Visit OT Evaluation $OT Eval Low  Complexity: 1 Low OT Treatments $Self Care/Home Management : 23-37 mins  Bevelyn Ngo, OTR/L 09/17/2023, 9:50 AM

## 2023-09-17 NOTE — NC FL2 (Signed)
[place] MEDICAID FL2 LEVEL OF CARE FORM     IDENTIFICATION  Patient Name: Mary Moss Birthdate: [birth year] Sex: female Admission Date (Current Location): 09/15/2023  [facility] and [place] Number:  [employer] and Address:  [facility],  [address] [place] [address]      Provider Number: [id number]  Attending Physician Name and Address:  Catarina Hartshorn, MD  Relative Name and Phone Number:       Current Level of Care: Hospital Recommended Level of Care: Skilled Nursing Facility Prior Approval Number:    Date Approved/Denied:   PASRR Number: [id number]  Discharge Plan: SNF    Current Diagnoses: Patient Active Problem List   Diagnosis Date Noted   Paroxysmal atrial fibrillation with RVR (HCC) 09/16/2023   Influenza A 09/15/2023   SIRS (systemic inflammatory response syndrome) (HCC) 09/15/2023   Acute respiratory failure with hypoxia (HCC) 09/15/2023   Atelectasis 09/15/2023   GERD (gastroesophageal reflux disease) 09/15/2023   Gastric ulcer 02/23/2022   Symptomatic anemia 01/21/2022   S/P total left hip arthroplasty 01/20/2022   Seropositive rheumatoid arthritis (HCC) 01/20/2022   Osteoarthritis of left hip 12/31/2021   Degeneration of lumbar intervertebral disc 08/09/2021   Status post reverse arthroplasty of right shoulder 11/23/2019   Morbid obesity (HCC) 11/18/2018   OA (osteoarthritis) of knee 06/08/2016   Arthritis of knee, right 01/31/2016   Hyperlipidemia 09/02/2015   Pre-diabetes 07/12/2015   Essential hypertension 07/03/2015   Post-traumatic arthritis of ankle 12/20/2014   Acute medial meniscal tear 02/06/2014   Low bone mass 12/14/2012   GAD (generalized anxiety disorder) 12/14/2012   Vitamin D deficiency 12/14/2012    Orientation RESPIRATION BLADDER Height & Weight     Self, Time, Situation, Place  Normal Continent Weight: 211 lb 13.8 oz (96.1 kg) Height:  5\' 8"  (172.7 cm)  BEHAVIORAL SYMPTOMS/MOOD NEUROLOGICAL  BOWEL NUTRITION STATUS      Continent Diet (Heart healthy)  AMBULATORY STATUS COMMUNICATION OF NEEDS Skin   Extensive Assist Verbally Normal                       Personal Care Assistance Level of Assistance  Bathing, Feeding, Dressing Bathing Assistance: Limited assistance Feeding assistance: Independent Dressing Assistance: Limited assistance     Functional Limitations Info  Sight, Hearing, Speech Sight Info: Impaired Hearing Info: Impaired Speech Info: Adequate    SPECIAL CARE FACTORS FREQUENCY  PT (By licensed PT), OT (By licensed OT)     PT Frequency: 5 times weekly OT Frequency: 5 times weekly            Contractures Contractures Info: Not present    Additional Factors Info  Code Status, Allergies Code Status Info: FULL Allergies Info: Statins, Bacitracin-polymyxin B, Neosporin (Neomycin-bacitracin Zn-polymyx)           Current Medications (09/17/2023):  This is the current hospital active medication list Current Facility-Administered Medications  Medication Dose Route Frequency Provider Last Rate Last Admin   acetaminophen (TYLENOL) tablet 650 mg  650 mg Oral Q6H PRN Adefeso, Oladapo, DO   650 mg at 09/16/23 1613   Or   acetaminophen (TYLENOL) suppository 650 mg  650 mg Rectal Q6H PRN Adefeso, Oladapo, DO       apixaban (ELIQUIS) tablet 5 mg  5 mg Oral BID Antoine Poche, MD   5 mg at 09/17/23 0910   budesonide (PULMICORT) nebulizer solution 0.5 mg  0.5 mg Nebulization BID Catarina Hartshorn, MD   0.5  mg at 09/17/23 0758   Chlorhexidine Gluconate Cloth 2 % PADS 6 each  6 each Topical Q0600 Tat, Onalee Hua, MD   6 each at 09/17/23 0547   dextromethorphan-guaiFENesin (MUCINEX DM) 30-600 MG per 12 hr tablet 1 tablet  1 tablet Oral BID Adefeso, Oladapo, DO   1 tablet at 09/17/23 0910   guaiFENesin-dextromethorphan (ROBITUSSIN DM) 100-10 MG/5ML syrup 5 mL  5 mL Oral Q4H PRN Adefeso, Oladapo, DO       levalbuterol (XOPENEX) nebulizer solution 0.63 mg  0.63 mg  Nebulization TID Tat, Onalee Hua, MD   0.63 mg at 09/17/23 0755   levalbuterol (XOPENEX) nebulizer solution 0.63 mg  0.63 mg Nebulization Q6H PRN Tat, Onalee Hua, MD       metoprolol tartrate (LOPRESSOR) tablet 37.5 mg  37.5 mg Oral BID Antoine Poche, MD   37.5 mg at 09/17/23 0910   ondansetron (ZOFRAN) tablet 4 mg  4 mg Oral Q6H PRN Adefeso, Oladapo, DO       Or   ondansetron (ZOFRAN) injection 4 mg  4 mg Intravenous Q6H PRN Adefeso, Oladapo, DO       pantoprazole (PROTONIX) EC tablet 40 mg  40 mg Oral Daily Adefeso, Oladapo, DO   40 mg at 09/17/23 1610     Discharge Medications: Please see discharge summary for a list of discharge medications.  Relevant Imaging Results:  Relevant Lab Results:   Additional Information SSN: [id number], [place]

## 2023-09-17 NOTE — Plan of Care (Signed)
  Problem: Acute Rehab PT Goals(only PT should resolve) Goal: Pt Will Go Supine/Side To Sit Outcome: Progressing Flowsheets (Taken 09/17/2023 1344) Pt will go Supine/Side to Sit:  Independently  with modified independence Goal: Patient Will Transfer Sit To/From Stand Outcome: Progressing Flowsheets (Taken 09/17/2023 1344) Patient will transfer sit to/from stand:  with modified independence  with supervision Goal: Pt Will Transfer Bed To Chair/Chair To Bed Outcome: Progressing Flowsheets (Taken 09/17/2023 1344) Pt will Transfer Bed to Chair/Chair to Bed:  with modified independence  with supervision Goal: Pt Will Ambulate Outcome: Progressing Flowsheets (Taken 09/17/2023 1344) Pt will Ambulate:  75 feet  with supervision  with contact guard assist  with rolling walker   1:44 PM, 09/17/23 Ocie Bob, MPT Physical Therapist with [facility] [phone] office [phone] mobile phone

## 2023-09-17 NOTE — Progress Notes (Signed)
PROGRESS NOTE  Mary Moss BJY:[id number] DOB: 1936/05/23 DOA: 09/15/2023 PCP: Adrian Prince, MD  Brief History:  88 year old female with history of hypertension, GERD/PUD, CKD stage III, seropositive rheumatoid arthritis, hyperlipidemia, impaired glucose tolerance, and vitamin D deficiency presenting with 5-day history of generalized weakness, nonproductive cough, myalgias, arthralgias, and fevers and chills.  The patient progressively felt worse over the last 1 to 2 days.  She had some shortness of breath.  She complained of some loose stools without any hematochezia or melena.  She denies any chest pain or abdominal pain.  She denies any hemoptysis.  She denies any headache or neck pain.  She has some nausea without any emesis.  Her appetite has been poor. Patient endorses previous history of tobacco.  She quit 16 years ago after approximately 15-20-pack-year history. In the ED, the patient had a temperature up to 100.7 F.  She was hemodynamically stable with oxygen saturation 85% on room air.  She was placed on 2 L with saturation 92-94%. WBC 6.8, hemoglobin 14.8, platelets 168.  Sodium 135, potassium 3.8, bicarbonate 24, serum creatinine 0.94.  LFTs were unremarkable.  The patient was given a 500 cc normal saline bolus in the ED.  The patient was offered oseltamavir, but she deferred expressing concern regarding side effects.  While in the emergency department, the patient developed atrial fibrillation with RVR.  She was started on diltiazem drip.  She converted back to sinus later in the day.  She was transitioned to po metoprolol.  Cardiology was consulted to assist with management.    Assessment/Plan: Acute respiratory failure with hypoxia -Secondary to influenza -Initially placed on 4 L nasal cannula -Wean oxygen for saturation greater 90%   Influenza pneumonitis -Patient deferred taking oseltamavir after discussion of the risks, benefits, and alternatives -Continue  supportive care -xopenex -pulmicort -add brovana   New onset atrial fibrillation with RVR, type unspecified -Started diltiazem drip>>spontaneous conversion to sinus -transitioned to po metoprolol -appreciate cardiology -09/17/23 Echo-EF 50-55%, no WMA, normal RVF -TSH--1.064 -Optimize electrolytes -CHADSVASc = 4 (age x 2, female, HTN) -pt expresses concern regarding anticoagulation initially but ultimately agreed (01/21/22 EGD--nonbleeding gastric ulcers--attributed to NSAID induced) and pt was on xarelto for DVT prophylaxis at the time -appreciate cardiology -IV heparin>>apixaban   GERD -hx of PUD -continue pantoprazole   Seropositive RA -receives golinumab every 2 months--last dose 08/31/23 -unclear if patient is still on leflunomide and hydroxychloroquine--daughter to bring list of meds   Essential HTN -holding lisinopril and hydrochlorothiazide to allow BP margin for rate control of Afib -now on metoprolol tartrate   Gout -restart allopurinol once med rec is verified   Thrombocytopenia -due to viral infection -monitor CBC                 Family Communication: daughter updated 09/16/23   Consultants:  cardiology   Code Status:  FULL    DVT Prophylaxis:  apixaban     Procedures: As Listed in Progress Note Above   Antibiotics: None             Subjective: Pt is breathing better.  Denies cp, sob, n/v/d, abd pain  Objective: Vitals:   09/17/23 1415 09/17/23 1500 09/17/23 1551 09/17/23 1600  BP: (!) 119/48 (!) 116/36  (!) 126/38  Pulse: 82 82  86  Resp: 17 19  (!) 22  Temp:   98 F (36.7 C)   TempSrc:   Oral   SpO2: 92%  92%  Weight:      Height:        Intake/Output Summary (Last 24 hours) at 09/17/2023 1728 Last data filed at 09/17/2023 6578 Gross per 24 hour  Intake 346.8 ml  Output --  Net 346.8 ml   Weight change: 2.4 kg Exam:  General:  Pt is alert, follows commands appropriately, not in acute distress HEENT: No icterus, No  thrush, No neck mass, [place]/AT Cardiovascular: RRR, S1/S2, no rubs, no gallops Respiratory: bibasilar rales.  Mild basilar wheeze Abdomen: Soft/+BS, non tender, non distended, no guarding Extremities: No edema, No lymphangitis, No petechiae, No rashes, no synovitis   Data Reviewed: I have personally reviewed following labs and imaging studies Basic Metabolic Panel: Recent Labs  Lab 09/15/23 1740 09/16/23 0439 09/17/23 0535  NA 135 136 134*  K 3.8 3.5 4.1  CL 97* 99 101  CO2 24 26 25   GLUCOSE 108* 93 95  BUN 19 21 19   CREATININE 0.94 0.97 0.88  CALCIUM 8.8* 8.2* 8.3*  MG  --  1.9 2.1  PHOS  --  3.4  --    Liver Function Tests: Recent Labs  Lab 09/15/23 1740 09/16/23 0439  AST 34 30  ALT 24 20  ALKPHOS 76 63  BILITOT 1.1 1.1  PROT 7.1 6.2*  ALBUMIN 3.5 3.0*   No results for input(s): "LIPASE", "AMYLASE" in the last 168 hours. No results for input(s): "AMMONIA" in the last 168 hours. Coagulation Profile: No results for input(s): "INR", "PROTIME" in the last 168 hours. CBC: Recent Labs  Lab 09/15/23 1740 09/16/23 0439 09/17/23 0535  WBC 6.8 6.7 3.6*  NEUTROABS 5.5  --   --   HGB 14.8 14.2 13.4  HCT 45.9 45.4 40.9  MCV 94.4 96.6 94.7  PLT 164 143* 149*   Cardiac Enzymes: No results for input(s): "CKTOTAL", "CKMB", "CKMBINDEX", "TROPONINI" in the last 168 hours. BNP: Invalid input(s): "POCBNP" CBG: No results for input(s): "GLUCAP" in the last 168 hours. HbA1C: No results for input(s): "HGBA1C" in the last 72 hours. Urine analysis:    Component Value Date/Time   COLORURINE YELLOW 11/16/2019 1503   APPEARANCEUR HAZY (A) 11/16/2019 1503   LABSPEC 1.020 11/16/2019 1503   PHURINE 5.0 11/16/2019 1503   GLUCOSEU NEGATIVE 11/16/2019 1503   HGBUR NEGATIVE 11/16/2019 1503   BILIRUBINUR NEGATIVE 11/16/2019 1503   BILIRUBINUR neg 07/03/2015 0846   KETONESUR NEGATIVE 11/16/2019 1503   PROTEINUR NEGATIVE 11/16/2019 1503   UROBILINOGEN negative 07/03/2015 0846    NITRITE NEGATIVE 11/16/2019 1503   LEUKOCYTESUR TRACE (A) 11/16/2019 1503   Sepsis Labs: @LABRCNTIP (procalcitonin:4,lacticidven:4) ) Recent Results (from the past 240 hours)  Resp panel by RT-PCR (RSV, Flu A&B, Covid) Anterior Nasal Swab     Status: Abnormal   Collection Time: 09/15/23  4:55 PM   Specimen: Anterior Nasal Swab  Result Value Ref Range Status   SARS Coronavirus 2 by RT PCR NEGATIVE NEGATIVE Final    Comment: (NOTE) SARS-CoV-2 target nucleic acids are NOT DETECTED.  The SARS-CoV-2 RNA is generally detectable in upper respiratory specimens during the acute phase of infection. The lowest concentration of SARS-CoV-2 viral copies this assay can detect is 138 copies/mL. A negative result does not preclude SARS-Cov-2 infection and should not be used as the sole basis for treatment or other patient management decisions. A negative result may occur with  improper specimen collection/handling, submission of specimen other than nasopharyngeal swab, presence of viral mutation(s) within the areas targeted by this assay, and inadequate number  of viral copies(<138 copies/mL). A negative result must be combined with clinical observations, patient history, and epidemiological information. The expected result is Negative.  Fact Sheet for Patients:  BloggerCourse.com  Fact Sheet for Healthcare Providers:  SeriousBroker.it  This test is no t yet approved or cleared by the [place] FDA and  has been authorized for detection and/or diagnosis of SARS-CoV-2 by FDA under an Emergency Use Authorization (EUA). This EUA will remain  in effect (meaning this test can be used) for the duration of the COVID-19 declaration under Section 564(b)(1) of the Act, 21 U.S.C.section 360bbb-3(b)(1), unless the authorization is terminated  or revoked sooner.       Influenza A by PCR POSITIVE (A) NEGATIVE Final   Influenza B by PCR NEGATIVE  NEGATIVE Final    Comment: (NOTE) The Xpert Xpress SARS-CoV-2/FLU/RSV plus assay is intended as an aid in the diagnosis of influenza from Nasopharyngeal swab specimens and should not be used as a sole basis for treatment. Nasal washings and aspirates are unacceptable for Xpert Xpress SARS-CoV-2/FLU/RSV testing.  Fact Sheet for Patients: BloggerCourse.com  Fact Sheet for Healthcare Providers: SeriousBroker.it  This test is not yet approved or cleared by the [place] FDA and has been authorized for detection and/or diagnosis of SARS-CoV-2 by FDA under an Emergency Use Authorization (EUA). This EUA will remain in effect (meaning this test can be used) for the duration of the COVID-19 declaration under Section 564(b)(1) of the Act, 21 U.S.C. section 360bbb-3(b)(1), unless the authorization is terminated or revoked.     Resp Syncytial Virus by PCR NEGATIVE NEGATIVE Final    Comment: (NOTE) Fact Sheet for Patients: BloggerCourse.com  Fact Sheet for Healthcare Providers: SeriousBroker.it  This test is not yet approved or cleared by the [place] FDA and has been authorized for detection and/or diagnosis of SARS-CoV-2 by FDA under an Emergency Use Authorization (EUA). This EUA will remain in effect (meaning this test can be used) for the duration of the COVID-19 declaration under Section 564(b)(1) of the Act, 21 U.S.C. section 360bbb-3(b)(1), unless the authorization is terminated or revoked.  Performed at [facility], [address], [place], [place] [address]   MRSA Next Gen by PCR, Nasal     Status: None   Collection Time: 09/16/23 10:54 AM   Specimen: Nasal Mucosa; Nasal Swab  Result Value Ref Range Status   MRSA by PCR Next Gen NOT DETECTED NOT DETECTED Final    Comment: (NOTE) The GeneXpert MRSA Assay (FDA approved for NASAL specimens only), is one component of a  comprehensive MRSA colonization surveillance program. It is not intended to diagnose MRSA infection nor to guide or monitor treatment for MRSA infections. Test performance is not FDA approved in patients less than 26 years old. Performed at [facility], [address] [place], [place] [address]   Culture, blood (Routine X 2) w Reflex to ID Panel     Status: None (Preliminary result)   Collection Time: 09/16/23  6:00 PM   Specimen: BLOOD  Result Value Ref Range Status   Specimen Description BLOOD BLOOD LEFT HAND  Final   Special Requests   Final    BOTTLES DRAWN AEROBIC ONLY Blood Culture results may not be optimal due to an inadequate volume of blood received in culture bottles   Culture   Final    NO GROWTH < 12 HOURS Performed at [facility], [address] [place], [place] [address]    Report Status PENDING  Incomplete  Culture, blood (  Routine X 2) w Reflex to ID Panel     Status: None (Preliminary result)   Collection Time: 09/16/23  6:06 PM   Specimen: BLOOD LEFT FOREARM  Result Value Ref Range Status   Specimen Description   Final    BLOOD LEFT FOREARM BOTTLES DRAWN AEROBIC AND ANAEROBIC   Special Requests   Final    Blood Culture results may not be optimal due to an inadequate volume of blood received in culture bottles   Culture   Final    NO GROWTH < 12 HOURS Performed at [facility], [address], [place], [place] [address]    Report Status PENDING  Incomplete     Scheduled Meds:  apixaban  5 mg Oral BID   budesonide (PULMICORT) nebulizer solution  0.5 mg Nebulization BID   Chlorhexidine Gluconate Cloth  6 each Topical Q0600   dextromethorphan-guaiFENesin  1 tablet Oral BID   levalbuterol  0.63 mg Nebulization TID   metoprolol tartrate  37.5 mg Oral BID   pantoprazole  40 mg Oral Daily   Continuous Infusions:  Procedures/Studies: ECHOCARDIOGRAM COMPLETE Result Date: 09/17/2023    ECHOCARDIOGRAM REPORT   Patient Name:   Mary Moss Date of Exam:  09/17/2023 Medical Rec #:  [id number]         Height:       68.0 in Accession #:    [id number]        Weight:       211.9 lb Date of Birth:  1935-08-16        BSA:          2.094 m Patient Age:    87 years          BP:           114/68 mmHg Patient Gender: F                 HR:           78 bpm. [facility] Procedure: 2D Echo, Cardiac Doppler and Color Doppler (Both Spectral and Color            Flow Doppler were utilized during procedure). Indications:    I48.91* Unspeicified atrial fibrillation  History:        Patient has no prior history of Echocardiogram examinations.                 Signs/Symptoms:Current on this admission-FLU A POSITIVE; Risk                 Factors:Hypertension and Dyslipidemia.  Sonographer:    Dominica Severin RCS, RVS Referring Phys: [phone] Gillie Crisci  Sonographer Comments: Global longitudinal strain was attempted. IMPRESSIONS  1. Left ventricular ejection fraction, by estimation, is 55 to 60%. The left ventricle has normal function. The left ventricle has no regional wall motion abnormalities. There is mild left ventricular hypertrophy. Left ventricular diastolic parameters were normal. The global longitudinal strain is indeterminate.  2. Right ventricular systolic function is normal. The right ventricular size is normal. Tricuspid regurgitation signal is inadequate for assessing PA pressure.  3. The mitral valve is normal in structure. No evidence of mitral valve regurgitation. No evidence of mitral stenosis.  4. The tricuspid valve is abnormal.  5. The aortic valve was not well visualized. There is mild calcification of the aortic valve. There is mild thickening of the aortic valve. Aortic valve regurgitation is not visualized. No aortic stenosis is present.  6. The inferior  vena cava is normal in size with greater than 50% respiratory variability, suggesting right atrial pressure of 3 mmHg. Comparison(s): No prior Echocardiogram. FINDINGS  Left Ventricle: Left ventricular  ejection fraction, by estimation, is 55 to 60%. The left ventricle has normal function. The left ventricle has no regional wall motion abnormalities. Strain was performed and the global longitudinal strain is indeterminate. The left ventricular internal cavity size was normal in size. There is mild left ventricular hypertrophy. Left ventricular diastolic parameters were normal. Right Ventricle: The right ventricular size is normal. Right vetricular wall thickness was not well visualized. Right ventricular systolic function is normal. Tricuspid regurgitation signal is inadequate for assessing PA pressure. Left Atrium: Left atrial size was normal in size. Right Atrium: Right atrial size was normal in size. Pericardium: There is no evidence of pericardial effusion. Mitral Valve: The mitral valve is normal in structure. There is mild thickening of the mitral valve leaflet(s). There is mild calcification of the mitral valve leaflet(s). Mild mitral annular calcification. No evidence of mitral valve regurgitation. No evidence of mitral valve stenosis. MV peak gradient, 4.8 mmHg. The mean mitral valve gradient is 2.0 mmHg. Tricuspid Valve: The tricuspid valve is abnormal. Tricuspid valve regurgitation is mild . No evidence of tricuspid stenosis. Aortic Valve: The aortic valve was not well visualized. There is mild calcification of the aortic valve. There is mild thickening of the aortic valve. There is mild aortic valve annular calcification. Aortic valve regurgitation is not visualized. No aortic stenosis is present. Aortic valve mean gradient measures 6.0 mmHg. Aortic valve peak gradient measures 11.0 mmHg. Aortic valve area, by VTI measures 2.21 cm. Pulmonic Valve: The pulmonic valve was not well visualized. Pulmonic valve regurgitation is not visualized. No evidence of pulmonic stenosis. Aorta: The aortic root and ascending aorta are structurally normal, with no evidence of dilitation. Venous: The inferior vena cava is  normal in size with greater than 50% respiratory variability, suggesting right atrial pressure of 3 mmHg. IAS/Shunts: No atrial level shunt detected by color flow Doppler. Additional Comments: 3D imaging was not performed.  LEFT VENTRICLE PLAX 2D LVIDd:         4.30 cm     Diastology LVIDs:         3.00 cm     LV e' medial:    8.16 cm/s LV PW:         1.10 cm     LV E/e' medial:  11.6 LV IVS:        1.10 cm     LV e' lateral:   10.20 cm/s LVOT diam:     2.00 cm     LV E/e' lateral: 9.2 LV SV:         70 LV SV Index:   33 LVOT Area:     3.14 cm  LV Volumes (MOD) LV vol d, MOD A2C: 63.7 ml LV vol d, MOD A4C: 50.8 ml LV vol s, MOD A2C: 24.1 ml LV vol s, MOD A4C: 21.8 ml LV SV MOD A2C:     39.6 ml LV SV MOD A4C:     50.8 ml LV SV MOD BP:      34.6 ml RIGHT VENTRICLE RV Basal diam:  3.40 cm RV Mid diam:    3.40 cm RV S prime:     12.30 cm/s TAPSE (M-mode): 3.2 cm LEFT ATRIUM             Index        RIGHT ATRIUM  Index LA diam:        3.20 cm 1.53 cm/m   RA Area:     13.20 cm LA Vol (A2C):   31.8 ml 15.18 ml/m  RA Volume:   24.40 ml  11.65 ml/m LA Vol (A4C):   36.1 ml 17.24 ml/m LA Biplane Vol: 36.4 ml 17.38 ml/m  AORTIC VALVE                     PULMONIC VALVE AV Area (Vmax):    2.12 cm      PV Vmax:       0.77 m/s AV Area (Vmean):   2.01 cm      PV Peak grad:  2.4 mmHg AV Area (VTI):     2.21 cm AV Vmax:           166.00 cm/s AV Vmean:          120.000 cm/s AV VTI:            0.317 m AV Peak Grad:      11.0 mmHg AV Mean Grad:      6.0 mmHg LVOT Vmax:         112.00 cm/s LVOT Vmean:        76.900 cm/s LVOT VTI:          0.223 m LVOT/AV VTI ratio: 0.70  AORTA Ao Root diam: 2.90 cm Ao Asc diam:  2.90 cm MITRAL VALVE MV Area (PHT): 4.80 cm     SHUNTS MV Area VTI:   2.31 cm     Systemic VTI:  0.22 m MV Peak grad:  4.8 mmHg     Systemic Diam: 2.00 cm MV Mean grad:  2.0 mmHg MV Vmax:       1.09 m/s MV Vmean:      69.6 cm/s MV Decel Time: 158 msec MV E velocity: 94.30 cm/s MV A velocity: 108.00 cm/s MV E/A  ratio:  0.87 Dina Rich MD Electronically signed by Dina Rich MD Signature Date/Time: 09/17/2023/12:21:51 PM    Final    DG Chest 2 View Result Date: 09/15/2023 CLINICAL DATA:  Shortness of breath patient reports flu like symptoms. Hypoxia. EXAM: CHEST - 2 VIEW COMPARISON:  10/08/2021 FINDINGS: Chronic hyperinflation. Mild bronchial thickening. Ill-defined opacity in the lung bases suggestive of atelectasis and possible trace effusions. Stable heart size and mediastinal contours. Aortic atherosclerosis. No pulmonary edema. No pneumothorax. Reverse right shoulder arthroplasty. IMPRESSION: 1. Chronic hyperinflation with mild bronchial thickening. 2. Ill-defined opacity in the lung bases suggestive of atelectasis and possible trace effusions. Electronically Signed   By: Narda Rutherford M.D.   On: 09/15/2023 17:25    Catarina Hartshorn, DO  [facility]  If 7PM-7AM, please contact night-coverage [email or web address] Password [facility] 09/17/2023, 5:28 PM   LOS: 2 days

## 2023-09-17 NOTE — Consult Note (Addendum)
PHARMACY - ANTICOAGULATION CONSULT NOTE  Pharmacy Consult for IV Heparin Indication: atrial fibrillation  Patient Measurements: Height: 5\' 8"  (172.7 cm) Weight: 96.1 kg (211 lb 13.8 oz) IBW/kg (Calculated) : 63.9 Heparin Dosing Weight: 84.7 kg  Labs: Recent Labs    09/15/23 1740 09/16/23 0439 09/16/23 2031 09/17/23 0535  HGB 14.8 14.2  --  13.4  HCT 45.9 45.4  --  40.9  PLT 164 143*  --  149*  HEPARINUNFRC  --   --  0.95* 0.80*  CREATININE 0.94 0.97  --  0.88    Estimated Creatinine Clearance: 56.7 mL/min (by C-G formula based on SCr of 0.88 mg/dL).  Medications:  No anticoagulation prior to admission per my chart review  Assessment: 88 y/o F with medical history including hypertension, GERD admitted with influenza A infection. Admission complicated by new onset Aflutter / fib. Cardiology is following and patient is currently on a heparin drip with plans to convert to apixaban 09/17/23.   HL 0.95> 0.8, supratherapeutic; 1100 units/hr Hgb 14.2> 13.4, no bleeding noted  Goal of Therapy:  Heparin level 0.3-0.7 units/ml Monitor platelets by anticoagulation protocol: Yes   Plan:  --Heparin level is supratherapeutic --Decrease heparin infusion to 950 units/hr --Re-check HL 8 hours from rate change --Daily CBC per protocol while on IV heparin --F/u transition to apixaban 09/17/23  Elder Cyphers, BS Pharm D, BCPS Clinical Pharmacist 09/17/2023,7:35 AM

## 2023-09-18 DIAGNOSIS — J101 Influenza due to other identified influenza virus with other respiratory manifestations: Secondary | ICD-10-CM | POA: Diagnosis not present

## 2023-09-18 DIAGNOSIS — J9601 Acute respiratory failure with hypoxia: Secondary | ICD-10-CM | POA: Diagnosis not present

## 2023-09-18 DIAGNOSIS — I480 Paroxysmal atrial fibrillation: Secondary | ICD-10-CM | POA: Diagnosis not present

## 2023-09-18 LAB — CBC
HCT: 39.8 % (ref 36.0–46.0)
Hemoglobin: 13.2 g/dL (ref 12.0–15.0)
MCH: 31.2 pg (ref 26.0–34.0)
MCHC: 33.2 g/dL (ref 30.0–36.0)
MCV: 94.1 fL (ref 80.0–100.0)
Platelets: 143 10*3/uL — ABNORMAL LOW (ref 150–400)
RBC: 4.23 MIL/uL (ref 3.87–5.11)
RDW: 12.5 % (ref 11.5–15.5)
WBC: 3.7 10*3/uL — ABNORMAL LOW (ref 4.0–10.5)
nRBC: 0 % (ref 0.0–0.2)

## 2023-09-18 MED ORDER — ARFORMOTEROL TARTRATE 15 MCG/2ML IN NEBU
15.0000 ug | INHALATION_SOLUTION | Freq: Two times a day (BID) | RESPIRATORY_TRACT | Status: DC
  Administered 2023-09-18 – 2023-09-22 (×8): 15 ug via RESPIRATORY_TRACT
  Filled 2023-09-18 (×8): qty 2

## 2023-09-18 NOTE — TOC Progression Note (Signed)
 Transition of Care [facility]) - Progression Note    Patient Details  Name: Mary Moss MRN: [id number] Date of Birth: 11-Oct-1935  Transition [facility] CM/SW Contact  Catalina Gravel, LCSW Phone Number: 09/18/2023, 5:09 PM  Clinical Narrative:    Pt  and daughter have expressed to RN staff that they now are interested SNF as recommended by PT.  This will allow pt supervised PT.  FL2 completed, and [facility] has a bed offer.  CSW attempted to speak with daughter not present, message left. CSW initiated HTA Auth, provided CSW number as well as Supervisor call back number for 2/23.  TOC to follow.     Expected Discharge Plan: Skilled Nursing Facility Barriers to Discharge: Continued Medical Work up  Expected Discharge Plan and Services In-house Referral: Clinical Social Work Discharge Planning Services: CM Consult Post Acute Care Choice:  (still deciding) Living arrangements for the past 2 months: Single Family Home                                       Social Determinants of Health (SDOH) Interventions SDOH Screenings   Food Insecurity: No Food Insecurity (09/15/2023)  Housing: Low Risk  (09/15/2023)  Transportation Needs: No Transportation Needs (09/16/2023)  Utilities: Not At Risk (09/15/2023)  Depression (PHQ2-9): Medium Risk (01/30/2022)  Financial Resource Strain: Low Risk  (09/13/2019)  Physical Activity: Inactive (09/13/2019)  Social Connections: Moderately Integrated (09/15/2023)  Stress: No Stress Concern Present (09/13/2019)  Tobacco Use: Medium Risk (09/15/2023)    Readmission Risk Interventions     No data to display

## 2023-09-18 NOTE — Plan of Care (Signed)

## 2023-09-18 NOTE — Plan of Care (Signed)
  Problem: Education: Goal: Knowledge of General Education information will improve Description: Including pain rating scale, medication(s)/side effects and non-pharmacologic comfort measures Outcome: Adequate for Discharge   Problem: Health Behavior/Discharge Planning: Goal: Ability to manage health-related needs will improve Outcome: Adequate for Discharge   Problem: Clinical Measurements: Goal: Ability to maintain clinical measurements within normal limits will improve Outcome: Progressing Goal: Will remain free from infection Outcome: Progressing Goal: Diagnostic test results will improve Outcome: Progressing Goal: Respiratory complications will improve Outcome: Progressing Goal: Cardiovascular complication will be avoided Outcome: Progressing   Problem: Activity: Goal: Risk for activity intolerance will decrease Outcome: Adequate for Discharge   Problem: Nutrition: Goal: Adequate nutrition will be maintained Outcome: Adequate for Discharge   Problem: Coping: Goal: Level of anxiety will decrease Outcome: Adequate for Discharge   Problem: Elimination: Goal: Will not experience complications related to bowel motility Outcome: Adequate for Discharge Goal: Will not experience complications related to urinary retention Outcome: Adequate for Discharge   Problem: Pain Managment: Goal: General experience of comfort will improve and/or be controlled Outcome: Adequate for Discharge   Problem: Safety: Goal: Ability to remain free from injury will improve Outcome: Adequate for Discharge   Problem: Skin Integrity: Goal: Risk for impaired skin integrity will decrease Outcome: Adequate for Discharge

## 2023-09-18 NOTE — Progress Notes (Signed)
 PROGRESS NOTE  Mary Moss:[id number] DOB: [birth year] DOA: 09/15/2023 PCP: Adrian Prince, MD  Brief History:  88 year old female with history of hypertension, GERD/PUD, CKD stage III, seropositive rheumatoid arthritis, hyperlipidemia, impaired glucose tolerance, and vitamin D deficiency presenting with 5-day history of generalized weakness, nonproductive cough, myalgias, arthralgias, and fevers and chills.  The patient progressively felt worse over the last 1 to 2 days.  She had some shortness of breath.  She complained of some loose stools without any hematochezia or melena.  She denies any chest pain or abdominal pain.  She denies any hemoptysis.  She denies any headache or neck pain.  She has some nausea without any emesis.  Her appetite has been poor. Patient endorses previous history of tobacco.  She quit 16 years ago after approximately 15-20-pack-year history. In the ED, the patient had a temperature up to 100.7 F.  She was hemodynamically stable with oxygen saturation 85% on room air.  She was placed on 2 L with saturation 92-94%. WBC 6.8, hemoglobin 14.8, platelets 168.  Sodium 135, potassium 3.8, bicarbonate 24, serum creatinine 0.94.  LFTs were unremarkable.  The patient was given a 500 cc normal saline bolus in the ED.  The patient was offered oseltamavir, but she deferred expressing concern regarding side effects.  While in the emergency department, the patient developed atrial fibrillation with RVR.  She was started on diltiazem drip.  She converted back to sinus later in the day.  She was transitioned to po metoprolol.  Cardiology was consulted to assist with management.    Assessment/Plan:  Acute respiratory failure with hypoxia -Secondary to influenza -Initially placed on 4 L [place]>>2L -Wean oxygen for saturation greater 90%   Influenza pneumonitis -Patient deferred taking oseltamavir after discussion of the risks, benefits, and alternatives -Continue  supportive care -continue xopenex -continue pulmicort -add brovana   New onset atrial fibrillation with RVR, type unspecified -Started diltiazem drip>>spontaneous conversion to sinus -transitioned to po metoprolol -appreciate cardiology -09/17/23 Echo-EF 55-60%%, no WMA, normal RVF -TSH--1.064 -Optimize electrolytes -CHADSVASc = 4 (age x 2, female, HTN) -initially expresses concern regarding anticoagulation initially but ultimately agreed (01/21/22 EGD--nonbleeding gastric ulcers--attributed to NSAID induced) and pt was on xarelto for DVT prophylaxis at the time -appreciate cardiology -IV heparin>>apixaban -Hgb remains stable on apixaban   GERD -hx of PUD -continue pantoprazole   Seropositive RA -receives golinumab every 2 months--last dose 08/31/23 -unclear if patient is still on leflunomide and -hydroxychloroquine--daughter to bring list of meds   Essential HTN -holding lisinopril and hydrochlorothiazide to allow BP margin for rate control of Afib -now on metoprolol tartrate   Gout -restart allopurinol once med rec is verified   Thrombocytopenia -due to viral infection -monitor CBC                 Family Communication: stepdaughter updated 09/18/23   Consultants:  cardiology   Code Status:  FULL    DVT Prophylaxis:  apixaban     Procedures: As Listed in Progress Note Above   Antibiotics: None            Subjective: Pt is feeling better.  Still has cough, but breathing better.  Denies f/c, cp, n/v.  Has some loose stool without hematochezia or melena  Objective: Vitals:   09/18/23 0933 09/18/23 1407 09/18/23 1455 09/18/23 1500  BP: (!) 106/53  (!) 98/54 (!) 106/58  Pulse:   86   Resp:   20  Temp:   98.3 F (36.8 C)   TempSrc:   Oral   SpO2:  92% 91%   Weight:      Height:       No intake or output data in the 24 hours ending 09/18/23 1646 Weight change:  Exam:  General:  Pt is alert, follows commands appropriately, not in acute  distress HEENT: No icterus, No thrush, No neck mass, [place]/AT Cardiovascular: RRR, S1/S2, no rubs, no gallops Respiratory: bibasilar rales.  No wheeze Abdomen: Soft/+BS, non tender, non distended, no guarding Extremities: no LE edema, No lymphangitis, No petechiae, No rashes, no synovitis   Data Reviewed: I have personally reviewed following labs and imaging studies Basic Metabolic Panel: Recent Labs  Lab 09/15/23 1740 09/16/23 0439 09/17/23 0535  NA 135 136 134*  K 3.8 3.5 4.1  CL 97* 99 101  CO2 24 26 25   GLUCOSE 108* 93 95  BUN 19 21 19   CREATININE 0.94 0.97 0.88  CALCIUM 8.8* 8.2* 8.3*  MG  --  1.9 2.1  PHOS  --  3.4  --    Liver Function Tests: Recent Labs  Lab 09/15/23 1740 09/16/23 0439  AST 34 30  ALT 24 20  ALKPHOS 76 63  BILITOT 1.1 1.1  PROT 7.1 6.2*  ALBUMIN 3.5 3.0*   No results for input(s): "LIPASE", "AMYLASE" in the last 168 hours. No results for input(s): "AMMONIA" in the last 168 hours. Coagulation Profile: No results for input(s): "INR", "PROTIME" in the last 168 hours. CBC: Recent Labs  Lab 09/15/23 1740 09/16/23 0439 09/17/23 0535 09/18/23 0400  WBC 6.8 6.7 3.6* 3.7*  NEUTROABS 5.5  --   --   --   HGB 14.8 14.2 13.4 13.2  HCT 45.9 45.4 40.9 39.8  MCV 94.4 96.6 94.7 94.1  PLT 164 143* 149* 143*   Cardiac Enzymes: No results for input(s): "CKTOTAL", "CKMB", "CKMBINDEX", "TROPONINI" in the last 168 hours. BNP: Invalid input(s): "POCBNP" CBG: No results for input(s): "GLUCAP" in the last 168 hours. HbA1C: No results for input(s): "HGBA1C" in the last 72 hours. Urine analysis:    Component Value Date/Time   COLORURINE YELLOW 11/16/2019 1503   APPEARANCEUR HAZY (A) 11/16/2019 1503   LABSPEC 1.020 11/16/2019 1503   PHURINE 5.0 11/16/2019 1503   GLUCOSEU NEGATIVE 11/16/2019 1503   HGBUR NEGATIVE 11/16/2019 1503   BILIRUBINUR NEGATIVE 11/16/2019 1503   BILIRUBINUR neg 07/03/2015 0846   KETONESUR NEGATIVE 11/16/2019 1503   PROTEINUR  NEGATIVE 11/16/2019 1503   UROBILINOGEN negative 07/03/2015 0846   NITRITE NEGATIVE 11/16/2019 1503   LEUKOCYTESUR TRACE (A) 11/16/2019 1503   Sepsis Labs: @LABRCNTIP (procalcitonin:4,lacticidven:4) ) Recent Results (from the past 240 hours)  Resp panel by RT-PCR (RSV, Flu A&B, Covid) Anterior Nasal Swab     Status: Abnormal   Collection Time: 09/15/23  4:55 PM   Specimen: Anterior Nasal Swab  Result Value Ref Range Status   SARS Coronavirus 2 by RT PCR NEGATIVE NEGATIVE Final    Comment: (NOTE) SARS-CoV-2 target nucleic acids are NOT DETECTED.  The SARS-CoV-2 RNA is generally detectable in upper respiratory specimens during the acute phase of infection. The lowest concentration of SARS-CoV-2 viral copies this assay can detect is 138 copies/mL. A negative result does not preclude SARS-Cov-2 infection and should not be used as the sole basis for treatment or other patient management decisions. A negative result may occur with  improper specimen collection/handling, submission of specimen other than nasopharyngeal swab, presence of viral mutation(s) within the  areas targeted by this assay, and inadequate number of viral copies(<138 copies/mL). A negative result must be combined with clinical observations, patient history, and epidemiological information. The expected result is Negative.  Fact Sheet for Patients:  BloggerCourse.com  Fact Sheet for Healthcare Providers:  SeriousBroker.it  This test is no t yet approved or cleared by the [place] FDA and  has been authorized for detection and/or diagnosis of SARS-CoV-2 by FDA under an Emergency Use Authorization (EUA). This EUA will remain  in effect (meaning this test can be used) for the duration of the COVID-19 declaration under Section 564(b)(1) of the Act, 21 U.S.C.section 360bbb-3(b)(1), unless the authorization is terminated  or revoked sooner.       Influenza A by  PCR POSITIVE (A) NEGATIVE Final   Influenza B by PCR NEGATIVE NEGATIVE Final    Comment: (NOTE) The Xpert Xpress SARS-CoV-2/FLU/RSV plus assay is intended as an aid in the diagnosis of influenza from Nasopharyngeal swab specimens and should not be used as a sole basis for treatment. Nasal washings and aspirates are unacceptable for Xpert Xpress SARS-CoV-2/FLU/RSV testing.  Fact Sheet for Patients: BloggerCourse.com  Fact Sheet for Healthcare Providers: SeriousBroker.it  This test is not yet approved or cleared by the [place] FDA and has been authorized for detection and/or diagnosis of SARS-CoV-2 by FDA under an Emergency Use Authorization (EUA). This EUA will remain in effect (meaning this test can be used) for the duration of the COVID-19 declaration under Section 564(b)(1) of the Act, 21 U.S.C. section 360bbb-3(b)(1), unless the authorization is terminated or revoked.     Resp Syncytial Virus by PCR NEGATIVE NEGATIVE Final    Comment: (NOTE) Fact Sheet for Patients: BloggerCourse.com  Fact Sheet for Healthcare Providers: SeriousBroker.it  This test is not yet approved or cleared by the [place] FDA and has been authorized for detection and/or diagnosis of SARS-CoV-2 by FDA under an Emergency Use Authorization (EUA). This EUA will remain in effect (meaning this test can be used) for the duration of the COVID-19 declaration under Section 564(b)(1) of the Act, 21 U.S.C. section 360bbb-3(b)(1), unless the authorization is terminated or revoked.  Performed at [facility], [address], [place], [place] [address]   MRSA Next Gen by PCR, Nasal     Status: None   Collection Time: 09/16/23 10:54 AM   Specimen: Nasal Mucosa; Nasal Swab  Result Value Ref Range Status   MRSA by PCR Next Gen NOT DETECTED NOT DETECTED Final    Comment: (NOTE) The GeneXpert MRSA Assay  (FDA approved for NASAL specimens only), is one component of a comprehensive MRSA colonization surveillance program. It is not intended to diagnose MRSA infection nor to guide or monitor treatment for MRSA infections. Test performance is not FDA approved in patients less than 55 years old. Performed at [facility], [address], [place], [place] [address]   Culture, blood (Routine X 2) w Reflex to ID Panel     Status: None (Preliminary result)   Collection Time: 09/16/23  6:00 PM   Specimen: BLOOD  Result Value Ref Range Status   Specimen Description BLOOD BLOOD LEFT HAND  Final   Special Requests   Final    BOTTLES DRAWN AEROBIC ONLY Blood Culture results may not be optimal due to an inadequate volume of blood received in culture bottles   Culture   Final    NO GROWTH 2 DAYS Performed at [facility], [address] [place], [place] [address]    Report  Status PENDING  Incomplete  Culture, blood (Routine X 2) w Reflex to ID Panel     Status: None (Preliminary result)   Collection Time: 09/16/23  6:06 PM   Specimen: BLOOD LEFT FOREARM  Result Value Ref Range Status   Specimen Description   Final    BLOOD LEFT FOREARM BOTTLES DRAWN AEROBIC AND ANAEROBIC   Special Requests   Final    Blood Culture results may not be optimal due to an inadequate volume of blood received in culture bottles   Culture   Final    NO GROWTH 2 DAYS Performed at [facility], [address], [place], [place] [address]    Report Status PENDING  Incomplete     Scheduled Meds:  apixaban  5 mg Oral BID   budesonide (PULMICORT) nebulizer solution  0.5 mg Nebulization BID   Chlorhexidine Gluconate Cloth  6 each Topical Q0600   dextromethorphan-guaiFENesin  1 tablet Oral BID   levalbuterol  0.63 mg Nebulization TID   metoprolol tartrate  37.5 mg Oral BID   pantoprazole  40 mg Oral Daily   Continuous Infusions:  Procedures/Studies: ECHOCARDIOGRAM COMPLETE Result Date: 09/17/2023    ECHOCARDIOGRAM REPORT    Patient Name:   Mary Moss Date of Exam: 09/17/2023 Medical Rec #:  [id number]         Height:       68.0 in Accession #:    [id number]        Weight:       211.9 lb Date of Birth:  March 08, 1936        BSA:          2.094 m Patient Age:    87 years          BP:           114/68 mmHg Patient Gender: F                 HR:           78 bpm. [facility] Procedure: 2D Echo, Cardiac Doppler and Color Doppler (Both Spectral and Color            Flow Doppler were utilized during procedure). Indications:    I48.91* Unspeicified atrial fibrillation  History:        Patient has no prior history of Echocardiogram examinations.                 Signs/Symptoms:Current on this admission-FLU A POSITIVE; Risk                 Factors:Hypertension and Dyslipidemia.  Sonographer:    Dominica Severin RCS, RVS Referring Phys: [phone] Sanii Kukla  Sonographer Comments: Global longitudinal strain was attempted. IMPRESSIONS  1. Left ventricular ejection fraction, by estimation, is 55 to 60%. The left ventricle has normal function. The left ventricle has no regional wall motion abnormalities. There is mild left ventricular hypertrophy. Left ventricular diastolic parameters were normal. The global longitudinal strain is indeterminate.  2. Right ventricular systolic function is normal. The right ventricular size is normal. Tricuspid regurgitation signal is inadequate for assessing PA pressure.  3. The mitral valve is normal in structure. No evidence of mitral valve regurgitation. No evidence of mitral stenosis.  4. The tricuspid valve is abnormal.  5. The aortic valve was not well visualized. There is mild calcification of the aortic valve. There is mild thickening of the aortic valve. Aortic valve regurgitation is not visualized. No aortic stenosis  is present.  6. The inferior vena cava is normal in size with greater than 50% respiratory variability, suggesting right atrial pressure of 3 mmHg. Comparison(s): No prior  Echocardiogram. FINDINGS  Left Ventricle: Left ventricular ejection fraction, by estimation, is 55 to 60%. The left ventricle has normal function. The left ventricle has no regional wall motion abnormalities. Strain was performed and the global longitudinal strain is indeterminate. The left ventricular internal cavity size was normal in size. There is mild left ventricular hypertrophy. Left ventricular diastolic parameters were normal. Right Ventricle: The right ventricular size is normal. Right vetricular wall thickness was not well visualized. Right ventricular systolic function is normal. Tricuspid regurgitation signal is inadequate for assessing PA pressure. Left Atrium: Left atrial size was normal in size. Right Atrium: Right atrial size was normal in size. Pericardium: There is no evidence of pericardial effusion. Mitral Valve: The mitral valve is normal in structure. There is mild thickening of the mitral valve leaflet(s). There is mild calcification of the mitral valve leaflet(s). Mild mitral annular calcification. No evidence of mitral valve regurgitation. No evidence of mitral valve stenosis. MV peak gradient, 4.8 mmHg. The mean mitral valve gradient is 2.0 mmHg. Tricuspid Valve: The tricuspid valve is abnormal. Tricuspid valve regurgitation is mild . No evidence of tricuspid stenosis. Aortic Valve: The aortic valve was not well visualized. There is mild calcification of the aortic valve. There is mild thickening of the aortic valve. There is mild aortic valve annular calcification. Aortic valve regurgitation is not visualized. No aortic stenosis is present. Aortic valve mean gradient measures 6.0 mmHg. Aortic valve peak gradient measures 11.0 mmHg. Aortic valve area, by VTI measures 2.21 cm. Pulmonic Valve: The pulmonic valve was not well visualized. Pulmonic valve regurgitation is not visualized. No evidence of pulmonic stenosis. Aorta: The aortic root and ascending aorta are structurally normal, with  no evidence of dilitation. Venous: The inferior vena cava is normal in size with greater than 50% respiratory variability, suggesting right atrial pressure of 3 mmHg. IAS/Shunts: No atrial level shunt detected by color flow Doppler. Additional Comments: 3D imaging was not performed.  LEFT VENTRICLE PLAX 2D LVIDd:         4.30 cm     Diastology LVIDs:         3.00 cm     LV e' medial:    8.16 cm/s LV PW:         1.10 cm     LV E/e' medial:  11.6 LV IVS:        1.10 cm     LV e' lateral:   10.20 cm/s LVOT diam:     2.00 cm     LV E/e' lateral: 9.2 LV SV:         70 LV SV Index:   33 LVOT Area:     3.14 cm  LV Volumes (MOD) LV vol d, MOD A2C: 63.7 ml LV vol d, MOD A4C: 50.8 ml LV vol s, MOD A2C: 24.1 ml LV vol s, MOD A4C: 21.8 ml LV SV MOD A2C:     39.6 ml LV SV MOD A4C:     50.8 ml LV SV MOD BP:      34.6 ml RIGHT VENTRICLE RV Basal diam:  3.40 cm RV Mid diam:    3.40 cm RV S prime:     12.30 cm/s TAPSE (M-mode): 3.2 cm LEFT ATRIUM             Index  RIGHT ATRIUM           Index LA diam:        3.20 cm 1.53 cm/m   RA Area:     13.20 cm LA Vol (A2C):   31.8 ml 15.18 ml/m  RA Volume:   24.40 ml  11.65 ml/m LA Vol (A4C):   36.1 ml 17.24 ml/m LA Biplane Vol: 36.4 ml 17.38 ml/m  AORTIC VALVE                     PULMONIC VALVE AV Area (Vmax):    2.12 cm      PV Vmax:       0.77 m/s AV Area (Vmean):   2.01 cm      PV Peak grad:  2.4 mmHg AV Area (VTI):     2.21 cm AV Vmax:           166.00 cm/s AV Vmean:          120.000 cm/s AV VTI:            0.317 m AV Peak Grad:      11.0 mmHg AV Mean Grad:      6.0 mmHg LVOT Vmax:         112.00 cm/s LVOT Vmean:        76.900 cm/s LVOT VTI:          0.223 m LVOT/AV VTI ratio: 0.70  AORTA Ao Root diam: 2.90 cm Ao Asc diam:  2.90 cm MITRAL VALVE MV Area (PHT): 4.80 cm     SHUNTS MV Area VTI:   2.31 cm     Systemic VTI:  0.22 m MV Peak grad:  4.8 mmHg     Systemic Diam: 2.00 cm MV Mean grad:  2.0 mmHg MV Vmax:       1.09 m/s MV Vmean:      69.6 cm/s MV Decel Time: 158  msec MV E velocity: 94.30 cm/s MV A velocity: 108.00 cm/s MV E/A ratio:  0.87 Dina Rich MD Electronically signed by Dina Rich MD Signature Date/Time: 09/17/2023/12:21:51 PM    Final    DG Chest 2 View Result Date: 09/15/2023 CLINICAL DATA:  Shortness of breath patient reports flu like symptoms. Hypoxia. EXAM: CHEST - 2 VIEW COMPARISON:  10/08/2021 FINDINGS: Chronic hyperinflation. Mild bronchial thickening. Ill-defined opacity in the lung bases suggestive of atelectasis and possible trace effusions. Stable heart size and mediastinal contours. Aortic atherosclerosis. No pulmonary edema. No pneumothorax. Reverse right shoulder arthroplasty. IMPRESSION: 1. Chronic hyperinflation with mild bronchial thickening. 2. Ill-defined opacity in the lung bases suggestive of atelectasis and possible trace effusions. Electronically Signed   By: Narda Rutherford M.D.   On: 09/15/2023 17:25    Catarina Hartshorn, DO  [facility]  If 7PM-7AM, please contact night-coverage [email or web address] Password [facility] 09/18/2023, 4:46 PM   LOS: 3 days

## 2023-09-19 DIAGNOSIS — R6510 Systemic inflammatory response syndrome (SIRS) of non-infectious origin without acute organ dysfunction: Secondary | ICD-10-CM | POA: Diagnosis not present

## 2023-09-19 DIAGNOSIS — J9601 Acute respiratory failure with hypoxia: Secondary | ICD-10-CM | POA: Diagnosis not present

## 2023-09-19 DIAGNOSIS — I480 Paroxysmal atrial fibrillation: Secondary | ICD-10-CM | POA: Diagnosis not present

## 2023-09-19 DIAGNOSIS — J101 Influenza due to other identified influenza virus with other respiratory manifestations: Secondary | ICD-10-CM | POA: Diagnosis not present

## 2023-09-19 LAB — CBC
HCT: 40.5 % (ref 36.0–46.0)
Hemoglobin: 13.1 g/dL (ref 12.0–15.0)
MCH: 30.4 pg (ref 26.0–34.0)
MCHC: 32.3 g/dL (ref 30.0–36.0)
MCV: 94 fL (ref 80.0–100.0)
Platelets: 169 10*3/uL (ref 150–400)
RBC: 4.31 MIL/uL (ref 3.87–5.11)
RDW: 12.4 % (ref 11.5–15.5)
WBC: 4.2 10*3/uL (ref 4.0–10.5)
nRBC: 0 % (ref 0.0–0.2)

## 2023-09-19 MED ORDER — MELATONIN 3 MG PO TABS
6.0000 mg | ORAL_TABLET | Freq: Every evening | ORAL | Status: DC | PRN
  Administered 2023-09-19 – 2023-09-20 (×2): 6 mg via ORAL
  Filled 2023-09-19 (×2): qty 2

## 2023-09-19 NOTE — Progress Notes (Signed)
 PROGRESS NOTE  Mary Moss WJX:[id number] DOB: 02-12-1936 DOA: 09/15/2023 PCP: Adrian Prince, MD  Brief History:  88 year old female with history of hypertension, GERD/PUD, CKD stage III, seropositive rheumatoid arthritis, hyperlipidemia, impaired glucose tolerance, and vitamin D deficiency presenting with 5-day history of generalized weakness, nonproductive cough, myalgias, arthralgias, and fevers and chills.  The patient progressively felt worse over the last 1 to 2 days.  She had some shortness of breath.  She complained of some loose stools without any hematochezia or melena.  She denies any chest pain or abdominal pain.  She denies any hemoptysis.  She denies any headache or neck pain.  She has some nausea without any emesis.  Her appetite has been poor. Patient endorses previous history of tobacco.  She quit 16 years ago after approximately 15-20-pack-year history. In the ED, the patient had a temperature up to 100.7 F.  She was hemodynamically stable with oxygen saturation 85% on room air.  She was placed on 2 L with saturation 92-94%. WBC 6.8, hemoglobin 14.8, platelets 168.  Sodium 135, potassium 3.8, bicarbonate 24, serum creatinine 0.94.  LFTs were unremarkable.  The patient was given a 500 cc normal saline bolus in the ED.  The patient was offered oseltamavir, but she deferred expressing concern regarding side effects.  While in the emergency department, the patient developed atrial fibrillation with RVR.  She was started on diltiazem drip.  She converted back to sinus later in the day.  She was transitioned to po metoprolol.  Cardiology was consulted to assist with management.    Assessment/Plan:    Acute respiratory failure with hypoxia -Secondary to influenza -Initially placed on 4 L [place]>>2L -Wean oxygen for saturation greater 90%   Influenza pneumonitis -Patient deferred taking oseltamavir after discussion of the risks, benefits, and alternatives -Continue  supportive care -continue xopenex -continue pulmicort -continue brovana   New onset atrial fibrillation with RVR, type unspecified -Started diltiazem drip>>spontaneous conversion to sinus -transitioned to po metoprolol -appreciate cardiology -09/17/23 Echo-EF 55-60%%, no WMA, normal RVF -TSH--1.064 -Optimize electrolytes -CHADSVASc = 4 (age x 2, female, HTN) -initially expresses concern regarding anticoagulation initially but ultimately agreed (01/21/22 EGD--nonbleeding gastric ulcers--attributed to NSAID induced) and pt was on xarelto for DVT prophylaxis at the time -appreciate cardiology -IV heparin>>apixaban -Hgb remains stable on apixaban   GERD -hx of PUD -continue pantoprazole   Seropositive RA -receives golinumab every 2 months--last dose 08/31/23 -plan to restart leflunomide and hydroxychloroquine   Essential HTN -holding lisinopril and hydrochlorothiazide to allow BP margin for rate control of Afib -now on metoprolol tartrate -BP controlled   Gout -restart allopurinol once med rec is verified   Thrombocytopenia -due to viral infection -monitor CBC                 Family Communication: stepdaughter updated 09/19/23   Consultants:  cardiology   Code Status:  FULL    DVT Prophylaxis:  apixaban     Procedures: As Listed in Progress Note Above   Antibiotics: None         Subjective: Pt is gradually feeling better.  Denies f/c, cp, n/v/d, has some dyspnea on exertion.  Denies abd pain  Objective: Vitals:   09/19/23 0422 09/19/23 0738 09/19/23 1322 09/19/23 1429  BP: (!) 117/56   (!) 124/55  Pulse: 85   86  Resp: (!) 23   16  Temp: 99 F (37.2 C)   99 F (37.2 C)  TempSrc: Oral   Oral  SpO2: 92% 92% 91% 93%  Weight:      Height:       No intake or output data in the 24 hours ending 09/19/23 1758 Weight change:  Exam:  General:  Pt is alert, follows commands appropriately, not in acute distress HEENT: No icterus, No thrush, No neck  mass, Mary Moss/AT Cardiovascular: RRR, S1/S2, no rubs, no gallops Respiratory: bilateral rales.  No wheeze Abdomen: Soft/+BS, non tender, non distended, no guarding Extremities: trace LE edema, No lymphangitis, No petechiae, No rashes, no synovitis   Data Reviewed: I have personally reviewed following labs and imaging studies Basic Metabolic Panel: Recent Labs  Lab 09/15/23 1740 09/16/23 0439 09/17/23 0535  NA 135 136 134*  K 3.8 3.5 4.1  CL 97* 99 101  CO2 24 26 25   GLUCOSE 108* 93 95  BUN 19 21 19   CREATININE 0.94 0.97 0.88  CALCIUM 8.8* 8.2* 8.3*  MG  --  1.9 2.1  PHOS  --  3.4  --    Liver Function Tests: Recent Labs  Lab 09/15/23 1740 09/16/23 0439  AST 34 30  ALT 24 20  ALKPHOS 76 63  BILITOT 1.1 1.1  PROT 7.1 6.2*  ALBUMIN 3.5 3.0*   No results for input(s): "LIPASE", "AMYLASE" in the last 168 hours. No results for input(s): "AMMONIA" in the last 168 hours. Coagulation Profile: No results for input(s): "INR", "PROTIME" in the last 168 hours. CBC: Recent Labs  Lab 09/15/23 1740 09/16/23 0439 09/17/23 0535 09/18/23 0400 09/19/23 0409  WBC 6.8 6.7 3.6* 3.7* 4.2  NEUTROABS 5.5  --   --   --   --   HGB 14.8 14.2 13.4 13.2 13.1  HCT 45.9 45.4 40.9 39.8 40.5  MCV 94.4 96.6 94.7 94.1 94.0  PLT 164 143* 149* 143* 169   Cardiac Enzymes: No results for input(s): "CKTOTAL", "CKMB", "CKMBINDEX", "TROPONINI" in the last 168 hours. BNP: Invalid input(s): "POCBNP" CBG: No results for input(s): "GLUCAP" in the last 168 hours. HbA1C: No results for input(s): "HGBA1C" in the last 72 hours. Urine analysis:    Component Value Date/Time   COLORURINE YELLOW 11/16/2019 1503   APPEARANCEUR HAZY (A) 11/16/2019 1503   LABSPEC 1.020 11/16/2019 1503   PHURINE 5.0 11/16/2019 1503   GLUCOSEU NEGATIVE 11/16/2019 1503   HGBUR NEGATIVE 11/16/2019 1503   BILIRUBINUR NEGATIVE 11/16/2019 1503   BILIRUBINUR neg 07/03/2015 0846   KETONESUR NEGATIVE 11/16/2019 1503   PROTEINUR  NEGATIVE 11/16/2019 1503   UROBILINOGEN negative 07/03/2015 0846   NITRITE NEGATIVE 11/16/2019 1503   LEUKOCYTESUR TRACE (A) 11/16/2019 1503   Sepsis Labs: @LABRCNTIP (procalcitonin:4,lacticidven:4) ) Recent Results (from the past 240 hours)  Resp panel by RT-PCR (RSV, Flu A&B, Covid) Anterior Nasal Swab     Status: Abnormal   Collection Time: 09/15/23  4:55 PM   Specimen: Anterior Nasal Swab  Result Value Ref Range Status   SARS Coronavirus 2 by RT PCR NEGATIVE NEGATIVE Final    Comment: (NOTE) SARS-CoV-2 target nucleic acids are NOT DETECTED.  The SARS-CoV-2 RNA is generally detectable in upper respiratory specimens during the acute phase of infection. The lowest concentration of SARS-CoV-2 viral copies this assay can detect is 138 copies/mL. A negative result does not preclude SARS-Cov-2 infection and should not be used as the sole basis for treatment or other patient management decisions. A negative result may occur with  improper specimen collection/handling, submission of specimen other than nasopharyngeal swab, presence of viral mutation(s) within  the areas targeted by this assay, and inadequate number of viral copies(<138 copies/mL). A negative result must be combined with clinical observations, patient history, and epidemiological information. The expected result is Negative.  Fact Sheet for Patients:  BloggerCourse.com  Fact Sheet for Healthcare Providers:  SeriousBroker.it  This test is no t yet approved or cleared by the [place] FDA and  has been authorized for detection and/or diagnosis of SARS-CoV-2 by FDA under an Emergency Use Authorization (EUA). This EUA will remain  in effect (meaning this test can be used) for the duration of the COVID-19 declaration under Section 564(b)(1) of the Act, 21 U.S.C.section 360bbb-3(b)(1), unless the authorization is terminated  or revoked sooner.       Influenza A by  PCR POSITIVE (A) NEGATIVE Final   Influenza B by PCR NEGATIVE NEGATIVE Final    Comment: (NOTE) The Xpert Xpress SARS-CoV-2/FLU/RSV plus assay is intended as an aid in the diagnosis of influenza from Nasopharyngeal swab specimens and should not be used as a sole basis for treatment. Nasal washings and aspirates are unacceptable for Xpert Xpress SARS-CoV-2/FLU/RSV testing.  Fact Sheet for Patients: BloggerCourse.com  Fact Sheet for Healthcare Providers: SeriousBroker.it  This test is not yet approved or cleared by the [place] FDA and has been authorized for detection and/or diagnosis of SARS-CoV-2 by FDA under an Emergency Use Authorization (EUA). This EUA will remain in effect (meaning this test can be used) for the duration of the COVID-19 declaration under Section 564(b)(1) of the Act, 21 U.S.C. section 360bbb-3(b)(1), unless the authorization is terminated or revoked.     Resp Syncytial Virus by PCR NEGATIVE NEGATIVE Final    Comment: (NOTE) Fact Sheet for Patients: BloggerCourse.com  Fact Sheet for Healthcare Providers: SeriousBroker.it  This test is not yet approved or cleared by the [place] FDA and has been authorized for detection and/or diagnosis of SARS-CoV-2 by FDA under an Emergency Use Authorization (EUA). This EUA will remain in effect (meaning this test can be used) for the duration of the COVID-19 declaration under Section 564(b)(1) of the Act, 21 U.S.C. section 360bbb-3(b)(1), unless the authorization is terminated or revoked.  Performed at [facility], [address], [place], [place] [address]   MRSA Next Gen by PCR, Nasal     Status: None   Collection Time: 09/16/23 10:54 AM   Specimen: Nasal Mucosa; Nasal Swab  Result Value Ref Range Status   MRSA by PCR Next Gen NOT DETECTED NOT DETECTED Final    Comment: (NOTE) The GeneXpert MRSA Assay  (FDA approved for NASAL specimens only), is one component of a comprehensive MRSA colonization surveillance program. It is not intended to diagnose MRSA infection nor to guide or monitor treatment for MRSA infections. Test performance is not FDA approved in patients less than 49 years old. Performed at [facility], [address], [place], [place] [address]   Culture, blood (Routine X 2) w Reflex to ID Panel     Status: None (Preliminary result)   Collection Time: 09/16/23  6:00 PM   Specimen: BLOOD  Result Value Ref Range Status   Specimen Description BLOOD BLOOD LEFT HAND  Final   Special Requests   Final    BOTTLES DRAWN AEROBIC ONLY Blood Culture results may not be optimal due to an inadequate volume of blood received in culture bottles   Culture   Final    NO GROWTH 3 DAYS Performed at [facility], [address] [place], [place] [address]  Report Status PENDING  Incomplete  Culture, blood (Routine X 2) w Reflex to ID Panel     Status: None (Preliminary result)   Collection Time: 09/16/23  6:06 PM   Specimen: BLOOD LEFT FOREARM  Result Value Ref Range Status   Specimen Description   Final    BLOOD LEFT FOREARM BOTTLES DRAWN AEROBIC AND ANAEROBIC   Special Requests   Final    Blood Culture results may not be optimal due to an inadequate volume of blood received in culture bottles   Culture   Final    NO GROWTH 3 DAYS Performed at [facility], [address], [place], [place] [address]    Report Status PENDING  Incomplete     Scheduled Meds:  apixaban  5 mg Oral BID   arformoterol  15 mcg Nebulization BID   budesonide (PULMICORT) nebulizer solution  0.5 mg Nebulization BID   Chlorhexidine Gluconate Cloth  6 each Topical Q0600   dextromethorphan-guaiFENesin  1 tablet Oral BID   levalbuterol  0.63 mg Nebulization TID   metoprolol tartrate  37.5 mg Oral BID   pantoprazole  40 mg Oral Daily   Continuous Infusions:  Procedures/Studies: ECHOCARDIOGRAM COMPLETE Result  Date: 09/17/2023    ECHOCARDIOGRAM REPORT   Patient Name:   Mary Moss Date of Exam: 09/17/2023 Medical Rec #:  [id number]         Height:       68.0 in Accession #:    [id number]        Weight:       211.9 lb Date of Birth:  Jun 16, 1936        BSA:          2.094 m Patient Age:    87 years          BP:           114/68 mmHg Patient Gender: F                 HR:           78 bpm. [facility] Procedure: 2D Echo, Cardiac Doppler and Color Doppler (Both Spectral and Color            Flow Doppler were utilized during procedure). Indications:    I48.91* Unspeicified atrial fibrillation  History:        Patient has no prior history of Echocardiogram examinations.                 Signs/Symptoms:Current on this admission-FLU A POSITIVE; Risk                 Factors:Hypertension and Dyslipidemia.  Sonographer:    Dominica Severin RCS, RVS Referring Phys: [phone] Odai Wimmer  Sonographer Comments: Global longitudinal strain was attempted. IMPRESSIONS  1. Left ventricular ejection fraction, by estimation, is 55 to 60%. The left ventricle has normal function. The left ventricle has no regional wall motion abnormalities. There is mild left ventricular hypertrophy. Left ventricular diastolic parameters were normal. The global longitudinal strain is indeterminate.  2. Right ventricular systolic function is normal. The right ventricular size is normal. Tricuspid regurgitation signal is inadequate for assessing PA pressure.  3. The mitral valve is normal in structure. No evidence of mitral valve regurgitation. No evidence of mitral stenosis.  4. The tricuspid valve is abnormal.  5. The aortic valve was not well visualized. There is mild calcification of the aortic valve. There is mild thickening of the aortic valve.  Aortic valve regurgitation is not visualized. No aortic stenosis is present.  6. The inferior vena cava is normal in size with greater than 50% respiratory variability, suggesting right atrial pressure of 3  mmHg. Comparison(s): No prior Echocardiogram. FINDINGS  Left Ventricle: Left ventricular ejection fraction, by estimation, is 55 to 60%. The left ventricle has normal function. The left ventricle has no regional wall motion abnormalities. Strain was performed and the global longitudinal strain is indeterminate. The left ventricular internal cavity size was normal in size. There is mild left ventricular hypertrophy. Left ventricular diastolic parameters were normal. Right Ventricle: The right ventricular size is normal. Right vetricular wall thickness was not well visualized. Right ventricular systolic function is normal. Tricuspid regurgitation signal is inadequate for assessing PA pressure. Left Atrium: Left atrial size was normal in size. Right Atrium: Right atrial size was normal in size. Pericardium: There is no evidence of pericardial effusion. Mitral Valve: The mitral valve is normal in structure. There is mild thickening of the mitral valve leaflet(s). There is mild calcification of the mitral valve leaflet(s). Mild mitral annular calcification. No evidence of mitral valve regurgitation. No evidence of mitral valve stenosis. MV peak gradient, 4.8 mmHg. The mean mitral valve gradient is 2.0 mmHg. Tricuspid Valve: The tricuspid valve is abnormal. Tricuspid valve regurgitation is mild . No evidence of tricuspid stenosis. Aortic Valve: The aortic valve was not well visualized. There is mild calcification of the aortic valve. There is mild thickening of the aortic valve. There is mild aortic valve annular calcification. Aortic valve regurgitation is not visualized. No aortic stenosis is present. Aortic valve mean gradient measures 6.0 mmHg. Aortic valve peak gradient measures 11.0 mmHg. Aortic valve area, by VTI measures 2.21 cm. Pulmonic Valve: The pulmonic valve was not well visualized. Pulmonic valve regurgitation is not visualized. No evidence of pulmonic stenosis. Aorta: The aortic root and ascending aorta  are structurally normal, with no evidence of dilitation. Venous: The inferior vena cava is normal in size with greater than 50% respiratory variability, suggesting right atrial pressure of 3 mmHg. IAS/Shunts: No atrial level shunt detected by color flow Doppler. Additional Comments: 3D imaging was not performed.  LEFT VENTRICLE PLAX 2D LVIDd:         4.30 cm     Diastology LVIDs:         3.00 cm     LV e' medial:    8.16 cm/s LV PW:         1.10 cm     LV E/e' medial:  11.6 LV IVS:        1.10 cm     LV e' lateral:   10.20 cm/s LVOT diam:     2.00 cm     LV E/e' lateral: 9.2 LV SV:         70 LV SV Index:   33 LVOT Area:     3.14 cm  LV Volumes (MOD) LV vol d, MOD A2C: 63.7 ml LV vol d, MOD A4C: 50.8 ml LV vol s, MOD A2C: 24.1 ml LV vol s, MOD A4C: 21.8 ml LV SV MOD A2C:     39.6 ml LV SV MOD A4C:     50.8 ml LV SV MOD BP:      34.6 ml RIGHT VENTRICLE RV Basal diam:  3.40 cm RV Mid diam:    3.40 cm RV S prime:     12.30 cm/s TAPSE (M-mode): 3.2 cm LEFT ATRIUM  Index        RIGHT ATRIUM           Index LA diam:        3.20 cm 1.53 cm/m   RA Area:     13.20 cm LA Vol (A2C):   31.8 ml 15.18 ml/m  RA Volume:   24.40 ml  11.65 ml/m LA Vol (A4C):   36.1 ml 17.24 ml/m LA Biplane Vol: 36.4 ml 17.38 ml/m  AORTIC VALVE                     PULMONIC VALVE AV Area (Vmax):    2.12 cm      PV Vmax:       0.77 m/s AV Area (Vmean):   2.01 cm      PV Peak grad:  2.4 mmHg AV Area (VTI):     2.21 cm AV Vmax:           166.00 cm/s AV Vmean:          120.000 cm/s AV VTI:            0.317 m AV Peak Grad:      11.0 mmHg AV Mean Grad:      6.0 mmHg LVOT Vmax:         112.00 cm/s LVOT Vmean:        76.900 cm/s LVOT VTI:          0.223 m LVOT/AV VTI ratio: 0.70  AORTA Ao Root diam: 2.90 cm Ao Asc diam:  2.90 cm MITRAL VALVE MV Area (PHT): 4.80 cm     SHUNTS MV Area VTI:   2.31 cm     Systemic VTI:  0.22 m MV Peak grad:  4.8 mmHg     Systemic Diam: 2.00 cm MV Mean grad:  2.0 mmHg MV Vmax:       1.09 m/s MV Vmean:       69.6 cm/s MV Decel Time: 158 msec MV E velocity: 94.30 cm/s MV A velocity: 108.00 cm/s MV E/A ratio:  0.87 Dina Rich MD Electronically signed by Dina Rich MD Signature Date/Time: 09/17/2023/12:21:51 PM    Final    DG Chest 2 View Result Date: 09/15/2023 CLINICAL DATA:  Shortness of breath patient reports flu like symptoms. Hypoxia. EXAM: CHEST - 2 VIEW COMPARISON:  10/08/2021 FINDINGS: Chronic hyperinflation. Mild bronchial thickening. Ill-defined opacity in the lung bases suggestive of atelectasis and possible trace effusions. Stable heart size and mediastinal contours. Aortic atherosclerosis. No pulmonary edema. No pneumothorax. Reverse right shoulder arthroplasty. IMPRESSION: 1. Chronic hyperinflation with mild bronchial thickening. 2. Ill-defined opacity in the lung bases suggestive of atelectasis and possible trace effusions. Electronically Signed   By: Narda Rutherford M.D.   On: 09/15/2023 17:25    Catarina Hartshorn, DO  [facility]  If 7PM-7AM, please contact night-coverage [email or web address] Password [facility] 09/19/2023, 5:58 PM   LOS: 4 days

## 2023-09-19 NOTE — Progress Notes (Signed)
 Central monitoring called RN's phone about patient having RVR. RN completed EKG which showed afib with RVR, 108 bpm. On call MD notified. Administered scheduled metoprolol. Patient did not c/o of pain or discomfort. Patient observed laying in bed with eyes closed and HR 96. MD order to monitor.

## 2023-09-19 NOTE — TOC Progression Note (Addendum)
 Transition of Care [facility]) - Progression Note    Patient Details  Name: Mary Moss MRN: [id number] Date of Birth: 09-30-1935  [facility]) CM/SW Contact  Catalina Gravel, LCSW Phone Number: 09/19/2023, 10:53 AM  Clinical Narrative:    CSW followed up with [facility].  Approval granted for 7 days  SNF # [id number] and [facility] # [id number] for [facility]. CSW visit pt this morning, she was sitting in her chair. We discussed SNF and approval granted as she had expressed wanting rehab versus going home. Pt was agreeable to [facility] bed offer and SNF.   CSW contacted [facility] rehab and asked if placement can happen today.  TOC to follow.  Addendum- Pt will admit when droplet precautions end.  Also not daughter requesting [facility] for transport now- so approved for [facility] but if not needed [facility]TA Crystal stated that is fine- [facility] will not need Auth.  Expected Discharge Plan: Skilled Nursing Facility Barriers to Discharge: Continued Medical Work up  Expected Discharge Plan and Services In-house Referral: Clinical Social Work Discharge Planning Services: CM Consult Post Acute Care Choice:  (still deciding) Living arrangements for the past 2 months: Single Family Home                                       Social Determinants of Health (SDOH) Interventions SDOH Screenings   Food Insecurity: No Food Insecurity (09/15/2023)  Housing: Low Risk  (09/15/2023)  Transportation Needs: No Transportation Needs (09/16/2023)  Utilities: Not At Risk (09/15/2023)  Depression (PHQ2-9): Medium Risk (01/30/2022)  Financial Resource Strain: Low Risk  (09/13/2019)  Physical Activity: Inactive (09/13/2019)  Social Connections: Moderately Integrated (09/15/2023)  Stress: No Stress Concern Present (09/13/2019)  Tobacco Use: Medium Risk (09/15/2023)    Readmission Risk Interventions     No data to display

## 2023-09-19 NOTE — Plan of Care (Signed)

## 2023-09-20 ENCOUNTER — Inpatient Hospital Stay (HOSPITAL_COMMUNITY): Payer: HMO

## 2023-09-20 DIAGNOSIS — I10 Essential (primary) hypertension: Secondary | ICD-10-CM | POA: Diagnosis not present

## 2023-09-20 DIAGNOSIS — J101 Influenza due to other identified influenza virus with other respiratory manifestations: Secondary | ICD-10-CM | POA: Diagnosis not present

## 2023-09-20 DIAGNOSIS — I480 Paroxysmal atrial fibrillation: Secondary | ICD-10-CM | POA: Diagnosis not present

## 2023-09-20 DIAGNOSIS — R6510 Systemic inflammatory response syndrome (SIRS) of non-infectious origin without acute organ dysfunction: Secondary | ICD-10-CM | POA: Diagnosis not present

## 2023-09-20 LAB — BASIC METABOLIC PANEL
Anion gap: 9 (ref 5–15)
BUN: 25 mg/dL — ABNORMAL HIGH (ref 8–23)
CO2: 24 mmol/L (ref 22–32)
Calcium: 8.3 mg/dL — ABNORMAL LOW (ref 8.9–10.3)
Chloride: 101 mmol/L (ref 98–111)
Creatinine, Ser: 1.02 mg/dL — ABNORMAL HIGH (ref 0.44–1.00)
GFR, Estimated: 54 mL/min — ABNORMAL LOW (ref >60)
Glucose, Bld: 113 mg/dL — ABNORMAL HIGH (ref 70–99)
Potassium: 3.7 mmol/L (ref 3.5–5.1)
Sodium: 134 mmol/L — ABNORMAL LOW (ref 135–145)

## 2023-09-20 LAB — CBC
HCT: 42.9 % (ref 36.0–46.0)
Hemoglobin: 13.7 g/dL (ref 12.0–15.0)
MCH: 30.2 pg (ref 26.0–34.0)
MCHC: 31.9 g/dL (ref 30.0–36.0)
MCV: 94.5 fL (ref 80.0–100.0)
Platelets: 165 10*3/uL (ref 150–400)
RBC: 4.54 MIL/uL (ref 3.87–5.11)
RDW: 12.5 % (ref 11.5–15.5)
WBC: 4.8 10*3/uL (ref 4.0–10.5)
nRBC: 0 % (ref 0.0–0.2)

## 2023-09-20 LAB — MAGNESIUM: Magnesium: 2 mg/dL (ref 1.7–2.4)

## 2023-09-20 MED ORDER — SENNA 8.6 MG PO TABS
2.0000 | ORAL_TABLET | Freq: Every day | ORAL | Status: DC
  Administered 2023-09-20 – 2023-09-22 (×2): 17.2 mg via ORAL
  Filled 2023-09-20 (×3): qty 2

## 2023-09-20 MED ORDER — POLYETHYLENE GLYCOL 3350 17 G PO PACK
17.0000 g | PACK | Freq: Every day | ORAL | Status: DC
  Administered 2023-09-20 – 2023-09-22 (×2): 17 g via ORAL
  Filled 2023-09-20 (×3): qty 1

## 2023-09-20 MED ORDER — HYDROCORTISONE 1 % EX CREA
TOPICAL_CREAM | Freq: Two times a day (BID) | CUTANEOUS | Status: DC
  Administered 2023-09-21: 1 via TOPICAL
  Filled 2023-09-20: qty 28

## 2023-09-20 MED ORDER — PANTOPRAZOLE SODIUM 40 MG PO TBEC
40.0000 mg | DELAYED_RELEASE_TABLET | Freq: Two times a day (BID) | ORAL | Status: DC
  Administered 2023-09-20 – 2023-09-22 (×4): 40 mg via ORAL
  Filled 2023-09-20 (×4): qty 1

## 2023-09-20 MED ORDER — IPRATROPIUM BROMIDE 0.02 % IN SOLN
0.5000 mg | Freq: Three times a day (TID) | RESPIRATORY_TRACT | Status: DC
  Administered 2023-09-20 – 2023-09-22 (×5): 0.5 mg via RESPIRATORY_TRACT
  Filled 2023-09-20 (×5): qty 2.5

## 2023-09-20 MED ORDER — IPRATROPIUM BROMIDE 0.02 % IN SOLN: 0.5000 mg | Freq: Four times a day (QID) | RESPIRATORY_TRACT | Status: DC

## 2023-09-20 MED ORDER — IPRATROPIUM BROMIDE 0.02 % IN SOLN: 0.5000 mg | Freq: Three times a day (TID) | RESPIRATORY_TRACT | Status: DC

## 2023-09-20 MED ORDER — LEFLUNOMIDE 20 MG PO TABS
20.0000 mg | ORAL_TABLET | Freq: Every day | ORAL | Status: DC
  Administered 2023-09-21 – 2023-09-22 (×2): 20 mg via ORAL
  Filled 2023-09-20 (×4): qty 1

## 2023-09-20 MED ORDER — HYDROXYCHLOROQUINE SULFATE 200 MG PO TABS
200.0000 mg | ORAL_TABLET | Freq: Two times a day (BID) | ORAL | Status: DC
  Administered 2023-09-20 – 2023-09-22 (×4): 200 mg via ORAL
  Filled 2023-09-20 (×4): qty 1

## 2023-09-20 NOTE — Progress Notes (Addendum)
 PROGRESS NOTE  Mary Moss VHQ:[id number] DOB: September 22, 1935 DOA: 09/15/2023 PCP: Adrian Prince, MD  Brief History:  88 year old female with history of hypertension, GERD/PUD, CKD stage III, seropositive rheumatoid arthritis, hyperlipidemia, impaired glucose tolerance, and vitamin D deficiency presenting with 5-day history of generalized weakness, nonproductive cough, myalgias, arthralgias, and fevers and chills.  The patient progressively felt worse over the last 1 to 2 days.  She had some shortness of breath.  She complained of some loose stools without any hematochezia or melena.  She denies any chest pain or abdominal pain.  She denies any hemoptysis.  She denies any headache or neck pain.  She has some nausea without any emesis.  Her appetite has been poor. Patient endorses previous history of tobacco.  She quit 16 years ago after approximately 15-20-pack-year history. In the ED, the patient had a temperature up to 100.7 F.  She was hemodynamically stable with oxygen saturation 85% on room air.  She was placed on 2 L with saturation 92-94%. WBC 6.8, hemoglobin 14.8, platelets 168.  Sodium 135, potassium 3.8, bicarbonate 24, serum creatinine 0.94.  LFTs were unremarkable.  The patient was given a 500 cc normal saline bolus in the ED.  The patient was offered oseltamavir, but she deferred expressing concern regarding side effects.  While in the emergency department, the patient developed atrial fibrillation with RVR.  She was started on diltiazem drip.  She converted back to sinus later in the day.  She was transitioned to po metoprolol.  Cardiology was consulted to assist with management. She was started on apixaban without complications.    Assessment/Plan: Acute respiratory failure with hypoxia -Secondary to influenza -Initially placed on 4 L [place] -Wean oxygen for saturation greater 90%   Influenza pneumonitis -Patient deferred taking oseltamavir after discussion of the  risks, benefits, and alternatives -Continue supportive care -continue xopenex -add atrovent -continue pulmicort -continue brovana -2/24 CXR--personally reviewed-bibasilar opacities; chronic interstitial markings   New onset atrial fibrillation with RVR, type unspecified -Started diltiazem drip>>spontaneous conversion to sinus -transitioned to po metoprolol -appreciate cardiology -09/17/23 Echo-EF 55-60%%, no WMA, normal RVF -TSH--1.064 -Optimize electrolytes -CHADSVASc = 4 (age x 2, female, HTN) -initially expresses concern regarding anticoagulation initially but ultimately agreed (01/21/22 EGD--nonbleeding gastric ulcers--attributed to NSAID induced) and pt was on xarelto for DVT prophylaxis at the time -appreciate cardiology -IV heparin>>apixaban -Hgb remains stable on apixaban   GERD -hx of PUD -continue pantoprazole   Seropositive RA -receives golinumab every 2 months--last dose 08/31/23 -restart leflunomide and hydroxychloroquine   Essential HTN -holding lisinopril and hydrochlorothiazide to allow BP margin for rate control of Afib -now on metoprolol tartrate -BP controlled   Gout -restart allopurinol once med rec is verified   Thrombocytopenia -due to viral infection -monitor CBC  Constipation -add miralax and senna                 Family Communication: stepdaughter updated 09/19/23   Consultants:  cardiology   Code Status:  FULL    DVT Prophylaxis:  apixaban     Procedures: As Listed in Progress Note Above   Antibiotics: None         Subjective: Pt denies f/c, cp, sob .  She has some nausea today.  No emesis.  Denies abd pain, diarrhea  Objective: Vitals:   09/20/23 0805 09/20/23 0810 09/20/23 1404 09/20/23 1504  BP:    (!) 107/57  Pulse:    84  Resp:  16  Temp:    98.1 F (36.7 C)  TempSrc:    Oral  SpO2: 94% 94% 91% 92%  Weight:      Height:        Intake/Output Summary (Last 24 hours) at 09/20/2023 1723 Last data filed at  09/20/2023 4098 Gross per 24 hour  Intake 240 ml  Output --  Net 240 ml   Weight change:  Exam:  General:  Pt is alert, follows commands appropriately, not in acute distress HEENT: No icterus, No thrush, No neck mass, [place]/AT Cardiovascular: RRR, S1/S2, no rubs, no gallops Respiratory: bilateral rales.  No wheeze Abdomen: Soft/+BS, non tender, non distended, no guarding Extremities: No edema, No lymphangitis, No petechiae, No rashes, no synovitis   Data Reviewed: I have personally reviewed following labs and imaging studies Basic Metabolic Panel: Recent Labs  Lab 09/15/23 1740 09/16/23 0439 09/17/23 0535 09/20/23 0504  NA 135 136 134* 134*  K 3.8 3.5 4.1 3.7  CL 97* 99 101 101  CO2 24 26 25 24   GLUCOSE 108* 93 95 113*  BUN 19 21 19  25*  CREATININE 0.94 0.97 0.88 1.02*  CALCIUM 8.8* 8.2* 8.3* 8.3*  MG  --  1.9 2.1 2.0  PHOS  --  3.4  --   --    Liver Function Tests: Recent Labs  Lab 09/15/23 1740 09/16/23 0439  AST 34 30  ALT 24 20  ALKPHOS 76 63  BILITOT 1.1 1.1  PROT 7.1 6.2*  ALBUMIN 3.5 3.0*   No results for input(s): "LIPASE", "AMYLASE" in the last 168 hours. No results for input(s): "AMMONIA" in the last 168 hours. Coagulation Profile: No results for input(s): "INR", "PROTIME" in the last 168 hours. CBC: Recent Labs  Lab 09/15/23 1740 09/16/23 0439 09/17/23 0535 09/18/23 0400 09/19/23 0409 09/20/23 0504  WBC 6.8 6.7 3.6* 3.7* 4.2 4.8  NEUTROABS 5.5  --   --   --   --   --   HGB 14.8 14.2 13.4 13.2 13.1 13.7  HCT 45.9 45.4 40.9 39.8 40.5 42.9  MCV 94.4 96.6 94.7 94.1 94.0 94.5  PLT 164 143* 149* 143* 169 165   Cardiac Enzymes: No results for input(s): "CKTOTAL", "CKMB", "CKMBINDEX", "TROPONINI" in the last 168 hours. BNP: Invalid input(s): "POCBNP" CBG: No results for input(s): "GLUCAP" in the last 168 hours. HbA1C: No results for input(s): "HGBA1C" in the last 72 hours. Urine analysis:    Component Value Date/Time   COLORURINE YELLOW  11/16/2019 1503   APPEARANCEUR HAZY (A) 11/16/2019 1503   LABSPEC 1.020 11/16/2019 1503   PHURINE 5.0 11/16/2019 1503   GLUCOSEU NEGATIVE 11/16/2019 1503   HGBUR NEGATIVE 11/16/2019 1503   BILIRUBINUR NEGATIVE 11/16/2019 1503   BILIRUBINUR neg 07/03/2015 0846   KETONESUR NEGATIVE 11/16/2019 1503   PROTEINUR NEGATIVE 11/16/2019 1503   UROBILINOGEN negative 07/03/2015 0846   NITRITE NEGATIVE 11/16/2019 1503   LEUKOCYTESUR TRACE (A) 11/16/2019 1503   Sepsis Labs: @LABRCNTIP (procalcitonin:4,lacticidven:4) ) Recent Results (from the past 240 hours)  Resp panel by RT-PCR (RSV, Flu A&B, Covid) Anterior Nasal Swab     Status: Abnormal   Collection Time: 09/15/23  4:55 PM   Specimen: Anterior Nasal Swab  Result Value Ref Range Status   SARS Coronavirus 2 by RT PCR NEGATIVE NEGATIVE Final    Comment: (NOTE) SARS-CoV-2 target nucleic acids are NOT DETECTED.  The SARS-CoV-2 RNA is generally detectable in upper respiratory specimens during the acute phase of infection. The lowest concentration of SARS-CoV-2 viral  copies this assay can detect is 138 copies/mL. A negative result does not preclude SARS-Cov-2 infection and should not be used as the sole basis for treatment or other patient management decisions. A negative result may occur with  improper specimen collection/handling, submission of specimen other than nasopharyngeal swab, presence of viral mutation(s) within the areas targeted by this assay, and inadequate number of viral copies(<138 copies/mL). A negative result must be combined with clinical observations, patient history, and epidemiological information. The expected result is Negative.  Fact Sheet for Patients:  BloggerCourse.com  Fact Sheet for Healthcare Providers:  SeriousBroker.it  This test is no t yet approved or cleared by the [place] FDA and  has been authorized for detection and/or diagnosis of SARS-CoV-2  by FDA under an Emergency Use Authorization (EUA). This EUA will remain  in effect (meaning this test can be used) for the duration of the COVID-19 declaration under Section 564(b)(1) of the Act, 21 U.S.C.section 360bbb-3(b)(1), unless the authorization is terminated  or revoked sooner.       Influenza A by PCR POSITIVE (A) NEGATIVE Final   Influenza B by PCR NEGATIVE NEGATIVE Final    Comment: (NOTE) The Xpert Xpress SARS-CoV-2/FLU/RSV plus assay is intended as an aid in the diagnosis of influenza from Nasopharyngeal swab specimens and should not be used as a sole basis for treatment. Nasal washings and aspirates are unacceptable for Xpert Xpress SARS-CoV-2/FLU/RSV testing.  Fact Sheet for Patients: BloggerCourse.com  Fact Sheet for Healthcare Providers: SeriousBroker.it  This test is not yet approved or cleared by the [place] FDA and has been authorized for detection and/or diagnosis of SARS-CoV-2 by FDA under an Emergency Use Authorization (EUA). This EUA will remain in effect (meaning this test can be used) for the duration of the COVID-19 declaration under Section 564(b)(1) of the Act, 21 U.S.C. section 360bbb-3(b)(1), unless the authorization is terminated or revoked.     Resp Syncytial Virus by PCR NEGATIVE NEGATIVE Final    Comment: (NOTE) Fact Sheet for Patients: BloggerCourse.com  Fact Sheet for Healthcare Providers: SeriousBroker.it  This test is not yet approved or cleared by the [place] FDA and has been authorized for detection and/or diagnosis of SARS-CoV-2 by FDA under an Emergency Use Authorization (EUA). This EUA will remain in effect (meaning this test can be used) for the duration of the COVID-19 declaration under Section 564(b)(1) of the Act, 21 U.S.C. section 360bbb-3(b)(1), unless the authorization is terminated or revoked.  Performed at  [facility], [address] [place], [place] [address]   MRSA Next Gen by PCR, Nasal     Status: None   Collection Time: 09/16/23 10:54 AM   Specimen: Nasal Mucosa; Nasal Swab  Result Value Ref Range Status   MRSA by PCR Next Gen NOT DETECTED NOT DETECTED Final    Comment: (NOTE) The GeneXpert MRSA Assay (FDA approved for NASAL specimens only), is one component of a comprehensive MRSA colonization surveillance program. It is not intended to diagnose MRSA infection nor to guide or monitor treatment for MRSA infections. Test performance is not FDA approved in patients less than 62 years old. Performed at [facility], [address], [place], [place] [address]   Culture, blood (Routine X 2) w Reflex to ID Panel     Status: None (Preliminary result)   Collection Time: 09/16/23  6:00 PM   Specimen: BLOOD  Result Value Ref Range Status   Specimen Description   Final    BLOOD BLOOD LEFT HAND Performed  at [facility], [address], [place], [place] [address]    Special Requests   Final    BOTTLES DRAWN AEROBIC ONLY Blood Culture results may not be optimal due to an inadequate volume of blood received in culture bottles Performed at [facility], [address] [place], [place] [address]    Culture   Final    NO GROWTH 4 DAYS Performed at [facility], [address] [place], [place] [address]    Report Status PENDING  Incomplete  Culture, blood (Routine X 2) w Reflex to ID Panel     Status: None (Preliminary result)   Collection Time: 09/16/23  6:06 PM   Specimen: BLOOD LEFT FOREARM  Result Value Ref Range Status   Specimen Description   Final    BLOOD LEFT FOREARM BOTTLES DRAWN AEROBIC AND ANAEROBIC Performed at [facility], [address], [place], [place] [address]    Special Requests   Final    Blood Culture results may not be optimal due to an inadequate volume of blood received in culture bottles Performed at [facility], [address] [place], [place] [address]     Culture   Final    NO GROWTH 4 DAYS Performed at [facility], [address] [place], [place] [address]    Report Status PENDING  Incomplete     Scheduled Meds:  apixaban  5 mg Oral BID   arformoterol  15 mcg Nebulization BID   budesonide (PULMICORT) nebulizer solution  0.5 mg Nebulization BID   Chlorhexidine Gluconate Cloth  6 each Topical Q0600   dextromethorphan-guaiFENesin  1 tablet Oral BID   hydrocortisone cream   Topical BID   hydroxychloroquine  200 mg Oral BID   ipratropium  0.5 mg Nebulization Q8H   [START ON 09/21/2023] leflunomide  20 mg Oral Daily   levalbuterol  0.63 mg Nebulization TID   metoprolol tartrate  37.5 mg Oral BID   pantoprazole  40 mg Oral BID   polyethylene glycol  17 g Oral Daily   senna  2 tablet Oral Daily   Continuous Infusions:  Procedures/Studies: DG CHEST PORT 1 VIEW Result Date: 09/20/2023 CLINICAL DATA:  Influenza.  Acute respiratory failure with hypoxia. EXAM: PORTABLE CHEST 1 VIEW COMPARISON:  Radiographs 09/15/2023 and 10/08/2021. FINDINGS: The heart size and mediastinal contours are stable with aortic atherosclerosis. There is chronic central airway thickening with patchy bibasilar opacities, similar to recent prior studies and mildly increased from 2023. No edema, significant pleural effusion or pneumothorax. The bones appear unchanged. Patient is status post right shoulder reverse arthroplasty. Telemetry leads overlie the chest. IMPRESSION: Chronic central airway thickening with patchy bibasilar opacities, similar to recent prior studies and mildly increased from 2023. Findings may reflect chronic atelectasis, scarring or infection. No confluent airspace disease. Electronically Signed   By: Carey Bullocks M.D.   On: 09/20/2023 13:18   ECHOCARDIOGRAM COMPLETE Result Date: 09/17/2023    ECHOCARDIOGRAM REPORT   Patient Name:   KAMERA DUBAS Date of Exam: 09/17/2023 Medical Rec #:  [id number]         Height:       68.0 in Accession #:     [id number]        Weight:       211.9 lb Date of Birth:  Nov 10, 1935        BSA:          2.094 m Patient Age:    63 years  BP:           114/68 mmHg Patient Gender: F                 HR:           78 bpm. [facility] Procedure: 2D Echo, Cardiac Doppler and Color Doppler (Both Spectral and Color            Flow Doppler were utilized during procedure). Indications:    I48.91* Unspeicified atrial fibrillation  History:        Patient has no prior history of Echocardiogram examinations.                 Signs/Symptoms:Current on this admission-FLU A POSITIVE; Risk                 Factors:Hypertension and Dyslipidemia.  Sonographer:    Dominica Severin RCS, RVS Referring Phys: [phone] Verenise Moulin  Sonographer Comments: Global longitudinal strain was attempted. IMPRESSIONS  1. Left ventricular ejection fraction, by estimation, is 55 to 60%. The left ventricle has normal function. The left ventricle has no regional wall motion abnormalities. There is mild left ventricular hypertrophy. Left ventricular diastolic parameters were normal. The global longitudinal strain is indeterminate.  2. Right ventricular systolic function is normal. The right ventricular size is normal. Tricuspid regurgitation signal is inadequate for assessing PA pressure.  3. The mitral valve is normal in structure. No evidence of mitral valve regurgitation. No evidence of mitral stenosis.  4. The tricuspid valve is abnormal.  5. The aortic valve was not well visualized. There is mild calcification of the aortic valve. There is mild thickening of the aortic valve. Aortic valve regurgitation is not visualized. No aortic stenosis is present.  6. The inferior vena cava is normal in size with greater than 50% respiratory variability, suggesting right atrial pressure of 3 mmHg. Comparison(s): No prior Echocardiogram. FINDINGS  Left Ventricle: Left ventricular ejection fraction, by estimation, is 55 to 60%. The left ventricle has normal  function. The left ventricle has no regional wall motion abnormalities. Strain was performed and the global longitudinal strain is indeterminate. The left ventricular internal cavity size was normal in size. There is mild left ventricular hypertrophy. Left ventricular diastolic parameters were normal. Right Ventricle: The right ventricular size is normal. Right vetricular wall thickness was not well visualized. Right ventricular systolic function is normal. Tricuspid regurgitation signal is inadequate for assessing PA pressure. Left Atrium: Left atrial size was normal in size. Right Atrium: Right atrial size was normal in size. Pericardium: There is no evidence of pericardial effusion. Mitral Valve: The mitral valve is normal in structure. There is mild thickening of the mitral valve leaflet(s). There is mild calcification of the mitral valve leaflet(s). Mild mitral annular calcification. No evidence of mitral valve regurgitation. No evidence of mitral valve stenosis. MV peak gradient, 4.8 mmHg. The mean mitral valve gradient is 2.0 mmHg. Tricuspid Valve: The tricuspid valve is abnormal. Tricuspid valve regurgitation is mild . No evidence of tricuspid stenosis. Aortic Valve: The aortic valve was not well visualized. There is mild calcification of the aortic valve. There is mild thickening of the aortic valve. There is mild aortic valve annular calcification. Aortic valve regurgitation is not visualized. No aortic stenosis is present. Aortic valve mean gradient measures 6.0 mmHg. Aortic valve peak gradient measures 11.0 mmHg. Aortic valve area, by VTI measures 2.21 cm. Pulmonic Valve: The pulmonic valve was not well visualized. Pulmonic valve regurgitation is not visualized.  No evidence of pulmonic stenosis. Aorta: The aortic root and ascending aorta are structurally normal, with no evidence of dilitation. Venous: The inferior vena cava is normal in size with greater than 50% respiratory variability, suggesting  right atrial pressure of 3 mmHg. IAS/Shunts: No atrial level shunt detected by color flow Doppler. Additional Comments: 3D imaging was not performed.  LEFT VENTRICLE PLAX 2D LVIDd:         4.30 cm     Diastology LVIDs:         3.00 cm     LV e' medial:    8.16 cm/s LV PW:         1.10 cm     LV E/e' medial:  11.6 LV IVS:        1.10 cm     LV e' lateral:   10.20 cm/s LVOT diam:     2.00 cm     LV E/e' lateral: 9.2 LV SV:         70 LV SV Index:   33 LVOT Area:     3.14 cm  LV Volumes (MOD) LV vol d, MOD A2C: 63.7 ml LV vol d, MOD A4C: 50.8 ml LV vol s, MOD A2C: 24.1 ml LV vol s, MOD A4C: 21.8 ml LV SV MOD A2C:     39.6 ml LV SV MOD A4C:     50.8 ml LV SV MOD BP:      34.6 ml RIGHT VENTRICLE RV Basal diam:  3.40 cm RV Mid diam:    3.40 cm RV S prime:     12.30 cm/s TAPSE (M-mode): 3.2 cm LEFT ATRIUM             Index        RIGHT ATRIUM           Index LA diam:        3.20 cm 1.53 cm/m   RA Area:     13.20 cm LA Vol (A2C):   31.8 ml 15.18 ml/m  RA Volume:   24.40 ml  11.65 ml/m LA Vol (A4C):   36.1 ml 17.24 ml/m LA Biplane Vol: 36.4 ml 17.38 ml/m  AORTIC VALVE                     PULMONIC VALVE AV Area (Vmax):    2.12 cm      PV Vmax:       0.77 m/s AV Area (Vmean):   2.01 cm      PV Peak grad:  2.4 mmHg AV Area (VTI):     2.21 cm AV Vmax:           166.00 cm/s AV Vmean:          120.000 cm/s AV VTI:            0.317 m AV Peak Grad:      11.0 mmHg AV Mean Grad:      6.0 mmHg LVOT Vmax:         112.00 cm/s LVOT Vmean:        76.900 cm/s LVOT VTI:          0.223 m LVOT/AV VTI ratio: 0.70  AORTA Ao Root diam: 2.90 cm Ao Asc diam:  2.90 cm MITRAL VALVE MV Area (PHT): 4.80 cm     SHUNTS MV Area VTI:   2.31 cm     Systemic VTI:  0.22 m MV Peak grad:  4.8 mmHg  Systemic Diam: 2.00 cm MV Mean grad:  2.0 mmHg MV Vmax:       1.09 m/s MV Vmean:      69.6 cm/s MV Decel Time: 158 msec MV E velocity: 94.30 cm/s MV A velocity: 108.00 cm/s MV E/A ratio:  0.87 Dina Rich MD Electronically signed by Dina Rich  MD Signature Date/Time: 09/17/2023/12:21:51 PM    Final    DG Chest 2 View Result Date: 09/15/2023 CLINICAL DATA:  Shortness of breath patient reports flu like symptoms. Hypoxia. EXAM: CHEST - 2 VIEW COMPARISON:  10/08/2021 FINDINGS: Chronic hyperinflation. Mild bronchial thickening. Ill-defined opacity in the lung bases suggestive of atelectasis and possible trace effusions. Stable heart size and mediastinal contours. Aortic atherosclerosis. No pulmonary edema. No pneumothorax. Reverse right shoulder arthroplasty. IMPRESSION: 1. Chronic hyperinflation with mild bronchial thickening. 2. Ill-defined opacity in the lung bases suggestive of atelectasis and possible trace effusions. Electronically Signed   By: Narda Rutherford M.D.   On: 09/15/2023 17:25    Catarina Hartshorn, DO  [facility]  If 7PM-7AM, please contact night-coverage [email or web address] Password [facility] 09/20/2023, 5:23 PM   LOS: 5 days

## 2023-09-21 DIAGNOSIS — R6510 Systemic inflammatory response syndrome (SIRS) of non-infectious origin without acute organ dysfunction: Secondary | ICD-10-CM | POA: Diagnosis not present

## 2023-09-21 DIAGNOSIS — I480 Paroxysmal atrial fibrillation: Secondary | ICD-10-CM | POA: Diagnosis not present

## 2023-09-21 DIAGNOSIS — J9601 Acute respiratory failure with hypoxia: Secondary | ICD-10-CM | POA: Diagnosis not present

## 2023-09-21 DIAGNOSIS — J101 Influenza due to other identified influenza virus with other respiratory manifestations: Secondary | ICD-10-CM | POA: Diagnosis not present

## 2023-09-21 LAB — CBC
HCT: 39.1 % (ref 36.0–46.0)
Hemoglobin: 12.4 g/dL (ref 12.0–15.0)
MCH: 29.7 pg (ref 26.0–34.0)
MCHC: 31.7 g/dL (ref 30.0–36.0)
MCV: 93.8 fL (ref 80.0–100.0)
Platelets: 191 10*3/uL (ref 150–400)
RBC: 4.17 MIL/uL (ref 3.87–5.11)
RDW: 12.5 % (ref 11.5–15.5)
WBC: 4.9 10*3/uL (ref 4.0–10.5)
nRBC: 0 % (ref 0.0–0.2)

## 2023-09-21 LAB — BASIC METABOLIC PANEL
Anion gap: 9 (ref 5–15)
BUN: 22 mg/dL (ref 8–23)
CO2: 26 mmol/L (ref 22–32)
Calcium: 8.1 mg/dL — ABNORMAL LOW (ref 8.9–10.3)
Chloride: 100 mmol/L (ref 98–111)
Creatinine, Ser: 0.9 mg/dL (ref 0.44–1.00)
GFR, Estimated: >60 mL/min (ref >60)
Glucose, Bld: 114 mg/dL — ABNORMAL HIGH (ref 70–99)
Potassium: 3.7 mmol/L (ref 3.5–5.1)
Sodium: 135 mmol/L (ref 135–145)

## 2023-09-21 LAB — CULTURE, BLOOD (ROUTINE X 2)
Culture: NO GROWTH
Culture: NO GROWTH

## 2023-09-21 LAB — PROCALCITONIN: Procalcitonin: <0.1 ng/mL

## 2023-09-21 NOTE — Discharge Instructions (Signed)
Information on my medicine - ELIQUIS (apixaban)  This medication education was reviewed with me or my healthcare representative as part of my discharge preparation.  TWhy was Eliquis prescribed for you? Eliquis was prescribed for you to reduce the risk of a blood clot forming that can cause a stroke if you have a medical condition called atrial fibrillation (a type of irregular heartbeat).  What do You need to know about Eliquis ? Take your Eliquis TWICE DAILY - one tablet in the morning and one tablet in the evening with or without food. If you have difficulty swallowing the tablet whole please discuss with your pharmacist how to take the medication safely.  Take Eliquis exactly as prescribed by your doctor and DO NOT stop taking Eliquis without talking to the doctor who prescribed the medication.  Stopping may increase your risk of developing a stroke.  Refill your prescription before you run out.  After discharge, you should have regular check-up appointments with your healthcare provider that is prescribing your Eliquis.  In the future your dose may need to be changed if your kidney function or weight changes by a significant amount or as you get older.  What do you do if you miss a dose? If you miss a dose, take it as soon as you remember on the same day and resume taking twice daily.  Do not take more than one dose of ELIQUIS at the same time to make up a missed dose.  Important Safety Information A possible side effect of Eliquis is bleeding. You should call your healthcare provider right away if you experience any of the following: Bleeding from an injury or your nose that does not stop. Unusual colored urine (red or dark brown) or unusual colored stools (red or black). Unusual bruising for unknown reasons. A serious fall or if you hit your head (even if there is no bleeding).  Some medicines may interact with Eliquis and might increase your risk of bleeding or clotting while  on Eliquis. To help avoid this, consult your healthcare provider or pharmacist prior to using any new prescription or non-prescription medications, including herbals, vitamins, non-steroidal anti-inflammatory drugs (NSAIDs) and supplements.  This website has more information on Eliquis (apixaban): http://www.eliquis.com/eliquis/home  

## 2023-09-21 NOTE — Plan of Care (Signed)

## 2023-09-21 NOTE — Progress Notes (Signed)
 PROGRESS NOTE  Mary Moss RUE:[id number] DOB: 03-13-1936 DOA: 09/15/2023 PCP: Adrian Prince, MD  Brief History:  88 year old female with history of hypertension, GERD/PUD, CKD stage III, seropositive rheumatoid arthritis, hyperlipidemia, impaired glucose tolerance, and vitamin D deficiency presenting with 5-day history of generalized weakness, nonproductive cough, myalgias, arthralgias, and fevers and chills.  The patient progressively felt worse over the last 1 to 2 days.  She had some shortness of breath.  She complained of some loose stools without any hematochezia or melena.  She denies any chest pain or abdominal pain.  She denies any hemoptysis.  She denies any headache or neck pain.  She has some nausea without any emesis.  Her appetite has been poor. Patient endorses previous history of tobacco.  She quit 16 years ago after approximately 15-20-pack-year history. In the ED, the patient had a temperature up to 100.7 F.  She was hemodynamically stable with oxygen saturation 85% on room air.  She was placed on 2 L with saturation 92-94%. WBC 6.8, hemoglobin 14.8, platelets 168.  Sodium 135, potassium 3.8, bicarbonate 24, serum creatinine 0.94.  LFTs were unremarkable.  The patient was given a 500 cc normal saline bolus in the ED.  The patient was offered oseltamavir, but she deferred expressing concern regarding side effects.  While in the emergency department, the patient developed atrial fibrillation with RVR.  She was started on diltiazem drip.  She converted back to sinus later in the day.  She was transitioned to po metoprolol.  Cardiology was consulted to assist with management. She was started on apixaban without complications.   Assessment/Plan:  Acute respiratory failure with hypoxia -Secondary to influenza -Initially placed on 4 L [place]>>2L -Wean oxygen for saturation greater 90%   Influenza pneumonitis -Patient deferred taking oseltamavir after discussion of the  risks, benefits, and alternatives -Continue supportive care -continue xopenex -added atrovent -continue pulmicort -continue brovana -2/24 CXR--personally reviewed-bibasilar opacities; chronic interstitial markings   New onset atrial fibrillation with RVR, type unspecified -Started diltiazem drip>>spontaneous conversion to sinus -transitioned to po metoprolol -appreciate cardiology -09/17/23 Echo-EF 55-60%%, no WMA, normal RVF -TSH--1.064 -Optimize electrolytes -CHADSVASc = 4 (age x 2, female, HTN) -initially expresses concern regarding anticoagulation initially but ultimately agreed (01/21/22 EGD--nonbleeding gastric ulcers--attributed to NSAID induced) and pt was on xarelto for DVT prophylaxis at the time -appreciate cardiology -IV heparin>>apixaban -Hgb remains stable on apixaban   GERD -hx of PUD -continue pantoprazole   Seropositive RA -receives golinumab every 2 months--last dose 08/31/23 -restart leflunomide and hydroxychloroquine   Essential HTN -holding lisinopril and hydrochlorothiazide to allow BP margin for rate control of Afib -now on metoprolol tartrate -BP controlled   Gout -restart allopurinol once med rec is verified   Thrombocytopenia -due to viral infection -monitor CBC -improved   Constipation -add miralax and senna                 Family Communication: stepdaughter updated 09/20/23   Consultants:  cardiology   Code Status:  FULL    DVT Prophylaxis:  apixaban     Procedures: As Listed in Progress Note Above   Antibiotics: None         Subjective: Patient denies fevers, chills, headache, chest pain, dyspnea, nausea, vomiting, diarrhea, abdominal pain, dysuria, hematuria, hematochezia, and melena.   Objective: Vitals:   09/20/23 2034 09/21/23 0431 09/21/23 0823 09/21/23 1337  BP:  108/72  (!) 110/55  Pulse:  79  79  Resp:  18  16  Temp:  97.8 F (36.6 C)  (!) 97.4 F (36.3 C)  TempSrc:  Oral  Oral  SpO2: 94% 96% 93%  93%  Weight:      Height:        Intake/Output Summary (Last 24 hours) at 09/21/2023 1800 Last data filed at 09/21/2023 1300 Gross per 24 hour  Intake 480 ml  Output --  Net 480 ml   Weight change:  Exam:  General:  Pt is alert, follows commands appropriately, not in acute distress HEENT: No icterus, No thrush, No neck mass, [place]/AT Cardiovascular: RRR, S1/S2, no rubs, no gallops Respiratory: bibasilar crackles.  No wheeze Abdomen: Soft/+BS, non tender, non distended, no guarding Extremities: No edema, No lymphangitis, No petechiae, No rashes, no synovitis   Data Reviewed: I have personally reviewed following labs and imaging studies Basic Metabolic Panel: Recent Labs  Lab 09/15/23 1740 09/16/23 0439 09/17/23 0535 09/20/23 0504 09/21/23 0440  NA 135 136 134* 134* 135  K 3.8 3.5 4.1 3.7 3.7  CL 97* 99 101 101 100  CO2 24 26 25 24 26   GLUCOSE 108* 93 95 113* 114*  BUN 19 21 19  25* 22  CREATININE 0.94 0.97 0.88 1.02* 0.90  CALCIUM 8.8* 8.2* 8.3* 8.3* 8.1*  MG  --  1.9 2.1 2.0  --   PHOS  --  3.4  --   --   --    Liver Function Tests: Recent Labs  Lab 09/15/23 1740 09/16/23 0439  AST 34 30  ALT 24 20  ALKPHOS 76 63  BILITOT 1.1 1.1  PROT 7.1 6.2*  ALBUMIN 3.5 3.0*   No results for input(s): "LIPASE", "AMYLASE" in the last 168 hours. No results for input(s): "AMMONIA" in the last 168 hours. Coagulation Profile: No results for input(s): "INR", "PROTIME" in the last 168 hours. CBC: Recent Labs  Lab 09/15/23 1740 09/16/23 0439 09/17/23 0535 09/18/23 0400 09/19/23 0409 09/20/23 0504 09/21/23 0440  WBC 6.8   < > 3.6* 3.7* 4.2 4.8 4.9  NEUTROABS 5.5  --   --   --   --   --   --   HGB 14.8   < > 13.4 13.2 13.1 13.7 12.4  HCT 45.9   < > 40.9 39.8 40.5 42.9 39.1  MCV 94.4   < > 94.7 94.1 94.0 94.5 93.8  PLT 164   < > 149* 143* 169 165 191   < > = values in this interval not displayed.   Cardiac Enzymes: No results for input(s): "CKTOTAL", "CKMB",  "CKMBINDEX", "TROPONINI" in the last 168 hours. BNP: Invalid input(s): "POCBNP" CBG: No results for input(s): "GLUCAP" in the last 168 hours. HbA1C: No results for input(s): "HGBA1C" in the last 72 hours. Urine analysis:    Component Value Date/Time   COLORURINE YELLOW 11/16/2019 1503   APPEARANCEUR HAZY (A) 11/16/2019 1503   LABSPEC 1.020 11/16/2019 1503   PHURINE 5.0 11/16/2019 1503   GLUCOSEU NEGATIVE 11/16/2019 1503   HGBUR NEGATIVE 11/16/2019 1503   BILIRUBINUR NEGATIVE 11/16/2019 1503   BILIRUBINUR neg 07/03/2015 0846   KETONESUR NEGATIVE 11/16/2019 1503   PROTEINUR NEGATIVE 11/16/2019 1503   UROBILINOGEN negative 07/03/2015 0846   NITRITE NEGATIVE 11/16/2019 1503   LEUKOCYTESUR TRACE (A) 11/16/2019 1503   Sepsis Labs: @LABRCNTIP (procalcitonin:4,lacticidven:4) ) Recent Results (from the past 240 hours)  Resp panel by RT-PCR (RSV, Flu A&B, Covid) Anterior Nasal Swab     Status: Abnormal   Collection Time: 09/15/23  4:55 PM  Specimen: Anterior Nasal Swab  Result Value Ref Range Status   SARS Coronavirus 2 by RT PCR NEGATIVE NEGATIVE Final    Comment: (NOTE) SARS-CoV-2 target nucleic acids are NOT DETECTED.  The SARS-CoV-2 RNA is generally detectable in upper respiratory specimens during the acute phase of infection. The lowest concentration of SARS-CoV-2 viral copies this assay can detect is 138 copies/mL. A negative result does not preclude SARS-Cov-2 infection and should not be used as the sole basis for treatment or other patient management decisions. A negative result may occur with  improper specimen collection/handling, submission of specimen other than nasopharyngeal swab, presence of viral mutation(s) within the areas targeted by this assay, and inadequate number of viral copies(<138 copies/mL). A negative result must be combined with clinical observations, patient history, and epidemiological information. The expected result is Negative.  Fact Sheet for  Patients:  BloggerCourse.com  Fact Sheet for Healthcare Providers:  SeriousBroker.it  This test is no t yet approved or cleared by the [place] FDA and  has been authorized for detection and/or diagnosis of SARS-CoV-2 by FDA under an Emergency Use Authorization (EUA). This EUA will remain  in effect (meaning this test can be used) for the duration of the COVID-19 declaration under Section 564(b)(1) of the Act, 21 U.S.C.section 360bbb-3(b)(1), unless the authorization is terminated  or revoked sooner.       Influenza A by PCR POSITIVE (A) NEGATIVE Final   Influenza B by PCR NEGATIVE NEGATIVE Final    Comment: (NOTE) The Xpert Xpress SARS-CoV-2/FLU/RSV plus assay is intended as an aid in the diagnosis of influenza from Nasopharyngeal swab specimens and should not be used as a sole basis for treatment. Nasal washings and aspirates are unacceptable for Xpert Xpress SARS-CoV-2/FLU/RSV testing.  Fact Sheet for Patients: BloggerCourse.com  Fact Sheet for Healthcare Providers: SeriousBroker.it  This test is not yet approved or cleared by the [place] FDA and has been authorized for detection and/or diagnosis of SARS-CoV-2 by FDA under an Emergency Use Authorization (EUA). This EUA will remain in effect (meaning this test can be used) for the duration of the COVID-19 declaration under Section 564(b)(1) of the Act, 21 U.S.C. section 360bbb-3(b)(1), unless the authorization is terminated or revoked.     Resp Syncytial Virus by PCR NEGATIVE NEGATIVE Final    Comment: (NOTE) Fact Sheet for Patients: BloggerCourse.com  Fact Sheet for Healthcare Providers: SeriousBroker.it  This test is not yet approved or cleared by the [place] FDA and has been authorized for detection and/or diagnosis of SARS-CoV-2 by FDA under an  Emergency Use Authorization (EUA). This EUA will remain in effect (meaning this test can be used) for the duration of the COVID-19 declaration under Section 564(b)(1) of the Act, 21 U.S.C. section 360bbb-3(b)(1), unless the authorization is terminated or revoked.  Performed at [facility], [address] [place], [place] [address]   MRSA Next Gen by PCR, Nasal     Status: None   Collection Time: 09/16/23 10:54 AM   Specimen: Nasal Mucosa; Nasal Swab  Result Value Ref Range Status   MRSA by PCR Next Gen NOT DETECTED NOT DETECTED Final    Comment: (NOTE) The GeneXpert MRSA Assay (FDA approved for NASAL specimens only), is one component of a comprehensive MRSA colonization surveillance program. It is not intended to diagnose MRSA infection nor to guide or monitor treatment for MRSA infections. Test performance is not FDA approved in patients less than 89 years old. Performed at [facility], [address],  [place], [place] [address]   Culture, blood (Routine X 2) w Reflex to ID Panel     Status: None   Collection Time: 09/16/23  6:00 PM   Specimen: BLOOD  Result Value Ref Range Status   Specimen Description BLOOD BLOOD LEFT HAND  Final   Special Requests   Final    BOTTLES DRAWN AEROBIC ONLY Blood Culture results may not be optimal due to an inadequate volume of blood received in culture bottles   Culture   Final    NO GROWTH 5 DAYS Performed at [facility], [address], [place], [place] [address]    Report Status 09/21/2023 FINAL  Final  Culture, blood (Routine X 2) w Reflex to ID Panel     Status: None   Collection Time: 09/16/23  6:06 PM   Specimen: BLOOD LEFT FOREARM  Result Value Ref Range Status   Specimen Description   Final    BLOOD LEFT FOREARM BOTTLES DRAWN AEROBIC AND ANAEROBIC   Special Requests   Final    Blood Culture results may not be optimal due to an inadequate volume of blood received in culture bottles   Culture   Final    NO GROWTH 5 DAYS Performed at  [facility], [address] [place], [place] [address]    Report Status 09/21/2023 FINAL  Final     Scheduled Meds:  apixaban  5 mg Oral BID   arformoterol  15 mcg Nebulization BID   budesonide (PULMICORT) nebulizer solution  0.5 mg Nebulization BID   Chlorhexidine Gluconate Cloth  6 each Topical Q0600   dextromethorphan-guaiFENesin  1 tablet Oral BID   hydrocortisone cream   Topical BID   hydroxychloroquine  200 mg Oral BID   ipratropium  0.5 mg Nebulization TID   leflunomide  20 mg Oral Daily   levalbuterol  0.63 mg Nebulization TID   metoprolol tartrate  37.5 mg Oral BID   pantoprazole  40 mg Oral BID   polyethylene glycol  17 g Oral Daily   senna  2 tablet Oral Daily   Continuous Infusions:  Procedures/Studies: DG CHEST PORT 1 VIEW Result Date: 09/20/2023 CLINICAL DATA:  Influenza.  Acute respiratory failure with hypoxia. EXAM: PORTABLE CHEST 1 VIEW COMPARISON:  Radiographs 09/15/2023 and 10/08/2021. FINDINGS: The heart size and mediastinal contours are stable with aortic atherosclerosis. There is chronic central airway thickening with patchy bibasilar opacities, similar to recent prior studies and mildly increased from 2023. No edema, significant pleural effusion or pneumothorax. The bones appear unchanged. Patient is status post right shoulder reverse arthroplasty. Telemetry leads overlie the chest. IMPRESSION: Chronic central airway thickening with patchy bibasilar opacities, similar to recent prior studies and mildly increased from 2023. Findings may reflect chronic atelectasis, scarring or infection. No confluent airspace disease. Electronically Signed   By: Carey Bullocks M.D.   On: 09/20/2023 13:18   ECHOCARDIOGRAM COMPLETE Result Date: 09/17/2023    ECHOCARDIOGRAM REPORT   Patient Name:   LYNNANN KNUDSEN Date of Exam: 09/17/2023 Medical Rec #:  [id number]         Height:       68.0 in Accession #:    [id number]        Weight:       211.9 lb Date of Birth:  10/12/1935         BSA:          2.094 m Patient Age:    7 years  BP:           114/68 mmHg Patient Gender: F                 HR:           78 bpm. [facility] Procedure: 2D Echo, Cardiac Doppler and Color Doppler (Both Spectral and Color            Flow Doppler were utilized during procedure). Indications:    I48.91* Unspeicified atrial fibrillation  History:        Patient has no prior history of Echocardiogram examinations.                 Signs/Symptoms:Current on this admission-FLU A POSITIVE; Risk                 Factors:Hypertension and Dyslipidemia.  Sonographer:    Dominica Severin RCS, RVS Referring Phys: [phone] Shye Doty  Sonographer Comments: Global longitudinal strain was attempted. IMPRESSIONS  1. Left ventricular ejection fraction, by estimation, is 55 to 60%. The left ventricle has normal function. The left ventricle has no regional wall motion abnormalities. There is mild left ventricular hypertrophy. Left ventricular diastolic parameters were normal. The global longitudinal strain is indeterminate.  2. Right ventricular systolic function is normal. The right ventricular size is normal. Tricuspid regurgitation signal is inadequate for assessing PA pressure.  3. The mitral valve is normal in structure. No evidence of mitral valve regurgitation. No evidence of mitral stenosis.  4. The tricuspid valve is abnormal.  5. The aortic valve was not well visualized. There is mild calcification of the aortic valve. There is mild thickening of the aortic valve. Aortic valve regurgitation is not visualized. No aortic stenosis is present.  6. The inferior vena cava is normal in size with greater than 50% respiratory variability, suggesting right atrial pressure of 3 mmHg. Comparison(s): No prior Echocardiogram. FINDINGS  Left Ventricle: Left ventricular ejection fraction, by estimation, is 55 to 60%. The left ventricle has normal function. The left ventricle has no regional wall motion abnormalities. Strain was  performed and the global longitudinal strain is indeterminate. The left ventricular internal cavity size was normal in size. There is mild left ventricular hypertrophy. Left ventricular diastolic parameters were normal. Right Ventricle: The right ventricular size is normal. Right vetricular wall thickness was not well visualized. Right ventricular systolic function is normal. Tricuspid regurgitation signal is inadequate for assessing PA pressure. Left Atrium: Left atrial size was normal in size. Right Atrium: Right atrial size was normal in size. Pericardium: There is no evidence of pericardial effusion. Mitral Valve: The mitral valve is normal in structure. There is mild thickening of the mitral valve leaflet(s). There is mild calcification of the mitral valve leaflet(s). Mild mitral annular calcification. No evidence of mitral valve regurgitation. No evidence of mitral valve stenosis. MV peak gradient, 4.8 mmHg. The mean mitral valve gradient is 2.0 mmHg. Tricuspid Valve: The tricuspid valve is abnormal. Tricuspid valve regurgitation is mild . No evidence of tricuspid stenosis. Aortic Valve: The aortic valve was not well visualized. There is mild calcification of the aortic valve. There is mild thickening of the aortic valve. There is mild aortic valve annular calcification. Aortic valve regurgitation is not visualized. No aortic stenosis is present. Aortic valve mean gradient measures 6.0 mmHg. Aortic valve peak gradient measures 11.0 mmHg. Aortic valve area, by VTI measures 2.21 cm. Pulmonic Valve: The pulmonic valve was not well visualized. Pulmonic valve regurgitation is not visualized.  No evidence of pulmonic stenosis. Aorta: The aortic root and ascending aorta are structurally normal, with no evidence of dilitation. Venous: The inferior vena cava is normal in size with greater than 50% respiratory variability, suggesting right atrial pressure of 3 mmHg. IAS/Shunts: No atrial level shunt detected by color  flow Doppler. Additional Comments: 3D imaging was not performed.  LEFT VENTRICLE PLAX 2D LVIDd:         4.30 cm     Diastology LVIDs:         3.00 cm     LV e' medial:    8.16 cm/s LV PW:         1.10 cm     LV E/e' medial:  11.6 LV IVS:        1.10 cm     LV e' lateral:   10.20 cm/s LVOT diam:     2.00 cm     LV E/e' lateral: 9.2 LV SV:         70 LV SV Index:   33 LVOT Area:     3.14 cm  LV Volumes (MOD) LV vol d, MOD A2C: 63.7 ml LV vol d, MOD A4C: 50.8 ml LV vol s, MOD A2C: 24.1 ml LV vol s, MOD A4C: 21.8 ml LV SV MOD A2C:     39.6 ml LV SV MOD A4C:     50.8 ml LV SV MOD BP:      34.6 ml RIGHT VENTRICLE RV Basal diam:  3.40 cm RV Mid diam:    3.40 cm RV S prime:     12.30 cm/s TAPSE (M-mode): 3.2 cm LEFT ATRIUM             Index        RIGHT ATRIUM           Index LA diam:        3.20 cm 1.53 cm/m   RA Area:     13.20 cm LA Vol (A2C):   31.8 ml 15.18 ml/m  RA Volume:   24.40 ml  11.65 ml/m LA Vol (A4C):   36.1 ml 17.24 ml/m LA Biplane Vol: 36.4 ml 17.38 ml/m  AORTIC VALVE                     PULMONIC VALVE AV Area (Vmax):    2.12 cm      PV Vmax:       0.77 m/s AV Area (Vmean):   2.01 cm      PV Peak grad:  2.4 mmHg AV Area (VTI):     2.21 cm AV Vmax:           166.00 cm/s AV Vmean:          120.000 cm/s AV VTI:            0.317 m AV Peak Grad:      11.0 mmHg AV Mean Grad:      6.0 mmHg LVOT Vmax:         112.00 cm/s LVOT Vmean:        76.900 cm/s LVOT VTI:          0.223 m LVOT/AV VTI ratio: 0.70  AORTA Ao Root diam: 2.90 cm Ao Asc diam:  2.90 cm MITRAL VALVE MV Area (PHT): 4.80 cm     SHUNTS MV Area VTI:   2.31 cm     Systemic VTI:  0.22 m MV Peak grad:  4.8 mmHg  Systemic Diam: 2.00 cm MV Mean grad:  2.0 mmHg MV Vmax:       1.09 m/s MV Vmean:      69.6 cm/s MV Decel Time: 158 msec MV E velocity: 94.30 cm/s MV A velocity: 108.00 cm/s MV E/A ratio:  0.87 Dina Rich MD Electronically signed by Dina Rich MD Signature Date/Time: 09/17/2023/12:21:51 PM    Final    DG Chest 2 View Result  Date: 09/15/2023 CLINICAL DATA:  Shortness of breath patient reports flu like symptoms. Hypoxia. EXAM: CHEST - 2 VIEW COMPARISON:  10/08/2021 FINDINGS: Chronic hyperinflation. Mild bronchial thickening. Ill-defined opacity in the lung bases suggestive of atelectasis and possible trace effusions. Stable heart size and mediastinal contours. Aortic atherosclerosis. No pulmonary edema. No pneumothorax. Reverse right shoulder arthroplasty. IMPRESSION: 1. Chronic hyperinflation with mild bronchial thickening. 2. Ill-defined opacity in the lung bases suggestive of atelectasis and possible trace effusions. Electronically Signed   By: Narda Rutherford M.D.   On: 09/15/2023 17:25    Catarina Hartshorn, DO  [facility]  If 7PM-7AM, please contact night-coverage [email or web address] Password [email or web address] 09/21/2023, 6:00 PM   LOS: 6 days

## 2023-09-22 DIAGNOSIS — R1319 Other dysphagia: Secondary | ICD-10-CM | POA: Diagnosis not present

## 2023-09-22 DIAGNOSIS — M6281 Muscle weakness (generalized): Secondary | ICD-10-CM | POA: Diagnosis not present

## 2023-09-22 DIAGNOSIS — M109 Gout, unspecified: Secondary | ICD-10-CM | POA: Diagnosis not present

## 2023-09-22 DIAGNOSIS — J101 Influenza due to other identified influenza virus with other respiratory manifestations: Secondary | ICD-10-CM | POA: Diagnosis not present

## 2023-09-22 DIAGNOSIS — I10 Essential (primary) hypertension: Secondary | ICD-10-CM | POA: Diagnosis not present

## 2023-09-22 DIAGNOSIS — F411 Generalized anxiety disorder: Secondary | ICD-10-CM | POA: Diagnosis not present

## 2023-09-22 DIAGNOSIS — K219 Gastro-esophageal reflux disease without esophagitis: Secondary | ICD-10-CM | POA: Diagnosis not present

## 2023-09-22 DIAGNOSIS — E559 Vitamin D deficiency, unspecified: Secondary | ICD-10-CM | POA: Diagnosis not present

## 2023-09-22 DIAGNOSIS — K5900 Constipation, unspecified: Secondary | ICD-10-CM | POA: Diagnosis not present

## 2023-09-22 DIAGNOSIS — R5381 Other malaise: Secondary | ICD-10-CM | POA: Diagnosis not present

## 2023-09-22 DIAGNOSIS — I480 Paroxysmal atrial fibrillation: Secondary | ICD-10-CM | POA: Diagnosis not present

## 2023-09-22 DIAGNOSIS — R6510 Systemic inflammatory response syndrome (SIRS) of non-infectious origin without acute organ dysfunction: Secondary | ICD-10-CM | POA: Diagnosis not present

## 2023-09-22 DIAGNOSIS — E785 Hyperlipidemia, unspecified: Secondary | ICD-10-CM | POA: Diagnosis not present

## 2023-09-22 DIAGNOSIS — R278 Other lack of coordination: Secondary | ICD-10-CM | POA: Diagnosis not present

## 2023-09-22 DIAGNOSIS — J9811 Atelectasis: Secondary | ICD-10-CM | POA: Diagnosis not present

## 2023-09-22 DIAGNOSIS — J9601 Acute respiratory failure with hypoxia: Secondary | ICD-10-CM | POA: Diagnosis not present

## 2023-09-22 DIAGNOSIS — D696 Thrombocytopenia, unspecified: Secondary | ICD-10-CM | POA: Diagnosis not present

## 2023-09-22 DIAGNOSIS — R262 Difficulty in walking, not elsewhere classified: Secondary | ICD-10-CM | POA: Diagnosis not present

## 2023-09-22 DIAGNOSIS — M059 Rheumatoid arthritis with rheumatoid factor, unspecified: Secondary | ICD-10-CM | POA: Diagnosis not present

## 2023-09-22 MED ORDER — GUAIFENESIN-DM 100-10 MG/5ML PO SYRP: 5.0000 mL | ORAL_SOLUTION | ORAL | 0 refills | Status: DC | PRN

## 2023-09-22 MED ORDER — LEVALBUTEROL HCL 0.63 MG/3ML IN NEBU
0.6300 mg | INHALATION_SOLUTION | Freq: Two times a day (BID) | RESPIRATORY_TRACT | Status: DC
Start: 2023-09-22 — End: 2023-09-22

## 2023-09-22 MED ORDER — METOPROLOL TARTRATE 37.5 MG PO TABS: 37.5000 mg | ORAL_TABLET | Freq: Two times a day (BID) | ORAL | 0 refills | Status: DC

## 2023-09-22 MED ORDER — APIXABAN 5 MG PO TABS: 5.0000 mg | ORAL_TABLET | Freq: Two times a day (BID) | ORAL | 2 refills | Status: DC

## 2023-09-22 MED ORDER — IPRATROPIUM BROMIDE 0.02 % IN SOLN: 0.5000 mg | Freq: Two times a day (BID) | RESPIRATORY_TRACT | Status: DC

## 2023-09-22 MED ORDER — LEVALBUTEROL HCL 0.63 MG/3ML IN NEBU: 0.6300 mg | INHALATION_SOLUTION | Freq: Four times a day (QID) | RESPIRATORY_TRACT | 12 refills | Status: DC | PRN

## 2023-09-22 MED ORDER — HYDROCORTISONE 1 % EX CREA: TOPICAL_CREAM | Freq: Two times a day (BID) | CUTANEOUS | 0 refills | Status: DC

## 2023-09-22 MED ORDER — IPRATROPIUM BROMIDE 0.02 % IN SOLN: 0.5000 mg | Freq: Three times a day (TID) | RESPIRATORY_TRACT | 12 refills | Status: DC

## 2023-09-22 NOTE — Care Management Important Message (Signed)
 Important Message  Patient Details  Name: Mary Moss MRN: [id number] Date of Birth: [birth year]   Important Message Given:  Yes - Medicare IM (reviewed letter telephonically at [phone])     Corey Harold 09/22/2023, 11:10 AM

## 2023-09-22 NOTE — Discharge Summary (Signed)
 Physician Discharge Summary   Patient: Mary Moss MRN: [id number] DOB: [birth year]  Admit date:     09/15/2023  Discharge date: 09/22/23  Discharge Physician: Kendell Bane   PCP: Adrian Prince, MD   Recommendations at discharge:   Follow-up with PCP in 1 week  Discharge Diagnoses: Principal Problem:   Influenza A Active Problems:   Essential hypertension   SIRS (systemic inflammatory response syndrome) (HCC)   Acute respiratory failure with hypoxia (HCC)   Atelectasis   GERD (gastroesophageal reflux disease)   Paroxysmal atrial fibrillation with RVR (HCC)  Resolved Problems:   * No resolved hospital problems. *  Hospital Course: 88 year old female with history of hypertension, GERD/PUD, CKD stage III, seropositive rheumatoid arthritis, hyperlipidemia, impaired glucose tolerance, and vitamin D deficiency presenting with 5-day history of generalized weakness, nonproductive cough, myalgias, arthralgias, and fevers and chills.  The patient progressively felt worse over the last 1 to 2 days.  She had some shortness of breath.  She complained of some loose stools without any hematochezia or melena.  She denies any chest pain or abdominal pain.  She denies any hemoptysis.  She denies any headache or neck pain.  She has some nausea without any emesis.  Her appetite has been poor. Patient endorses previous history of tobacco.  She quit 16 years ago after approximately 15-20-pack-year history. In the ED, the patient had a temperature up to 100.7 F.  She was hemodynamically stable with oxygen saturation 85% on room air.  She was placed on 2 L with saturation 92-94%. WBC 6.8, hemoglobin 14.8, platelets 168.  Sodium 135, potassium 3.8, bicarbonate 24, serum creatinine 0.94.  LFTs were unremarkable.  The patient was given a 500 cc normal saline bolus in the ED.  The patient was offered oseltamavir, but she deferred expressing concern regarding side effects.  While in the emergency  department, the patient developed atrial fibrillation with RVR.  She was started on diltiazem drip.  She converted back to sinus later in the day.  She was transitioned to po metoprolol.  Cardiology was consulted to assist with management. She was started on apixaban without complications.   Acute respiratory failure with hypoxia -Continue to improve, currently on room air-satting 94% -Secondary to influenza -Initially placed on 4 L [place]>>2L>>.  Room air    Influenza pneumonitis Symptoms resolved Remain on room air, satting 94% -Patient deferred taking oseltamavir after discussion of the risks, benefits, and alternatives -Continue supportive care -continue xopenex -added atrovent  -2/24 CXR--personally reviewed-bibasilar opacities; chronic interstitial markings   New onset atrial fibrillation with RVR, type unspecified -Started diltiazem drip>>spontaneous conversion to sinus -transitioned to po metoprolol -appreciate cardiology -09/17/23 Echo-EF 55-60%%, no WMA, normal RVF -TSH--1.064 -Optimize electrolytes -CHADSVASc = 4 (age x 2, female, HTN) -initially expresses concern regarding anticoagulation initially but ultimately agreed (01/21/22 EGD--nonbleeding gastric ulcers--attributed to NSAID induced) and pt was on xarelto for DVT prophylaxis at the time -appreciate cardiology -IV heparin>>Apixaban -Hgb remains stable on apixaban   GERD -hx of PUD -continue pantoprazole   Seropositive RA -receives golinumab every 2 months--last dose 08/31/23 -restart leflunomide and hydroxychloroquine   Essential HTN -holding lisinopril and hydrochlorothiazide to allow BP margin for rate control of Afib -now on metoprolol tartrate -BP controlled   Gout -restart allopurinol once med rec is verified   Thrombocytopenia -due to viral infection -monitor CBC -improved   Constipation -add miralax and senna    Disposition: SNF  Diet recommendation:  Discharge Diet Orders (From admission,  onward)  Start     Ordered   09/22/23 0000  Diet - low sodium heart healthy        09/22/23 1022           Cardiac diet DISCHARGE MEDICATION: Allergies as of 09/22/2023       Reactions   Statins Other (See Comments)   Simvastatin, crestor, livalo Not able to walk because so much muscle/joint pain   Bacitracin-polymyxin B Rash   Neosporin [neomycin-bacitracin Zn-polymyx] Rash        Medication List     STOP taking these medications    clobetasol ointment 0.05 % Commonly known as: TEMOVATE   hydrochlorothiazide 25 MG tablet Commonly known as: HYDRODIURIL   lisinopril 10 MG tablet Commonly known as: ZESTRIL   triamcinolone cream 0.1 % Commonly known as: KENALOG       TAKE these medications    acetaminophen 325 MG tablet Commonly known as: TYLENOL Take 2 tablets (650 mg total) by mouth every 6 (six) hours as needed for mild pain (or Fever >/= 101).   Alive Hair, Skin & Nails Chew Chew 2 tablets by mouth daily.   allopurinol 100 MG tablet Commonly known as: ZYLOPRIM Take 100 mg by mouth daily.   apixaban 5 MG Tabs tablet Commonly known as: ELIQUIS Take 1 tablet (5 mg total) by mouth 2 (two) times daily.   D3-50 1.25 MG (50000 UT) capsule Generic drug: Cholecalciferol Take 50,000 Units by mouth every Sunday.   guaiFENesin-dextromethorphan 100-10 MG/5ML syrup Commonly known as: ROBITUSSIN DM Take 5 mLs by mouth every 4 (four) hours as needed for cough.   hydrocortisone cream 1 % Apply topically 2 (two) times daily.   hydroxychloroquine 200 MG tablet Commonly known as: PLAQUENIL Take 400 mg by mouth 2 (two) times daily.   ipratropium 0.02 % nebulizer solution Commonly known as: ATROVENT Take 2.5 mLs (0.5 mg total) by nebulization 3 (three) times daily.   leflunomide 20 MG tablet Commonly known as: ARAVA Take 20 mg by mouth daily.   levalbuterol 0.63 MG/3ML nebulizer solution Commonly known as: XOPENEX Take 3 mLs (0.63 mg total) by  nebulization every 6 (six) hours as needed for wheezing or shortness of breath.   melatonin 3 MG Tabs tablet Take 2 tablets (6 mg total) by mouth at bedtime as needed.   Metoprolol Tartrate 37.5 MG Tabs Take 1 tablet (37.5 mg total) by mouth 2 (two) times daily.   pantoprazole 40 MG tablet Commonly known as: Protonix Take 1 tablet (40 mg total) by mouth daily.   ProAir RespiClick 108 (90 Base) MCG/ACT Aepb Generic drug: Albuterol Sulfate Inhale 1-2 puffs into the lungs every 6 (six) hours as needed (shortness of breath).   Tylenol PM Extra Strength 500-25 MG Tabs tablet Generic drug: diphenhydramine-acetaminophen Take 1 tablet by mouth at bedtime.        Follow-up Information     Ellsworth Lennox, PA-C Follow up on 10/14/2023.   Specialties: Cardiology, Cardiology Why: Cardiology Follow-up on 10/14/2023 at 2:30 PM. Contact information: [address] [place] [address] [phone]                Discharge Exam: Ceasar Mons Weights   09/15/23 1700 09/16/23 1105  Weight: 93.7 kg 96.1 kg        General:  AAO x 3,  cooperative, no distress;   HEENT:  Normocephalic, PERRL, otherwise with in Normal limits   Neuro:  CNII-XII intact. , normal motor and sensation, reflexes intact  Lungs:   Clear to auscultation BL, Respirations unlabored,  No wheezes / crackles  Cardio:    S1/S2, RRR, No murmure, No Rubs or Gallops   Abdomen:  Soft, non-tender, bowel sounds active all four quadrants, no guarding or peritoneal signs.  Muscular  skeletal:  Limited exam -global generalized weaknesses - in bed, able to move all 4 extremities,   2+ pulses,  symmetric, No pitting edema  Skin:  Dry, warm to touch, negative for any Rashes,  Wounds: Please see nursing documentation          Condition at discharge: good  The results of significant diagnostics from this hospitalization (including imaging, microbiology, ancillary and laboratory) are listed below for reference.    Imaging Studies: DG CHEST PORT 1 VIEW Result Date: 09/20/2023 CLINICAL DATA:  Influenza.  Acute respiratory failure with hypoxia. EXAM: PORTABLE CHEST 1 VIEW COMPARISON:  Radiographs 09/15/2023 and 10/08/2021. FINDINGS: The heart size and mediastinal contours are stable with aortic atherosclerosis. There is chronic central airway thickening with patchy bibasilar opacities, similar to recent prior studies and mildly increased from 2023. No edema, significant pleural effusion or pneumothorax. The bones appear unchanged. Patient is status post right shoulder reverse arthroplasty. Telemetry leads overlie the chest. IMPRESSION: Chronic central airway thickening with patchy bibasilar opacities, similar to recent prior studies and mildly increased from 2023. Findings may reflect chronic atelectasis, scarring or infection. No confluent airspace disease. Electronically Signed   By: Carey Bullocks M.D.   On: 09/20/2023 13:18   ECHOCARDIOGRAM COMPLETE Result Date: 09/17/2023    ECHOCARDIOGRAM REPORT   Patient Name:   VIANNE GRIESHOP Date of Exam: 09/17/2023 Medical Rec #:  [id number]         Height:       68.0 in Accession #:    [id number]        Weight:       211.9 lb Date of Birth:  1936/02/26        BSA:          2.094 m Patient Age:    87 years          BP:           114/68 mmHg Patient Gender: F                 HR:           78 bpm. [facility] Procedure: 2D Echo, Cardiac Doppler and Color Doppler (Both Spectral and Color            Flow Doppler were utilized during procedure). Indications:    I48.91* Unspeicified atrial fibrillation  History:        Patient has no prior history of Echocardiogram examinations.                 Signs/Symptoms:Current on this admission-FLU A POSITIVE; Risk                 Factors:Hypertension and Dyslipidemia.  Sonographer:    Dominica Severin RCS, RVS Referring Phys: [phone] DAVID TAT  Sonographer Comments: Global longitudinal strain was attempted. IMPRESSIONS  1. Left  ventricular ejection fraction, by estimation, is 55 to 60%. The left ventricle has normal function. The left ventricle has no regional wall motion abnormalities. There is mild left ventricular hypertrophy. Left ventricular diastolic parameters were normal. The global longitudinal strain is indeterminate.  2. Right ventricular systolic function is normal. The right ventricular size is normal. Tricuspid regurgitation signal is inadequate  for assessing PA pressure.  3. The mitral valve is normal in structure. No evidence of mitral valve regurgitation. No evidence of mitral stenosis.  4. The tricuspid valve is abnormal.  5. The aortic valve was not well visualized. There is mild calcification of the aortic valve. There is mild thickening of the aortic valve. Aortic valve regurgitation is not visualized. No aortic stenosis is present.  6. The inferior vena cava is normal in size with greater than 50% respiratory variability, suggesting right atrial pressure of 3 mmHg. Comparison(s): No prior Echocardiogram. FINDINGS  Left Ventricle: Left ventricular ejection fraction, by estimation, is 55 to 60%. The left ventricle has normal function. The left ventricle has no regional wall motion abnormalities. Strain was performed and the global longitudinal strain is indeterminate. The left ventricular internal cavity size was normal in size. There is mild left ventricular hypertrophy. Left ventricular diastolic parameters were normal. Right Ventricle: The right ventricular size is normal. Right vetricular wall thickness was not well visualized. Right ventricular systolic function is normal. Tricuspid regurgitation signal is inadequate for assessing PA pressure. Left Atrium: Left atrial size was normal in size. Right Atrium: Right atrial size was normal in size. Pericardium: There is no evidence of pericardial effusion. Mitral Valve: The mitral valve is normal in structure. There is mild thickening of the mitral valve leaflet(s).  There is mild calcification of the mitral valve leaflet(s). Mild mitral annular calcification. No evidence of mitral valve regurgitation. No evidence of mitral valve stenosis. MV peak gradient, 4.8 mmHg. The mean mitral valve gradient is 2.0 mmHg. Tricuspid Valve: The tricuspid valve is abnormal. Tricuspid valve regurgitation is mild . No evidence of tricuspid stenosis. Aortic Valve: The aortic valve was not well visualized. There is mild calcification of the aortic valve. There is mild thickening of the aortic valve. There is mild aortic valve annular calcification. Aortic valve regurgitation is not visualized. No aortic stenosis is present. Aortic valve mean gradient measures 6.0 mmHg. Aortic valve peak gradient measures 11.0 mmHg. Aortic valve area, by VTI measures 2.21 cm. Pulmonic Valve: The pulmonic valve was not well visualized. Pulmonic valve regurgitation is not visualized. No evidence of pulmonic stenosis. Aorta: The aortic root and ascending aorta are structurally normal, with no evidence of dilitation. Venous: The inferior vena cava is normal in size with greater than 50% respiratory variability, suggesting right atrial pressure of 3 mmHg. IAS/Shunts: No atrial level shunt detected by color flow Doppler. Additional Comments: 3D imaging was not performed.  LEFT VENTRICLE PLAX 2D LVIDd:         4.30 cm     Diastology LVIDs:         3.00 cm     LV e' medial:    8.16 cm/s LV PW:         1.10 cm     LV E/e' medial:  11.6 LV IVS:        1.10 cm     LV e' lateral:   10.20 cm/s LVOT diam:     2.00 cm     LV E/e' lateral: 9.2 LV SV:         70 LV SV Index:   33 LVOT Area:     3.14 cm  LV Volumes (MOD) LV vol d, MOD A2C: 63.7 ml LV vol d, MOD A4C: 50.8 ml LV vol s, MOD A2C: 24.1 ml LV vol s, MOD A4C: 21.8 ml LV SV MOD A2C:     39.6 ml LV SV MOD A4C:  50.8 ml LV SV MOD BP:      34.6 ml RIGHT VENTRICLE RV Basal diam:  3.40 cm RV Mid diam:    3.40 cm RV S prime:     12.30 cm/s TAPSE (M-mode): 3.2 cm LEFT ATRIUM              Index        RIGHT ATRIUM           Index LA diam:        3.20 cm 1.53 cm/m   RA Area:     13.20 cm LA Vol (A2C):   31.8 ml 15.18 ml/m  RA Volume:   24.40 ml  11.65 ml/m LA Vol (A4C):   36.1 ml 17.24 ml/m LA Biplane Vol: 36.4 ml 17.38 ml/m  AORTIC VALVE                     PULMONIC VALVE AV Area (Vmax):    2.12 cm      PV Vmax:       0.77 m/s AV Area (Vmean):   2.01 cm      PV Peak grad:  2.4 mmHg AV Area (VTI):     2.21 cm AV Vmax:           166.00 cm/s AV Vmean:          120.000 cm/s AV VTI:            0.317 m AV Peak Grad:      11.0 mmHg AV Mean Grad:      6.0 mmHg LVOT Vmax:         112.00 cm/s LVOT Vmean:        76.900 cm/s LVOT VTI:          0.223 m LVOT/AV VTI ratio: 0.70  AORTA Ao Root diam: 2.90 cm Ao Asc diam:  2.90 cm MITRAL VALVE MV Area (PHT): 4.80 cm     SHUNTS MV Area VTI:   2.31 cm     Systemic VTI:  0.22 m MV Peak grad:  4.8 mmHg     Systemic Diam: 2.00 cm MV Mean grad:  2.0 mmHg MV Vmax:       1.09 m/s MV Vmean:      69.6 cm/s MV Decel Time: 158 msec MV E velocity: 94.30 cm/s MV A velocity: 108.00 cm/s MV E/A ratio:  0.87 Dina Rich MD Electronically signed by Dina Rich MD Signature Date/Time: 09/17/2023/12:21:51 PM    Final    DG Chest 2 View Result Date: 09/15/2023 CLINICAL DATA:  Shortness of breath patient reports flu like symptoms. Hypoxia. EXAM: CHEST - 2 VIEW COMPARISON:  10/08/2021 FINDINGS: Chronic hyperinflation. Mild bronchial thickening. Ill-defined opacity in the lung bases suggestive of atelectasis and possible trace effusions. Stable heart size and mediastinal contours. Aortic atherosclerosis. No pulmonary edema. No pneumothorax. Reverse right shoulder arthroplasty. IMPRESSION: 1. Chronic hyperinflation with mild bronchial thickening. 2. Ill-defined opacity in the lung bases suggestive of atelectasis and possible trace effusions. Electronically Signed   By: Narda Rutherford M.D.   On: 09/15/2023 17:25    Microbiology: Results for orders placed  or performed during the hospital encounter of 09/15/23  Resp panel by RT-PCR (RSV, Flu A&B, Covid) Anterior Nasal Swab     Status: Abnormal   Collection Time: 09/15/23  4:55 PM   Specimen: Anterior Nasal Swab  Result Value Ref Range Status   SARS Coronavirus 2 by RT PCR NEGATIVE NEGATIVE Final  Comment: (NOTE) SARS-CoV-2 target nucleic acids are NOT DETECTED.  The SARS-CoV-2 RNA is generally detectable in upper respiratory specimens during the acute phase of infection. The lowest concentration of SARS-CoV-2 viral copies this assay can detect is 138 copies/mL. A negative result does not preclude SARS-Cov-2 infection and should not be used as the sole basis for treatment or other patient management decisions. A negative result may occur with  improper specimen collection/handling, submission of specimen other than nasopharyngeal swab, presence of viral mutation(s) within the areas targeted by this assay, and inadequate number of viral copies(<138 copies/mL). A negative result must be combined with clinical observations, patient history, and epidemiological information. The expected result is Negative.  Fact Sheet for Patients:  BloggerCourse.com  Fact Sheet for Healthcare Providers:  SeriousBroker.it  This test is no t yet approved or cleared by the [place] FDA and  has been authorized for detection and/or diagnosis of SARS-CoV-2 by FDA under an Emergency Use Authorization (EUA). This EUA will remain  in effect (meaning this test can be used) for the duration of the COVID-19 declaration under Section 564(b)(1) of the Act, 21 U.S.C.section 360bbb-3(b)(1), unless the authorization is terminated  or revoked sooner.       Influenza A by PCR POSITIVE (A) NEGATIVE Final   Influenza B by PCR NEGATIVE NEGATIVE Final    Comment: (NOTE) The Xpert Xpress SARS-CoV-2/FLU/RSV plus assay is intended as an aid in the diagnosis of  influenza from Nasopharyngeal swab specimens and should not be used as a sole basis for treatment. Nasal washings and aspirates are unacceptable for Xpert Xpress SARS-CoV-2/FLU/RSV testing.  Fact Sheet for Patients: BloggerCourse.com  Fact Sheet for Healthcare Providers: SeriousBroker.it  This test is not yet approved or cleared by the [place] FDA and has been authorized for detection and/or diagnosis of SARS-CoV-2 by FDA under an Emergency Use Authorization (EUA). This EUA will remain in effect (meaning this test can be used) for the duration of the COVID-19 declaration under Section 564(b)(1) of the Act, 21 U.S.C. section 360bbb-3(b)(1), unless the authorization is terminated or revoked.     Resp Syncytial Virus by PCR NEGATIVE NEGATIVE Final    Comment: (NOTE) Fact Sheet for Patients: BloggerCourse.com  Fact Sheet for Healthcare Providers: SeriousBroker.it  This test is not yet approved or cleared by the [place] FDA and has been authorized for detection and/or diagnosis of SARS-CoV-2 by FDA under an Emergency Use Authorization (EUA). This EUA will remain in effect (meaning this test can be used) for the duration of the COVID-19 declaration under Section 564(b)(1) of the Act, 21 U.S.C. section 360bbb-3(b)(1), unless the authorization is terminated or revoked.  Performed at [facility], [address] [place], [place] [address]   MRSA Next Gen by PCR, Nasal     Status: None   Collection Time: 09/16/23 10:54 AM   Specimen: Nasal Mucosa; Nasal Swab  Result Value Ref Range Status   MRSA by PCR Next Gen NOT DETECTED NOT DETECTED Final    Comment: (NOTE) The GeneXpert MRSA Assay (FDA approved for NASAL specimens only), is one component of a comprehensive MRSA colonization surveillance program. It is not intended to diagnose MRSA infection nor to guide or monitor  treatment for MRSA infections. Test performance is not FDA approved in patients less than 49 years old. Performed at [facility], [address], [place], [place] [address]   Culture, blood (Routine X 2) w Reflex to ID Panel     Status: None   Collection  Time: 09/16/23  6:00 PM   Specimen: BLOOD  Result Value Ref Range Status   Specimen Description BLOOD BLOOD LEFT HAND  Final   Special Requests   Final    BOTTLES DRAWN AEROBIC ONLY Blood Culture results may not be optimal due to an inadequate volume of blood received in culture bottles   Culture   Final    NO GROWTH 5 DAYS Performed at [facility], [address] [place], [place] [address]    Report Status 09/21/2023 FINAL  Final  Culture, blood (Routine X 2) w Reflex to ID Panel     Status: None   Collection Time: 09/16/23  6:06 PM   Specimen: BLOOD LEFT FOREARM  Result Value Ref Range Status   Specimen Description   Final    BLOOD LEFT FOREARM BOTTLES DRAWN AEROBIC AND ANAEROBIC   Special Requests   Final    Blood Culture results may not be optimal due to an inadequate volume of blood received in culture bottles   Culture   Final    NO GROWTH 5 DAYS Performed at [facility], [address], [place], [place] [address]    Report Status 09/21/2023 FINAL  Final    Labs: CBC: Recent Labs  Lab 09/15/23 1740 09/16/23 0439 09/17/23 0535 09/18/23 0400 09/19/23 0409 09/20/23 0504 09/21/23 0440  WBC 6.8   < > 3.6* 3.7* 4.2 4.8 4.9  NEUTROABS 5.5  --   --   --   --   --   --   HGB 14.8   < > 13.4 13.2 13.1 13.7 12.4  HCT 45.9   < > 40.9 39.8 40.5 42.9 39.1  MCV 94.4   < > 94.7 94.1 94.0 94.5 93.8  PLT 164   < > 149* 143* 169 165 191   < > = values in this interval not displayed.   Basic Metabolic Panel: Recent Labs  Lab 09/15/23 1740 09/16/23 0439 09/17/23 0535 09/20/23 0504 09/21/23 0440  NA 135 136 134* 134* 135  K 3.8 3.5 4.1 3.7 3.7  CL 97* 99 101 101 100  CO2 24 26 25 24 26   GLUCOSE 108* 93 95 113* 114*   BUN 19 21 19  25* 22  CREATININE 0.94 0.97 0.88 1.02* 0.90  CALCIUM 8.8* 8.2* 8.3* 8.3* 8.1*  MG  --  1.9 2.1 2.0  --   PHOS  --  3.4  --   --   --    Liver Function Tests: Recent Labs  Lab 09/15/23 1740 09/16/23 0439  AST 34 30  ALT 24 20  ALKPHOS 76 63  BILITOT 1.1 1.1  PROT 7.1 6.2*  ALBUMIN 3.5 3.0*   CBG: No results for input(s): "GLUCAP" in the last 168 hours.  Discharge time spent: greater than 30 minutes.  Signed: Kendell Bane, MD [facility] 09/22/2023

## 2023-09-22 NOTE — Consult Note (Signed)
 [facility] Liaison Note  09/22/2023  [name] [birth year] [id number]  Location: RN Hospital Liaison screened the patient remotely at [facility].  Insurance: [employer]   Mary Moss is a 88 y.o. female who is a Primary Care Patient of Adrian Prince, MD [facility]. The patient was screened for  readmission hospitalization with noted low risk score for unplanned readmission risk with1 IP in 6 months.  The patient was assessed for potential Care Management service needs for post hospital transition for care coordination. Review of patient's electronic medical record reveals patient was admitted with Influenza.Pt recommended for SNF [facility]). Facility will continue to address pt's ongoing needs. Liaison spoke with pt concerning VBCI for any pharmacy needs once sue returns to community (residence) and provided information on VBCI and how to reach if again there are pharmacy needs (pt verbalized an understanding). No further inquires or needs presented for VBCI to address at this time.   VBCI Care Management/Population Health does not replace or interfere with any arrangements made by the Inpatient Transition of Care team.   For questions contact:   Elliot Cousin, RN, BSN Hospital Liaison [place]   [facility], Population Health Office Hours MTWF  8:00 am-6:00 pm Direct Dial: [phone] mobile [email or web address]

## 2023-09-22 NOTE — TOC Transition Note (Signed)
 Transition of Care [facility]) - Discharge Note   Patient Details  Name: Mary Moss MRN: [id number] Date of Birth: 1936-05-26  Transition of Care [facility]) CM/SW Contact:  Leitha Bleak, RN Phone Number: 09/22/2023, 10:50 AM   Clinical Narrative:   Susa Day provided room number and number for report. RN calling. TOC sent clinicals and scheduled EMS. Called family to update them with discharge plan.    Final next level of care: Skilled Nursing Facility Barriers to Discharge: Barriers Resolved   Patient Goals and CMS Choice Patient states their goals for this hospitalization and ongoing recovery are:: agreeable to SNF CMS Medicare.gov Compare Post Acute Care list provided to:: Patient Choice offered to / list presented to : Patient     Discharge Placement               Patient to be transferred to facility by: EMS Name of family member notified: Son in law Patient and family notified of of transfer: 09/22/23  Discharge Plan and Services Additional resources added to the After Visit Summary for   In-house Referral: Clinical Social Work Discharge Planning Services: CM Consult Post Acute Care Choice:  (still deciding)                Social Drivers of Health (SDOH) Interventions SDOH Screenings   Food Insecurity: No Food Insecurity (09/15/2023)  Housing: Low Risk  (09/15/2023)  Transportation Needs: No Transportation Needs (09/16/2023)  Utilities: Not At Risk (09/15/2023)  Depression (PHQ2-9): Medium Risk (01/30/2022)  Financial Resource Strain: Low Risk  (09/13/2019)  Physical Activity: Inactive (09/13/2019)  Social Connections: Moderately Integrated (09/15/2023)  Stress: No Stress Concern Present (09/13/2019)  Tobacco Use: Medium Risk (09/15/2023)    Readmission Risk Interventions    09/22/2023   10:50 AM  Readmission Risk Prevention Plan  Post Dischage Appt Complete  Medication Screening Complete

## 2023-09-22 NOTE — Progress Notes (Signed)
 Patient discharged to [facility] via EMS in stable condition. Report called to facility.

## 2023-09-22 NOTE — Plan of Care (Signed)
  Problem: Education: Goal: Knowledge of General Education information will improve Description: Including pain rating scale, medication(s)/side effects and non-pharmacologic comfort measures Outcome: Progressing   Problem: Health Behavior/Discharge Planning: Goal: Ability to manage health-related needs will improve Outcome: Progressing   Problem: Clinical Measurements: Goal: Ability to maintain clinical measurements within normal limits will improve Outcome: Progressing Goal: Respiratory complications will improve Outcome: Progressing   Problem: Coping: Goal: Level of anxiety will decrease Outcome: Progressing   Problem: Elimination: Goal: Will not experience complications related to bowel motility Outcome: Progressing   Problem: Safety: Goal: Ability to remain free from injury will improve Outcome: Progressing   Problem: Skin Integrity: Goal: Risk for impaired skin integrity will decrease Outcome: Progressing

## 2023-09-23 DIAGNOSIS — E785 Hyperlipidemia, unspecified: Secondary | ICD-10-CM | POA: Diagnosis not present

## 2023-09-23 DIAGNOSIS — R6510 Systemic inflammatory response syndrome (SIRS) of non-infectious origin without acute organ dysfunction: Secondary | ICD-10-CM | POA: Diagnosis not present

## 2023-09-23 DIAGNOSIS — K219 Gastro-esophageal reflux disease without esophagitis: Secondary | ICD-10-CM | POA: Diagnosis not present

## 2023-09-23 DIAGNOSIS — I480 Paroxysmal atrial fibrillation: Secondary | ICD-10-CM | POA: Diagnosis not present

## 2023-09-23 DIAGNOSIS — I10 Essential (primary) hypertension: Secondary | ICD-10-CM | POA: Diagnosis not present

## 2023-09-23 DIAGNOSIS — M109 Gout, unspecified: Secondary | ICD-10-CM | POA: Diagnosis not present

## 2023-09-23 DIAGNOSIS — J9601 Acute respiratory failure with hypoxia: Secondary | ICD-10-CM | POA: Diagnosis not present

## 2023-09-23 DIAGNOSIS — M059 Rheumatoid arthritis with rheumatoid factor, unspecified: Secondary | ICD-10-CM | POA: Diagnosis not present

## 2023-09-23 DIAGNOSIS — J101 Influenza due to other identified influenza virus with other respiratory manifestations: Secondary | ICD-10-CM | POA: Diagnosis not present

## 2023-09-23 DIAGNOSIS — D696 Thrombocytopenia, unspecified: Secondary | ICD-10-CM | POA: Diagnosis not present

## 2023-09-23 DIAGNOSIS — J9811 Atelectasis: Secondary | ICD-10-CM | POA: Diagnosis not present

## 2023-09-23 DIAGNOSIS — E559 Vitamin D deficiency, unspecified: Secondary | ICD-10-CM | POA: Diagnosis not present

## 2023-09-27 DIAGNOSIS — R5381 Other malaise: Secondary | ICD-10-CM | POA: Diagnosis not present

## 2023-09-27 DIAGNOSIS — J101 Influenza due to other identified influenza virus with other respiratory manifestations: Secondary | ICD-10-CM | POA: Diagnosis not present

## 2023-09-30 DIAGNOSIS — I129 Hypertensive chronic kidney disease with stage 1 through stage 4 chronic kidney disease, or unspecified chronic kidney disease: Secondary | ICD-10-CM | POA: Diagnosis not present

## 2023-09-30 DIAGNOSIS — I10 Essential (primary) hypertension: Secondary | ICD-10-CM | POA: Diagnosis not present

## 2023-09-30 DIAGNOSIS — J101 Influenza due to other identified influenza virus with other respiratory manifestations: Secondary | ICD-10-CM | POA: Diagnosis not present

## 2023-09-30 DIAGNOSIS — D696 Thrombocytopenia, unspecified: Secondary | ICD-10-CM | POA: Diagnosis not present

## 2023-09-30 DIAGNOSIS — M109 Gout, unspecified: Secondary | ICD-10-CM | POA: Diagnosis not present

## 2023-09-30 DIAGNOSIS — J1000 Influenza due to other identified influenza virus with unspecified type of pneumonia: Secondary | ICD-10-CM | POA: Diagnosis not present

## 2023-09-30 DIAGNOSIS — F411 Generalized anxiety disorder: Secondary | ICD-10-CM | POA: Diagnosis not present

## 2023-09-30 DIAGNOSIS — N183 Chronic kidney disease, stage 3 unspecified: Secondary | ICD-10-CM | POA: Diagnosis not present

## 2023-09-30 DIAGNOSIS — K219 Gastro-esophageal reflux disease without esophagitis: Secondary | ICD-10-CM | POA: Diagnosis not present

## 2023-09-30 DIAGNOSIS — M059 Rheumatoid arthritis with rheumatoid factor, unspecified: Secondary | ICD-10-CM | POA: Diagnosis not present

## 2023-09-30 DIAGNOSIS — J9811 Atelectasis: Secondary | ICD-10-CM | POA: Diagnosis not present

## 2023-09-30 DIAGNOSIS — E785 Hyperlipidemia, unspecified: Secondary | ICD-10-CM | POA: Diagnosis not present

## 2023-09-30 DIAGNOSIS — R5381 Other malaise: Secondary | ICD-10-CM | POA: Diagnosis not present

## 2023-09-30 DIAGNOSIS — I480 Paroxysmal atrial fibrillation: Secondary | ICD-10-CM | POA: Diagnosis not present

## 2023-09-30 DIAGNOSIS — K5900 Constipation, unspecified: Secondary | ICD-10-CM | POA: Diagnosis not present

## 2023-09-30 DIAGNOSIS — J189 Pneumonia, unspecified organism: Secondary | ICD-10-CM | POA: Diagnosis not present

## 2023-09-30 DIAGNOSIS — J9601 Acute respiratory failure with hypoxia: Secondary | ICD-10-CM | POA: Diagnosis not present

## 2023-10-01 DIAGNOSIS — J9601 Acute respiratory failure with hypoxia: Secondary | ICD-10-CM | POA: Diagnosis not present

## 2023-10-01 DIAGNOSIS — I4891 Unspecified atrial fibrillation: Secondary | ICD-10-CM | POA: Diagnosis not present

## 2023-10-01 DIAGNOSIS — D696 Thrombocytopenia, unspecified: Secondary | ICD-10-CM | POA: Diagnosis not present

## 2023-10-01 DIAGNOSIS — M1A9XX0 Chronic gout, unspecified, without tophus (tophi): Secondary | ICD-10-CM | POA: Diagnosis not present

## 2023-10-01 DIAGNOSIS — K5900 Constipation, unspecified: Secondary | ICD-10-CM | POA: Diagnosis not present

## 2023-10-01 DIAGNOSIS — R768 Other specified abnormal immunological findings in serum: Secondary | ICD-10-CM | POA: Diagnosis not present

## 2023-10-01 DIAGNOSIS — K254 Chronic or unspecified gastric ulcer with hemorrhage: Secondary | ICD-10-CM | POA: Diagnosis not present

## 2023-10-01 DIAGNOSIS — I129 Hypertensive chronic kidney disease with stage 1 through stage 4 chronic kidney disease, or unspecified chronic kidney disease: Secondary | ICD-10-CM | POA: Diagnosis not present

## 2023-10-01 DIAGNOSIS — J1100 Influenza due to unidentified influenza virus with unspecified type of pneumonia: Secondary | ICD-10-CM | POA: Diagnosis not present

## 2023-10-14 ENCOUNTER — Ambulatory Visit: Payer: HMO | Attending: Student | Admitting: Student

## 2023-10-14 ENCOUNTER — Encounter: Payer: Self-pay | Admitting: Student

## 2023-10-14 VITALS — BP 128/70 | HR 78 | Ht 68.0 in | Wt 207.4 lb

## 2023-10-14 DIAGNOSIS — Z7901 Long term (current) use of anticoagulants: Secondary | ICD-10-CM

## 2023-10-14 DIAGNOSIS — I480 Paroxysmal atrial fibrillation: Secondary | ICD-10-CM | POA: Diagnosis not present

## 2023-10-14 DIAGNOSIS — I10 Essential (primary) hypertension: Secondary | ICD-10-CM

## 2023-10-14 MED ORDER — METOPROLOL TARTRATE 37.5 MG PO TABS: 37.5000 mg | ORAL_TABLET | Freq: Two times a day (BID) | ORAL | 3 refills | Status: AC

## 2023-10-14 MED ORDER — METOPROLOL TARTRATE 37.5 MG PO TABS: 37.5000 mg | ORAL_TABLET | Freq: Two times a day (BID) | ORAL | 3 refills | Status: DC

## 2023-10-14 NOTE — Progress Notes (Signed)
 Cardiology Office Note    Date:  10/14/2023  ID:  Mary Moss, DOB [birth year], MRN [id number] Cardiologist: Dina Rich, MD    History of Present Illness:    Mary Moss is a 88 y.o. female with past medical history of HTN, rheumatoid arthritis, GERD and recently diagnosed atrial fibrillation/flutter with RVR in the setting of Influenza A who presents to the office today for hospital follow-up.  She was most recently admitted to [facility] in 08/2023 for evaluation of worsening dyspnea, cough and chills. Was found to be positive for Influenza A and cardiology was consulted during admission given atrial fibrillation/flutter with RVR. She converted back to normal sinus rhythm with IV Cardizem and was started on PO Lopressor 37.5mg  BID. Was started on Eliquis for anticoagulation and was recommended to obtain an outpatient monitor at follow-up to see if any recurrence as her arrhythmia could have been isolated to her systemic illness.  In talking with the patient today, she reports that she has felt weak since her hospitalization but this is starting to improve. She has been trying to gradually increase her activity level around her home. She has been to the grocery store a few times and reports feeling stable with this if she holds onto a shopping cart while there. Denies any specific dyspnea on exertion, orthopnea, PND or pitting edema.  No recent chest pain or palpitations. Still having an intermittent cough. She has been checking her vitals with a pulse oximeter and reports her oxygen levels have been stable in the 90's and heart rate has been in the 70's to 80's.  Studies Reviewed:   EKG: EKG is ordered today and demonstrates:   EKG Interpretation Date/Time:  Thursday October 14 2023 14:15:49 EDT Ventricular Rate:  82 PR Interval:  168 QRS Duration:  72 QT Interval:  406 QTC Calculation: 474 R Axis:   47  Text Interpretation: Sinus rhythm with sinus arrhythmia with  occasional Premature ventricular complexes Low voltage QRS Confirmed by Randall An ([id number]) on 10/14/2023 2:24:12 PM       Echocardiogram: 08/2023 IMPRESSIONS     1. Left ventricular ejection fraction, by estimation, is 55 to 60%. The  left ventricle has normal function. The left ventricle has no regional  wall motion abnormalities. There is mild left ventricular hypertrophy.  Left ventricular diastolic parameters  were normal. The global longitudinal strain is indeterminate.   2. Right ventricular systolic function is normal. The right ventricular  size is normal. Tricuspid regurgitation signal is inadequate for assessing  PA pressure.   3. The mitral valve is normal in structure. No evidence of mitral valve  regurgitation. No evidence of mitral stenosis.   4. The tricuspid valve is abnormal.   5. The aortic valve was not well visualized. There is mild calcification  of the aortic valve. There is mild thickening of the aortic valve. Aortic  valve regurgitation is not visualized. No aortic stenosis is present.   6. The inferior vena cava is normal in size with greater than 50%  respiratory variability, suggesting right atrial pressure of 3 mmHg.   Comparison(s): No prior Echocardiogram.   Risk Assessment/Calculations:    CHA2DS2-VASc Score = 4   This indicates a 4.8% annual risk of stroke. The patient's score is based upon: CHF History: 0 HTN History: 1 Diabetes History: 0 Stroke History: 0 Vascular Disease History: 0 Age Score: 2 Gender Score: 1    Physical Exam:   VS:  BP 128/70 (Cuff Size: Normal)  Pulse 78   Ht 5\' 8"  (1.727 m)   Wt 207 lb 6.4 oz (94.1 kg)   SpO2 96%   BMI 31.54 kg/m    Wt Readings from Last 3 Encounters:  10/14/23 207 lb 6.4 oz (94.1 kg)  09/16/23 211 lb 13.8 oz (96.1 kg)  04/03/22 206 lb 12.7 oz (93.8 kg)     GEN: Pleasant, elderly female appearing in no acute distress NECK: No JVD; No carotid bruits CARDIAC: RRR, no murmurs,  rubs, gallops RESPIRATORY:  Occasional rhonchi along the left side. No wheezing or rales. ABDOMEN: Appears non-distended. No obvious abdominal masses. EXTREMITIES: No clubbing or cyanosis. No pitting edema.  Distal pedal pulses are 2+ bilaterally.   Assessment and Plan:   1. New-onset Atrial Fibrillation/Use of Long-term Anticoagulation - Diagnosed during her recent admission for Influenza A and was felt that her arrhythmia was likely due to her acute illness at that time. Reviewed options with the patient and she prefers to stop anticoagulation if able given her history of prior GIB (occurred in the setting of being on NSAIDs and Xarelto for DVT prophylaxis from hip replacement). Therefore, will plan for a 30-day monitor as recommended during her hospitalization. If this does not show any recurrent atrial flutter or fibrillation, could likely stop Eliquis. If found to have recurrence, would recommend long-term anticoagulation given her CHA2DS2-VASc score of 4. Continue Lopressor 37.5 mg twice daily.  2. HTN - BP is well-controlled at 128/70 during today's visit. Continue current medical therapy with HCTZ 25 mg daily and Lopressor 37.5 mg twice daily.  Signed, Ellsworth Lennox, PA-C

## 2023-10-14 NOTE — Patient Instructions (Signed)
 Medication Instructions:  Your physician recommends that you continue on your current medications as directed. Please refer to the Current Medication list given to you today.  *If you need a refill on your cardiac medications before your next appointment, please call your pharmacy*   Lab Work: NONE   If you have labs (blood work) drawn today and your tests are completely normal, you will receive your results only by: MyChart Message (if you have MyChart) OR A paper copy in the mail If you have any lab test that is abnormal or we need to change your treatment, we will call you to review the results.   Testing/Procedures: Your physician has recommended that you wear an event monitor. Event monitors are medical devices that record the heart's electrical activity. Doctors most often [place] these monitors to diagnose arrhythmias. Arrhythmias are problems with the speed or rhythm of the heartbeat. The monitor is a small, portable device. You can wear one while you do your normal daily activities. This is usually used to diagnose what is causing palpitations/syncope (passing out).    Follow-Up: At [facility], you and your health needs are our priority.  As part of our continuing mission to provide you with exceptional heart care, we have created designated Provider Care Teams.  These Care Teams include your primary Cardiologist (physician) and Advanced Practice Providers (APPs -  Physician Assistants and Nurse Practitioners) who all work together to provide you with the care you need, when you need it.  We recommend signing up for the patient portal called "MyChart".  Sign up information is provided on this After Visit Summary.  MyChart is used to connect with patients for Virtual Visits (Telemedicine).  Patients are able to view lab/test results, encounter notes, upcoming appointments, etc.  Non-urgent messages can be sent to your provider as well.   To learn more about what you can do with  MyChart, go to [email or web address].    Your next appointment:   6 month(s)  Provider:   You may see Dina Rich, MD or one of the following Advanced Practice Providers on your designated Care Team:   Randall An, PA-C  Jacolyn Reedy, [place]     Other Instructions Thank you for choosing [facility]!

## 2023-10-18 DIAGNOSIS — J1100 Influenza due to unidentified influenza virus with unspecified type of pneumonia: Secondary | ICD-10-CM | POA: Diagnosis not present

## 2023-10-21 DIAGNOSIS — M1A09X0 Idiopathic chronic gout, multiple sites, without tophus (tophi): Secondary | ICD-10-CM | POA: Diagnosis not present

## 2023-10-21 DIAGNOSIS — M1991 Primary osteoarthritis, unspecified site: Secondary | ICD-10-CM | POA: Diagnosis not present

## 2023-10-21 DIAGNOSIS — L409 Psoriasis, unspecified: Secondary | ICD-10-CM | POA: Diagnosis not present

## 2023-10-21 DIAGNOSIS — Z79899 Other long term (current) drug therapy: Secondary | ICD-10-CM | POA: Diagnosis not present

## 2023-10-21 DIAGNOSIS — Z6831 Body mass index (BMI) 31.0-31.9, adult: Secondary | ICD-10-CM | POA: Diagnosis not present

## 2023-10-21 DIAGNOSIS — R5383 Other fatigue: Secondary | ICD-10-CM | POA: Diagnosis not present

## 2023-10-21 DIAGNOSIS — M0579 Rheumatoid arthritis with rheumatoid factor of multiple sites without organ or systems involvement: Secondary | ICD-10-CM | POA: Diagnosis not present

## 2023-10-21 DIAGNOSIS — E669 Obesity, unspecified: Secondary | ICD-10-CM | POA: Diagnosis not present

## 2023-10-25 DIAGNOSIS — I4891 Unspecified atrial fibrillation: Secondary | ICD-10-CM | POA: Diagnosis not present

## 2023-10-26 ENCOUNTER — Other Ambulatory Visit: Payer: Self-pay

## 2023-10-26 ENCOUNTER — Ambulatory Visit: Attending: Cardiology

## 2023-10-26 ENCOUNTER — Ambulatory Visit

## 2023-10-26 DIAGNOSIS — R5383 Other fatigue: Secondary | ICD-10-CM | POA: Diagnosis not present

## 2023-10-26 DIAGNOSIS — Z79899 Other long term (current) drug therapy: Secondary | ICD-10-CM | POA: Diagnosis not present

## 2023-10-26 DIAGNOSIS — M0579 Rheumatoid arthritis with rheumatoid factor of multiple sites without organ or systems involvement: Secondary | ICD-10-CM | POA: Diagnosis not present

## 2023-10-26 DIAGNOSIS — I480 Paroxysmal atrial fibrillation: Secondary | ICD-10-CM

## 2023-11-02 DIAGNOSIS — E785 Hyperlipidemia, unspecified: Secondary | ICD-10-CM | POA: Diagnosis not present

## 2023-11-02 DIAGNOSIS — K219 Gastro-esophageal reflux disease without esophagitis: Secondary | ICD-10-CM | POA: Diagnosis not present

## 2023-11-02 DIAGNOSIS — J9811 Atelectasis: Secondary | ICD-10-CM | POA: Diagnosis not present

## 2023-11-02 DIAGNOSIS — D696 Thrombocytopenia, unspecified: Secondary | ICD-10-CM | POA: Diagnosis not present

## 2023-11-02 DIAGNOSIS — M059 Rheumatoid arthritis with rheumatoid factor, unspecified: Secondary | ICD-10-CM | POA: Diagnosis not present

## 2023-11-02 DIAGNOSIS — I480 Paroxysmal atrial fibrillation: Secondary | ICD-10-CM | POA: Diagnosis not present

## 2023-11-02 DIAGNOSIS — N183 Chronic kidney disease, stage 3 unspecified: Secondary | ICD-10-CM | POA: Diagnosis not present

## 2023-11-02 DIAGNOSIS — Z7901 Long term (current) use of anticoagulants: Secondary | ICD-10-CM | POA: Diagnosis not present

## 2023-11-02 DIAGNOSIS — J1000 Influenza due to other identified influenza virus with unspecified type of pneumonia: Secondary | ICD-10-CM | POA: Diagnosis not present

## 2023-11-02 DIAGNOSIS — I129 Hypertensive chronic kidney disease with stage 1 through stage 4 chronic kidney disease, or unspecified chronic kidney disease: Secondary | ICD-10-CM | POA: Diagnosis not present

## 2023-11-02 DIAGNOSIS — J9601 Acute respiratory failure with hypoxia: Secondary | ICD-10-CM | POA: Diagnosis not present

## 2023-11-02 DIAGNOSIS — J189 Pneumonia, unspecified organism: Secondary | ICD-10-CM | POA: Diagnosis not present

## 2023-11-29 DIAGNOSIS — R059 Cough, unspecified: Secondary | ICD-10-CM | POA: Diagnosis not present

## 2023-11-29 DIAGNOSIS — Z96611 Presence of right artificial shoulder joint: Secondary | ICD-10-CM | POA: Diagnosis not present

## 2023-11-29 DIAGNOSIS — R051 Acute cough: Secondary | ICD-10-CM | POA: Diagnosis not present

## 2023-11-29 DIAGNOSIS — I700 Atherosclerosis of aorta: Secondary | ICD-10-CM | POA: Diagnosis not present

## 2023-11-29 DIAGNOSIS — J189 Pneumonia, unspecified organism: Secondary | ICD-10-CM | POA: Diagnosis not present

## 2023-12-07 ENCOUNTER — Other Ambulatory Visit (INDEPENDENT_AMBULATORY_CARE_PROVIDER_SITE_OTHER): Payer: Self-pay | Admitting: Gastroenterology

## 2023-12-07 NOTE — Telephone Encounter (Signed)
 Needs office visit. Last seen 01/2022

## 2023-12-09 ENCOUNTER — Telehealth: Payer: Self-pay | Admitting: Student

## 2023-12-09 NOTE — Telephone Encounter (Signed)
   Please let the patient know the preliminary report of her monitor does not show any recurrent atrial fibrillation. She was in normal sinus rhythm throughout with occasional extra beats from the top and bottom chambers of the heart but less than 1% burden. As discussed at the time of her office visit in 09/2023, can stop Eliquis  at this time. If found to have recurrence in the future, would need to reinitiate it.  Signed, Dorma Gash, PA-C 12/09/2023, 4:49 PM Pager: [phone]

## 2023-12-09 NOTE — Telephone Encounter (Signed)
 The patient has been notified of the result and verbalized understanding.  All questions (if any) were answered. Carole Churches, LPN 7/82/9562 1:30 PM

## 2023-12-13 DIAGNOSIS — I4891 Unspecified atrial fibrillation: Secondary | ICD-10-CM

## 2023-12-14 DIAGNOSIS — L218 Other seborrheic dermatitis: Secondary | ICD-10-CM | POA: Diagnosis not present

## 2023-12-14 DIAGNOSIS — L853 Xerosis cutis: Secondary | ICD-10-CM | POA: Diagnosis not present

## 2023-12-21 DIAGNOSIS — Z79899 Other long term (current) drug therapy: Secondary | ICD-10-CM | POA: Diagnosis not present

## 2023-12-21 DIAGNOSIS — R5383 Other fatigue: Secondary | ICD-10-CM | POA: Diagnosis not present

## 2023-12-21 DIAGNOSIS — M0579 Rheumatoid arthritis with rheumatoid factor of multiple sites without organ or systems involvement: Secondary | ICD-10-CM | POA: Diagnosis not present

## 2023-12-24 ENCOUNTER — Other Ambulatory Visit: Payer: Self-pay

## 2023-12-24 ENCOUNTER — Emergency Department (HOSPITAL_COMMUNITY)

## 2023-12-24 ENCOUNTER — Encounter (HOSPITAL_COMMUNITY): Payer: Self-pay

## 2023-12-24 ENCOUNTER — Inpatient Hospital Stay (HOSPITAL_COMMUNITY)
Admission: EM | Admit: 2023-12-24 | Discharge: 2023-12-27 | DRG: 871 | Disposition: A | Discharge status: Home / Self Care | Attending: Internal Medicine | Admitting: Internal Medicine

## 2023-12-24 DIAGNOSIS — R918 Other nonspecific abnormal finding of lung field: Secondary | ICD-10-CM | POA: Diagnosis not present

## 2023-12-24 DIAGNOSIS — M109 Gout, unspecified: Secondary | ICD-10-CM | POA: Diagnosis present

## 2023-12-24 DIAGNOSIS — R6520 Severe sepsis without septic shock: Secondary | ICD-10-CM | POA: Diagnosis not present

## 2023-12-24 DIAGNOSIS — M059 Rheumatoid arthritis with rheumatoid factor, unspecified: Secondary | ICD-10-CM | POA: Diagnosis present

## 2023-12-24 DIAGNOSIS — Z6831 Body mass index (BMI) 31.0-31.9, adult: Secondary | ICD-10-CM | POA: Diagnosis not present

## 2023-12-24 DIAGNOSIS — Z87891 Personal history of nicotine dependence: Secondary | ICD-10-CM | POA: Diagnosis not present

## 2023-12-24 DIAGNOSIS — R0602 Shortness of breath: Secondary | ICD-10-CM | POA: Diagnosis not present

## 2023-12-24 DIAGNOSIS — R0989 Other specified symptoms and signs involving the circulatory and respiratory systems: Secondary | ICD-10-CM | POA: Diagnosis not present

## 2023-12-24 DIAGNOSIS — R7303 Prediabetes: Secondary | ICD-10-CM | POA: Diagnosis present

## 2023-12-24 DIAGNOSIS — Z820 Family history of epilepsy and other diseases of the nervous system: Secondary | ICD-10-CM

## 2023-12-24 DIAGNOSIS — J9811 Atelectasis: Secondary | ICD-10-CM | POA: Diagnosis not present

## 2023-12-24 DIAGNOSIS — A419 Sepsis, unspecified organism: Principal | ICD-10-CM | POA: Diagnosis present

## 2023-12-24 DIAGNOSIS — R531 Weakness: Secondary | ICD-10-CM | POA: Diagnosis not present

## 2023-12-24 DIAGNOSIS — Z8711 Personal history of peptic ulcer disease: Secondary | ICD-10-CM

## 2023-12-24 DIAGNOSIS — E46 Unspecified protein-calorie malnutrition: Secondary | ICD-10-CM | POA: Diagnosis not present

## 2023-12-24 DIAGNOSIS — I4891 Unspecified atrial fibrillation: Secondary | ICD-10-CM | POA: Diagnosis present

## 2023-12-24 DIAGNOSIS — J441 Chronic obstructive pulmonary disease with (acute) exacerbation: Secondary | ICD-10-CM

## 2023-12-24 DIAGNOSIS — Z96611 Presence of right artificial shoulder joint: Secondary | ICD-10-CM | POA: Diagnosis present

## 2023-12-24 DIAGNOSIS — I10 Essential (primary) hypertension: Secondary | ICD-10-CM | POA: Diagnosis not present

## 2023-12-24 DIAGNOSIS — I700 Atherosclerosis of aorta: Secondary | ICD-10-CM | POA: Diagnosis not present

## 2023-12-24 DIAGNOSIS — Z90710 Acquired absence of both cervix and uterus: Secondary | ICD-10-CM

## 2023-12-24 DIAGNOSIS — Z85828 Personal history of other malignant neoplasm of skin: Secondary | ICD-10-CM | POA: Diagnosis not present

## 2023-12-24 DIAGNOSIS — J9601 Acute respiratory failure with hypoxia: Principal | ICD-10-CM | POA: Diagnosis present

## 2023-12-24 DIAGNOSIS — N183 Chronic kidney disease, stage 3 unspecified: Secondary | ICD-10-CM | POA: Diagnosis present

## 2023-12-24 DIAGNOSIS — J440 Chronic obstructive pulmonary disease with acute lower respiratory infection: Secondary | ICD-10-CM | POA: Diagnosis present

## 2023-12-24 DIAGNOSIS — K219 Gastro-esophageal reflux disease without esophagitis: Secondary | ICD-10-CM

## 2023-12-24 DIAGNOSIS — I480 Paroxysmal atrial fibrillation: Secondary | ICD-10-CM | POA: Diagnosis present

## 2023-12-24 DIAGNOSIS — R630 Anorexia: Secondary | ICD-10-CM | POA: Diagnosis not present

## 2023-12-24 DIAGNOSIS — E872 Acidosis, unspecified: Secondary | ICD-10-CM | POA: Diagnosis not present

## 2023-12-24 DIAGNOSIS — Z801 Family history of malignant neoplasm of trachea, bronchus and lung: Secondary | ICD-10-CM

## 2023-12-24 DIAGNOSIS — E785 Hyperlipidemia, unspecified: Secondary | ICD-10-CM | POA: Diagnosis present

## 2023-12-24 DIAGNOSIS — J189 Pneumonia, unspecified organism: Secondary | ICD-10-CM | POA: Diagnosis not present

## 2023-12-24 DIAGNOSIS — Z5329 Procedure and treatment not carried out because of patient's decision for other reasons: Secondary | ICD-10-CM | POA: Diagnosis not present

## 2023-12-24 DIAGNOSIS — J181 Lobar pneumonia, unspecified organism: Secondary | ICD-10-CM | POA: Diagnosis not present

## 2023-12-24 DIAGNOSIS — Z1152 Encounter for screening for COVID-19: Secondary | ICD-10-CM

## 2023-12-24 DIAGNOSIS — E8809 Other disorders of plasma-protein metabolism, not elsewhere classified: Secondary | ICD-10-CM | POA: Diagnosis not present

## 2023-12-24 DIAGNOSIS — Z8709 Personal history of other diseases of the respiratory system: Secondary | ICD-10-CM | POA: Diagnosis not present

## 2023-12-24 DIAGNOSIS — I129 Hypertensive chronic kidney disease with stage 1 through stage 4 chronic kidney disease, or unspecified chronic kidney disease: Secondary | ICD-10-CM | POA: Diagnosis not present

## 2023-12-24 DIAGNOSIS — Z96651 Presence of right artificial knee joint: Secondary | ICD-10-CM | POA: Diagnosis present

## 2023-12-24 DIAGNOSIS — E876 Hypokalemia: Secondary | ICD-10-CM | POA: Diagnosis present

## 2023-12-24 DIAGNOSIS — J449 Chronic obstructive pulmonary disease, unspecified: Secondary | ICD-10-CM | POA: Diagnosis not present

## 2023-12-24 DIAGNOSIS — I482 Chronic atrial fibrillation, unspecified: Secondary | ICD-10-CM

## 2023-12-24 DIAGNOSIS — Z9841 Cataract extraction status, right eye: Secondary | ICD-10-CM

## 2023-12-24 DIAGNOSIS — Z9842 Cataract extraction status, left eye: Secondary | ICD-10-CM

## 2023-12-24 DIAGNOSIS — Z888 Allergy status to other drugs, medicaments and biological substances status: Secondary | ICD-10-CM

## 2023-12-24 DIAGNOSIS — Z79899 Other long term (current) drug therapy: Secondary | ICD-10-CM

## 2023-12-24 DIAGNOSIS — Z96642 Presence of left artificial hip joint: Secondary | ICD-10-CM | POA: Diagnosis present

## 2023-12-24 DIAGNOSIS — Z961 Presence of intraocular lens: Secondary | ICD-10-CM | POA: Diagnosis present

## 2023-12-24 LAB — RESP PANEL BY RT-PCR (RSV, FLU A&B, COVID)  RVPGX2
Influenza A by PCR: NEGATIVE
Influenza B by PCR: NEGATIVE
Resp Syncytial Virus by PCR: NEGATIVE
SARS Coronavirus 2 by RT PCR: NEGATIVE

## 2023-12-24 LAB — COMPREHENSIVE METABOLIC PANEL WITH GFR
ALT: 16 U/L (ref 0–44)
AST: 22 U/L (ref 15–41)
Albumin: 2.9 g/dL — ABNORMAL LOW (ref 3.5–5.0)
Alkaline Phosphatase: 88 U/L (ref 38–126)
Anion gap: 14 (ref 5–15)
BUN: 17 mg/dL (ref 8–23)
CO2: 27 mmol/L (ref 22–32)
Calcium: 8.6 mg/dL — ABNORMAL LOW (ref 8.9–10.3)
Chloride: 93 mmol/L — ABNORMAL LOW (ref 98–111)
Creatinine, Ser: 1.05 mg/dL — ABNORMAL HIGH (ref 0.44–1.00)
GFR, Estimated: 52 mL/min — ABNORMAL LOW
Glucose, Bld: 146 mg/dL — ABNORMAL HIGH (ref 70–99)
Potassium: 3.5 mmol/L (ref 3.5–5.1)
Sodium: 134 mmol/L — ABNORMAL LOW (ref 135–145)
Total Bilirubin: 1.2 mg/dL (ref 0.0–1.2)
Total Protein: 7 g/dL (ref 6.5–8.1)

## 2023-12-24 LAB — CBC WITH DIFFERENTIAL/PLATELET
Abs Immature Granulocytes: 0.09 10*3/uL — ABNORMAL HIGH (ref 0.00–0.07)
Basophils Absolute: 0.1 10*3/uL (ref 0.0–0.1)
Basophils Relative: 1 %
Eosinophils Absolute: 0 10*3/uL (ref 0.0–0.5)
Eosinophils Relative: 0 %
HCT: 40.7 % (ref 36.0–46.0)
Hemoglobin: 12.9 g/dL (ref 12.0–15.0)
Immature Granulocytes: 1 %
Lymphocytes Relative: 9 %
Lymphs Abs: 1 10*3/uL (ref 0.7–4.0)
MCH: 30.2 pg (ref 26.0–34.0)
MCHC: 31.7 g/dL (ref 30.0–36.0)
MCV: 95.3 fL (ref 80.0–100.0)
Monocytes Absolute: 0.9 10*3/uL (ref 0.1–1.0)
Monocytes Relative: 8 %
Neutro Abs: 9.8 10*3/uL — ABNORMAL HIGH (ref 1.7–7.7)
Neutrophils Relative %: 81 %
Platelets: 238 10*3/uL (ref 150–400)
RBC: 4.27 MIL/uL (ref 3.87–5.11)
RDW: 13.4 % (ref 11.5–15.5)
WBC: 12 10*3/uL — ABNORMAL HIGH (ref 4.0–10.5)
nRBC: 0 % (ref 0.0–0.2)

## 2023-12-24 LAB — BRAIN NATRIURETIC PEPTIDE: B Natriuretic Peptide: 137 pg/mL — ABNORMAL HIGH (ref 0.0–100.0)

## 2023-12-24 LAB — LACTIC ACID, PLASMA
Lactic Acid, Venous: 3.1 mmol/L (ref 0.5–1.9)
Lactic Acid, Venous: 1.2 mmol/L (ref 0.5–1.9)

## 2023-12-24 MED ORDER — ONDANSETRON HCL 4 MG/2ML IJ SOLN
4.0000 mg | Freq: Four times a day (QID) | INTRAMUSCULAR | Status: DC | PRN
Start: 2023-12-24 — End: 2023-12-27

## 2023-12-24 MED ORDER — METHYLPREDNISOLONE SODIUM SUCC 40 MG IJ SOLR
40.0000 mg | Freq: Two times a day (BID) | INTRAMUSCULAR | Status: AC
  Administered 2023-12-25 – 2023-12-27 (×5): 40 mg via INTRAVENOUS
  Filled 2023-12-24 (×4): qty 1

## 2023-12-24 MED ORDER — ACETAMINOPHEN 325 MG PO TABS: 650.0000 mg | ORAL_TABLET | Freq: Four times a day (QID) | ORAL | Status: DC | PRN

## 2023-12-24 MED ORDER — DM-GUAIFENESIN ER 30-600 MG PO TB12
1.0000 | ORAL_TABLET | Freq: Two times a day (BID) | ORAL | Status: DC
  Administered 2023-12-24 – 2023-12-27 (×6): 1 via ORAL
  Filled 2023-12-24 (×6): qty 1

## 2023-12-24 MED ORDER — IOHEXOL 350 MG/ML SOLN
75.0000 mL | Freq: Once | INTRAVENOUS | Status: AC | PRN
  Administered 2023-12-24: 75 mL via INTRAVENOUS

## 2023-12-24 MED ORDER — ALLOPURINOL 100 MG PO TABS
100.0000 mg | ORAL_TABLET | Freq: Every day | ORAL | Status: DC
  Administered 2023-12-25 – 2023-12-27 (×3): 100 mg via ORAL
  Filled 2023-12-24 (×3): qty 1

## 2023-12-24 MED ORDER — ACETAMINOPHEN 650 MG RE SUPP: 650.0000 mg | Freq: Four times a day (QID) | RECTAL | Status: DC | PRN

## 2023-12-24 MED ORDER — PANTOPRAZOLE SODIUM 40 MG PO TBEC
40.0000 mg | DELAYED_RELEASE_TABLET | Freq: Every day | ORAL | Status: DC
  Administered 2023-12-25 – 2023-12-27 (×3): 40 mg via ORAL
  Filled 2023-12-24 (×3): qty 1

## 2023-12-24 MED ORDER — GUAIFENESIN-DM 100-10 MG/5ML PO SYRP: 5.0000 mL | ORAL_SOLUTION | ORAL | Status: DC | PRN

## 2023-12-24 MED ORDER — SODIUM CHLORIDE 0.9 % IV SOLN
1.0000 g | Freq: Once | INTRAVENOUS | Status: AC
  Administered 2023-12-24: 1 g via INTRAVENOUS
  Filled 2023-12-24: qty 10

## 2023-12-24 MED ORDER — LEVALBUTEROL HCL 0.63 MG/3ML IN NEBU
0.6300 mg | INHALATION_SOLUTION | Freq: Four times a day (QID) | RESPIRATORY_TRACT | Status: DC
  Administered 2023-12-25 – 2023-12-27 (×8): 0.63 mg via RESPIRATORY_TRACT
  Filled 2023-12-24 (×8): qty 3

## 2023-12-24 MED ORDER — ONDANSETRON HCL 4 MG PO TABS: 4.0000 mg | ORAL_TABLET | Freq: Four times a day (QID) | ORAL | Status: DC | PRN

## 2023-12-24 MED ORDER — SODIUM CHLORIDE 0.9 % IV SOLN
500.0000 mg | Freq: Once | INTRAVENOUS | Status: AC
  Administered 2023-12-24: 500 mg via INTRAVENOUS
  Filled 2023-12-24: qty 5

## 2023-12-24 MED ORDER — SODIUM CHLORIDE 0.9 % IV BOLUS
1000.0000 mL | Freq: Once | INTRAVENOUS | Status: AC
Start: 2023-12-24 — End: 2023-12-24
  Administered 2023-12-24: 1000 mL via INTRAVENOUS

## 2023-12-24 MED ORDER — ENOXAPARIN SODIUM 40 MG/0.4ML IJ SOSY
40.0000 mg | PREFILLED_SYRINGE | INTRAMUSCULAR | Status: DC
  Administered 2023-12-25 – 2023-12-27 (×3): 40 mg via SUBCUTANEOUS
  Filled 2023-12-24 (×3): qty 0.4

## 2023-12-24 MED ORDER — SODIUM CHLORIDE 0.9 % IV SOLN
500.0000 mg | INTRAVENOUS | Status: DC
  Administered 2023-12-25 – 2023-12-26 (×2): 500 mg via INTRAVENOUS
  Filled 2023-12-24 (×2): qty 5

## 2023-12-24 MED ORDER — METOPROLOL TARTRATE 25 MG PO TABS
37.5000 mg | ORAL_TABLET | Freq: Two times a day (BID) | ORAL | Status: DC
  Administered 2023-12-25 – 2023-12-27 (×5): 37.5 mg via ORAL
  Filled 2023-12-24 (×5): qty 2

## 2023-12-24 MED ORDER — SODIUM CHLORIDE 0.9 % IV SOLN
2.0000 g | INTRAVENOUS | Status: DC
  Administered 2023-12-25 – 2023-12-26 (×2): 2 g via INTRAVENOUS
  Filled 2023-12-24 (×2): qty 20

## 2023-12-24 MED ORDER — HYDROCHLOROTHIAZIDE 25 MG PO TABS: 25.0000 mg | ORAL_TABLET | Freq: Every day | ORAL | Status: DC

## 2023-12-24 MED ORDER — ENSURE PLUS HIGH PROTEIN PO LIQD
237.0000 mL | Freq: Two times a day (BID) | ORAL | Status: DC
  Administered 2023-12-25 – 2023-12-27 (×5): 237 mL via ORAL
  Filled 2023-12-24 (×2): qty 237

## 2023-12-24 NOTE — ED Triage Notes (Signed)
 Pt arrived via [facility] from Urgent Care for further evaluation of worsening SOB and productive cough. Pt reports receiving 2 breathing treatments PTA. Pts O2 Sats 88% on room air in Triage. Pt reports symptoms have worsened since the weekend.

## 2023-12-24 NOTE — ED Notes (Signed)
 Delay in abx and IV fluids admin due to a positional IV and pt is a hard stick.  This will cause a delay in repeat lactic acid.  Provider Dr. Randal Bury notified.

## 2023-12-24 NOTE — ED Notes (Signed)
 Updated daughter, Leary Provencal on pt's status.

## 2023-12-24 NOTE — ED Notes (Signed)
 Lab called of lactic acid

## 2023-12-24 NOTE — Plan of Care (Signed)
   Problem: Education: Goal: Knowledge of General Education information will improve Description Including pain rating scale, medication(s)/side effects and non-pharmacologic comfort measures Outcome: Progressing   Problem: Health Behavior/Discharge Planning: Goal: Ability to manage health-related needs will improve Outcome: Progressing

## 2023-12-24 NOTE — ED Notes (Signed)
Provider notifed

## 2023-12-24 NOTE — H&P (Signed)
 History and Physical    Patient: Mary Moss:[id number] DOB: 08-17-1935 DOA: 12/24/2023 DOS: the patient was seen and examined on 12/24/2023 PCP: Rosslyn Coons, MD  Patient coming from: Home  Chief Complaint:  Chief Complaint  Patient presents with   Shortness of Breath   HPI: AI SONNENFELD is a 88 y.o. female with medical history significant of f hypertension, COPD, GERD, A-fib with RVR, who presents to the emergency department due to 5-day onset of nasal and chest congestion, productive cough of yellow phlegm, generalized weakness.  She noted some improvement with Mucinex  use at home, but symptoms worsened in the last 2 days, so she went to an urgent care today and was found to have low oxygen saturation and was recommended to go to the ED for further evaluation.  She denies fever, nausea, vomiting, abdominal pain, diarrhea.  ED Course:  In the emergency department, she was tachypneic, tachycardic, BP 131/118, O2 sat 94% on supplemental oxygen via [place] at 2 LPM, temperature 98.6 F.  Workup in the ED showed normal CBC except for WBC of 12.0.  BMP showed sodium 134, potassium 3.5, chloride 93, bicarb 27, blood glucose 146, BUN 17, creatinine 1.05, albumin 2.9, lactic acid 3.1 > 1.2,, BNP 137.  Influenza A, B, SARS coronavirus 2, RSV was negative.  Blood culture pending. CT angiography chest with contrast showed no acute pulmonary embolus.  Bilateral lower lobe consolidation suspect for pneumonia. Chest x-ray showed Borderline to mild cardiomegaly with slight central congestion and probable small pleural effusions. Atelectasis or mild infiltrate at the left base. She was treated with IV ceftriaxone , azithromycin .  IV hydration was provided.  TR[facility] was asked to admit patient.   Review of Systems: Review of systems as noted in the HPI. All other systems reviewed and are negative.   Past Medical History:  Diagnosis Date   Ankle fracture fx 13 yrs ago, anklw swells off and on, has  cramps in ankle also   left, to seee dr hewitt about 02-16-14   Arthritis    osteo and RA   Basal cell carcinoma 2015   generalized   Difficult intubation    GERD (gastroesophageal reflux disease)    OTC med   pt. denies   Hypertension    Pneumonia    Pre-diabetes    Past Surgical History:  Procedure Laterality Date   ABDOMINAL HYSTERECTOMY     BIOPSY  01/21/2022   Procedure: BIOPSY;  Surgeon: Umberto Ganong, Bearl Limes, MD;  Location: [facility];  Service: Gastroenterology;;  gastric ulcer biopsies, gastric biopsies;   COLONOSCOPY WITH PROPOFOL  N/A 01/21/2022   Procedure: COLONOSCOPY WITH PROPOFOL ;  Surgeon: Urban Garden, MD;  Location: [facility];  Service: Gastroenterology;  Laterality: N/A;   ESOPHAGOGASTRODUODENOSCOPY (EGD) WITH PROPOFOL  N/A 01/21/2022   Procedure: ESOPHAGOGASTRODUODENOSCOPY (EGD) WITH PROPOFOL ;  Surgeon: Urban Garden, MD;  Location: [facility];  Service: Gastroenterology;  Laterality: N/A;   ESOPHAGOGASTRODUODENOSCOPY (EGD) WITH PROPOFOL  N/A 04/07/2022   Procedure: ESOPHAGOGASTRODUODENOSCOPY (EGD) WITH PROPOFOL ;  Surgeon: Urban Garden, MD;  Location: [facility];  Service: Gastroenterology;  Laterality: N/A;  1100 ASA 3   EYE SURGERY Bilateral    bilaterla cataract extraction with IOL   FRACTURE SURGERY Left 2001   JOINT REPLACEMENT     KNEE ARTHROSCOPY Right 02/07/2014   Procedure: RIGHT ARTHROSCOPY KNEE, WITH MEDIAL MENISCAL DEBRIDEMENT AND CHONDRAPLASTY;  Surgeon: Aurther Blue, MD;  Location: [facility];  Service: Orthopedics;  Laterality: Right;   ROTATOR  CUFF REPAIR Right 2009   TOTAL ANKLE ARTHROPLASTY Left 12/20/2014   Procedure: LEFT TOTAL ANKLE ARTHOPLASTY WITH PERCUTANEOUS ACHILLES TENDON LENGTHENING;  Surgeon: Amada Backer, MD;  Location: [facility];  Service: Orthopedics;  Laterality: Left;   TOTAL HIP ARTHROPLASTY Left 12/31/2021   Procedure: TOTAL HIP ARTHROPLASTY ANTERIOR APPROACH;  Surgeon: Liliane Rei, MD;   Location: [facility];  Service: Orthopedics;  Laterality: Left;   TOTAL KNEE ARTHROPLASTY Right 06/08/2016   Procedure: RIGHT TOTAL KNEE ARTHROPLASTY;  Surgeon: Liliane Rei, MD;  Location: [facility];  Service: Orthopedics;  Laterality: Right;   TOTAL SHOULDER ARTHROPLASTY Right 11/23/2019   Procedure: REVERSE TOTAL SHOULDER ARTHROPLASTY;  Surgeon: Sammye Cristal, MD;  Location: [facility];  Service: Orthopedics;  Laterality: Right;    Social History:  reports that she quit smoking about 16 years ago. Her smoking use included cigarettes. She started smoking about 66 years ago. She has a 25.2 pack-year smoking history. She has been exposed to tobacco smoke. She has never used smokeless tobacco. She reports that she does not currently use alcohol . She reports that she does not use drugs.   Allergies  Allergen Reactions   Statins Other (See Comments)    Simvastatin, crestor, livalo Not able to walk because so much muscle/joint pain   Bacitracin-Polymyxin B Rash and Other (See Comments)   Neosporin [Neomycin-Bacitracin Zn-Polymyx] Rash    Family History  Problem Relation Age of Onset   Cancer Mother        colon   Cancer Father        lung   Cancer Sister        lung cancer-nonsmoker   Alzheimer's disease Brother 80     Prior to Admission medications   Medication Sig Start Date End Date Taking? Authorizing Provider  acetaminophen  (TYLENOL ) 325 MG tablet Take 2 tablets (650 mg total) by mouth every 6 (six) hours as needed for mild pain (or Fever >/= 101). 01/22/22   Emokpae, Courage, MD  Albuterol  Sulfate (PROAIR  RESPICLICK) 108 (90 Base) MCG/ACT AEPB Inhale 1-2 puffs into the lungs every 6 (six) hours as needed (shortness of breath). Patient not taking: Reported on 10/14/2023    [provider]  allopurinol (ZYLOPRIM) 100 MG tablet Take 100 mg by mouth daily.    [provider]  D3-50 1.25 MG (50000 UT) capsule Take 50,000 Units by mouth every Sunday. 10/23/19   [provider]  diphenhydramine -acetaminophen  (TYLENOL  PM EXTRA STRENGTH) 25-500 MG TABS tablet Take 1 tablet by mouth at bedtime.    [provider]  guaiFENesin -dextromethorphan  (ROBITUSSIN DM) 100-10 MG/5ML syrup Take 5 mLs by mouth every 4 (four) hours as needed for cough. 09/22/23   ShahmehdiConstantino Demark, MD  hydrochlorothiazide  (HYDRODIURIL ) 25 MG tablet Take 25 mg by mouth daily.    [provider]  hydroxychloroquine  (PLAQUENIL ) 200 MG tablet Take 400 mg by mouth 2 (two) times daily. 11/25/21   [provider]  ipratropium (ATROVENT ) 0.02 % nebulizer solution Take 2.5 mLs (0.5 mg total) by nebulization 3 (three) times daily. Patient not taking: Reported on 10/14/2023 09/22/23   Bobbetta Burnet, MD  levalbuterol  (XOPENEX ) 0.63 MG/3ML nebulizer solution Take 3 mLs (0.63 mg total) by nebulization every 6 (six) hours as needed for wheezing or shortness of breath. Patient not taking: Reported on 10/14/2023 09/22/23   Bobbetta Burnet, MD  Metoprolol  Tartrate 37.5 MG TABS Take 1 tablet (37.5 mg total) by mouth 2 (two) times daily. 10/14/23 10/08/24  Dorma Gash,  PA-C  pantoprazole  (PROTONIX ) 40 MG tablet Take 1 tablet (40 mg total) by mouth daily. 04/07/22   Urban Garden, MD    Physical Exam: BP (!) 120/45   Pulse 100   Temp 98.6 F (37 C)   Resp 19   Ht 5\' 8"  (1.727 m)   SpO2 93%   BMI 31.54 kg/m   General: 88 y.o. year-old female well developed well nourished in no acute distress.  Alert and oriented x3. HEENT: NCAT, EOMI Neck: Supple, trachea medial Cardiovascular: Tachycardia.  Regular rate and rhythm with no rubs or gallops.  No thyromegaly or JVD noted.  No lower extremity edema. 2/4 pulses in all 4 extremities. Respiratory: Tachypnea.  Diffuse coarse breath sounds with expiratory wheezes on auscultation. Abdomen: Soft, nontender nondistended with normal bowel sounds x4 quadrants. Muskuloskeletal: No cyanosis, clubbing or edema noted  bilaterally Neuro: CN II-XII intact, strength 5/5 x 4, sensation, reflexes intact Skin: No ulcerative lesions noted or rashes Psychiatry: Judgement and insight appear normal. Mood is appropriate for condition and setting          Labs on Admission:  Basic Metabolic Panel: Recent Labs  Lab 12/24/23 1743  NA 134*  K 3.5  CL 93*  CO2 27  GLUCOSE 146*  BUN 17  CREATININE 1.05*  CALCIUM 8.6*   Liver Function Tests: Recent Labs  Lab 12/24/23 1743  AST 22  ALT 16  ALKPHOS 88  BILITOT 1.2  PROT 7.0  ALBUMIN 2.9*   No results for input(s): "LIPASE", "AMYLASE" in the last 168 hours. No results for input(s): "AMMONIA" in the last 168 hours. CBC: Recent Labs  Lab 12/24/23 1743  WBC 12.0*  NEUTROABS 9.8*  HGB 12.9  HCT 40.7  MCV 95.3  PLT 238   Cardiac Enzymes: No results for input(s): "CKTOTAL", "CKMB", "CKMBINDEX", "TROPONINI" in the last 168 hours.  BNP (last 3 results) Recent Labs    09/16/23 0858 12/24/23 1743  BNP 57.0 137.0*    ProBNP (last 3 results) No results for input(s): "PROBNP" in the last 8760 hours.  CBG: No results for input(s): "GLUCAP" in the last 168 hours.  Radiological Exams on Admission: CT Angio Chest PE W and/or Wo Contrast Result Date: 12/24/2023 CLINICAL DATA:  Shortness of breath cough EXAM: CT ANGIOGRAPHY CHEST WITH CONTRAST TECHNIQUE: Multidetector CT imaging of the chest was performed using the standard protocol during bolus administration of intravenous contrast. Multiplanar CT image reconstructions and MIPs were obtained to evaluate the vascular anatomy. RADIATION DOSE REDUCTION: This exam was performed according to the departmental dose-optimization program which includes automated exposure control, adjustment of the mA and/or kV according to patient size and/or use of iterative reconstruction technique. CONTRAST:  75mL OMNIPAQUE  IOHEXOL  350 MG/ML SOLN COMPARISON:  Chest x-ray 12/24/2023 FINDINGS: Cardiovascular: Satisfactory  opacification of the pulmonary arteries to the segmental level. No evidence of pulmonary embolism. Mild aortic atherosclerosis. No aneurysm or dissection. Normal cardiac size. No pericardial effusion Mediastinum/Nodes: Patent trachea. No thyroid  mass. No suspicious lymph nodes. Esophagus within normal limits. Lungs/Pleura: Bilateral lower lobe consolidations. No pleural effusion or pneumothorax Upper Abdomen: No acute finding Musculoskeletal: No acute osseous abnormality. Review of the MIP images confirms the above findings. IMPRESSION: 1. Negative for acute pulmonary embolus. 2. Bilateral lower lobe consolidations suspect for pneumonia. Imaging follow-up to resolution is recommended. 3. Aortic atherosclerosis. Aortic Atherosclerosis (ICD10-I70.0). Electronically Signed   By: Esmeralda Hedge M.D.   On: 12/24/2023 21:05   DG Chest Port 1 View Result Date: 12/24/2023  CLINICAL DATA:  Shortness of breath EXAM: PORTABLE CHEST 1 VIEW COMPARISON:  09/20/2023, 11/29/2023 FINDINGS: Right shoulder replacement. Borderline to mild cardiomegaly with slight central congestion. Mild atelectasis or infiltrate at the left base. Probable small pleural effusions. No pneumothorax IMPRESSION: Borderline to mild cardiomegaly with slight central congestion and probable small pleural effusions. Atelectasis or mild infiltrate at the left base. Electronically Signed   By: Esmeralda Hedge M.D.   On: 12/24/2023 19:24    EKG: I independently viewed the EKG done and my findings are as followed: Normal sinus rhythm at a rate of 98 bpm  Assessment/Plan Present on Admission:  CAP (community acquired pneumonia)  GERD (gastroesophageal reflux disease)  Essential hypertension  Atrial fibrillation with RVR (HCC)  Principal Problem:   CAP (community acquired pneumonia) Active Problems:   Essential hypertension   GERD (gastroesophageal reflux disease)   Atrial fibrillation with RVR (HCC)   Severe sepsis (HCC)   Lactic acidosis   Acute  exacerbation of chronic obstructive pulmonary disease (COPD) (HCC)   Hypoalbuminemia due to protein-calorie malnutrition (HCC)   Gout  Severe sepsis due to pneumonia  Acute respiratory failure with hypoxia Patient met sepsis criteria due to being tachypneic, tachycardic, WBC of 12.0 and lactic acid at 3.1 CT angiography of chest was suggestive of bilateral lower lobe pneumonia Patient was started on ceftriaxone  and azithromycin , we shall continue same at this time with plan to de-escalate/discontinue based on blood culture, sputum culture, urine Legionella, strep pneumo and procalcitonin Continue Tylenol  as needed Continue Mucinex , Robitussin, incentive spirometry, flutter valve  Continue supplemental oxygen to maintain O2 sat > 94% with plan to wean patient off oxygen as tolerated (patient does not use oxygen at baseline).  Lactic acidosis-resolved  Acute exacerbation of COPD Continue Xopenex , Mucinex , Solu-Medrol , azithromycin . Continue Protonix  to prevent steroid-induced ulcer Continue incentive spirometry and flutter valve  Hypoalbuminemia possibly secondary to moderate protein calorie malnutrition Albumin 2.9, protein supplement will be provided  GERD Patient has a history of PUD Continue pantoprazole   Essential hypertension Continue hydrochlorothiazide  and metoprolol     Gout Continue allopurinol   History of atrial fibrillation with RVR This has since resolved EKG personally reviewed showed normal sinus rhythm at rate of 98 bpm Patient was already taking off Eliquis  by her cardiologist  DVT prophylaxis: Lovenox   Code Status: Full code  Family Communication: None at bedside  Consults: None  Severity of Illness: The appropriate patient status for this patient is INPATIENT. Inpatient status is judged to be reasonable and necessary in order to provide the required intensity of service to ensure the patient's safety. The patient's presenting symptoms, physical exam  findings, and initial radiographic and laboratory data in the context of their chronic comorbidities is felt to place them at high risk for further clinical deterioration. Furthermore, it is not anticipated that the patient will be medically stable for discharge from the hospital within 2 midnights of admission.   * I certify that at the point of admission it is my clinical judgment that the patient will require inpatient hospital care spanning beyond 2 midnights from the point of admission due to high intensity of service, high risk for further deterioration and high frequency of surveillance required.*  Author: Carrol Bondar, DO 12/24/2023 10:59 PM  For on call review [email or web address].

## 2023-12-24 NOTE — ED Provider Notes (Signed)
 [facility] AT [facility] Provider Note   CSN: [id number] Arrival date & time: 12/24/23  1647     History  Chief Complaint  Patient presents with   Shortness of Breath    Mary Moss is a 88 y.o. female.  She has a history of COPD, A-fib.  She said she has been sick for about a week with nasal congestion chest congestion productive cough low-grade fevers and feeling generally weak.  She has been using Mucinex  and resting with some improvement.  Symptoms acutely got worse 2 days ago.  She went to urgent care today and they found her oxygen saturations to be low and recommended she come to the hospital.  No nausea vomiting diarrhea.  No urinary symptoms.  No sick contacts or recent travel.  The history is provided by the patient.  Shortness of Breath Severity:  Moderate Onset quality:  Gradual Duration:  1 week Timing:  Constant Progression:  Worsening Chronicity:  New Relieved by:  Nothing Worsened by:  Activity and coughing Associated symptoms: cough, fever and sputum production   Associated symptoms: no abdominal pain, no chest pain and no hemoptysis   Risk factors: no tobacco use        Home Medications Prior to Admission medications   Medication Sig Start Date End Date Taking? Authorizing Provider  acetaminophen  (TYLENOL ) 325 MG tablet Take 2 tablets (650 mg total) by mouth every 6 (six) hours as needed for mild pain (or Fever >/= 101). 01/22/22   Emokpae, Courage, MD  Albuterol  Sulfate (PROAIR  RESPICLICK) 108 (90 Base) MCG/ACT AEPB Inhale 1-2 puffs into the lungs every 6 (six) hours as needed (shortness of breath). Patient not taking: Reported on 10/14/2023    [provider]  allopurinol  (ZYLOPRIM ) 100 MG tablet Take 100 mg by mouth daily.    [provider]  D3-50 1.25 MG (50000 UT) capsule Take 50,000 Units by mouth every Sunday. 10/23/19   [provider]  diphenhydramine -acetaminophen  (TYLENOL  PM EXTRA  STRENGTH) 25-500 MG TABS tablet Take 1 tablet by mouth at bedtime.    [provider]  guaiFENesin -dextromethorphan  (ROBITUSSIN DM) 100-10 MG/5ML syrup Take 5 mLs by mouth every 4 (four) hours as needed for cough. 09/22/23   Bobbetta Burnet, MD  hydrochlorothiazide  (HYDRODIURIL ) 25 MG tablet Take 25 mg by mouth daily.    [provider]  hydroxychloroquine  (PLAQUENIL ) 200 MG tablet Take 400 mg by mouth 2 (two) times daily. 11/25/21   [provider]  ipratropium (ATROVENT ) 0.02 % nebulizer solution Take 2.5 mLs (0.5 mg total) by nebulization 3 (three) times daily. Patient not taking: Reported on 10/14/2023 09/22/23   Bobbetta Burnet, MD  levalbuterol  (XOPENEX ) 0.63 MG/3ML nebulizer solution Take 3 mLs (0.63 mg total) by nebulization every 6 (six) hours as needed for wheezing or shortness of breath. Patient not taking: Reported on 10/14/2023 09/22/23   Bobbetta Burnet, MD  Metoprolol  Tartrate 37.5 MG TABS Take 1 tablet (37.5 mg total) by mouth 2 (two) times daily. 10/14/23 10/08/24  Strader, Dimple Francis, PA-C  pantoprazole  (PROTONIX ) 40 MG tablet Take 1 tablet (40 mg total) by mouth daily. 04/07/22   Urban Garden, MD      Allergies    Statins, Bacitracin-polymyxin b, and Neosporin [neomycin-bacitracin zn-polymyx]    Review of Systems   Review of Systems  Constitutional:  Positive for fever.  Respiratory:  Positive for cough, sputum production and shortness of breath. Negative for hemoptysis.   Cardiovascular:  Negative for chest pain.  Gastrointestinal:  Negative for abdominal pain.    Physical Exam Updated Vital Signs BP (!) 115/57   Pulse (!) 104   Temp 98.6 F (37 C)   Resp (!) 24   Ht 5\' 8"  (1.727 m)   SpO2 (!) 89%   BMI 31.54 kg/m  Physical Exam Vitals and nursing note reviewed.  Constitutional:      General: She is not in acute distress.    Appearance: Normal appearance. She is well-developed.  HENT:     Head: Normocephalic and  atraumatic.  Eyes:     Conjunctiva/sclera: Conjunctivae normal.  Cardiovascular:     Rate and Rhythm: Regular rhythm. Tachycardia present.     Heart sounds: No murmur heard. Pulmonary:     Effort: Accessory muscle usage present. No respiratory distress.     Breath sounds: No stridor. Rhonchi present. No wheezing.  Abdominal:     Palpations: Abdomen is soft.     Tenderness: There is no abdominal tenderness. There is no guarding or rebound.  Musculoskeletal:        General: No tenderness or deformity. Normal range of motion.     Cervical back: Neck supple.     Right lower leg: No tenderness. No edema.     Left lower leg: No tenderness. No edema.  Skin:    General: Skin is warm and dry.  Neurological:     General: No focal deficit present.     Mental Status: She is alert.     GCS: GCS eye subscore is 4. GCS verbal subscore is 5. GCS motor subscore is 6.     ED Results / Procedures / Treatments   Labs (all labs ordered are listed, but only abnormal results are displayed) Labs Reviewed  CBC WITH DIFFERENTIAL/PLATELET - Abnormal; Notable for the following components:      Result Value   WBC 12.0 (*)    Neutro Abs 9.8 (*)    Abs Immature Granulocytes 0.09 (*)    All other components within normal limits  COMPREHENSIVE METABOLIC PANEL WITH GFR - Abnormal; Notable for the following components:   Sodium 134 (*)    Chloride 93 (*)    Glucose, Bld 146 (*)    Creatinine, Ser 1.05 (*)    Calcium 8.6 (*)    Albumin 2.9 (*)    GFR, Estimated 52 (*)    All other components within normal limits  LACTIC ACID, PLASMA - Abnormal; Notable for the following components:   Lactic Acid, Venous 3.1 (*)    All other components within normal limits  BRAIN NATRIURETIC PEPTIDE - Abnormal; Notable for the following components:   B Natriuretic Peptide 137.0 (*)    All other components within normal limits  COMPREHENSIVE METABOLIC PANEL WITH GFR - Abnormal; Notable for the following components:    Potassium 3.0 (*)    Glucose, Bld 103 (*)    Calcium 7.9 (*)    Total Protein 5.8 (*)    Albumin 2.4 (*)    All other components within normal limits  CBC - Abnormal; Notable for the following components:   WBC 11.4 (*)    RBC 3.63 (*)    Hemoglobin 11.3 (*)    HCT 34.9 (*)    All other components within normal limits  MAGNESIUM  - Abnormal; Notable for the following components:   Magnesium  1.5 (*)    All other components within normal limits  PHOSPHORUS - Abnormal; Notable for the following components:  Phosphorus 2.0 (*)    All other components within normal limits  RESP PANEL BY RT-PCR (RSV, FLU A&B, COVID)  RVPGX2  CULTURE, BLOOD (ROUTINE X 2)  CULTURE, BLOOD (ROUTINE X 2)  EXPECTORATED SPUTUM ASSESSMENT W GRAM STAIN, RFLX TO RESP C  RESPIRATORY PANEL BY PCR  MRSA NEXT GEN BY PCR, NASAL  LACTIC ACID, PLASMA  LEGIONELLA PNEUMOPHILA SEROGP 1 UR AG  STREP PNEUMONIAE URINARY ANTIGEN    EKG EKG Interpretation Date/Time:  Friday Dec 24 2023 17:45:31 EDT Ventricular Rate:  98 PR Interval:  187 QRS Duration:  87 QT Interval:  359 QTC Calculation: 459 R Axis:   26  Text Interpretation: Sinus rhythm Minimal ST depression, lateral leads No significant change since prior 3/25 Confirmed by Racheal Buddle [phone]) on 12/24/2023 5:47:05 PM  Radiology CT Angio Chest PE W and/or Wo Contrast Result Date: 12/24/2023 CLINICAL DATA:  Shortness of breath cough EXAM: CT ANGIOGRAPHY CHEST WITH CONTRAST TECHNIQUE: Multidetector CT imaging of the chest was performed using the standard protocol during bolus administration of intravenous contrast. Multiplanar CT image reconstructions and MIPs were obtained to evaluate the vascular anatomy. RADIATION DOSE REDUCTION: This exam was performed according to the departmental dose-optimization program which includes automated exposure control, adjustment of the mA and/or kV according to patient size and/or use of iterative reconstruction technique.  CONTRAST:  75mL OMNIPAQUE  IOHEXOL  350 MG/ML SOLN COMPARISON:  Chest x-ray 12/24/2023 FINDINGS: Cardiovascular: Satisfactory opacification of the pulmonary arteries to the segmental level. No evidence of pulmonary embolism. Mild aortic atherosclerosis. No aneurysm or dissection. Normal cardiac size. No pericardial effusion Mediastinum/Nodes: Patent trachea. No thyroid  mass. No suspicious lymph nodes. Esophagus within normal limits. Lungs/Pleura: Bilateral lower lobe consolidations. No pleural effusion or pneumothorax Upper Abdomen: No acute finding Musculoskeletal: No acute osseous abnormality. Review of the MIP images confirms the above findings. IMPRESSION: 1. Negative for acute pulmonary embolus. 2. Bilateral lower lobe consolidations suspect for pneumonia. Imaging follow-up to resolution is recommended. 3. Aortic atherosclerosis. Aortic Atherosclerosis (ICD10-I70.0). Electronically Signed   By: Esmeralda Hedge M.D.   On: 12/24/2023 21:05   DG Chest Port 1 View Result Date: 12/24/2023 CLINICAL DATA:  Shortness of breath EXAM: PORTABLE CHEST 1 VIEW COMPARISON:  09/20/2023, 11/29/2023 FINDINGS: Right shoulder replacement. Borderline to mild cardiomegaly with slight central congestion. Mild atelectasis or infiltrate at the left base. Probable small pleural effusions. No pneumothorax IMPRESSION: Borderline to mild cardiomegaly with slight central congestion and probable small pleural effusions. Atelectasis or mild infiltrate at the left base. Electronically Signed   By: Esmeralda Hedge M.D.   On: 12/24/2023 19:24    Procedures .Critical Care  Performed by: Tonya Fredrickson, MD Authorized by: Tonya Fredrickson, MD   Critical care provider statement:    Critical care time (minutes):  45   Critical care time was exclusive of:  Separately billable procedures and treating other patients   Critical care was necessary to treat or prevent imminent or life-threatening deterioration of the following conditions:   Sepsis and respiratory failure   Critical care was time spent personally by me on the following activities:  Development of treatment plan with patient or surrogate, discussions with consultants, evaluation of patient's response to treatment, examination of patient, obtaining history from patient or surrogate, ordering and performing treatments and interventions, ordering and review of laboratory studies, ordering and review of radiographic studies, pulse oximetry, re-evaluation of patient's condition and review of old charts   I assumed direction of critical care for this patient from  another provider in my specialty: no       Medications Ordered in ED Medications  feeding supplement (ENSURE PLUS HIGH PROTEIN) liquid 237 mL (237 mLs Oral Given 12/25/23 0825)  cefTRIAXone  (ROCEPHIN ) 2 g in sodium chloride  0.9 % 100 mL IVPB (has no administration in time range)  azithromycin  (ZITHROMAX ) 500 mg in sodium chloride  0.9 % 250 mL IVPB (has no administration in time range)  enoxaparin  (LOVENOX ) injection 40 mg (40 mg Subcutaneous Given 12/25/23 0823)  acetaminophen  (TYLENOL ) tablet 650 mg (has no administration in time range)    Or  acetaminophen  (TYLENOL ) suppository 650 mg (has no administration in time range)  ondansetron  (ZOFRAN ) tablet 4 mg (has no administration in time range)    Or  ondansetron  (ZOFRAN ) injection 4 mg (has no administration in time range)  dextromethorphan -guaiFENesin  (MUCINEX  DM) 30-600 MG per 12 hr tablet 1 tablet (1 tablet Oral Given 12/25/23 0823)  guaiFENesin -dextromethorphan  (ROBITUSSIN DM) 100-10 MG/5ML syrup 5 mL (has no administration in time range)  allopurinol  (ZYLOPRIM ) tablet 100 mg (100 mg Oral Given 12/25/23 0823)  pantoprazole  (PROTONIX ) EC tablet 40 mg (40 mg Oral Given 12/25/23 0823)  metoprolol  tartrate (LOPRESSOR ) tablet 37.5 mg (37.5 mg Oral Given 12/25/23 0823)  levalbuterol  (XOPENEX ) nebulizer solution 0.63 mg (0.63 mg Nebulization Given 12/25/23 0720)   methylPREDNISolone  sodium succinate (SOLU-MEDROL ) 40 mg/mL injection 40 mg (40 mg Intravenous Given 12/25/23 0820)  ipratropium (ATROVENT ) nebulizer solution 0.5 mg (0.5 mg Nebulization Given 12/25/23 0720)  budesonide  (PULMICORT ) nebulizer solution 0.5 mg (0.5 mg Nebulization Given 12/25/23 0724)  arformoterol  (BROVANA ) nebulizer solution 15 mcg (15 mcg Nebulization Given by Other 12/25/23 0727)  potassium PHOSPHATE 30 mmol in dextrose  5 % 500 mL infusion (30 mmol Intravenous New Bag/Given 12/25/23 0949)  sodium chloride  0.9 % bolus 1,000 mL (0 mLs Intravenous Stopped 12/24/23 2149)  cefTRIAXone  (ROCEPHIN ) 1 g in sodium chloride  0.9 % 100 mL IVPB (0 g Intravenous Stopped 12/24/23 2148)  azithromycin  (ZITHROMAX ) 500 mg in sodium chloride  0.9 % 250 mL IVPB (0 mg Intravenous Stopped 12/24/23 2230)  iohexol  (OMNIPAQUE ) 350 MG/ML injection 75 mL (75 mLs Intravenous Contrast Given 12/24/23 1946)  magnesium  sulfate IVPB 2 g 50 mL (2 g Intravenous New Bag/Given 12/25/23 0828)  potassium chloride  SA (KLOR-CON  M) CR tablet 20 mEq (20 mEq Oral Given 12/25/23 4098)    ED Course/ Medical Decision Making/ A&P Clinical Course as of 12/25/23 1014  Fri Dec 24, 2023  1756 Chest x-ray and blood by me as possible atelectasis left base increased interstitial markings.  Awaiting radiology reading. [MB]  2132 Reviewed results with patient and she is agreeable to plan for admission.  Discussed with [facility] hospitalist Dr. Adefeso who will evaluate patient for admission. [MB]    Clinical Course User Index [MB] Tonya Fredrickson, MD                                 Medical Decision Making Amount and/or Complexity of Data Reviewed Labs: ordered. Radiology: ordered.  Risk Prescription drug management. Decision regarding hospitalization.   This patient complains of weakness cough; this involves an extensive number of treatment Options and is a complaint that carries with it a high risk of complications and morbidity.  The differential includes pneumonia, COVID, flu, pneumothorax, PE  I ordered, reviewed and interpreted labs, which included CBC with elevated white count, lactate elevated, blood culture sent, BNP mildly elevated, COVID and flu negative I ordered  medication IV fluids IV antibiotics and reviewed PMP when indicated. I ordered imaging studies which included chest x-ray and CT angio chest and I independently    visualized and interpreted imaging which showed no PE but does have bilateral lower lobe infiltrates Additional history obtained from patient's companion Previous records obtained and reviewed in epic including recent cardiology and urgent care notes I consulted [facility] hospitalist Dr. Elyse Hand and discussed lab and imaging findings and discussed disposition.  Cardiac monitoring reviewed, sinus rhythm Social determinants considered, no significant barriers Critical Interventions: Initiation of oxygen and treatment for possible sepsis with goal-directed antibiotics and fluids  After the interventions stated above, I reevaluated the patient and found patient to be feeling reasonably well on oxygen Admission and further testing considered, she would benefit from mission to the hospital for continued treatment with oxygen and antibiotics, fluids.  She is in agreement with plan for admission         Final Clinical Impression(s) / ED Diagnoses Final diagnoses:  Community acquired pneumonia, unspecified laterality  Acute respiratory failure with hypoxia [facility])    Rx / DC Orders ED Discharge Orders     None         Tonya Fredrickson, MD 12/25/23 1017

## 2023-12-25 DIAGNOSIS — J181 Lobar pneumonia, unspecified organism: Secondary | ICD-10-CM

## 2023-12-25 LAB — BLOOD CULTURE ID PANEL (REFLEXED) - BCID2

## 2023-12-25 LAB — CBC
HCT: 34.9 % — ABNORMAL LOW (ref 36.0–46.0)
Hemoglobin: 11.3 g/dL — ABNORMAL LOW (ref 12.0–15.0)
MCH: 31.1 pg (ref 26.0–34.0)
MCHC: 32.4 g/dL (ref 30.0–36.0)
MCV: 96.1 fL (ref 80.0–100.0)
Platelets: 230 10*3/uL (ref 150–400)
RBC: 3.63 MIL/uL — ABNORMAL LOW (ref 3.87–5.11)
RDW: 13.5 % (ref 11.5–15.5)
WBC: 11.4 10*3/uL — ABNORMAL HIGH (ref 4.0–10.5)
nRBC: 0 % (ref 0.0–0.2)

## 2023-12-25 LAB — RESPIRATORY PANEL BY PCR

## 2023-12-25 LAB — COMPREHENSIVE METABOLIC PANEL WITH GFR
ALT: 12 U/L (ref 0–44)
AST: 18 U/L (ref 15–41)
Albumin: 2.4 g/dL — ABNORMAL LOW (ref 3.5–5.0)
Alkaline Phosphatase: 75 U/L (ref 38–126)
Anion gap: 10 (ref 5–15)
BUN: 14 mg/dL (ref 8–23)
CO2: 25 mmol/L (ref 22–32)
Calcium: 7.9 mg/dL — ABNORMAL LOW (ref 8.9–10.3)
Chloride: 101 mmol/L (ref 98–111)
Creatinine, Ser: 0.8 mg/dL (ref 0.44–1.00)
GFR, Estimated: >60 mL/min (ref >60)
Glucose, Bld: 103 mg/dL — ABNORMAL HIGH (ref 70–99)
Potassium: 3 mmol/L — ABNORMAL LOW (ref 3.5–5.1)
Sodium: 136 mmol/L (ref 135–145)
Total Bilirubin: 1.1 mg/dL (ref 0.0–1.2)
Total Protein: 5.8 g/dL — ABNORMAL LOW (ref 6.5–8.1)

## 2023-12-25 LAB — MAGNESIUM: Magnesium: 1.5 mg/dL — ABNORMAL LOW (ref 1.7–2.4)

## 2023-12-25 LAB — PHOSPHORUS: Phosphorus: 2 mg/dL — ABNORMAL LOW (ref 2.5–4.6)

## 2023-12-25 LAB — MRSA NEXT GEN BY PCR, NASAL: MRSA by PCR Next Gen: NOT DETECTED

## 2023-12-25 LAB — STREP PNEUMONIAE URINARY ANTIGEN: Strep Pneumo Urinary Antigen: NEGATIVE

## 2023-12-25 MED ORDER — IPRATROPIUM BROMIDE 0.02 % IN SOLN
0.5000 mg | Freq: Four times a day (QID) | RESPIRATORY_TRACT | Status: AC
  Administered 2023-12-25 – 2023-12-26 (×6): 0.5 mg via RESPIRATORY_TRACT
  Filled 2023-12-25 (×6): qty 2.5

## 2023-12-25 MED ORDER — MAGNESIUM SULFATE 2 GM/50ML IV SOLN
2.0000 g | Freq: Once | INTRAVENOUS | Status: AC
  Administered 2023-12-25: 2 g via INTRAVENOUS
  Filled 2023-12-25: qty 50

## 2023-12-25 MED ORDER — POTASSIUM PHOSPHATES 15 MMOLE/5ML IV SOLN
30.0000 mmol | Freq: Once | INTRAVENOUS | Status: AC
  Administered 2023-12-25: 30 mmol via INTRAVENOUS
  Filled 2023-12-25: qty 10

## 2023-12-25 MED ORDER — POTASSIUM CHLORIDE CRYS ER 20 MEQ PO TBCR
20.0000 meq | EXTENDED_RELEASE_TABLET | Freq: Once | ORAL | Status: AC
  Administered 2023-12-25: 20 meq via ORAL
  Filled 2023-12-25: qty 1

## 2023-12-25 MED ORDER — BUDESONIDE 0.5 MG/2ML IN SUSP
0.5000 mg | Freq: Two times a day (BID) | RESPIRATORY_TRACT | Status: DC
  Administered 2023-12-25 – 2023-12-27 (×5): 0.5 mg via RESPIRATORY_TRACT
  Filled 2023-12-25 (×5): qty 2

## 2023-12-25 MED ORDER — ARFORMOTEROL TARTRATE 15 MCG/2ML IN NEBU
15.0000 ug | INHALATION_SOLUTION | Freq: Two times a day (BID) | RESPIRATORY_TRACT | Status: DC
  Administered 2023-12-25 – 2023-12-27 (×5): 15 ug via RESPIRATORY_TRACT
  Filled 2023-12-25 (×5): qty 2

## 2023-12-25 NOTE — TOC Initial Note (Signed)
 Transition of Care [facility]) - Initial/Assessment Note    Patient Details  Name: Mary Moss MRN: [id number] Date of Birth: September 27, 1935  Transition of Care [facility]) CM/SW Contact:    Lynda Sands, RN Phone Number: 12/25/2023, 6:12 PM  Clinical Narrative:     CM met with patient at bedside. Patient lives alone . Patient has family support, daughters, and grandchildren support her. Patient drives. Patient owns DME: shower chair, bedside commode, and  rolling walker.   Patient plan to be discharge  home without  any services.       Expected Discharge Plan: Home/Self Care Barriers to Discharge: Continued Medical Work up   Patient Goals and CMS Choice Patient states their goals for this hospitalization and ongoing recovery are:: Discharge home CMS Medicare.gov Compare Post Acute Care list provided to:: Patient     Expected Discharge Plan and Services      Living arrangements for the past 2 months: Single Family Home                  Prior Living Arrangements/Services Living arrangements for the past 2 months: Single Family Home Lives with:: Self Patient language and need for interpreter reviewed:: No        Need for Family Participation in Patient Care: Yes (Comment) Care giver support system in place?: Yes (comment)   Criminal Activity/Legal Involvement Pertinent to Current Situation/Hospitalization: No - Comment as needed  Activities of Daily Living   ADL Screening (condition at time of admission) Independently performs ADLs?: Yes (appropriate for developmental age) Is the patient deaf or have difficulty hearing?: No Does the patient have difficulty seeing, even when wearing glasses/contacts?: No Does the patient have difficulty concentrating, remembering, or making decisions?: No  Permission Sought/Granted      Share Information with NAME: Mary Moss     Permission granted to share info w Relationship: Daughter  Permission granted to share info w Contact  Information: [phone]  Emotional Assessment Appearance:: Appears stated age Attitude/Demeanor/Rapport: Engaged Affect (typically observed): Appropriate Orientation: : Oriented to Self, Oriented to Situation, Oriented to Place, Oriented to  Time Alcohol  / Substance Use: Tobacco Use    Admission diagnosis:  CAP (community acquired pneumonia) [J18.9] Patient Active Problem List   Diagnosis Date Noted   Lobar pneumonia (HCC) 12/25/2023   CAP (community acquired pneumonia) 12/24/2023   Severe sepsis (HCC) 12/24/2023   Lactic acidosis 12/24/2023   Acute exacerbation of chronic obstructive pulmonary disease (COPD) (HCC) 12/24/2023   Hypoalbuminemia due to protein-calorie malnutrition (HCC) 12/24/2023   Gout 12/24/2023   Atrial fibrillation with RVR (HCC) 09/16/2023   Influenza A 09/15/2023   SIRS (systemic inflammatory response syndrome) (HCC) 09/15/2023   Acute respiratory failure with hypoxia (HCC) 09/15/2023   Atelectasis 09/15/2023   GERD (gastroesophageal reflux disease) 09/15/2023   Gastric ulcer 02/23/2022   Symptomatic anemia 01/21/2022   S/P total left hip arthroplasty 01/20/2022   Seropositive rheumatoid arthritis (HCC) 01/20/2022   Osteoarthritis of left hip 12/31/2021   Degeneration of lumbar intervertebral disc 08/09/2021   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 [place] [address] Phone: [phone] Fax:  [phone]     Social Drivers of Health (SDOH) Social History: SDOH Screenings   Food Insecurity: No Food Insecurity (12/24/2023)  Housing: Low Risk  (12/24/2023)  Transportation Needs: No Transportation Needs (12/24/2023)  Utilities: Not At Risk (12/24/2023)  Depression (PHQ2-9): Medium Risk (01/30/2022)  Financial Resource Strain: Low Risk  (09/13/2019)  Physical Activity: Inactive (09/13/2019)  Social Connections: Moderately Integrated (12/24/2023)  Stress: No Stress Concern Present (09/13/2019)  Tobacco Use: Medium Risk (12/24/2023)   SDOH Interventions:     Readmission Risk Interventions    09/22/2023   10:50 AM  Readmission Risk Prevention Plan  Post Dischage Appt Complete  Medication Screening Complete

## 2023-12-25 NOTE — Hospital Course (Addendum)
 88 y/o female with history of hypertension, GERD/PUD, CKD stage III, seropositive rheumatoid arthritis, hyperlipidemia, impaired glucose tolerance, and vitamin D  deficiency presenting with 2-day history of generalized weakness, nonproductive cough, nasal congestion,  myalgias, arthralgias, and fevers and chills. She went to  urgent care about 3 weeks before this admission and was placed on an abx and prednisone  and she improved.  However, her symptoms worsened again 2 days prior to admission.  She went to urgent care again and she was found to be hypoxic and sent to the ED for further evaluation.   She denies cp, hemoptysis, n/v/d, abd pain, dysuria, headache.  In the ED she had a low grade temp of 99.2, but was hemodynamically stable.  Her oxygen saturation was 89% on RA and she was placed on 2L with saturation 94-96%.  She was given IV fluids and started on ceftriaxone  and azithromycin .  CTA chest showed bilateral LL consolidation without edema or effusions.    The patient was started on bronchodilators IV Solu-Medrol .  She was continued on ceftriaxone  and azithromycin .  She improved clinically.  On day of discharge, ambulatory pulse ox on room air showed desaturation down to 81%.  She was discharged home on 2 L nasal cannula.

## 2023-12-25 NOTE — Plan of Care (Signed)

## 2023-12-25 NOTE — Progress Notes (Signed)
   12/25/23 0617  Vitals  Temp 98 F (36.7 C)  Temp Source Oral  BP (!) 118/48  MAP (mmHg) 67  BP Location Left Arm  BP Method Automatic  Patient Position (if appropriate) Lying  Pulse Rate 94  Pulse Rate Source Monitor  Resp 17  MEWS COLOR  MEWS Score Color Green  Oxygen Therapy  SpO2 93 %  O2 Device Nasal Cannula  O2 Flow Rate (L/min) 3 L/min  MEWS Score  MEWS Temp 0  MEWS Systolic 0  MEWS Pulse 0  MEWS RR 0  MEWS LOC 0  MEWS Score 0   Dr. Elyse Hand made aware of BP

## 2023-12-25 NOTE — Progress Notes (Signed)
 PROGRESS NOTE  Mary Moss JYN:[id number] DOB: 01/06/1936 DOA: 12/24/2023 PCP: Rosslyn Coons, MD  Brief History:  88 y/o female with history of hypertension, GERD/PUD, CKD stage III, seropositive rheumatoid arthritis, hyperlipidemia, impaired glucose tolerance, and vitamin D  deficiency presenting with 2-day history of generalized weakness, nonproductive cough, nasal congestion,  myalgias, arthralgias, and fevers and chills. She went to  urgent care about 3 weeks before this admission and was placed on an abx and prednisone  and she improved.  However, her symptoms worsened again 2 days prior to admission.  She went to urgent care again and she was found to be hypoxic and sent to the ED for further evaluation.   She denies cp, hemoptysis, n/v/d, abd pain, dysuria, headache.  In the ED she had a low grade temp of 99.2, but was hemodynamically stable.  Her oxygen saturation was 89% on RA and she was placed on 2L with saturation 94-96%.  She was given IV fluids and started on ceftriaxone  and azithromycin .  CTA chest showed bilateral LL consolidation without edema or effusions.     Assessment/Plan: Severe Sepsis -due to pneumonia -presented with tachycardia, leukocytosis -lactic peaked 3.1 -continue ceftriaxone  and azithro -continue IVF  Acute respiratory failure with hypoxia -Secondary to pneumonia -Initially placed on 2L -Wean oxygen for saturation greater 90%   Lobar Pneumonia/COPD Exacerbation -she has 40 pack year hx of tobacco, quit 16 yr ago -continue xopenex  -added atrovent  -sttart pulmicort  -start brovana  -continue IV solumedrol -5/30 CTA chest as discussed above -continue ceftriaxone  and azithro   Paroxysmal atrial fibrillation -developed afib her last hospitalization 08/2023 -had 30 day monitor without recurrent afib 10/26/23 -apixaban  stopped -remains in sinus   GERD -hx of PUD -continue pantoprazole    Seropositive RA -receives golinumab every 2  months--in the past week -restart leflunomide  and hydroxychloroquine    Essential HTN -holding lisinopril  and hydrochlorothiazide  to allow BP margin -restart metoprolol  tartrate -BP controlled   Gout -restart allopurinol   Hypokalemia/hypomagnesemia/hypophosphatemia -replete           Family Communication:  no  Family at bedside  Consultants:  none  Code Status:  FULL   DVT Prophylaxis: [place] Lovenox    Procedures: As Listed in Progress Note Above  Antibiotics: Ceftriaxone  5/30>> Azithro 5/30>>     Subjective: Pt states she is breathing better than yesterday but remains sob with exertion.  Denies f/c, n/v/d, abd pain, cp  Objective: Vitals:   12/25/23 0617 12/25/23 0720 12/25/23 0725 12/25/23 0727  BP: (!) 118/48     Pulse: 94     Resp: 17     Temp: 98 F (36.7 C)     TempSrc: Oral     SpO2: 93% 94% 94% 94%  Weight:      Height:        Intake/Output Summary (Last 24 hours) at 12/25/2023 0835 Last data filed at 12/25/2023 0002 Gross per 24 hour  Intake 240 ml  Output --  Net 240 ml   Weight change:  Exam:  General:  Pt is alert, follows commands appropriately, not in acute distress HEENT: No icterus, No thrush, No neck mass, [place]/AT Cardiovascular: RRR, S1/S2, no rubs, no gallops Respiratory: bilateral rales.  Bibasilar wheeze Abdomen: Soft/+BS, non tender, non distended, no guarding Extremities: No edema, No lymphangitis, No petechiae, No rashes, no synovitis   Data Reviewed: I have personally reviewed following labs and imaging studies Basic Metabolic Panel: Recent Labs  Lab 12/24/23 1743 12/25/23 8657  NA 134* 136  K 3.5 3.0*  CL 93* 101  CO2 27 25  GLUCOSE 146* 103*  BUN 17 14  CREATININE 1.05* 0.80  CALCIUM 8.6* 7.9*  MG  --  1.5*  PHOS  --  2.0*   Liver Function Tests: Recent Labs  Lab 12/24/23 1743 12/25/23 0213  AST 22 18  ALT 16 12  ALKPHOS 88 75  BILITOT 1.2 1.1  PROT 7.0 5.8*  ALBUMIN 2.9* 2.4*   No results for  input(s): "LIPASE", "AMYLASE" in the last 168 hours. No results for input(s): "AMMONIA" in the last 168 hours. Coagulation Profile: No results for input(s): "INR", "PROTIME" in the last 168 hours. CBC: Recent Labs  Lab 12/24/23 1743 12/25/23 0213  WBC 12.0* 11.4*  NEUTROABS 9.8*  --   HGB 12.9 11.3*  HCT 40.7 34.9*  MCV 95.3 96.1  PLT 238 230   Cardiac Enzymes: No results for input(s): "CKTOTAL", "CKMB", "CKMBINDEX", "TROPONINI" in the last 168 hours. BNP: Invalid input(s): "POCBNP" CBG: No results for input(s): "GLUCAP" in the last 168 hours. HbA1C: No results for input(s): "HGBA1C" in the last 72 hours. Urine analysis:    Component Value Date/Time   COLORURINE YELLOW 11/16/2019 1503   APPEARANCEUR HAZY (A) 11/16/2019 1503   LABSPEC 1.020 11/16/2019 1503   PHURINE 5.0 11/16/2019 1503   GLUCOSEU NEGATIVE 11/16/2019 1503   HGBUR NEGATIVE 11/16/2019 1503   BILIRUBINUR NEGATIVE 11/16/2019 1503   BILIRUBINUR neg 07/03/2015 0846   KETONESUR NEGATIVE 11/16/2019 1503   PROTEINUR NEGATIVE 11/16/2019 1503   UROBILINOGEN negative 07/03/2015 0846   NITRITE NEGATIVE 11/16/2019 1503   LEUKOCYTESUR TRACE (A) 11/16/2019 1503   Sepsis Labs: @LABRCNTIP (procalcitonin:4,lacticidven:4) ) Recent Results (from the past 240 hours)  Resp panel by RT-PCR (RSV, Flu A&B, Covid) Anterior Nasal Swab     Status: None   Collection Time: 12/24/23  5:02 PM   Specimen: Anterior Nasal Swab  Result Value Ref Range Status   SARS Coronavirus 2 by RT PCR NEGATIVE NEGATIVE Final    Comment: (NOTE) SARS-CoV-2 target nucleic acids are NOT DETECTED.  The SARS-CoV-2 RNA is generally detectable in upper respiratory specimens during the acute phase of infection. The lowest concentration of SARS-CoV-2 viral copies this assay can detect is 138 copies/mL. A negative result does not preclude SARS-Cov-2 infection and should not be used as the sole basis for treatment or other patient management decisions. A  negative result may occur with  improper specimen collection/handling, submission of specimen other than nasopharyngeal swab, presence of viral mutation(s) within the areas targeted by this assay, and inadequate number of viral copies(<138 copies/mL). A negative result must be combined with clinical observations, patient history, and epidemiological information. The expected result is Negative.  Fact Sheet for Patients:  BloggerCourse.com  Fact Sheet for Healthcare Providers:  SeriousBroker.it  This test is no t yet approved or cleared by the [place]  FDA and  has been authorized for detection and/or diagnosis of SARS-CoV-2 by FDA under an Emergency Use Authorization (EUA). This EUA will remain  in effect (meaning this test can be used) for the duration of the COVID-19 declaration under Section 564(b)(1) of the Act, 21 U.S.C.section 360bbb-3(b)(1), unless the authorization is terminated  or revoked sooner.       Influenza A by PCR NEGATIVE NEGATIVE Final   Influenza B by PCR NEGATIVE NEGATIVE Final    Comment: (NOTE) The Xpert Xpress SARS-CoV-2/FLU/RSV plus assay is intended as an aid in the diagnosis of influenza from Nasopharyngeal swab  specimens and should not be used as a sole basis for treatment. Nasal washings and aspirates are unacceptable for Xpert Xpress SARS-CoV-2/FLU/RSV testing.  Fact Sheet for Patients: BloggerCourse.com  Fact Sheet for Healthcare Providers: SeriousBroker.it  This test is not yet approved or cleared by the [place]  FDA and has been authorized for detection and/or diagnosis of SARS-CoV-2 by FDA under an Emergency Use Authorization (EUA). This EUA will remain in effect (meaning this test can be used) for the duration of the COVID-19 declaration under Section 564(b)(1) of the Act, 21 U.S.C. section 360bbb-3(b)(1), unless the authorization  is terminated or revoked.     Resp Syncytial Virus by PCR NEGATIVE NEGATIVE Final    Comment: (NOTE) Fact Sheet for Patients: BloggerCourse.com  Fact Sheet for Healthcare Providers: SeriousBroker.it  This test is not yet approved or cleared by the [place]  FDA and has been authorized for detection and/or diagnosis of SARS-CoV-2 by FDA under an Emergency Use Authorization (EUA). This EUA will remain in effect (meaning this test can be used) for the duration of the COVID-19 declaration under Section 564(b)(1) of the Act, 21 U.S.C. section 360bbb-3(b)(1), unless the authorization is terminated or revoked.  Performed at [facility], [address], [place], [place] [address]   Culture, blood (routine x 2)     Status: None (Preliminary result)   Collection Time: 12/24/23  5:43 PM   Specimen: BLOOD LEFT ARM  Result Value Ref Range Status   Specimen Description BLOOD LEFT ARM  Final   Special Requests   Final    BOTTLES DRAWN AEROBIC AND ANAEROBIC Blood Culture adequate volume   Culture   Final    NO GROWTH < 24 HOURS Performed at [facility], [address], [place], [place] [address]    Report Status PENDING  Incomplete  Culture, blood (routine x 2)     Status: None (Preliminary result)   Collection Time: 12/24/23  5:46 PM   Specimen: BLOOD RIGHT ARM  Result Value Ref Range Status   Specimen Description BLOOD RIGHT ARM  Final   Special Requests   Final    BOTTLES DRAWN AEROBIC AND ANAEROBIC Blood Culture adequate volume   Culture   Final    NO GROWTH < 24 HOURS Performed at [facility], [address], [place], [place] [address]    Report Status PENDING  Incomplete     Scheduled Meds:  allopurinol   100 mg Oral Daily   arformoterol   15 mcg Nebulization BID   budesonide  (PULMICORT ) nebulizer solution  0.5 mg Nebulization BID   dextromethorphan -guaiFENesin   1 tablet Oral BID   enoxaparin  (LOVENOX ) injection  40 mg  Subcutaneous Q24H   feeding supplement  237 mL Oral BID BM   ipratropium  0.5 mg Nebulization Q6H   levalbuterol   0.63 mg Nebulization Q6H   methylPREDNISolone  (SOLU-MEDROL ) injection  40 mg Intravenous Q12H   metoprolol  tartrate  37.5 mg Oral BID   pantoprazole   40 mg Oral Daily   Continuous Infusions:  azithromycin      cefTRIAXone  (ROCEPHIN )  IV     magnesium  sulfate bolus IVPB 2 g (12/25/23 0828)   potassium PHOSPHATE IVPB (in mmol)      Procedures/Studies: CT Angio Chest PE W and/or Wo Contrast Result Date: 12/24/2023 CLINICAL DATA:  Shortness of breath cough EXAM: CT ANGIOGRAPHY CHEST WITH CONTRAST TECHNIQUE: Multidetector CT imaging of the chest was performed using the standard protocol during bolus administration of intravenous contrast. Multiplanar CT image reconstructions and MIPs were obtained to  evaluate the vascular anatomy. RADIATION DOSE REDUCTION: This exam was performed according to the departmental dose-optimization program which includes automated exposure control, adjustment of the mA and/or kV according to patient size and/or use of iterative reconstruction technique. CONTRAST:  75mL OMNIPAQUE  IOHEXOL  350 MG/ML SOLN COMPARISON:  Chest x-ray 12/24/2023 FINDINGS: Cardiovascular: Satisfactory opacification of the pulmonary arteries to the segmental level. No evidence of pulmonary embolism. Mild aortic atherosclerosis. No aneurysm or dissection. Normal cardiac size. No pericardial effusion Mediastinum/Nodes: Patent trachea. No thyroid  mass. No suspicious lymph nodes. Esophagus within normal limits. Lungs/Pleura: Bilateral lower lobe consolidations. No pleural effusion or pneumothorax Upper Abdomen: No acute finding Musculoskeletal: No acute osseous abnormality. Review of the MIP images confirms the above findings. IMPRESSION: 1. Negative for acute pulmonary embolus. 2. Bilateral lower lobe consolidations suspect for pneumonia. Imaging follow-up to resolution is recommended. 3. Aortic  atherosclerosis. Aortic Atherosclerosis (ICD10-I70.0). Electronically Signed   By: Esmeralda Hedge M.D.   On: 12/24/2023 21:05   DG Chest Port 1 View Result Date: 12/24/2023 CLINICAL DATA:  Shortness of breath EXAM: PORTABLE CHEST 1 VIEW COMPARISON:  09/20/2023, 11/29/2023 FINDINGS: Right shoulder replacement. Borderline to mild cardiomegaly with slight central congestion. Mild atelectasis or infiltrate at the left base. Probable small pleural effusions. No pneumothorax IMPRESSION: Borderline to mild cardiomegaly with slight central congestion and probable small pleural effusions. Atelectasis or mild infiltrate at the left base. Electronically Signed   By: Esmeralda Hedge M.D.   On: 12/24/2023 19:24    Demaris Fillers, DO  [facility]  If 7PM-7AM, please contact night-coverage [email or web address] Password [email or web address] 12/25/2023, 8:35 AM   LOS: 1 day

## 2023-12-25 NOTE — Progress Notes (Signed)
 Mobility Specialist Progress Note:    12/25/23 0920  Mobility  Activity Ambulated with assistance in hallway  Level of Assistance Standby assist, set-up cues, supervision of patient - no hands on  Assistive Device None  Distance Ambulated (ft) 60 ft  Range of Motion/Exercises Active;All extremities  Activity Response Tolerated well  Mobility Referral Yes  Mobility visit 1 Mobility  Mobility Specialist Start Time (ACUTE ONLY) 0920  Mobility Specialist Stop Time (ACUTE ONLY) 0940  Mobility Specialist Time Calculation (min) (ACUTE ONLY) 20 min   Pt received in bed, agreeable to mobility. Required supervision to stand and ambulate with no AD. Tolerated well, SpO2 92% on 2L at rest. SpO2 89-91% on 2L during ambulation Returned pt supine, all needs met.  Lamonda Noxon Mobility Specialist Please contact via Special educational needs teacher or  Rehab office at [phone]

## 2023-12-25 NOTE — Progress Notes (Signed)
   12/25/23 1807  TOC Assessment  TOC screening is complete Yes  Expected Discharge Plan Home/Self Care  Final next level of care Home/Self Care  Barriers to Discharge Continued Medical Work up  Patient states their goals for this hospitalization and ongoing recovery are: Discharge home  CMS Medicare.gov Compare Post Acute Care list provided to: Patient  Living arrangements for the past 2 months Single Family Home  Lives with: Self  Share Information with NAME Manuela Sella  Permission granted to share info w Relationship Daughter  Permission granted to share info w Contact Information [phone]  Patient language and need for interpreter reviewed: No  Criminal Activity/Legal Involvement Pertinent to Current Situation/Hospitalization No - Comment as needed  Need for Family Participation in Patient Care Y  Care giver support system in place? Y  Appearance: Appears stated age  Attitude/Demeanor/Rapport Engaged  Affect (typically observed) Appropriate  Orientation:  Oriented to Self;Oriented to Situation;Oriented to Place;Oriented to  Time  Alcohol  / Substance Use Tobacco Use   Admitted  for Community Acquired pneumonia. TOC will follow through progressions rounds.

## 2023-12-26 DIAGNOSIS — R6520 Severe sepsis without septic shock: Secondary | ICD-10-CM

## 2023-12-26 DIAGNOSIS — J181 Lobar pneumonia, unspecified organism: Secondary | ICD-10-CM

## 2023-12-26 DIAGNOSIS — A419 Sepsis, unspecified organism: Secondary | ICD-10-CM | POA: Diagnosis not present

## 2023-12-26 DIAGNOSIS — J441 Chronic obstructive pulmonary disease with (acute) exacerbation: Secondary | ICD-10-CM | POA: Diagnosis not present

## 2023-12-26 LAB — BASIC METABOLIC PANEL WITH GFR
Anion gap: 9 (ref 5–15)
BUN: 20 mg/dL (ref 8–23)
CO2: 26 mmol/L (ref 22–32)
Calcium: 8.3 mg/dL — ABNORMAL LOW (ref 8.9–10.3)
Chloride: 103 mmol/L (ref 98–111)
Creatinine, Ser: 0.7 mg/dL (ref 0.44–1.00)
GFR, Estimated: >60 mL/min (ref >60)
Glucose, Bld: 172 mg/dL — ABNORMAL HIGH (ref 70–99)
Potassium: 3.5 mmol/L (ref 3.5–5.1)
Sodium: 138 mmol/L (ref 135–145)

## 2023-12-26 LAB — MAGNESIUM: Magnesium: 2.2 mg/dL (ref 1.7–2.4)

## 2023-12-26 LAB — PHOSPHORUS: Phosphorus: 2.9 mg/dL (ref 2.5–4.6)

## 2023-12-26 MED ORDER — REVEFENACIN 175 MCG/3ML IN SOLN
175.0000 ug | Freq: Every day | RESPIRATORY_TRACT | Status: DC
  Administered 2023-12-27: 175 ug via RESPIRATORY_TRACT
  Filled 2023-12-26: qty 3

## 2023-12-26 MED ORDER — K PHOS MONO-SOD PHOS DI & MONO 155-852-130 MG PO TABS
500.0000 mg | ORAL_TABLET | Freq: Two times a day (BID) | ORAL | Status: DC
  Administered 2023-12-26 – 2023-12-27 (×2): 500 mg via ORAL
  Filled 2023-12-26 (×2): qty 2

## 2023-12-26 NOTE — Plan of Care (Signed)

## 2023-12-26 NOTE — Progress Notes (Signed)
 PROGRESS NOTE  Mary Moss WUJ:[id number] DOB: 08-29-1935 DOA: 12/24/2023 PCP: Rosslyn Coons, MD  Brief History:  88 y/o female with history of hypertension, GERD/PUD, CKD stage III, seropositive rheumatoid arthritis, hyperlipidemia, impaired glucose tolerance, and vitamin D  deficiency presenting with 2-day history of generalized weakness, nonproductive cough, nasal congestion,  myalgias, arthralgias, and fevers and chills. She went to  urgent care about 3 weeks before this admission and was placed on an abx and prednisone  and she improved.  However, her symptoms worsened again 2 days prior to admission.  She went to urgent care again and she was found to be hypoxic and sent to the ED for further evaluation.   She denies cp, hemoptysis, n/v/d, abd pain, dysuria, headache.  In the ED she had a low grade temp of 99.2, but was hemodynamically stable.  Her oxygen saturation was 89% on RA and she was placed on 2L with saturation 94-96%.  She was given IV fluids and started on ceftriaxone  and azithromycin .  CTA chest showed bilateral LL consolidation without edema or effusions.     Assessment/Plan: Severe Sepsis -due to pneumonia -presented with tachycardia, leukocytosis -lactic peaked 3.1 -continue ceftriaxone  and azithro -initially on IVF -sepsis physiology resovled   Acute respiratory failure with hypoxia -Secondary to pneumonia -Initially placed on 2L -Wean oxygen for saturation greater 90%   Lobar Pneumonia/COPD Exacerbation -she has 40 pack year hx of tobacco, quit 16 yr ago -continue xopenex  -added yupelri -continue pulmicort  -continue brovana  -continue IV solumedrol -5/30 CTA chest as discussed above -continue ceftriaxone  and azithro   Paroxysmal atrial fibrillation -developed afib her last hospitalization 08/2023 -had 30 day monitor without recurrent afib 10/26/23 -apixaban  stopped -remains in sinus   GERD -hx of PUD -continue pantoprazole    Seropositive  RA -receives golinumab every 2 months--in the past week -restart leflunomide  and hydroxychloroquine    Essential HTN -holding lisinopril  and hydrochlorothiazide  to allow BP margin -restart metoprolol  tartrate -BP controlled   Gout -restart allopurinol    Hypokalemia/hypomagnesemia/hypophosphatemia -replete                     Family Communication:  no  Family at bedside   Consultants:  none   Code Status:  FULL    DVT Prophylaxis: [place] Lovenox      Procedures: As Listed in Progress Note Above   Antibiotics: Ceftriaxone  5/30>> Azithro 5/30>>        Subjective: Pt is breathing better, but complains of chest congestion.  Denies f/,c cp, n/v/d.  Has nonproductive cough  Objective: Vitals:   12/26/23 0738 12/26/23 0755 12/26/23 1302 12/26/23 1458  BP:    123/66  Pulse:    88  Resp:    17  Temp:    97.9 F (36.6 C)  TempSrc:    Oral  SpO2: 94% 94% 94% 93%  Weight:      Height:        Intake/Output Summary (Last 24 hours) at 12/26/2023 1643 Last data filed at 12/26/2023 0300 Gross per 24 hour  Intake 250 ml  Output --  Net 250 ml   Weight change:  Exam:  General:  Pt is alert, follows commands appropriately, not in acute distress HEENT: No icterus, No thrush, No neck mass, [place]/AT Cardiovascular: RRR, S1/S2, no rubs, no gallops Respiratory: bibiasilar rales. No wheeze Abdomen: Soft/+BS, non tender, non distended, no guarding Extremities: No edema, No lymphangitis, No petechiae, No rashes, no synovitis   Data Reviewed:  I have personally reviewed following labs and imaging studies Basic Metabolic Panel: Recent Labs  Lab 12/24/23 1743 12/25/23 0213 12/26/23 0239  NA 134* 136 138  K 3.5 3.0* 3.5  CL 93* 101 103  CO2 27 25 26   GLUCOSE 146* 103* 172*  BUN 17 14 20   CREATININE 1.05* 0.80 0.70  CALCIUM 8.6* 7.9* 8.3*  MG  --  1.5* 2.2  PHOS  --  2.0* 2.9   Liver Function Tests: Recent Labs  Lab 12/24/23 1743 12/25/23 0213  AST 22 18  ALT  16 12  ALKPHOS 88 75  BILITOT 1.2 1.1  PROT 7.0 5.8*  ALBUMIN 2.9* 2.4*   No results for input(s): "LIPASE", "AMYLASE" in the last 168 hours. No results for input(s): "AMMONIA" in the last 168 hours. Coagulation Profile: No results for input(s): "INR", "PROTIME" in the last 168 hours. CBC: Recent Labs  Lab 12/24/23 1743 12/25/23 0213  WBC 12.0* 11.4*  NEUTROABS 9.8*  --   HGB 12.9 11.3*  HCT 40.7 34.9*  MCV 95.3 96.1  PLT 238 230   Cardiac Enzymes: No results for input(s): "CKTOTAL", "CKMB", "CKMBINDEX", "TROPONINI" in the last 168 hours. BNP: Invalid input(s): "POCBNP" CBG: No results for input(s): "GLUCAP" in the last 168 hours. HbA1C: No results for input(s): "HGBA1C" in the last 72 hours. Urine analysis:    Component Value Date/Time   COLORURINE YELLOW 11/16/2019 1503   APPEARANCEUR HAZY (A) 11/16/2019 1503   LABSPEC 1.020 11/16/2019 1503   PHURINE 5.0 11/16/2019 1503   GLUCOSEU NEGATIVE 11/16/2019 1503   HGBUR NEGATIVE 11/16/2019 1503   BILIRUBINUR NEGATIVE 11/16/2019 1503   BILIRUBINUR neg 07/03/2015 0846   KETONESUR NEGATIVE 11/16/2019 1503   PROTEINUR NEGATIVE 11/16/2019 1503   UROBILINOGEN negative 07/03/2015 0846   NITRITE NEGATIVE 11/16/2019 1503   LEUKOCYTESUR TRACE (A) 11/16/2019 1503   Sepsis Labs: @LABRCNTIP (procalcitonin:4,lacticidven:4) ) Recent Results (from the past 240 hours)  Resp panel by RT-PCR (RSV, Flu A&B, Covid) Anterior Nasal Swab     Status: None   Collection Time: 12/24/23  5:02 PM   Specimen: Anterior Nasal Swab  Result Value Ref Range Status   SARS Coronavirus 2 by RT PCR NEGATIVE NEGATIVE Final    Comment: (NOTE) SARS-CoV-2 target nucleic acids are NOT DETECTED.  The SARS-CoV-2 RNA is generally detectable in upper respiratory specimens during the acute phase of infection. The lowest concentration of SARS-CoV-2 viral copies this assay can detect is 138 copies/mL. A negative result does not preclude SARS-Cov-2 infection and  should not be used as the sole basis for treatment or other patient management decisions. A negative result may occur with  improper specimen collection/handling, submission of specimen other than nasopharyngeal swab, presence of viral mutation(s) within the areas targeted by this assay, and inadequate number of viral copies(<138 copies/mL). A negative result must be combined with clinical observations, patient history, and epidemiological information. The expected result is Negative.  Fact Sheet for Patients:  BloggerCourse.com  Fact Sheet for Healthcare Providers:  SeriousBroker.it  This test is no t yet approved or cleared by the [place]  FDA and  has been authorized for detection and/or diagnosis of SARS-CoV-2 by FDA under an Emergency Use Authorization (EUA). This EUA will remain  in effect (meaning this test can be used) for the duration of the COVID-19 declaration under Section 564(b)(1) of the Act, 21 U.S.C.section 360bbb-3(b)(1), unless the authorization is terminated  or revoked sooner.       Influenza A by PCR NEGATIVE NEGATIVE Final  Influenza B by PCR NEGATIVE NEGATIVE Final    Comment: (NOTE) The Xpert Xpress SARS-CoV-2/FLU/RSV plus assay is intended as an aid in the diagnosis of influenza from Nasopharyngeal swab specimens and should not be used as a sole basis for treatment. Nasal washings and aspirates are unacceptable for Xpert Xpress SARS-CoV-2/FLU/RSV testing.  Fact Sheet for Patients: BloggerCourse.com  Fact Sheet for Healthcare Providers: SeriousBroker.it  This test is not yet approved or cleared by the [place]  FDA and has been authorized for detection and/or diagnosis of SARS-CoV-2 by FDA under an Emergency Use Authorization (EUA). This EUA will remain in effect (meaning this test can be used) for the duration of the COVID-19 declaration  under Section 564(b)(1) of the Act, 21 U.S.C. section 360bbb-3(b)(1), unless the authorization is terminated or revoked.     Resp Syncytial Virus by PCR NEGATIVE NEGATIVE Final    Comment: (NOTE) Fact Sheet for Patients: BloggerCourse.com  Fact Sheet for Healthcare Providers: SeriousBroker.it  This test is not yet approved or cleared by the [place]  FDA and has been authorized for detection and/or diagnosis of SARS-CoV-2 by FDA under an Emergency Use Authorization (EUA). This EUA will remain in effect (meaning this test can be used) for the duration of the COVID-19 declaration under Section 564(b)(1) of the Act, 21 U.S.C. section 360bbb-3(b)(1), unless the authorization is terminated or revoked.  Performed at [facility], [address] [place], [place] [address]   Culture, blood (routine x 2)     Status: None (Preliminary result)   Collection Time: 12/24/23  5:43 PM   Specimen: BLOOD LEFT ARM  Result Value Ref Range Status   Specimen Description BLOOD LEFT ARM  Final   Special Requests   Final    BOTTLES DRAWN AEROBIC AND ANAEROBIC Blood Culture adequate volume   Culture   Final    NO GROWTH 2 DAYS Performed at [facility], [address], [place], [place] [address]    Report Status PENDING  Incomplete  Culture, blood (routine x 2)     Status: Abnormal (Preliminary result)   Collection Time: 12/24/23  5:46 PM   Specimen: BLOOD RIGHT ARM  Result Value Ref Range Status   Specimen Description   Final    BLOOD RIGHT ARM Performed at [facility], [address], [place], [place] [address]    Special Requests   Final    BOTTLES DRAWN AEROBIC AND ANAEROBIC Blood Culture adequate volume Performed at [facility], [address] [place], [place] [address]    Culture  Setup Time   Final    GRAM POSITIVE COCCI AEROBIC BOTTLE ONLY Gram Stain Report Called to,Read Back By and Verified With: R TEJEDA AT 1148 ON [id number] BY S  [place] CRITICAL RESULT CALLED TO, READ BACK BY AND VERIFIED WITH: RN [employer] [id number] AT 1638 BY EC    Culture (A)  Final    STAPHYLOCOCCUS HOMINIS THE SIGNIFICANCE OF ISOLATING THIS ORGANISM FROM A SINGLE SET OF BLOOD CULTURES WHEN MULTIPLE SETS ARE DRAWN IS UNCERTAIN. PLEASE NOTIFY THE MICROBIOLOGY DEPARTMENT WITHIN ONE WEEK IF SPECIATION AND SENSITIVITIES ARE REQUIRED. Performed at [facility], [address], [place], [place] [address]    Report Status PENDING  Incomplete  Blood Culture ID Panel (Reflexed)     Status: Abnormal   Collection Time: 12/24/23  5:46 PM  Result Value Ref Range Status   Enterococcus faecalis NOT DETECTED NOT DETECTED Final   Enterococcus Faecium NOT DETECTED NOT DETECTED Final   Listeria  monocytogenes NOT DETECTED NOT DETECTED Final   Staphylococcus species DETECTED (A) NOT DETECTED Final    Comment: CRITICAL RESULT CALLED TO, READ BACK BY AND VERIFIED WITH: RN [employer] [id number] AT 1638 BY EC    Staphylococcus aureus (BCID) NOT DETECTED NOT DETECTED Final   Staphylococcus epidermidis NOT DETECTED NOT DETECTED Final   Staphylococcus lugdunensis NOT DETECTED NOT DETECTED Final   Streptococcus species NOT DETECTED NOT DETECTED Final   Streptococcus agalactiae NOT DETECTED NOT DETECTED Final   Streptococcus pneumoniae NOT DETECTED NOT DETECTED Final   Streptococcus pyogenes NOT DETECTED NOT DETECTED Final   A.calcoaceticus-baumannii NOT DETECTED NOT DETECTED Final   Bacteroides fragilis NOT DETECTED NOT DETECTED Final   Enterobacterales NOT DETECTED NOT DETECTED Final   Enterobacter cloacae complex NOT DETECTED NOT DETECTED Final   Escherichia coli NOT DETECTED NOT DETECTED Final   Klebsiella aerogenes NOT DETECTED NOT DETECTED Final   Klebsiella oxytoca NOT DETECTED NOT DETECTED Final   Klebsiella pneumoniae NOT DETECTED NOT DETECTED Final   Proteus species NOT DETECTED NOT DETECTED Final   Salmonella species NOT DETECTED NOT DETECTED  Final   Serratia marcescens NOT DETECTED NOT DETECTED Final   Haemophilus influenzae NOT DETECTED NOT DETECTED Final   Neisseria meningitidis NOT DETECTED NOT DETECTED Final   Pseudomonas aeruginosa NOT DETECTED NOT DETECTED Final   Stenotrophomonas maltophilia NOT DETECTED NOT DETECTED Final   Candida albicans NOT DETECTED NOT DETECTED Final   Candida auris NOT DETECTED NOT DETECTED Final   Candida glabrata NOT DETECTED NOT DETECTED Final   Candida krusei NOT DETECTED NOT DETECTED Final   Candida parapsilosis NOT DETECTED NOT DETECTED Final   Candida tropicalis NOT DETECTED NOT DETECTED Final   Cryptococcus neoformans/gattii NOT DETECTED NOT DETECTED Final    Comment: Performed at [facility], [address], [place], [place] [address]  Respiratory (~20 pathogens) panel by PCR     Status: Abnormal   Collection Time: 12/25/23  8:49 AM   Specimen: Nasopharyngeal Swab; Respiratory  Result Value Ref Range Status   Adenovirus NOT DETECTED NOT DETECTED Final   Coronavirus 229E NOT DETECTED NOT DETECTED Final    Comment: (NOTE) The Coronavirus on the Respiratory Panel, DOES NOT test for the novel  Coronavirus (2019 nCoV)    Coronavirus HKU1 NOT DETECTED NOT DETECTED Final   Coronavirus NL63 NOT DETECTED NOT DETECTED Final   Coronavirus OC43 NOT DETECTED NOT DETECTED Final   Metapneumovirus NOT DETECTED NOT DETECTED Final   Rhinovirus / Enterovirus DETECTED (A) NOT DETECTED Final   Influenza A NOT DETECTED NOT DETECTED Final   Influenza B NOT DETECTED NOT DETECTED Final   Parainfluenza Virus 1 NOT DETECTED NOT DETECTED Final   Parainfluenza Virus 2 NOT DETECTED NOT DETECTED Final   Parainfluenza Virus 3 NOT DETECTED NOT DETECTED Final   Parainfluenza Virus 4 NOT DETECTED NOT DETECTED Final   Respiratory Syncytial Virus NOT DETECTED NOT DETECTED Final   Bordetella pertussis NOT DETECTED NOT DETECTED Final   Bordetella Parapertussis NOT DETECTED NOT DETECTED Final   Chlamydophila  pneumoniae NOT DETECTED NOT DETECTED Final   Mycoplasma pneumoniae NOT DETECTED NOT DETECTED Final    Comment: Performed at [facility], [address] [place], [place] [address]  MRSA Next Gen by PCR, Nasal     Status: None   Collection Time: 12/25/23  8:49 AM   Specimen: Nasal Mucosa; Nasal Swab  Result Value Ref Range Status   MRSA by PCR Next Gen NOT DETECTED NOT  DETECTED Final    Comment: (NOTE) The GeneXpert MRSA Assay (FDA approved for NASAL specimens only), is one component of a comprehensive MRSA colonization surveillance program. It is not intended to diagnose MRSA infection nor to guide or monitor treatment for MRSA infections. Test performance is not FDA approved in patients less than 61 years old. Performed at [facility], [address], [place], [place] [address]      Scheduled Meds:  allopurinol   100 mg Oral Daily   arformoterol   15 mcg Nebulization BID   budesonide  (PULMICORT ) nebulizer solution  0.5 mg Nebulization BID   dextromethorphan -guaiFENesin   1 tablet Oral BID   enoxaparin  (LOVENOX ) injection  40 mg Subcutaneous Q24H   feeding supplement  237 mL Oral BID BM   ipratropium  0.5 mg Nebulization Q6H   levalbuterol   0.63 mg Nebulization Q6H   methylPREDNISolone  (SOLU-MEDROL ) injection  40 mg Intravenous Q12H   metoprolol  tartrate  37.5 mg Oral BID   pantoprazole   40 mg Oral Daily   Continuous Infusions:  azithromycin  Stopped (12/25/23 2230)   cefTRIAXone  (ROCEPHIN )  IV 2 g (12/25/23 1752)    Procedures/Studies: CT Angio Chest PE W and/or Wo Contrast Result Date: 12/24/2023 CLINICAL DATA:  Shortness of breath cough EXAM: CT ANGIOGRAPHY CHEST WITH CONTRAST TECHNIQUE: Multidetector CT imaging of the chest was performed using the standard protocol during bolus administration of intravenous contrast. Multiplanar CT image reconstructions and MIPs were obtained to evaluate the vascular anatomy. RADIATION DOSE REDUCTION: This exam was performed according to  the departmental dose-optimization program which includes automated exposure control, adjustment of the mA and/or kV according to patient size and/or use of iterative reconstruction technique. CONTRAST:  75mL OMNIPAQUE  IOHEXOL  350 MG/ML SOLN COMPARISON:  Chest x-ray 12/24/2023 FINDINGS: Cardiovascular: Satisfactory opacification of the pulmonary arteries to the segmental level. No evidence of pulmonary embolism. Mild aortic atherosclerosis. No aneurysm or dissection. Normal cardiac size. No pericardial effusion Mediastinum/Nodes: Patent trachea. No thyroid  mass. No suspicious lymph nodes. Esophagus within normal limits. Lungs/Pleura: Bilateral lower lobe consolidations. No pleural effusion or pneumothorax Upper Abdomen: No acute finding Musculoskeletal: No acute osseous abnormality. Review of the MIP images confirms the above findings. IMPRESSION: 1. Negative for acute pulmonary embolus. 2. Bilateral lower lobe consolidations suspect for pneumonia. Imaging follow-up to resolution is recommended. 3. Aortic atherosclerosis. Aortic Atherosclerosis (ICD10-I70.0). Electronically Signed   By: Esmeralda Hedge M.D.   On: 12/24/2023 21:05   DG Chest Port 1 View Result Date: 12/24/2023 CLINICAL DATA:  Shortness of breath EXAM: PORTABLE CHEST 1 VIEW COMPARISON:  09/20/2023, 11/29/2023 FINDINGS: Right shoulder replacement. Borderline to mild cardiomegaly with slight central congestion. Mild atelectasis or infiltrate at the left base. Probable small pleural effusions. No pneumothorax IMPRESSION: Borderline to mild cardiomegaly with slight central congestion and probable small pleural effusions. Atelectasis or mild infiltrate at the left base. Electronically Signed   By: Esmeralda Hedge M.D.   On: 12/24/2023 19:24    Demaris Fillers, DO  [facility]  If 7PM-7AM, please contact night-coverage [email or web address] Password [facility] 12/26/2023, 4:43 PM   LOS: 2 days

## 2023-12-27 DIAGNOSIS — J9601 Acute respiratory failure with hypoxia: Secondary | ICD-10-CM

## 2023-12-27 DIAGNOSIS — J441 Chronic obstructive pulmonary disease with (acute) exacerbation: Secondary | ICD-10-CM | POA: Diagnosis not present

## 2023-12-27 DIAGNOSIS — J181 Lobar pneumonia, unspecified organism: Secondary | ICD-10-CM | POA: Diagnosis not present

## 2023-12-27 DIAGNOSIS — A419 Sepsis, unspecified organism: Secondary | ICD-10-CM | POA: Diagnosis not present

## 2023-12-27 LAB — LEGIONELLA PNEUMOPHILA SEROGP 1 UR AG: L. pneumophila Serogp 1 Ur Ag: NEGATIVE

## 2023-12-27 LAB — BASIC METABOLIC PANEL WITH GFR
Anion gap: 7 (ref 5–15)
BUN: 29 mg/dL — ABNORMAL HIGH (ref 8–23)
CO2: 28 mmol/L (ref 22–32)
Calcium: 8.4 mg/dL — ABNORMAL LOW (ref 8.9–10.3)
Chloride: 104 mmol/L (ref 98–111)
Creatinine, Ser: 0.84 mg/dL (ref 0.44–1.00)
GFR, Estimated: >60 mL/min (ref >60)
Glucose, Bld: 162 mg/dL — ABNORMAL HIGH (ref 70–99)
Potassium: 3.6 mmol/L (ref 3.5–5.1)
Sodium: 139 mmol/L (ref 135–145)

## 2023-12-27 LAB — CULTURE, BLOOD (ROUTINE X 2): Special Requests: ADEQUATE

## 2023-12-27 LAB — MAGNESIUM: Magnesium: 2.1 mg/dL (ref 1.7–2.4)

## 2023-12-27 MED ORDER — CEFDINIR 300 MG PO CAPS: 300.0000 mg | ORAL_CAPSULE | Freq: Two times a day (BID) | ORAL | 0 refills | Status: DC

## 2023-12-27 MED ORDER — POTASSIUM CHLORIDE CRYS ER 20 MEQ PO TBCR
20.0000 meq | EXTENDED_RELEASE_TABLET | Freq: Once | ORAL | Status: AC
  Administered 2023-12-27: 20 meq via ORAL
  Filled 2023-12-27: qty 1

## 2023-12-27 MED ORDER — AZITHROMYCIN 500 MG PO TABS: 500.0000 mg | ORAL_TABLET | Freq: Every day | ORAL | 0 refills | Status: DC

## 2023-12-27 MED ORDER — PREDNISONE 10 MG PO TABS: 60.0000 mg | ORAL_TABLET | Freq: Every day | ORAL | 0 refills | Status: DC

## 2023-12-27 MED ORDER — FUROSEMIDE 20 MG PO TABS
20.0000 mg | ORAL_TABLET | Freq: Once | ORAL | Status: AC
  Administered 2023-12-27: 20 mg via ORAL
  Filled 2023-12-27: qty 1

## 2023-12-27 MED ORDER — PREDNISONE 20 MG PO TABS
60.0000 mg | ORAL_TABLET | Freq: Every day | ORAL | Status: DC
  Administered 2023-12-27: 60 mg via ORAL
  Filled 2023-12-27: qty 3

## 2023-12-27 MED ORDER — AZITHROMYCIN 250 MG PO TABS: 500.0000 mg | ORAL_TABLET | Freq: Every day | ORAL | Status: DC

## 2023-12-27 NOTE — Discharge Summary (Signed)
 Physician Discharge Summary   Patient: Mary Moss MRN: [id number] DOB: 1935-08-29  Admit date:     12/24/2023  Discharge date: 12/27/23  Discharge Physician: Myrtie Atkinson Jolynn Bajorek   PCP: Rosslyn Coons, MD   Recommendations at discharge:   Please follow up with primary care provider within 1-2 weeks  Please repeat BMP and CBC in one week    Hospital Course: 88 y/o female with history of hypertension, GERD/PUD, CKD stage III, seropositive rheumatoid arthritis, hyperlipidemia, impaired glucose tolerance, and vitamin D  deficiency presenting with 2-day history of generalized weakness, nonproductive cough, nasal congestion,  myalgias, arthralgias, and fevers and chills. She went to  urgent care about 3 weeks before this admission and was placed on an abx and prednisone  and she improved.  However, her symptoms worsened again 2 days prior to admission.  She went to urgent care again and she was found to be hypoxic and sent to the ED for further evaluation.   She denies cp, hemoptysis, n/v/d, abd pain, dysuria, headache.  In the ED she had a low grade temp of 99.2, but was hemodynamically stable.  Her oxygen saturation was 89% on RA and she was placed on 2L with saturation 94-96%.  She was given IV fluids and started on ceftriaxone  and azithromycin .  CTA chest showed bilateral LL consolidation without edema or effusions.    The patient was started on bronchodilators IV Solu-Medrol .  She was continued on ceftriaxone  and azithromycin .  She improved clinically.  On day of discharge, ambulatory pulse ox on room air showed desaturation down to 81%.  She was discharged home on 2 L nasal cannula.  Assessment and Plan: Severe Sepsis -due to pneumonia -presented with tachycardia, leukocytosis -lactic peaked 3.1 -continue ceftriaxone  and azithro -initially on IVF -sepsis physiology resovled   Acute respiratory failure with hypoxia -Secondary to pneumonia -Initially placed on 2L -Wean oxygen for  saturation greater 90% -discharge home with 2L [place]   Lobar Pneumonia/COPD Exacerbation -she has 40 pack year hx of tobacco, quit 16 yr ago -continue xopenex  -added yupelri -continuee pulmicort  -continued brovana  -continue IV solumedrol -5/30 CTA chest as discussed above -continue ceftriaxone  and azithro -d/c home with cefdinir and azithro x 3 more days -d/c home with prednisone  taper   Paroxysmal atrial fibrillation -developed afib her last hospitalization 08/2023 -had 30 day monitor without recurrent afib 10/26/23 -apixaban  stopped -remains in sinus   GERD -hx of PUD -continue pantoprazole    Seropositive RA -receives golinumab every 2 months--in the past week -restart leflunomide  and hydroxychloroquine    Essential HTN -holding hydrochlorothiazide  to allow BP margin -restart metoprolol  tartrate -BP controlled   Gout -restart allopurinol    Hypokalemia/hypomagnesemia/hypophosphatemia -repleted      Consultants: none Procedures performed: none  Disposition: Home Diet recommendation:  Cardiac diet DISCHARGE MEDICATION: Allergies as of 12/27/2023       Reactions   Statins Other (See Comments)   Simvastatin, crestor, livalo Not able to walk because so much muscle/joint pain   Bacitracin-polymyxin B Rash   Neosporin [neomycin-bacitracin Zn-polymyx] Rash        Medication List     STOP taking these medications    Eliquis  5 MG Tabs tablet Generic drug: apixaban    hydrochlorothiazide  25 MG tablet Commonly known as: HYDRODIURIL        TAKE these medications    acetaminophen  325 MG tablet Commonly known as: TYLENOL  Take 2 tablets (650 mg total) by mouth every 6 (six) hours as needed for mild pain (or Fever >/= 101).   allopurinol   100 MG tablet Commonly known as: ZYLOPRIM  Take 100 mg by mouth daily.   azithromycin  500 MG tablet Commonly known as: ZITHROMAX  Take 1 tablet (500 mg total) by mouth daily.   cefdinir 300 MG capsule Commonly known as:  OMNICEF Take 1 capsule (300 mg total) by mouth 2 (two) times daily.   D3-50 1.25 MG (50000 UT) capsule Generic drug: Cholecalciferol Take 50,000 Units by mouth every Sunday.   hydroxychloroquine 200 MG tablet Commonly known as: PLAQUENIL Take 400 mg by mouth 2 (two) times daily.   Metoprolol Tartrate 37.5 MG Tabs Take 1 tablet (37.5 mg total) by mouth 2 (two) times daily.   predniSONE 10 MG tablet Commonly known as: DELTASONE Take 6 tablets (60 mg total) by mouth daily with breakfast. And decrease by one tablet daily   ProAir RespiClick 108 (90 Base) MCG/ACT Aepb Generic drug: Albuterol Sulfate Inhale 1-2 puffs into the lungs every 6 (six) hours as needed (shortness of breath).   Tylenol PM Extra Strength 500-25 MG Tabs tablet Generic drug: diphenhydramine-acetaminophen Take 1 tablet by mouth at bedtime.               Durable Medical Equipment  (From admission, onward)           Start     Ordered   12/27/23 0705  For home use only DME oxygen  Once       Comments: POC for COPD  Question Answer Comment  Length of Need 6 Months   Mode or (Route) Nasal cannula   Liters per Minute 2   Frequency Continuous (stationary and portable oxygen unit needed)   Oxygen conserving device Yes   Oxygen delivery system Gas      06 /02/25 0704            Discharge Exam: Filed Weights   12/24/23 2308  Weight: 92.8 kg   HEENT:  [place]/AT, No thrush, no icterus CV:  RRR, no rub, no S3, no S4 Lung:  scattered bilateral rales.  No wheeze Abd:  soft/+BS, NT Ext:  No edema, no lymphangitis, no synovitis, no rash   Condition at discharge: stable  The results of significant diagnostics from this hospitalization (including imaging, microbiology, ancillary and laboratory) are listed below for reference.   Imaging Studies: CT Angio Chest PE W and/or Wo Contrast Result Date: 12/24/2023 CLINICAL DATA:  Shortness of breath cough EXAM: CT ANGIOGRAPHY CHEST WITH CONTRAST TECHNIQUE:  Multidetector CT imaging of the chest was performed using the standard protocol during bolus administration of intravenous contrast. Multiplanar CT image reconstructions and MIPs were obtained to evaluate the vascular anatomy. RADIATION DOSE REDUCTION: This exam was performed according to the departmental dose-optimization program which includes automated exposure control, adjustment of the mA and/or kV according to patient size and/or use of iterative reconstruction technique. CONTRAST:  75mL OMNIPAQUE  IOHEXOL  350 MG/ML SOLN COMPARISON:  Chest x-ray 12/24/2023 FINDINGS: Cardiovascular: Satisfactory opacification of the pulmonary arteries to the segmental level. No evidence of pulmonary embolism. Mild aortic atherosclerosis. No aneurysm or dissection. Normal cardiac size. No pericardial effusion Mediastinum/Nodes: Patent trachea. No thyroid  mass. No suspicious lymph nodes. Esophagus within normal limits. Lungs/Pleura: Bilateral lower lobe consolidations. No pleural effusion or pneumothorax Upper Abdomen: No acute finding Musculoskeletal: No acute osseous abnormality. Review of the MIP images confirms the above findings. IMPRESSION: 1. Negative for acute pulmonary embolus. 2. Bilateral lower lobe consolidations suspect for pneumonia. Imaging follow-up to resolution is recommended. 3. Aortic atherosclerosis. Aortic Atherosclerosis (ICD10-I70.0). Electronically Signed   By:  Esmeralda Hedge M.D.   On: 12/24/2023 21:05   DG Chest Port 1 View Result Date: 12/24/2023 CLINICAL DATA:  Shortness of breath EXAM: PORTABLE CHEST 1 VIEW COMPARISON:  09/20/2023, 11/29/2023 FINDINGS: Right shoulder replacement. Borderline to mild cardiomegaly with slight central congestion. Mild atelectasis or infiltrate at the left base. Probable small pleural effusions. No pneumothorax IMPRESSION: Borderline to mild cardiomegaly with slight central congestion and probable small pleural effusions. Atelectasis or mild infiltrate at the left base.  Electronically Signed   By: Esmeralda Hedge M.D.   On: 12/24/2023 19:24    Microbiology: Results for orders placed or performed during the hospital encounter of 12/24/23  Resp panel by RT-PCR (RSV, Flu A&B, Covid) Anterior Nasal Swab     Status: None   Collection Time: 12/24/23  5:02 PM   Specimen: Anterior Nasal Swab  Result Value Ref Range Status   SARS Coronavirus 2 by RT PCR NEGATIVE NEGATIVE Final    Comment: (NOTE) SARS-CoV-2 target nucleic acids are NOT DETECTED.  The SARS-CoV-2 RNA is generally detectable in upper respiratory specimens during the acute phase of infection. The lowest concentration of SARS-CoV-2 viral copies this assay can detect is 138 copies/mL. A negative result does not preclude SARS-Cov-2 infection and should not be used as the sole basis for treatment or other patient management decisions. A negative result may occur with  improper specimen collection/handling, submission of specimen other than nasopharyngeal swab, presence of viral mutation(s) within the areas targeted by this assay, and inadequate number of viral copies(<138 copies/mL). A negative result must be combined with clinical observations, patient history, and epidemiological information. The expected result is Negative.  Fact Sheet for Patients:  BloggerCourse.com  Fact Sheet for Healthcare Providers:  SeriousBroker.it  This test is no t yet approved or cleared by the [place]  FDA and  has been authorized for detection and/or diagnosis of SARS-CoV-2 by FDA under an Emergency Use Authorization (EUA). This EUA will remain  in effect (meaning this test can be used) for the duration of the COVID-19 declaration under Section 564(b)(1) of the Act, 21 U.S.C.section 360bbb-3(b)(1), unless the authorization is terminated  or revoked sooner.       Influenza A by PCR NEGATIVE NEGATIVE Final   Influenza B by PCR NEGATIVE NEGATIVE Final     Comment: (NOTE) The Xpert Xpress SARS-CoV-2/FLU/RSV plus assay is intended as an aid in the diagnosis of influenza from Nasopharyngeal swab specimens and should not be used as a sole basis for treatment. Nasal washings and aspirates are unacceptable for Xpert Xpress SARS-CoV-2/FLU/RSV testing.  Fact Sheet for Patients: BloggerCourse.com  Fact Sheet for Healthcare Providers: SeriousBroker.it  This test is not yet approved or cleared by the [place]  FDA and has been authorized for detection and/or diagnosis of SARS-CoV-2 by FDA under an Emergency Use Authorization (EUA). This EUA will remain in effect (meaning this test can be used) for the duration of the COVID-19 declaration under Section 564(b)(1) of the Act, 21 U.S.C. section 360bbb-3(b)(1), unless the authorization is terminated or revoked.     Resp Syncytial Virus by PCR NEGATIVE NEGATIVE Final    Comment: (NOTE) Fact Sheet for Patients: BloggerCourse.com  Fact Sheet for Healthcare Providers: SeriousBroker.it  This test is not yet approved or cleared by the [place]  FDA and has been authorized for detection and/or diagnosis of SARS-CoV-2 by FDA under an Emergency Use Authorization (EUA). This EUA will remain in effect (meaning this test can be used) for the duration of the COVID-19  declaration under Section 564(b)(1) of the Act, 21 U.S.C. section 360bbb-3(b)(1), unless the authorization is terminated or revoked.  Performed at [facility], [address] [place], [place] [address]   Culture, blood (routine x 2)     Status: None (Preliminary result)   Collection Time: 12/24/23  5:43 PM   Specimen: BLOOD LEFT ARM  Result Value Ref Range Status   Specimen Description BLOOD LEFT ARM  Final   Special Requests   Final    BOTTLES DRAWN AEROBIC AND ANAEROBIC Blood Culture adequate volume   Culture   Final    NO  GROWTH 3 DAYS Performed at [facility], [address] [place], [place] [address]    Report Status PENDING  Incomplete  Culture, blood (routine x 2)     Status: Abnormal (Preliminary result)   Collection Time: 12/24/23  5:46 PM   Specimen: BLOOD RIGHT ARM  Result Value Ref Range Status   Specimen Description   Final    BLOOD RIGHT ARM Performed at [facility], [address] [place], [place] [address]    Special Requests   Final    BOTTLES DRAWN AEROBIC AND ANAEROBIC Blood Culture adequate volume Performed at [facility], [address] [place], [place] [address]    Culture  Setup Time   Final    GRAM POSITIVE COCCI AEROBIC BOTTLE ONLY Gram Stain Report Called to,Read Back By and Verified With: R TEJEDA AT 1148 ON [id number] BY S [place] CRITICAL RESULT CALLED TO, READ BACK BY AND VERIFIED WITH: RN [employer] [id number] AT 1638 BY EC    Culture (A)  Final    STAPHYLOCOCCUS HOMINIS THE SIGNIFICANCE OF ISOLATING THIS ORGANISM FROM A SINGLE SET OF BLOOD CULTURES WHEN MULTIPLE SETS ARE DRAWN IS UNCERTAIN. PLEASE NOTIFY THE MICROBIOLOGY DEPARTMENT WITHIN ONE WEEK IF SPECIATION AND SENSITIVITIES ARE REQUIRED. Performed at [facility], [address], [place], [place] [address]    Report Status PENDING  Incomplete  Blood Culture ID Panel (Reflexed)     Status: Abnormal   Collection Time: 12/24/23  5:46 PM  Result Value Ref Range Status   Enterococcus faecalis NOT DETECTED NOT DETECTED Final   Enterococcus Faecium NOT DETECTED NOT DETECTED Final   Listeria monocytogenes NOT DETECTED NOT DETECTED Final   Staphylococcus species DETECTED (A) NOT DETECTED Final    Comment: CRITICAL RESULT CALLED TO, READ BACK BY AND VERIFIED WITH: RN [employer] [id number] AT 1638 BY EC    Staphylococcus aureus (BCID) NOT DETECTED NOT DETECTED Final   Staphylococcus epidermidis NOT DETECTED NOT DETECTED Final   Staphylococcus lugdunensis NOT DETECTED NOT DETECTED Final   Streptococcus species  NOT DETECTED NOT DETECTED Final   Streptococcus agalactiae NOT DETECTED NOT DETECTED Final   Streptococcus pneumoniae NOT DETECTED NOT DETECTED Final   Streptococcus pyogenes NOT DETECTED NOT DETECTED Final   A.calcoaceticus-baumannii NOT DETECTED NOT DETECTED Final   Bacteroides fragilis NOT DETECTED NOT DETECTED Final   Enterobacterales NOT DETECTED NOT DETECTED Final   Enterobacter cloacae complex NOT DETECTED NOT DETECTED Final   Escherichia coli NOT DETECTED NOT DETECTED Final   Klebsiella aerogenes NOT DETECTED NOT DETECTED Final   Klebsiella oxytoca NOT DETECTED NOT DETECTED Final   Klebsiella pneumoniae NOT DETECTED NOT DETECTED Final   Proteus species NOT DETECTED NOT DETECTED Final   Salmonella species NOT DETECTED NOT DETECTED Final   Serratia marcescens NOT DETECTED NOT DETECTED Final   Haemophilus influenzae NOT DETECTED NOT DETECTED Final   Neisseria meningitidis NOT  DETECTED NOT DETECTED Final   Pseudomonas aeruginosa NOT DETECTED NOT DETECTED Final   Stenotrophomonas maltophilia NOT DETECTED NOT DETECTED Final   Candida albicans NOT DETECTED NOT DETECTED Final   Candida auris NOT DETECTED NOT DETECTED Final   Candida glabrata NOT DETECTED NOT DETECTED Final   Candida krusei NOT DETECTED NOT DETECTED Final   Candida parapsilosis NOT DETECTED NOT DETECTED Final   Candida tropicalis NOT DETECTED NOT DETECTED Final   Cryptococcus neoformans/gattii NOT DETECTED NOT DETECTED Final    Comment: Performed at [facility], [address], [place], [place] [address]  Respiratory (~20 pathogens) panel by PCR     Status: Abnormal   Collection Time: 12/25/23  8:49 AM   Specimen: Nasopharyngeal Swab; Respiratory  Result Value Ref Range Status   Adenovirus NOT DETECTED NOT DETECTED Final   Coronavirus 229E NOT DETECTED NOT DETECTED Final    Comment: (NOTE) The Coronavirus on the Respiratory Panel, DOES NOT test for the novel  Coronavirus (2019 nCoV)    Coronavirus HKU1 NOT  DETECTED NOT DETECTED Final   Coronavirus NL63 NOT DETECTED NOT DETECTED Final   Coronavirus OC43 NOT DETECTED NOT DETECTED Final   Metapneumovirus NOT DETECTED NOT DETECTED Final   Rhinovirus / Enterovirus DETECTED (A) NOT DETECTED Final   Influenza A NOT DETECTED NOT DETECTED Final   Influenza B NOT DETECTED NOT DETECTED Final   Parainfluenza Virus 1 NOT DETECTED NOT DETECTED Final   Parainfluenza Virus 2 NOT DETECTED NOT DETECTED Final   Parainfluenza Virus 3 NOT DETECTED NOT DETECTED Final   Parainfluenza Virus 4 NOT DETECTED NOT DETECTED Final   Respiratory Syncytial Virus NOT DETECTED NOT DETECTED Final   Bordetella pertussis NOT DETECTED NOT DETECTED Final   Bordetella Parapertussis NOT DETECTED NOT DETECTED Final   Chlamydophila pneumoniae NOT DETECTED NOT DETECTED Final   Mycoplasma pneumoniae NOT DETECTED NOT DETECTED Final    Comment: Performed at [facility], [address], [place], [place] [address]  MRSA Next Gen by PCR, Nasal     Status: None   Collection Time: 12/25/23  8:49 AM   Specimen: Nasal Mucosa; Nasal Swab  Result Value Ref Range Status   MRSA by PCR Next Gen NOT DETECTED NOT DETECTED Final    Comment: (NOTE) The GeneXpert MRSA Assay (FDA approved for NASAL specimens only), is one component of a comprehensive MRSA colonization surveillance program. It is not intended to diagnose MRSA infection nor to guide or monitor treatment for MRSA infections. Test performance is not FDA approved in patients less than 27 years old. Performed at [facility], [address], [place], [place] [address]     Labs: CBC: Recent Labs  Lab 12/24/23 1743 12/25/23 0213  WBC 12.0* 11.4*  NEUTROABS 9.8*  --   HGB 12.9 11.3*  HCT 40.7 34.9*  MCV 95.3 96.1  PLT 238 230   Basic Metabolic Panel: Recent Labs  Lab 12/24/23 1743 12/25/23 0213 12/26/23 0239 12/27/23 0447  NA 134* 136 138 139  K 3.5 3.0* 3.5 3.6  CL 93* 101 103 104  CO2 27 25 26 28   GLUCOSE 146*  103* 172* 162*  BUN 17 14 20  29*  CREATININE 1.05* 0.80 0.70 0.84  CALCIUM 8.6* 7.9* 8.3* 8.4*  MG  --  1.5* 2.2 2.1  PHOS  --  2.0* 2.9  --    Liver Function Tests: Recent Labs  Lab 12/24/23 1743 12/25/23 0213  AST 22 18  ALT 16 12  ALKPHOS  88 75  BILITOT 1.2 1.1  PROT 7.0 5.8*  ALBUMIN 2.9* 2.4*   CBG: No results for input(s): "GLUCAP" in the last 168 hours.  Discharge time spent: greater than 30 minutes.  Signed: Demaris Fillers, MD [facility] 12/27/2023

## 2023-12-27 NOTE — Plan of Care (Signed)

## 2023-12-27 NOTE — Progress Notes (Addendum)
 SATURATION QUALIFICATIONS: (This note is used to comply with regulatory documentation for home oxygen)   Patient Saturations on Room Air at Rest = 92   Patient Saturations on Room Air while Ambulating = 81   Patient Saturations on 2 Liters of oxygen while Ambulating = 96   Please briefly explain why patient needs home oxygen: To maintain 02 sat at 90% or above during ambulation.  Demaris Fillers, DO

## 2023-12-27 NOTE — TOC Transition Note (Signed)
 Transition of Care [facility]) - Discharge Note   Patient Details  Name: Mary Moss MRN: [id number] Date of Birth: Dec 25, 1935  Transition [facility] CM/SW Contact:  Geraldina Klinefelter, RN Phone Number: 12/27/2023, 10:33 AM   Clinical Narrative:    Pt discharging to home - self care, with family support. Home oxygen arranged with [employer]. [employer] rep notified and will coordinate delivery with patient. TOC signing off.    Final next level of care: Home/Self Care Barriers to Discharge: Continued Medical Work up   Patient Goals and CMS Choice Patient states their goals for this hospitalization and ongoing recovery are:: Discharge home CMS Medicare.gov Compare Post Acute Care list provided to:: Patient        Discharge Placement                       Discharge Plan and Services Additional resources added to the After Visit Summary for                                       Social Drivers of Health (SDOH) Interventions SDOH Screenings   Food Insecurity: No Food Insecurity (12/24/2023)  Housing: Low Risk  (12/24/2023)  Transportation Needs: No Transportation Needs (12/24/2023)  Utilities: Not At Risk (12/24/2023)  Depression (PHQ2-9): Medium Risk (01/30/2022)  Financial Resource Strain: Low Risk  (09/13/2019)  Physical Activity: Inactive (09/13/2019)  Social Connections: Moderately Integrated (12/24/2023)  Stress: No Stress Concern Present (09/13/2019)  Tobacco Use: Medium Risk (12/24/2023)     Readmission Risk Interventions    09/22/2023   10:50 AM  Readmission Risk Prevention Plan  Post Dischage Appt Complete  Medication Screening Complete

## 2023-12-27 NOTE — Progress Notes (Signed)
 Mobility Specialist Progress Note:    12/27/23 0930  Mobility  Activity Refused mobility   Pt politely refused mobility, eager for discharge. All needs met.   Glinda Lapping Mobility Specialist Please contact via Special educational needs teacher or  Rehab office at [phone]

## 2023-12-27 NOTE — Care Management Important Message (Signed)
 Important Message  Patient Details  Name: Mary Moss MRN: [id number] Date of Birth: March 14, 1936   Important Message Given:  N/A - LOS <3 / Initial given by admissions     [name] 12/27/2023, 11:30 AM

## 2023-12-29 LAB — CULTURE, BLOOD (ROUTINE X 2)
Culture: NO GROWTH
Special Requests: ADEQUATE

## 2024-01-03 DIAGNOSIS — D696 Thrombocytopenia, unspecified: Secondary | ICD-10-CM | POA: Diagnosis not present

## 2024-01-03 DIAGNOSIS — K5900 Constipation, unspecified: Secondary | ICD-10-CM | POA: Diagnosis not present

## 2024-01-03 DIAGNOSIS — J9601 Acute respiratory failure with hypoxia: Secondary | ICD-10-CM | POA: Diagnosis not present

## 2024-01-03 DIAGNOSIS — I129 Hypertensive chronic kidney disease with stage 1 through stage 4 chronic kidney disease, or unspecified chronic kidney disease: Secondary | ICD-10-CM | POA: Diagnosis not present

## 2024-01-03 DIAGNOSIS — N1831 Chronic kidney disease, stage 3a: Secondary | ICD-10-CM | POA: Diagnosis not present

## 2024-01-03 DIAGNOSIS — E876 Hypokalemia: Secondary | ICD-10-CM | POA: Diagnosis not present

## 2024-01-03 DIAGNOSIS — R768 Other specified abnormal immunological findings in serum: Secondary | ICD-10-CM | POA: Diagnosis not present

## 2024-01-03 DIAGNOSIS — J189 Pneumonia, unspecified organism: Secondary | ICD-10-CM | POA: Diagnosis not present

## 2024-01-03 DIAGNOSIS — I4891 Unspecified atrial fibrillation: Secondary | ICD-10-CM | POA: Diagnosis not present

## 2024-01-03 DIAGNOSIS — K254 Chronic or unspecified gastric ulcer with hemorrhage: Secondary | ICD-10-CM | POA: Diagnosis not present

## 2024-01-03 DIAGNOSIS — R6520 Severe sepsis without septic shock: Secondary | ICD-10-CM | POA: Diagnosis not present

## 2024-01-03 DIAGNOSIS — A419 Sepsis, unspecified organism: Secondary | ICD-10-CM | POA: Diagnosis not present

## 2024-01-03 DIAGNOSIS — M1A9XX0 Chronic gout, unspecified, without tophus (tophi): Secondary | ICD-10-CM | POA: Diagnosis not present

## 2024-01-04 DIAGNOSIS — L409 Psoriasis, unspecified: Secondary | ICD-10-CM | POA: Diagnosis not present

## 2024-01-04 DIAGNOSIS — M0579 Rheumatoid arthritis with rheumatoid factor of multiple sites without organ or systems involvement: Secondary | ICD-10-CM | POA: Diagnosis not present

## 2024-01-04 DIAGNOSIS — M1991 Primary osteoarthritis, unspecified site: Secondary | ICD-10-CM | POA: Diagnosis not present

## 2024-01-04 DIAGNOSIS — E669 Obesity, unspecified: Secondary | ICD-10-CM | POA: Diagnosis not present

## 2024-01-04 DIAGNOSIS — Z6831 Body mass index (BMI) 31.0-31.9, adult: Secondary | ICD-10-CM | POA: Diagnosis not present

## 2024-01-04 DIAGNOSIS — M1A09X0 Idiopathic chronic gout, multiple sites, without tophus (tophi): Secondary | ICD-10-CM | POA: Diagnosis not present

## 2024-01-04 DIAGNOSIS — Z79899 Other long term (current) drug therapy: Secondary | ICD-10-CM | POA: Diagnosis not present

## 2024-01-18 DIAGNOSIS — I4891 Unspecified atrial fibrillation: Secondary | ICD-10-CM | POA: Diagnosis not present

## 2024-01-18 DIAGNOSIS — R0902 Hypoxemia: Secondary | ICD-10-CM | POA: Diagnosis not present

## 2024-01-18 DIAGNOSIS — J189 Pneumonia, unspecified organism: Secondary | ICD-10-CM | POA: Diagnosis not present

## 2024-01-18 DIAGNOSIS — R058 Other specified cough: Secondary | ICD-10-CM | POA: Diagnosis not present

## 2024-01-18 DIAGNOSIS — D649 Anemia, unspecified: Secondary | ICD-10-CM | POA: Diagnosis not present

## 2024-01-18 DIAGNOSIS — R062 Wheezing: Secondary | ICD-10-CM | POA: Diagnosis not present

## 2024-01-18 DIAGNOSIS — K254 Chronic or unspecified gastric ulcer with hemorrhage: Secondary | ICD-10-CM | POA: Diagnosis not present

## 2024-01-19 ENCOUNTER — Other Ambulatory Visit: Payer: Self-pay | Admitting: Family Medicine

## 2024-01-19 DIAGNOSIS — J189 Pneumonia, unspecified organism: Secondary | ICD-10-CM

## 2024-01-24 ENCOUNTER — Encounter (INDEPENDENT_AMBULATORY_CARE_PROVIDER_SITE_OTHER): Payer: Self-pay | Admitting: Gastroenterology

## 2024-01-24 ENCOUNTER — Ambulatory Visit (INDEPENDENT_AMBULATORY_CARE_PROVIDER_SITE_OTHER): Admitting: Gastroenterology

## 2024-01-24 VITALS — BP 132/71 | HR 83 | Temp 97.3°F | Ht 68.0 in | Wt 211.0 lb

## 2024-01-24 DIAGNOSIS — R195 Other fecal abnormalities: Secondary | ICD-10-CM | POA: Insufficient documentation

## 2024-01-24 DIAGNOSIS — R194 Change in bowel habit: Secondary | ICD-10-CM | POA: Insufficient documentation

## 2024-01-24 NOTE — Patient Instructions (Addendum)
 Perform blood workup Perform stool workup Start Benefiber fiber supplements 1-2 tablespoons daily to increase stool bulk Keep a dietary diary

## 2024-01-24 NOTE — Progress Notes (Signed)
 Toribio Fortune, M.D. Gastroenterology & Hepatology [facility] Gastroenterology [address] [place], [place] [address]  Primary Care Physician: Nichole Senior, MD [address] [place] [address]  I will communicate my assessment and recommendations to the referring MD via EMR.  Problems: Change in bowel habits NSAID induced gastric ulcers Erythematous duodenopathy   History of Present Illness: Mary Moss is a 88 y.o. female with history of recent hip replacement, GERD, hypertension, basal cell carcinoma and recent NSAID induced gastric ulcers, who presents for evaluation of change in bowel movements.  The patient was last seen on 02/23/2022. At that time, the patient is scheduled for an EGD and was continued on pantoprazole  40 mg twice a day, advised to avoid using NSAIDs.  After her most recent esophagogastroduodenospy she was started on Protonix  40 mg daily.  Patient reports that for he last 2-3 months she has presented a change in her bowel movements. States that every single time she eats, she needs to have a BM and has fecal urgency. She reports that . No nighttime symptoms. States that the stools are very soft but not watery.  The patient denies having any nausea, vomiting, fever, chills, hematochezia, melena, hematemesis, abdominal distention, abdominal pain, diarrhea, jaundice, pruritus or weight loss.  States she had pneumonia and the flu at the beginning of the year. However, she had this after her bowels changed.  Has not changed her diet in terms of variety, but is not eating as much as before.  Notably, the patient was on leflunomide  but was recently discontinued at the beginning of June by her rheumatologist due to concern of medication induced diarrhea.  Has not taken any NSAIDs, only takes Tylenol . No supplements or other OTC medications.  Last EGD: 04/07/2022 - 1 cm hiatal hernia. - Scar in the gastric antrum. - Normal  examined duodenum. - No specimens collected.  Previous EGD 01/21/2022  1 cm hiatal hernia, Forrest III gastric ulcers which were biopsied and negative for H. pylori or dysplasia.  There was presence of erythematous duodenopathy.    Last Colonoscopy:01/21/2022  diverticulosis and nonbleeding internal hemorrhoids.   Past Medical History: Past Medical History:  Diagnosis Date   Ankle fracture fx 13 yrs ago, anklw swells off and on, has cramps in ankle also   left, to seee dr hewitt about 02-16-14   Arthritis    osteo and RA   Basal cell carcinoma 2015   generalized   Difficult intubation    GERD (gastroesophageal reflux disease)    OTC med   pt. denies   Hypertension    Pneumonia    Pre-diabetes     Past Surgical History: Past Surgical History:  Procedure Laterality Date   ABDOMINAL HYSTERECTOMY     BIOPSY  01/21/2022   Procedure: BIOPSY;  Surgeon: Fortune Flavors, Toribio, MD;  [facility];  Service: Gastroenterology;;  gastric ulcer biopsies, gastric biopsies;   COLONOSCOPY WITH PROPOFOL  N/A 01/21/2022   Procedure: COLONOSCOPY WITH PROPOFOL ;  Surgeon: Fortune Flavors Toribio, MD;  [facility]: [facility];  Service: Gastroenterology;  Laterality: N/A;   ESOPHAGOGASTRODUODENOSCOPY (EGD) WITH PROPOFOL  N/A 01/21/2022   Procedure: ESOPHAGOGASTRODUODENOSCOPY (EGD) WITH PROPOFOL ;  Surgeon: Fortune Flavors Toribio, MD;  [facility];  Service: Gastroenterology;  Laterality: N/A;   ESOPHAGOGASTRODUODENOSCOPY (EGD) WITH PROPOFOL  N/A 04/07/2022   Procedure: ESOPHAGOGASTRODUODENOSCOPY (EGD) WITH PROPOFOL ;  Surgeon: Fortune Flavors Toribio, MD;  [facility]: [facility];  Service: Gastroenterology;  Laterality: N/A;  1100 ASA 3  EYE SURGERY Bilateral    bilaterla cataract extraction with IOL   FRACTURE SURGERY Left 2001   JOINT REPLACEMENT     KNEE ARTHROSCOPY Right 02/07/2014   Procedure: RIGHT ARTHROSCOPY KNEE, WITH MEDIAL MENISCAL DEBRIDEMENT AND CHONDRAPLASTY;   Surgeon: Dempsey Melodi GAILS, MD;  Location: [facility];  Service: Orthopedics;  Laterality: Right;   ROTATOR CUFF REPAIR Right 2009   TOTAL ANKLE ARTHROPLASTY Left 12/20/2014   Procedure: LEFT TOTAL ANKLE ARTHOPLASTY WITH PERCUTANEOUS ACHILLES TENDON LENGTHENING;  Surgeon: Norleen Armor, MD;  Location: [facility];  Service: Orthopedics;  Laterality: Left;   TOTAL HIP ARTHROPLASTY Left 12/31/2021   Procedure: TOTAL HIP ARTHROPLASTY ANTERIOR APPROACH;  Surgeon: Melodi Dempsey, MD;  Location: [facility];  Service: Orthopedics;  Laterality: Left;   TOTAL KNEE ARTHROPLASTY Right 06/08/2016   Procedure: RIGHT TOTAL KNEE ARTHROPLASTY;  Surgeon: Dempsey Melodi, MD;  Location: [facility];  Service: Orthopedics;  Laterality: Right;   TOTAL SHOULDER ARTHROPLASTY Right 11/23/2019   Procedure: REVERSE TOTAL SHOULDER ARTHROPLASTY;  Surgeon: Dozier Soulier, MD;  Location: [facility];  Service: Orthopedics;  Laterality: Right;    Family History: Family History  Problem Relation Age of Onset   Cancer Mother        colon   Cancer Father        lung   Cancer Sister        lung cancer-nonsmoker   Alzheimer's disease Brother 26    Social History: Social History   Tobacco Use  Smoking Status Former   Current packs/day: 0.00   Average packs/day: 0.5 packs/day for 50.4 years (25.2 ttl pk-yrs)   Types: Cigarettes   Start date: 07/27/1957   Quit date: 12/15/2007   Years since quitting: 16.1   Passive exposure: Past  Smokeless Tobacco Never   Social History   Substance and Sexual Activity  Alcohol  Use Not Currently   Comment: rare   Social History   Substance and Sexual Activity  Drug Use No    Allergies: Allergies  Allergen Reactions   Statins Other (See Comments)    Simvastatin, crestor, livalo Not able to walk because so much muscle/joint pain   Bacitracin-Polymyxin B Rash   Neosporin [Neomycin-Bacitracin Zn-Polymyx] Rash    Medications: Current Outpatient Medications  Medication Sig Dispense Refill    acetaminophen  (TYLENOL ) 325 MG tablet Take 2 tablets (650 mg total) by mouth every 6 (six) hours as needed for mild pain (or Fever >/= 101). 15 tablet 0   Albuterol  Sulfate (PROAIR  RESPICLICK) 108 (90 Base) MCG/ACT AEPB Inhale 1-2 puffs into the lungs every 6 (six) hours as needed (shortness of breath).     allopurinol  (ZYLOPRIM ) 100 MG tablet Take 100 mg by mouth daily.     D3-50 1.25 MG (50000 UT) capsule Take 50,000 Units by mouth every Sunday.     diphenhydramine -acetaminophen  (TYLENOL  PM EXTRA STRENGTH) 25-500 MG TABS tablet Take 1 tablet by mouth at bedtime.     Fluticasone-Umeclidin-Vilant (TRELEGY ELLIPTA IN) Inhale into the lungs daily at 6 (six) AM.     hydrochlorothiazide  (HYDRODIURIL ) 25 MG tablet Take 25 mg by mouth daily.     hydroxychloroquine  (PLAQUENIL ) 200 MG tablet Take 400 mg by mouth 2 (two) times daily. (Patient taking differently: Take 400 mg by mouth daily.)     levofloxacin  (LEVAQUIN ) 500 MG tablet Take 500 mg by mouth daily.     Metoprolol  Tartrate 37.5 MG TABS Take 1 tablet (37.5 mg total) by mouth 2 (two) times daily. 180 tablet 3   predniSONE  (  DELTASONE ) 20 MG tablet Take 20 mg by mouth daily with breakfast.     No current facility-administered medications for this visit.    Review of Systems: GENERAL: negative for malaise, night sweats HEENT: No changes in hearing or vision, no nose bleeds or other nasal problems. NECK: Negative for lumps, goiter, pain and significant neck swelling RESPIRATORY: Negative for cough, wheezing CARDIOVASCULAR: Negative for chest pain, leg swelling, palpitations, orthopnea GI: SEE HPI MUSCULOSKELETAL: Negative for joint pain or swelling, back pain, and muscle pain. SKIN: Negative for lesions, rash PSYCH: Negative for sleep disturbance, mood disorder and recent psychosocial stressors. HEMATOLOGY Negative for prolonged bleeding, bruising easily, and swollen nodes. ENDOCRINE: Negative for cold or heat intolerance, polyuria, polydipsia  and goiter. NEURO: negative for tremor, gait imbalance, syncope and seizures. The remainder of the review of systems is noncontributory.   Physical Exam: BP 132/71 (BP Location: Left Arm, Patient Position: Sitting, Cuff Size: Large)   Pulse 83   Temp (!) 97.3 F (36.3 C) (Temporal)   Ht 5' 8 (1.727 m)   Wt 211 lb (95.7 kg)   BMI 32.08 kg/m  GENERAL: The patient is AO x3, in no acute distress. HEENT: Head is normocephalic and atraumatic. EOMI are intact. Mouth is well hydrated and without lesions. NECK: Supple. No masses LUNGS: Clear to auscultation. No presence of rhonchi/wheezing/rales. Adequate chest expansion HEART: RRR, normal s1 and s2. ABDOMEN: Soft, nontender, no guarding, no peritoneal signs, and nondistended. BS +. No masses. EXTREMITIES: Without any cyanosis, clubbing, rash, lesions or edema. NEUROLOGIC: AOx3, no focal motor deficit. SKIN: no jaundice, no rashes  Imaging/Labs: as above  I personally reviewed and interpreted the available labs, imaging and endoscopic files.  Impression and Plan: Mary Moss is a 88 y.o. female with history of recent hip replacement, GERD, hypertension, basal cell carcinoma and recent NSAID induced gastric ulcers, who presents for evaluation of change in bowel movements.  Patient had new onset of changes in her bowel movement consistency and frequency recently.  She has not changed her diet recently and she has not started any new medications/over-the-counter medicines or supplements.  However, it is not clear if the change in her bowel habits are related to dietary influence.  Due to this, I advised her to keep a dietary diary 25 potential triggers of her symptoms.  She will benefit from starting bulking agents such as Benefiber.  We will evaluate for other etiologies with serologies and stool workup.  -Check celiac disease panel and TSH - Check pancreatic elastase, fecal fat and GI pathogen panel -Start Benefiber fiber supplements  1-2 tablespoons daily to increase stool bulk -Keep a dietary diary  All questions were answered.      Toribio Fortune, MD Gastroenterology and Hepatology [facility] Gastroenterology

## 2024-01-25 LAB — TSH: TSH: 2.32 m[IU]/L (ref 0.40–4.50)

## 2024-01-25 LAB — CELIAC DISEASE PANEL
(tTG) Ab, IgA: <1 U/mL
(tTG) Ab, IgG: <1 U/mL
Gliadin IgA: <1 U/mL
Gliadin IgG: <1 U/mL
Immunoglobulin A: 366 mg/dL — ABNORMAL HIGH (ref 70–320)

## 2024-01-26 ENCOUNTER — Ambulatory Visit
Admission: RE | Admit: 2024-01-26 | Discharge: 2024-01-26 | Disposition: A | Discharge status: Home / Self Care | Source: Ambulatory Visit | Attending: Family Medicine | Admitting: Family Medicine

## 2024-01-26 DIAGNOSIS — R195 Other fecal abnormalities: Secondary | ICD-10-CM | POA: Diagnosis not present

## 2024-01-26 DIAGNOSIS — R194 Change in bowel habit: Secondary | ICD-10-CM | POA: Diagnosis not present

## 2024-01-26 DIAGNOSIS — J189 Pneumonia, unspecified organism: Secondary | ICD-10-CM

## 2024-01-26 DIAGNOSIS — R918 Other nonspecific abnormal finding of lung field: Secondary | ICD-10-CM | POA: Diagnosis not present

## 2024-01-26 DIAGNOSIS — J449 Chronic obstructive pulmonary disease, unspecified: Secondary | ICD-10-CM | POA: Diagnosis not present

## 2024-01-26 MED ORDER — IOPAMIDOL (ISOVUE-300) INJECTION 61%
80.0000 mL | Freq: Once | INTRAVENOUS | Status: AC | PRN
  Administered 2024-01-26: 80 mL via INTRAVENOUS

## 2024-02-01 ENCOUNTER — Telehealth (INDEPENDENT_AMBULATORY_CARE_PROVIDER_SITE_OTHER): Payer: Self-pay | Admitting: Gastroenterology

## 2024-02-01 DIAGNOSIS — R195 Other fecal abnormalities: Secondary | ICD-10-CM

## 2024-02-01 DIAGNOSIS — R194 Change in bowel habit: Secondary | ICD-10-CM

## 2024-02-01 NOTE — Telephone Encounter (Signed)
 Thanks you!

## 2024-02-01 NOTE — Telephone Encounter (Signed)
 Fax received from [facility] stating that GI Path PCR test was not performed due to collection tube is incorrectly filled. Contacted pt to let her know to that test was completed incorrectly and that she would need to go back to [facility] to have it recollected. Pt verbalized understanding. New lab order placed.

## 2024-02-03 ENCOUNTER — Ambulatory Visit (INDEPENDENT_AMBULATORY_CARE_PROVIDER_SITE_OTHER): Payer: Self-pay | Admitting: Gastroenterology

## 2024-02-03 LAB — FECAL FAT, QUALITATIVE: FECAL FAT, QUALITATIVE: NORMAL

## 2024-02-03 LAB — GASTROINTESTINAL PATHOGEN PNL

## 2024-02-03 LAB — PANCREATIC ELASTASE, FECAL: Pancreatic Elastase-1, Stool: >800 ug/g (ref >200)

## 2024-02-09 DIAGNOSIS — L400 Psoriasis vulgaris: Secondary | ICD-10-CM | POA: Diagnosis not present

## 2024-02-09 DIAGNOSIS — D485 Neoplasm of uncertain behavior of skin: Secondary | ICD-10-CM | POA: Diagnosis not present

## 2024-02-09 DIAGNOSIS — L821 Other seborrheic keratosis: Secondary | ICD-10-CM | POA: Diagnosis not present

## 2024-02-09 DIAGNOSIS — L570 Actinic keratosis: Secondary | ICD-10-CM | POA: Diagnosis not present

## 2024-02-09 DIAGNOSIS — Z85828 Personal history of other malignant neoplasm of skin: Secondary | ICD-10-CM | POA: Diagnosis not present

## 2024-02-09 DIAGNOSIS — L814 Other melanin hyperpigmentation: Secondary | ICD-10-CM | POA: Diagnosis not present

## 2024-02-09 DIAGNOSIS — C44612 Basal cell carcinoma of skin of right upper limb, including shoulder: Secondary | ICD-10-CM | POA: Diagnosis not present

## 2024-02-09 DIAGNOSIS — D692 Other nonthrombocytopenic purpura: Secondary | ICD-10-CM | POA: Diagnosis not present

## 2024-02-09 DIAGNOSIS — C44319 Basal cell carcinoma of skin of other parts of face: Secondary | ICD-10-CM | POA: Diagnosis not present

## 2024-02-14 ENCOUNTER — Telehealth (INDEPENDENT_AMBULATORY_CARE_PROVIDER_SITE_OTHER): Payer: Self-pay

## 2024-02-14 ENCOUNTER — Ambulatory Visit: Payer: Self-pay | Admitting: Cardiology

## 2024-02-14 NOTE — Telephone Encounter (Signed)
 Thanks

## 2024-02-14 NOTE — Telephone Encounter (Signed)
 Patient called today stating she had recollected the Gi path panel and when she turned it in the girl at the lab told her she had over filled the tube, but did not tell her whether or not it was still sent off for testing. I called [facility] customer service spoke with Harlene there whom says she only sees the GI path panel that was cancelled on 01/26/2024 and no other Gi path panel was listed as being in process. I called the patient back and made her aware the lab did not keep the second sample she had taken back to them last week. I advised that she would have to go back and ask for another tube for collection , and advised that if the stool had any form to it the lab would cancel it again. I did ask the patient to find out from [facility] how she needs to collect the sample in the future. Patient states understanding.

## 2024-02-15 ENCOUNTER — Other Ambulatory Visit (HOSPITAL_COMMUNITY): Payer: Self-pay | Admitting: Registered Nurse

## 2024-02-15 ENCOUNTER — Ambulatory Visit (HOSPITAL_COMMUNITY)
Admission: RE | Admit: 2024-02-15 | Discharge: 2024-02-15 | Disposition: A | Discharge status: Home / Self Care | Source: Ambulatory Visit | Attending: Vascular Surgery | Admitting: Vascular Surgery

## 2024-02-15 DIAGNOSIS — M25471 Effusion, right ankle: Secondary | ICD-10-CM | POA: Insufficient documentation

## 2024-02-15 DIAGNOSIS — M25474 Effusion, right foot: Secondary | ICD-10-CM | POA: Insufficient documentation

## 2024-02-15 DIAGNOSIS — R768 Other specified abnormal immunological findings in serum: Secondary | ICD-10-CM | POA: Diagnosis not present

## 2024-02-15 DIAGNOSIS — N1831 Chronic kidney disease, stage 3a: Secondary | ICD-10-CM | POA: Diagnosis not present

## 2024-02-15 DIAGNOSIS — M25475 Effusion, left foot: Secondary | ICD-10-CM | POA: Insufficient documentation

## 2024-02-15 DIAGNOSIS — L03115 Cellulitis of right lower limb: Secondary | ICD-10-CM | POA: Diagnosis not present

## 2024-02-15 DIAGNOSIS — I129 Hypertensive chronic kidney disease with stage 1 through stage 4 chronic kidney disease, or unspecified chronic kidney disease: Secondary | ICD-10-CM | POA: Diagnosis not present

## 2024-02-15 DIAGNOSIS — D696 Thrombocytopenia, unspecified: Secondary | ICD-10-CM | POA: Diagnosis not present

## 2024-02-15 DIAGNOSIS — L03116 Cellulitis of left lower limb: Secondary | ICD-10-CM | POA: Diagnosis not present

## 2024-02-15 DIAGNOSIS — R600 Localized edema: Secondary | ICD-10-CM | POA: Diagnosis not present

## 2024-02-15 DIAGNOSIS — M25472 Effusion, left ankle: Secondary | ICD-10-CM | POA: Insufficient documentation

## 2024-02-15 DIAGNOSIS — M1A9XX0 Chronic gout, unspecified, without tophus (tophi): Secondary | ICD-10-CM | POA: Diagnosis not present

## 2024-02-15 DIAGNOSIS — J189 Pneumonia, unspecified organism: Secondary | ICD-10-CM | POA: Diagnosis not present

## 2024-02-15 DIAGNOSIS — I4891 Unspecified atrial fibrillation: Secondary | ICD-10-CM | POA: Diagnosis not present

## 2024-02-23 DIAGNOSIS — R194 Change in bowel habit: Secondary | ICD-10-CM | POA: Diagnosis not present

## 2024-02-23 DIAGNOSIS — R195 Other fecal abnormalities: Secondary | ICD-10-CM | POA: Diagnosis not present

## 2024-02-25 LAB — GASTROINTESTINAL PATHOGEN PNL
CampyloBacter Group: NOT DETECTED
Norovirus GI/GII: NOT DETECTED
Rotavirus A: NOT DETECTED
Salmonella species: NOT DETECTED
Shiga Toxin 1: NOT DETECTED
Shiga Toxin 2: NOT DETECTED
Shigella Species: NOT DETECTED
Vibrio Group: NOT DETECTED
Yersinia enterocolitica: NOT DETECTED

## 2024-02-26 DIAGNOSIS — J449 Chronic obstructive pulmonary disease, unspecified: Secondary | ICD-10-CM | POA: Diagnosis not present

## 2024-02-29 ENCOUNTER — Ambulatory Visit: Payer: Self-pay | Admitting: Gastroenterology

## 2024-03-13 DIAGNOSIS — E559 Vitamin D deficiency, unspecified: Secondary | ICD-10-CM | POA: Diagnosis not present

## 2024-03-13 DIAGNOSIS — N1831 Chronic kidney disease, stage 3a: Secondary | ICD-10-CM | POA: Diagnosis not present

## 2024-03-13 DIAGNOSIS — I129 Hypertensive chronic kidney disease with stage 1 through stage 4 chronic kidney disease, or unspecified chronic kidney disease: Secondary | ICD-10-CM | POA: Diagnosis not present

## 2024-03-13 DIAGNOSIS — E785 Hyperlipidemia, unspecified: Secondary | ICD-10-CM | POA: Diagnosis not present

## 2024-03-13 DIAGNOSIS — R7309 Other abnormal glucose: Secondary | ICD-10-CM | POA: Diagnosis not present

## 2024-03-13 DIAGNOSIS — R946 Abnormal results of thyroid function studies: Secondary | ICD-10-CM | POA: Diagnosis not present

## 2024-03-13 DIAGNOSIS — D649 Anemia, unspecified: Secondary | ICD-10-CM | POA: Diagnosis not present

## 2024-03-14 DIAGNOSIS — C44319 Basal cell carcinoma of skin of other parts of face: Secondary | ICD-10-CM | POA: Diagnosis not present

## 2024-03-14 DIAGNOSIS — Z85828 Personal history of other malignant neoplasm of skin: Secondary | ICD-10-CM | POA: Diagnosis not present

## 2024-03-16 ENCOUNTER — Ambulatory Visit (HOSPITAL_COMMUNITY)
Admission: RE | Admit: 2024-03-16 | Discharge: 2024-03-16 | Disposition: A | Discharge status: Home / Self Care | Source: Ambulatory Visit | Attending: Internal Medicine | Admitting: Internal Medicine

## 2024-03-16 ENCOUNTER — Encounter: Payer: Self-pay | Admitting: Internal Medicine

## 2024-03-16 ENCOUNTER — Ambulatory Visit (INDEPENDENT_AMBULATORY_CARE_PROVIDER_SITE_OTHER): Admitting: Internal Medicine

## 2024-03-16 VITALS — BP 131/85 | HR 68 | Ht 68.0 in | Wt 212.4 lb

## 2024-03-16 DIAGNOSIS — I700 Atherosclerosis of aorta: Secondary | ICD-10-CM | POA: Diagnosis not present

## 2024-03-16 DIAGNOSIS — R0609 Other forms of dyspnea: Secondary | ICD-10-CM | POA: Insufficient documentation

## 2024-03-16 DIAGNOSIS — J9611 Chronic respiratory failure with hypoxia: Secondary | ICD-10-CM

## 2024-03-16 DIAGNOSIS — Z96611 Presence of right artificial shoulder joint: Secondary | ICD-10-CM | POA: Diagnosis not present

## 2024-03-16 DIAGNOSIS — R0602 Shortness of breath: Secondary | ICD-10-CM | POA: Diagnosis not present

## 2024-03-16 NOTE — Progress Notes (Signed)
 Mary Moss, female    DOB: 1935-08-23    MRN: [id number]   Brief patient profile:  56  yowf  quit smoking 2009 s respiratory  dx RA around 2022  referred to pulmonary clinic in [place]  03/16/2024 by Dr Nichole  for  persistent hypoxemia p CAP admit  Admit date:     12/24/2023  Discharge date: 12/27/23   Hospital Course: 88 y/o female with history of hypertension, GERD/PUD, CKD stage III, seropositive rheumatoid arthritis, hyperlipidemia, impaired glucose tolerance, and vitamin D  deficiency presenting with 2-day history of generalized weakness, nonproductive cough, nasal congestion,  myalgias, arthralgias, and fevers and chills. She went to  urgent care about 3 weeks before this admission and was placed on an abx and prednisone  and she improved.  However, her symptoms worsened again 2 days prior to admission.  She went to urgent care again and she was found to be hypoxic and sent to the ED for further evaluation.   She denies cp, hemoptysis, n/v/d, abd pain, dysuria, headache.   In the ED she had a low grade temp of 99.2, but was hemodynamically stable.  Her oxygen  saturation was 89% on RA and she was placed on 2L with saturation 94-96%.  She was given IV fluids and started on ceftriaxone  and azithromycin .  CTA chest showed bilateral LL consolidation without edema or effusions.     The patient was started on bronchodilators IV Solu-Medrol .  She was continued on ceftriaxone  and azithromycin .  She improved clinically.  On day of discharge, ambulatory pulse ox on room air showed desaturation down to 81%.  She was discharged home on 2 L nasal cannula.   Assessment and Plan: Severe Sepsis -due to pneumonia -presented with tachycardia, leukocytosis -lactic peaked 3.1 -continue ceftriaxone  and azithro -initially on IVF -sepsis physiology resovled   Acute respiratory failure with hypoxia -Secondary to pneumonia -Initially placed on 2L -Wean oxygen  for saturation greater 90% -discharge  home with 2L    Lobar Pneumonia/COPD Exacerbation -she has 40 pack year hx of tobacco, quit 16 yr ago -continue xopenex  -added yupelri  -continuee pulmicort  -continued brovana  -continue IV solumedrol -5/30 CTA chest as discussed above -continue ceftriaxone  and azithro -d/c home with cefdinir  and azithro x 3 more days -d/c home with prednisone  taper   Paroxysmal atrial fibrillation -developed afib her last hospitalization 08/2023 -had 30 day monitor without recurrent afib 10/26/23 -apixaban  stopped -remains in sinus   GERD -hx of PUD -continue pantoprazole    Seropositive RA -receives golinumab every 2 months--in the past week -restart leflunomide  and hydroxychloroquine    Essential HTN -holding hydrochlorothiazide  to allow BP margin -restart metoprolol  tartrate -BP controlled   Gout -restart allopurinol    Hypokalemia/hypomagnesemia/hypophosphatemia -repleted  Pt not previously seen by PCCM service.    Wt when quit smoking 170 baseline    History of Present Illness  03/16/2024  Pulmonary/ 1st office eval/ [facility] [place] / [place] [facility] @ wt 212 lb on no resp rx  Chief Complaint  Patient presents with   Establish Care    Had pneu and flu - o2 desat DOE  Dyspnea:  walking a block at slow pace flat grade ok Visit s neighbor  3 x weekly / [employer] ok pushing cart whole store slow pace  Cough: none  Sleep: recliner x years x 30 degrees  SABA use: occ use cause I'm supposed to  but doesn't think it does anything / also stopped trelegy didn't think it helped  02: 2lpm hs / none daytime  No obvious day to day or daytime pattern/variability or assoc excess/ purulent sputum or mucus plugs or hemoptysis or cp or chest tightness, subjective wheeze or overt sinus or hb symptoms.    Also denies any obvious fluctuation of symptoms with weather or environmental changes or other aggravating or alleviating factors except as outlined above   No unusual exposure hx or h/o  childhood pna/ asthma or knowledge of premature birth.  Current Allergies, Complete Past Medical History, Past Surgical History, Family History, and Social History were reviewed in [employer] record.  ROS  The following are not active complaints unless bolded Hoarseness, sore throat, dysphagia, dental problems, itching, sneezing,  nasal congestion or discharge of excess mucus or purulent secretions, ear ache,   fever, chills, sweats, unintended wt loss or wt gain, classically pleuritic or exertional cp,  orthopnea pnd or arm/hand swelling  or leg swelling, presyncope, palpitations, abdominal pain, anorexia, nausea, vomiting, diarrhea  or change in bowel habits or change in bladder habits, change in stools or change in urine, dysuria, hematuria,  rash, arthralgias/hands, visual complaints, headache, numbness, weakness or ataxia or problems with walking or coordination,  change in mood or  memory.            Outpatient Medications Prior to Visit  Medication Sig Dispense Refill   acetaminophen  (TYLENOL ) 325 MG tablet Take 2 tablets (650 mg total) by mouth every 6 (six) hours as needed for mild pain (or Fever >/= 101). 15 tablet 0   Albuterol  Sulfate (PROAIR  RESPICLICK) 108 (90 Base) MCG/ACT AEPB Inhale 1-2 puffs into the lungs every 6 (six) hours as needed (shortness of breath).     allopurinol  (ZYLOPRIM ) 100 MG tablet Take 100 mg by mouth daily.     D3-50 1.25 MG (50000 UT) capsule Take 50,000 Units by mouth every Sunday.     diphenhydramine -acetaminophen  (TYLENOL  PM EXTRA STRENGTH) 25-500 MG TABS tablet Take 1 tablet by mouth at bedtime.            hydrochlorothiazide  (HYDRODIURIL ) 25 MG tablet Take 25 mg by mouth daily.     hydroxychloroquine  (PLAQUENIL ) 200 MG tablet Take 400 mg by mouth 2 (two) times daily.     Metoprolol  Tartrate 37.5 MG TABS Take 1 tablet (37.5 mg total) by mouth 2 (two) times daily. 180 tablet 3      Past Medical History:  Diagnosis Date   Ankle  fracture fx 13 yrs ago, anklw swells off and on, has cramps in ankle also   left, to seee dr hewitt about 02-16-14   Arthritis    osteo and RA   Basal cell carcinoma 2015   generalized   Difficult intubation    GERD (gastroesophageal reflux disease)    OTC med   pt. denies   Hypertension    Pneumonia    Pre-diabetes       Objective:     BP 131/85   Pulse 68   Ht 5' 8 (1.727 m)   Wt 212 lb 6.4 oz (96.3 kg)   SpO2 94% Comment: ra  BMI 32.30 kg/m   SpO2: 94 % (ra)  Pleasant amb wf nad    HEENT : Oropharynx  clear   Nasal turbinates nl    NECK :  without  apparent JVD/ palpable Nodes/TM    LUNGS: no acc muscle use,  Min barrel  contour chest wall with bilateral  slightly decreased bs  esp in bases s audible wheeze and  without cough on insp  or exp maneuvers and min  Hyperresonant  to  percussion bilaterally    CV:  RRR  no s3 or murmur or increase in P2, and no edema   ABD:  soft and nontender with pos end  insp Hoover's  in the supine position.  No bruits or organomegaly appreciated   MS:  slow but steady gait/ ext warm without deformities Or obvious joint restrictions  calf tenderness, cyanosis or clubbing     SKIN: warm and dry without lesions    NEURO:  alert, approp, nl sensorium with  no motor or cerebellar deficits apparent.         CXR PA and Lateral:   03/16/2024 :    I personally reviewed images and impression is as follows:     Improving bibasilar atx  esp L base  Assessment   Assessment & Plan Chronic respiratory failure with hypoxia (HCC) See CAP d/c summary 12/27/23 = On day of discharge, ambulatory pulse ox on room air showed desaturation down to 81%>  d/c on 2lpm 24/7 due to -03/16/2024 no significant  desats walking  moderate pace (see below)  Clearly improved radiographically and clinically since d/c and now only needing 02 hs though cautioned re need to monitor going forward at peak ex   Rec: Submax ex as tol IS as much as possible  F/u in  3 m with pfts     DOE (dyspnea on exertion) Quit smoking 2009 at wt 170 - 03/16/2024   Walked @ wt 212 on RA  x  3  lap(s) =  approx 450  ft  @ slow to mod pace, stopped due to end of study  with lowest 02 sats 89% but improved on last lap to 91%  - DOE labs 03/16/2024 pending  - PFTs 03/16/2024 ordered   Pt's baseline doe has paralleled her wt gain unitl abruptly ill with CAP per her rx and now slowly improving back toward baseline save for the issue of borderline hypoxemia which she should monitor as follows  Make sure you check your oxygen  saturation at your highest level of activity(NOT after you stop)  to be sure it stays over 90% and keep track of it at least once a week, more often if breathing getting worse, and let me know if losing ground. (Collect the dots to connect the dots approach)    Each maintenance medication was reviewed in detail including emphasizing most importantly the difference between maintenance and prns and under what circumstances the prns are to be triggered using an action plan format where appropriate.  Total time for H and P, chart review, counseling, reviewing hfa/02 /pulse ox  device(s) , directly observing portions of ambulatory 02 saturation study/ and generating customized AVS unique to this office visit / same day charting = 60 min new pt eval                Patient Instructions  Make sure you check your oxygen  saturation at your highest level of activity(NOT after you stop)  to be sure it stays over 90% and keep track of it at least once a week, more often if breathing getting worse, and let me know if losing ground. (Collect the dots to connect the dots approach)    Continue 2lpm oxygen  at bedtime  Use Incentrive spirometer as much as possible  My office will be contacting you by phone for referral to [facility] for PFTs  - if you don't hear back from my office  within one week please call us  back or notify us  thru MyChart and we'll address it right away.    Please schedule a follow up visit in 3 months but call sooner if needed    Ozell America, MD 03/18/2024

## 2024-03-16 NOTE — Patient Instructions (Addendum)
 Make sure you check your oxygen  saturation at your highest level of activity(NOT after you stop)  to be sure it stays over 90% and keep track of it at least once a week, more often if breathing getting worse, and let me know if losing ground. (Collect the dots to connect the dots approach)    Continue 2lpm oxygen  at bedtime  Use Incentrive spirometer as much as possible  My office will be contacting you by phone for referral to [facility] for PFTs  - if you don't hear back from my office within one week please call us  back or notify us  thru MyChart and we'll address it right away.   Please schedule a follow up visit in 3 months but call sooner if needed

## 2024-03-18 NOTE — Assessment & Plan Note (Addendum)
 See CAP d/c summary 12/27/23 = On day of discharge, ambulatory pulse ox on room air showed desaturation down to 81%>  d/c on 2lpm 24/7 due to -03/16/2024 no significant  desats walking  moderate pace (see below)  Clearly improved radiographically and clinically since d/c and now only needing 02 hs though cautioned re need to monitor going forward at peak ex   Rec: Submax ex as tol IS as much as possible  F/u in 3 m with pfts

## 2024-03-18 NOTE — Assessment & Plan Note (Addendum)
 Quit smoking 2009 at wt 170 - 03/16/2024   Walked @ wt 212 on RA  x  3  lap(s) =  approx 450  ft  @ slow to mod pace, stopped due to end of study  with lowest 02 sats 89% but improved on last lap to 91%  - DOE labs 03/16/2024 pending  - PFTs 03/16/2024 ordered   Pt's baseline doe has paralleled her wt gain unitl abruptly ill with CAP per her rx and now slowly improving back toward baseline save for the issue of borderline hypoxemia which she should monitor as follows  Make sure you check your oxygen  saturation at your highest level of activity(NOT after you stop)  to be sure it stays over 90% and keep track of it at least once a week, more often if breathing getting worse, and let me know if losing ground. (Collect the dots to connect the dots approach)    Each maintenance medication was reviewed in detail including emphasizing most importantly the difference between maintenance and prns and under what circumstances the prns are to be triggered using an action plan format where appropriate.  Total time for H and P, chart review, counseling, reviewing hfa/02 /pulse ox  device(s) , directly observing portions of ambulatory 02 saturation study/ and generating customized AVS unique to this office visit / same day charting = 60 min new pt eval

## 2024-03-21 ENCOUNTER — Ambulatory Visit: Payer: Self-pay | Admitting: Internal Medicine

## 2024-03-21 DIAGNOSIS — Z1231 Encounter for screening mammogram for malignant neoplasm of breast: Secondary | ICD-10-CM | POA: Diagnosis not present

## 2024-03-22 LAB — CBC WITH DIFFERENTIAL/PLATELET
Basophils Absolute: 0.1 x10E3/uL (ref 0.0–0.2)
Basos: 2 %
EOS (ABSOLUTE): 0.4 x10E3/uL (ref 0.0–0.4)
Eos: 7 %
Hematocrit: 39.2 % (ref 34.0–46.6)
Hemoglobin: 12.5 g/dL (ref 11.1–15.9)
Immature Grans (Abs): 0 x10E3/uL (ref 0.0–0.1)
Immature Granulocytes: 0 %
Lymphocytes Absolute: 2.7 x10E3/uL (ref 0.7–3.1)
Lymphs: 46 %
MCH: 30.4 pg (ref 26.6–33.0)
MCHC: 31.9 g/dL (ref 31.5–35.7)
MCV: 95 fL (ref 79–97)
Monocytes Absolute: 0.7 x10E3/uL (ref 0.1–0.9)
Monocytes: 11 %
Neutrophils Absolute: 2 x10E3/uL (ref 1.4–7.0)
Neutrophils: 34 %
Platelets: 247 x10E3/uL (ref 150–450)
RBC: 4.11 x10E6/uL (ref 3.77–5.28)
RDW: 12.7 % (ref 11.7–15.4)
WBC: 5.8 x10E3/uL (ref 3.4–10.8)

## 2024-03-22 LAB — QUANTIFERON-TB GOLD PLUS
QuantiFERON Mitogen Value: >10 [IU]/mL
QuantiFERON Nil Value: 0.06 [IU]/mL
QuantiFERON TB1 Ag Value: 0.06 [IU]/mL
QuantiFERON TB2 Ag Value: 0.06 [IU]/mL
QuantiFERON-TB Gold Plus: NEGATIVE

## 2024-03-22 LAB — ALPHA-1-ANTITRYPSIN PHENOTYP: A-1 Antitrypsin: 183 mg/dL (ref 101–187)

## 2024-03-22 LAB — BASIC METABOLIC PANEL WITH GFR
BUN/Creatinine Ratio: 16 (ref 12–28)
BUN: 16 mg/dL (ref 8–27)
CO2: 23 mmol/L (ref 20–29)
Calcium: 9.4 mg/dL (ref 8.7–10.3)
Chloride: 101 mmol/L (ref 96–106)
Creatinine, Ser: 0.97 mg/dL (ref 0.57–1.00)
Glucose: 89 mg/dL (ref 70–99)
Potassium: 3.8 mmol/L (ref 3.5–5.2)
Sodium: 142 mmol/L (ref 134–144)
eGFR: 57 mL/min/1.73 — ABNORMAL LOW (ref >59)

## 2024-03-22 LAB — SEDIMENTATION RATE: Sed Rate: 38 mm/h (ref 0–40)

## 2024-03-22 LAB — BRAIN NATRIURETIC PEPTIDE: BNP: 57.3 pg/mL (ref 0.0–100.0)

## 2024-03-22 NOTE — Progress Notes (Signed)
 Spoke with pt regarding her labs, pt confirmed understanding nfn

## 2024-03-28 DIAGNOSIS — J449 Chronic obstructive pulmonary disease, unspecified: Secondary | ICD-10-CM | POA: Diagnosis not present

## 2024-03-30 DIAGNOSIS — N1831 Chronic kidney disease, stage 3a: Secondary | ICD-10-CM | POA: Diagnosis not present

## 2024-03-30 DIAGNOSIS — R600 Localized edema: Secondary | ICD-10-CM | POA: Diagnosis not present

## 2024-03-30 DIAGNOSIS — Z0000 Encounter for general adult medical examination without abnormal findings: Secondary | ICD-10-CM | POA: Diagnosis not present

## 2024-03-30 DIAGNOSIS — R82998 Other abnormal findings in urine: Secondary | ICD-10-CM | POA: Diagnosis not present

## 2024-03-30 DIAGNOSIS — M8580 Other specified disorders of bone density and structure, unspecified site: Secondary | ICD-10-CM | POA: Diagnosis not present

## 2024-03-30 DIAGNOSIS — K529 Noninfective gastroenteritis and colitis, unspecified: Secondary | ICD-10-CM | POA: Diagnosis not present

## 2024-03-30 DIAGNOSIS — Z1389 Encounter for screening for other disorder: Secondary | ICD-10-CM | POA: Diagnosis not present

## 2024-03-30 DIAGNOSIS — R946 Abnormal results of thyroid function studies: Secondary | ICD-10-CM | POA: Diagnosis not present

## 2024-03-30 DIAGNOSIS — Z1331 Encounter for screening for depression: Secondary | ICD-10-CM | POA: Diagnosis not present

## 2024-03-30 DIAGNOSIS — J9611 Chronic respiratory failure with hypoxia: Secondary | ICD-10-CM | POA: Diagnosis not present

## 2024-03-30 DIAGNOSIS — E785 Hyperlipidemia, unspecified: Secondary | ICD-10-CM | POA: Diagnosis not present

## 2024-03-30 DIAGNOSIS — E669 Obesity, unspecified: Secondary | ICD-10-CM | POA: Diagnosis not present

## 2024-03-30 DIAGNOSIS — R768 Other specified abnormal immunological findings in serum: Secondary | ICD-10-CM | POA: Diagnosis not present

## 2024-03-30 DIAGNOSIS — I129 Hypertensive chronic kidney disease with stage 1 through stage 4 chronic kidney disease, or unspecified chronic kidney disease: Secondary | ICD-10-CM | POA: Diagnosis not present

## 2024-03-30 DIAGNOSIS — Z23 Encounter for immunization: Secondary | ICD-10-CM | POA: Diagnosis not present

## 2024-03-30 DIAGNOSIS — I4891 Unspecified atrial fibrillation: Secondary | ICD-10-CM | POA: Diagnosis not present

## 2024-04-03 ENCOUNTER — Ambulatory Visit: Admitting: Internal Medicine

## 2024-04-03 ENCOUNTER — Encounter (INDEPENDENT_AMBULATORY_CARE_PROVIDER_SITE_OTHER): Payer: Self-pay | Admitting: Gastroenterology

## 2024-04-11 DIAGNOSIS — L409 Psoriasis, unspecified: Secondary | ICD-10-CM | POA: Diagnosis not present

## 2024-04-11 DIAGNOSIS — M1A09X0 Idiopathic chronic gout, multiple sites, without tophus (tophi): Secondary | ICD-10-CM | POA: Diagnosis not present

## 2024-04-11 DIAGNOSIS — Z79899 Other long term (current) drug therapy: Secondary | ICD-10-CM | POA: Diagnosis not present

## 2024-04-11 DIAGNOSIS — M0579 Rheumatoid arthritis with rheumatoid factor of multiple sites without organ or systems involvement: Secondary | ICD-10-CM | POA: Diagnosis not present

## 2024-04-11 DIAGNOSIS — E669 Obesity, unspecified: Secondary | ICD-10-CM | POA: Diagnosis not present

## 2024-04-11 DIAGNOSIS — Z6832 Body mass index (BMI) 32.0-32.9, adult: Secondary | ICD-10-CM | POA: Diagnosis not present

## 2024-04-11 DIAGNOSIS — M1991 Primary osteoarthritis, unspecified site: Secondary | ICD-10-CM | POA: Diagnosis not present

## 2024-04-27 ENCOUNTER — Ambulatory Visit (HOSPITAL_COMMUNITY)

## 2024-04-27 ENCOUNTER — Ambulatory Visit (HOSPITAL_COMMUNITY)
Admission: RE | Admit: 2024-04-27 | Discharge: 2024-04-27 | Disposition: A | Discharge status: Home / Self Care | Source: Ambulatory Visit | Attending: Internal Medicine | Admitting: Internal Medicine

## 2024-04-27 DIAGNOSIS — R0609 Other forms of dyspnea: Secondary | ICD-10-CM | POA: Diagnosis not present

## 2024-04-27 DIAGNOSIS — J449 Chronic obstructive pulmonary disease, unspecified: Secondary | ICD-10-CM | POA: Diagnosis not present

## 2024-04-27 LAB — PULMONARY FUNCTION TEST
DL/VA % pred: 121 %
DL/VA: 4.84 ml/min/mmHg/L
DLCO unc % pred: 68 %
DLCO unc: 14.23 ml/min/mmHg
FEF 25-75 Post: 1.04 L/s
FEF 25-75 Pre: 0.7 L/s
FEF2575-%Change-Post: 49 %
FEF2575-%Pred-Post: 75 %
FEF2575-%Pred-Pre: 50 %
FEV1-%Change-Post: 7 %
FEV1-%Pred-Post: 56 %
FEV1-%Pred-Pre: 52 %
FEV1-Post: 1.21 L
FEV1-Pre: 1.13 L
FEV1FVC-%Change-Post: 5 %
FEV1FVC-%Pred-Pre: 98 %
FEV6-%Change-Post: 3 %
FEV6-%Pred-Post: 59 %
FEV6-%Pred-Pre: 56 %
FEV6-Post: 1.61 L
FEV6-Pre: 1.55 L
FEV6FVC-%Change-Post: 1 %
FEV6FVC-%Pred-Post: 105 %
FEV6FVC-%Pred-Pre: 103 %
FVC-%Change-Post: 1 %
FVC-%Pred-Post: 55 %
FVC-%Pred-Pre: 54 %
FVC-Post: 1.61 L
FVC-Pre: 1.58 L
Post FEV1/FVC ratio: 75 %
Post FEV6/FVC ratio: 100 %
Pre FEV1/FVC ratio: 72 %
Pre FEV6/FVC Ratio: 98 %
RV % pred: 86 %
RV: 2.32 L
TLC % pred: 73 %
TLC: 4.17 L

## 2024-04-27 MED ORDER — ALBUTEROL SULFATE (2.5 MG/3ML) 0.083% IN NEBU
2.5000 mg | INHALATION_SOLUTION | Freq: Once | RESPIRATORY_TRACT | Status: AC
Start: 2024-04-27 — End: 2024-04-27
  Administered 2024-04-27: 2.5 mg via RESPIRATORY_TRACT

## 2024-05-01 NOTE — Progress Notes (Signed)
 Spoke with pt regarding results, pt confirmed her understanding

## 2024-05-10 ENCOUNTER — Encounter (INDEPENDENT_AMBULATORY_CARE_PROVIDER_SITE_OTHER): Payer: Self-pay | Admitting: Gastroenterology

## 2024-05-12 NOTE — Progress Notes (Deleted)
 Cardiology Office Note    Date:  05/12/2024  ID:  Mary Moss, DOB Feb 25, 1936, MRN [id number] Cardiologist: Alvan Carrier, MD { :  History of Present Illness:    Mary Moss is a 88 y.o. female  with past medical history of HTN, rheumatoid arthritis, GERD paroxysmal atrial fibrillation/flutter (diagnosed in the setting of Influenza A during admission in 08/2023) who presents to the office today for 39-month follow-up.  She was last examined by myself in 09/2023 following her recent admission and reported still having some weakness but this was improving.  She denied any recent chest pain or palpitations and her heart rate had been well-controlled when checked at home.  He did previously been recommended during her admission to plan for a 30-day monitor to assess for recurrence and if no recurrent arrhythmias, could likely stop Eliquis .  Her 30-day monitor showed predominantly normal sinus rhythm with an average heart rate of 73 bpm and no recurrent arrhythmias.  Therefore, Eliquis  was discontinued.  ROS: ***  Studies Reviewed:   EKG: EKG is*** ordered today and demonstrates ***   EKG Interpretation Date/Time:    Ventricular Rate:    PR Interval:    QRS Duration:    QT Interval:    QTC Calculation:   R Axis:      Text Interpretation:         Echocardiogram: 08/2023 IMPRESSIONS     1. Left ventricular ejection fraction, by estimation, is 55 to 60%. The  left ventricle has normal function. The left ventricle has no regional  wall motion abnormalities. There is mild left ventricular hypertrophy.  Left ventricular diastolic parameters  were normal. The global longitudinal strain is indeterminate.   2. Right ventricular systolic function is normal. The right ventricular  size is normal. Tricuspid regurgitation signal is inadequate for assessing  PA pressure.   3. The mitral valve is normal in structure. No evidence of mitral valve  regurgitation. No evidence  of mitral stenosis.   4. The tricuspid valve is abnormal.   5. The aortic valve was not well visualized. There is mild calcification  of the aortic valve. There is mild thickening of the aortic valve. Aortic  valve regurgitation is not visualized. No aortic stenosis is present.   6. The inferior vena cava is normal in size with greater than 50%  respiratory variability, suggesting right atrial pressure of 3 mmHg.   Comparison(s): No prior Echocardiogram.    Event Monitor: 11/2023   30 day monitor   Min HR 57, Max HR 112, Avg HR 73   Rare supraventricular ectopy   Rare ventricular ectopy   No reported symptoms   No significant arrhythmias  Risk Assessment/Calculations:   {Does this patient have ATRIAL FIBRILLATION?:[id number]} No BP recorded.  {Refresh Note OR Click here to enter BP  :1}***         Physical Exam:   VS:  There were no vitals taken for this visit.   Wt Readings from Last 3 Encounters:  03/16/24 212 lb 6.4 oz (96.3 kg)  01/24/24 211 lb (95.7 kg)  12/24/23 204 lb 8 oz (92.8 kg)     GEN: Well nourished, well developed in no acute distress NECK: No JVD; No carotid bruits CARDIAC: ***RRR, no murmurs, rubs, gallops RESPIRATORY:  Clear to auscultation without rales, wheezing or rhonchi  ABDOMEN: Appears non-distended. No obvious abdominal masses. EXTREMITIES: No clubbing or cyanosis. No edema.  Distal pedal pulses are 2+ bilaterally.   Assessment and Plan:  1. Paroxysmal atrial fibrillation (HCC) -She had an isolated episode during her admission in 08/2023 for influenza A with no recurrence by outpatient monitor. ***  2. Essential hypertension - BP is at ***during today's visit. Continue HCTZ 25 mg daily and Lopressor  37.5 mg twice daily.   Signed, Laymon CHRISTELLA Qua, PA-C

## 2024-05-16 ENCOUNTER — Ambulatory Visit: Admitting: Student

## 2024-05-16 DIAGNOSIS — E669 Obesity, unspecified: Secondary | ICD-10-CM | POA: Diagnosis not present

## 2024-05-16 DIAGNOSIS — M0579 Rheumatoid arthritis with rheumatoid factor of multiple sites without organ or systems involvement: Secondary | ICD-10-CM | POA: Diagnosis not present

## 2024-05-16 DIAGNOSIS — M1A09X0 Idiopathic chronic gout, multiple sites, without tophus (tophi): Secondary | ICD-10-CM | POA: Diagnosis not present

## 2024-05-16 DIAGNOSIS — L409 Psoriasis, unspecified: Secondary | ICD-10-CM | POA: Diagnosis not present

## 2024-05-16 DIAGNOSIS — R5383 Other fatigue: Secondary | ICD-10-CM | POA: Diagnosis not present

## 2024-05-16 DIAGNOSIS — M7989 Other specified soft tissue disorders: Secondary | ICD-10-CM | POA: Diagnosis not present

## 2024-05-16 DIAGNOSIS — I480 Paroxysmal atrial fibrillation: Secondary | ICD-10-CM

## 2024-05-16 DIAGNOSIS — M1991 Primary osteoarthritis, unspecified site: Secondary | ICD-10-CM | POA: Diagnosis not present

## 2024-05-16 DIAGNOSIS — I10 Essential (primary) hypertension: Secondary | ICD-10-CM

## 2024-05-16 DIAGNOSIS — Z6831 Body mass index (BMI) 31.0-31.9, adult: Secondary | ICD-10-CM | POA: Diagnosis not present

## 2024-05-16 DIAGNOSIS — Z79899 Other long term (current) drug therapy: Secondary | ICD-10-CM | POA: Diagnosis not present

## 2024-05-18 DIAGNOSIS — M0579 Rheumatoid arthritis with rheumatoid factor of multiple sites without organ or systems involvement: Secondary | ICD-10-CM | POA: Diagnosis not present

## 2024-05-30 DIAGNOSIS — L280 Lichen simplex chronicus: Secondary | ICD-10-CM | POA: Diagnosis not present

## 2024-05-30 DIAGNOSIS — Z01419 Encounter for gynecological examination (general) (routine) without abnormal findings: Secondary | ICD-10-CM | POA: Diagnosis not present

## 2024-06-08 DIAGNOSIS — H6122 Impacted cerumen, left ear: Secondary | ICD-10-CM | POA: Diagnosis not present

## 2024-06-15 ENCOUNTER — Ambulatory Visit: Admitting: Internal Medicine

## 2024-06-15 ENCOUNTER — Encounter: Payer: Self-pay | Admitting: Internal Medicine

## 2024-06-15 VITALS — BP 141/75 | HR 81 | Ht 68.0 in | Wt 203.2 lb

## 2024-06-15 DIAGNOSIS — Z87891 Personal history of nicotine dependence: Secondary | ICD-10-CM | POA: Diagnosis not present

## 2024-06-15 DIAGNOSIS — R0609 Other forms of dyspnea: Secondary | ICD-10-CM | POA: Diagnosis not present

## 2024-06-15 DIAGNOSIS — J9611 Chronic respiratory failure with hypoxia: Secondary | ICD-10-CM

## 2024-06-15 NOTE — Patient Instructions (Addendum)
Make sure you check your oxygen saturation at your highest level of activity(NOT after you stop)  to be sure it stays over 90% and keep track of it at least once a week, more often if breathing getting worse, and let me know if losing ground. (Collect the dots to connect the dots approach)     Pulmonary follow up is as needed        

## 2024-06-15 NOTE — Progress Notes (Signed)
 Mary Moss, female    DOB: [birth year]    MRN: [id number]   Brief patient profile:  55  yowf  quit smoking 2009 s respiratory symptoms dx RA around 2022  referred to pulmonary clinic in [place]  03/16/2024 by Dr Nichole  for  persistent hypoxemia p CAP admit  Admit date:     12/24/2023  Discharge date: 12/27/23   Hospital Course: 88 y/o female with history of hypertension, GERD/PUD, CKD stage III, seropositive rheumatoid arthritis, hyperlipidemia, impaired glucose tolerance, and vitamin D  deficiency presenting with 2-day history of generalized weakness, nonproductive cough, nasal congestion,  myalgias, arthralgias, and fevers and chills. She went to  urgent care about 3 weeks before this admission and was placed on an abx and prednisone  and she improved.  However, her symptoms worsened again 2 days prior to admission.  She went to urgent care again and she was found to be hypoxic and sent to the ED for further evaluation.   She denies cp, hemoptysis, n/v/d, abd pain, dysuria, headache.   In the ED she had a low grade temp of 99.2, but was hemodynamically stable.  Her oxygen  saturation was 89% on RA and she was placed on 2L with saturation 94-96%.  She was given IV fluids and started on ceftriaxone  and azithromycin .  CTA chest showed bilateral LL consolidation without edema or effusions.     The patient was started on bronchodilators IV Solu-Medrol .  She was continued on ceftriaxone  and azithromycin .  She improved clinically.  On day of discharge, ambulatory pulse ox on room air showed desaturation down to 81%.  She was discharged home on 2 L nasal cannula.   Assessment and Plan: Severe Sepsis -due to pneumonia -presented with tachycardia, leukocytosis -lactic peaked 3.1 -continue ceftriaxone  and azithro -initially on IVF -sepsis physiology resovled   Acute respiratory failure with hypoxia -Secondary to pneumonia -Initially placed on 2L -Wean oxygen  for saturation greater  90% -discharge home with 2L [place]   Lobar Pneumonia/COPD Exacerbation -she has 40 pack year hx of tobacco, quit 16 yr ago -continue xopenex  -added yupelri  -continuee pulmicort  -continued brovana  -continue IV solumedrol -5/30 CTA chest as discussed above -continue ceftriaxone  and azithro -d/c home with cefdinir  and azithro x 3 more days -d/c home with prednisone  taper   Paroxysmal atrial fibrillation -developed afib her last hospitalization 08/2023 -had 30 day monitor without recurrent afib 10/26/23 -apixaban  stopped -remains in sinus   GERD -hx of PUD -continue pantoprazole    Seropositive RA -receives golinumab every 2 months--in the past week -restart leflunomide  and hydroxychloroquine    Essential HTN -holding hydrochlorothiazide  to allow BP margin -restart metoprolol  tartrate -BP controlled   Gout -restart allopurinol    Hypokalemia/hypomagnesemia/hypophosphatemia -repleted  Pt not previously seen by PCCM service.    Wt when quit smoking 170 baseline    History of Present Illness  03/16/2024  Pulmonary/ 1st office eval/ Mary Moss / [facility] @ wt 212 lb on no resp rx  Chief Complaint  Patient presents with   Establish Care    Had pneu and flu - o2 desat DOE  Dyspnea:  walking a block at slow pace flat grade ok Visits   neighbor  3 x weekly / [employer] ok pushing cart whole store slow pace  Cough: none  Sleep: recliner x years x 30 degrees  SABA use: occ use cause I'm supposed to  but doesn't think it does anything / also stopped trelegy didn't think it helped  02: 2lpm hs / none daytime  Patient Instructions  Make sure you check your oxygen  saturation at your highest level of activity (NOT after you stop)  to be sure it stays over 90% and keep track of it at least once a week, more often if breathing getting worse, and let me know if losing ground. (Collect the dots to connect the dots approach)   Continue 2lpm oxygen  at bedtime Use Incentrive spirometer  as much as possible   PFT's 04/27/24 FEV1 1.21 (56 % ) ratio 0.75 p 7 % improvement from saba p 0 prior to study with DLCO 14.23 (68%) and FV curve nl   06/15/2024  f/u ov/Mary Moss office/Mary Moss re: RA and  restrictive changes on PFTs on noct 02  maint on simponi  aria infusions since 4 weeks prior to OV    Chief Complaint  Patient presents with   Shortness of Breath    DOE  Dyspnea:  [employer] pushing the cart / all housework  except the floors s limiting doe Cough: rarely and not productive  Sleeping: recliner at about 30 degrees s resp cc  SABA use: none  02: stopped it and prefers not to start it back   No obvious day to day or daytime variability or assoc excess/ purulent sputum or mucus plugs or hemoptysis or cp or chest tightness, subjective wheeze or overt sinus or hb symptoms.    Also denies any obvious fluctuation of symptoms with weather or environmental changes or other aggravating or alleviating factors except as outlined above   No unusual exposure hx or h/o childhood pna/ asthma or knowledge of premature birth.  Current Allergies, Complete Past Medical History, Past Surgical History, Family History, and Social History were reviewed in [employer] record.  ROS  The following are not active complaints unless bolded Hoarseness, sore throat, dysphagia, dental problems, itching, sneezing,  nasal congestion or discharge of excess mucus or purulent secretions, ear ache,   fever, chills, sweats, unintended wt loss or wt gain, classically pleuritic or exertional cp,  orthopnea pnd or arm/hand swelling  or leg swelling, presyncope, palpitations, abdominal pain, anorexia, nausea, vomiting, diarrhea  or change in bowel habits or change in bladder habits, change in stools or change in urine, dysuria, hematuria,  rash, arthralgias, visual complaints, headache, numbness, weakness or ataxia or problems with walking or coordination,  change in mood or  memory.         Current Meds  Medication Sig   acetaminophen  (TYLENOL ) 325 MG tablet Take 2 tablets (650 mg total) by mouth every 6 (six) hours as needed for mild pain (or Fever >/= 101).   allopurinol  (ZYLOPRIM ) 100 MG tablet Take 100 mg by mouth daily. (Patient taking differently: Take 100 mg by mouth as needed.)   Biotin 5000 MCG TABS 1 tablet Orally Once a day   clobetasol ointment (TEMOVATE) 0.05 % Apply 1 Application topically 2 (two) times daily.   Cyanocobalamin (VITAMIN B12) 1000 MCG TABS 2.5 tablet Orally Once a day   D3-50 1.25 MG (50000 UT) capsule Take 50,000 Units by mouth every Sunday.   diphenhydramine -acetaminophen  (TYLENOL  PM EXTRA STRENGTH) 25-500 MG TABS tablet Take 1 tablet by mouth at bedtime.   furosemide  (LASIX ) 20 MG tablet Take 20 mg by mouth daily.   golimumab  (SIMPONI  ARIA) 50 MG/4ML SOLN injection Inject 50 mg into the vein once.   hydroxychloroquine  (PLAQUENIL ) 200 MG tablet Take 400 mg by mouth 2 (two) times daily.   Metoprolol  Tartrate 37.5 MG TABS Take 1 tablet (37.5  mg total) by mouth 2 (two) times daily.         Past Medical History:  Diagnosis Date   Ankle fracture fx 13 yrs ago, anklw swells off and on, has cramps in ankle also   left, to seee dr hewitt about 02-16-14   Arthritis    osteo and RA   Basal cell carcinoma 2015   generalized   Difficult intubation    GERD (gastroesophageal reflux disease)    OTC med   pt. denies   Hypertension    Pneumonia    Pre-diabetes       Objective:    Wts    06/15/2024    203   03/16/24 212 lb 6.4 oz (96.3 kg)  01/24/24 211 lb (95.7 kg)  12/24/23 204 lb 8 oz (92.8 kg)      Vital signs reviewed  06/15/2024  - Note at rest 02 sats  98% on RA   General appearance:    amb wf nad minimal pseudowheeze       HEENT : Oropharynx  clear        NECK :  without  apparent JVD/ palpable Nodes/TM    LUNGS: no acc muscle use,  Nl contour chest which is clear to A and P bilaterally without cough on insp or exp  maneuvers   CV:  RRR  no s3 or murmur or increase in P2, and no edema   ABD:  soft and nontender   MS:   ext warm without deformities Or obvious joint restrictions  calf tenderness, cyanosis or clubbing    SKIN: warm and dry without lesions    NEURO:  alert, approp, nl sensorium with  no motor or cerebellar deficits apparent.      Assessment   Assessment & Plan Chronic respiratory failure with hypoxia (HCC) - Resolved > pt d/c'd all 02 on her own an prefers not to restart it.  DOE (dyspnea on exertion) Quit smoking 2009/MM  at wt 170 - 03/16/2024   Walked @ wt 212 on RA  x  3  lap(s) =  approx 450  ft  @ slow to mod pace, stopped due to end of study  with lowest 02 sats 89% but improved on last lap to 91%  - DOE labs 03/16/2024 ok with neg Quant TB/  Eos 0.4 / alpha one phenotype  MM level 183  - PFT's  04/27/24  FEV1 1.21 (56 % ) ratio 0.75  p 7 % improvement from saba p 0 prior to study with DLCO  14.23 (68%)   and FV curve nl     No evidece of RA lung dz of any typie at this point and note RA symptoms have improved as well on present rx.   Summary discussion with pt re longterms concerns with RA and RA meds contributing to doe with the best way to monitor :  02 sats at peak levels of exercise      Each maintenance medication was reviewed in detail including emphasizing most importantly the difference between maintenance and prns and under what circumstances the prns are to be triggered using an action plan format where appropriate.  Total time for H and P, chart review, counseling, reviewing pulse 0x  device(s) and generating customized AVS unique to this office visit / same day charting = 30 min        Pulmonary f/u is prn     AVS  Patient Instructions  Make sure you check your  oxygen  saturation at your highest level of activity (NOT after you stop)  to be sure it stays over 90% and keep track of it at least once a week, more often if breathing getting worse, and let me  know if losing ground. (Collect the dots to connect the dots approach)     Pulmonary follow up is as needed          Mary America, MD 06/16/2024

## 2024-06-16 NOTE — Assessment & Plan Note (Addendum)
 Quit smoking 2009/MM  at wt 170 - 03/16/2024   Walked @ wt 212 on RA  x  3  lap(s) =  approx 450  ft  @ slow to mod pace, stopped due to end of study  with lowest 02 sats 89% but improved on last lap to 91%  - DOE labs 03/16/2024 ok with neg Quant TB/  Eos 0.4 / alpha one phenotype  MM level 183  - PFT's  04/27/24  FEV1 1.21 (56 % ) ratio 0.75  p 7 % improvement from saba p 0 prior to study with DLCO  14.23 (68%)   and FV curve nl     No evidece of RA lung dz of any typie at this point and note RA symptoms have improved as well on present rx.   Summary discussion with pt re longterms concerns with RA and RA meds contributing to doe with the best way to monitor :  02 sats at peak levels of exercise      Each maintenance medication was reviewed in detail including emphasizing most importantly the difference between maintenance and prns and under what circumstances the prns are to be triggered using an action plan format where appropriate.  Total time for H and P, chart review, counseling, reviewing pulse 0x  device(s) and generating customized AVS unique to this office visit / same day charting = 30 min        Pulmonary f/u is prn

## 2024-06-16 NOTE — Assessment & Plan Note (Signed)
-   Resolved > pt d/c'd all 02 on her own an prefers not to restart it.

## 2024-06-19 DIAGNOSIS — M0579 Rheumatoid arthritis with rheumatoid factor of multiple sites without organ or systems involvement: Secondary | ICD-10-CM | POA: Diagnosis not present

## 2024-06-20 ENCOUNTER — Encounter (HOSPITAL_COMMUNITY)

## 2024-07-03 ENCOUNTER — Encounter (INDEPENDENT_AMBULATORY_CARE_PROVIDER_SITE_OTHER): Payer: Self-pay | Admitting: Gastroenterology

## 2024-07-03 ENCOUNTER — Ambulatory Visit (INDEPENDENT_AMBULATORY_CARE_PROVIDER_SITE_OTHER): Admitting: Gastroenterology

## 2024-07-03 VITALS — BP 163/89 | HR 74 | Temp 97.8°F | Ht 68.0 in | Wt 204.8 lb

## 2024-07-03 DIAGNOSIS — R194 Change in bowel habit: Secondary | ICD-10-CM

## 2024-07-03 DIAGNOSIS — R197 Diarrhea, unspecified: Secondary | ICD-10-CM | POA: Diagnosis not present

## 2024-07-03 NOTE — Progress Notes (Signed)
 Toribio Fortune, M.D. Gastroenterology & Hepatology [facility] [address] [place], [place] [address]  Primary Care Physician: Nichole Senior, MD [address] [place] [address]  I will communicate my assessment and recommendations to the referring MD via EMR.  Problems: Loose bowel movements NSAID induced gastric ulcers Erythematous duodenopathy   History of Present Illness: Mary Moss is a 88 y.o. female with history of recent hip replacement, GERD, hypertension, basal cell carcinoma and recent NSAID induced gastric ulcers, who presents for follow-up of loose bowel movements.  The patient was last seen on 01/24/2024. At that time, the patient had celiac disease panel and TSH checked, as well as pancreatic elastase, fecal fat and GI pathogen panel, all of which were within normal limits.  She was started on Benefiber supplements.  Patient was offered to start a bile salt binder or colonoscopy but she reported she wanted to hold off on this in the meantime.  Patient reports that her bowel movements have improved. She no longer has urgency, which has been a major improvement. She has been taking Culturelle for the last 3 months, which has led to significant improvement in her bowel movements. She is now having a BM after breakfast. No longer taking Benefiber. She is now having 2 Bms per day. Stools are soft but not watery anymore. The patient denies having any nausea, vomiting, fever, chills, hematochezia, melena, hematemesis, abdominal distention, abdominal pain, diarrhea, jaundice, pruritus or weight loss.  Last EGD: 04/07/2022 - 1 cm hiatal hernia. - Scar in the gastric antrum. - Normal examined duodenum. - No specimens collected.  Last Colonoscopy:01/21/2022  diverticulosis and nonbleeding internal hemorrhoids.   Past Medical History: Past Medical History:  Diagnosis Date   Ankle fracture fx 13 yrs ago, anklw swells off and  on, has cramps in ankle also   left, to seee dr hewitt about 02-16-14   Arthritis    osteo and RA   Basal cell carcinoma 2015   generalized   Difficult intubation    GERD (gastroesophageal reflux disease)    OTC med   pt. denies   Hypertension    Pneumonia    Pre-diabetes     Past Surgical History: Past Surgical History:  Procedure Laterality Date   ABDOMINAL HYSTERECTOMY     BIOPSY  01/21/2022   Procedure: BIOPSY;  Surgeon: Fortune Flavors, Toribio, MD;  [facility]: [facility];  Service: Gastroenterology;;  gastric ulcer biopsies, gastric biopsies;   COLONOSCOPY WITH PROPOFOL  N/A 01/21/2022   Procedure: COLONOSCOPY WITH PROPOFOL ;  Surgeon: Fortune Flavors Toribio, MD;  [facility]: [facility];  Service: Gastroenterology;  Laterality: N/A;   ESOPHAGOGASTRODUODENOSCOPY (EGD) WITH PROPOFOL  N/A 01/21/2022   Procedure: ESOPHAGOGASTRODUODENOSCOPY (EGD) WITH PROPOFOL ;  Surgeon: Fortune Flavors Toribio, MD;  [facility]: [facility];  Service: Gastroenterology;  Laterality: N/A;   ESOPHAGOGASTRODUODENOSCOPY (EGD) WITH PROPOFOL  N/A 04/07/2022   Procedure: ESOPHAGOGASTRODUODENOSCOPY (EGD) WITH PROPOFOL ;  Surgeon: Fortune Flavors Toribio, MD;  [facility]: [facility];  Service: Gastroenterology;  Laterality: N/A;  1100 ASA 3   EYE SURGERY Bilateral    bilaterla cataract extraction with IOL   FRACTURE SURGERY Left 2001   JOINT REPLACEMENT     KNEE ARTHROSCOPY Right 02/07/2014   Procedure: RIGHT ARTHROSCOPY KNEE, WITH MEDIAL MENISCAL DEBRIDEMENT AND CHONDRAPLASTY;  Surgeon: Dempsey Melodi GAILS, MD;  Location: [facility];  Service: Orthopedics;  Laterality: Right;   ROTATOR CUFF REPAIR Right 2009   TOTAL ANKLE ARTHROPLASTY Left 12/20/2014   Procedure: LEFT TOTAL  ANKLE ARTHOPLASTY WITH PERCUTANEOUS ACHILLES TENDON LENGTHENING;  Surgeon: Norleen Armor, MD;  Location: [facility];  Service: Orthopedics;  Laterality: Left;   TOTAL HIP ARTHROPLASTY Left 12/31/2021   Procedure: TOTAL HIP ARTHROPLASTY ANTERIOR  APPROACH;  Surgeon: Melodi Lerner, MD;  Location: [facility];  Service: Orthopedics;  Laterality: Left;   TOTAL KNEE ARTHROPLASTY Right 06/08/2016   Procedure: RIGHT TOTAL KNEE ARTHROPLASTY;  Surgeon: Lerner Melodi, MD;  Location: [facility];  Service: Orthopedics;  Laterality: Right;   TOTAL SHOULDER ARTHROPLASTY Right 11/23/2019   Procedure: REVERSE TOTAL SHOULDER ARTHROPLASTY;  Surgeon: Dozier Soulier, MD;  Location: [facility];  Service: Orthopedics;  Laterality: Right;    Family History: Family History  Problem Relation Age of Onset   Cancer Mother        colon   Cancer Father        lung   Cancer Sister        lung cancer-nonsmoker   Alzheimer's disease Brother 16    Social History: Social History   Tobacco Use  Smoking Status Former   Current packs/day: 0.00   Average packs/day: 0.5 packs/day for 50.4 years (25.2 ttl pk-yrs)   Types: Cigarettes   Start date: 07/27/1957   Quit date: 12/15/2007   Years since quitting: 16.5   Passive exposure: Past  Smokeless Tobacco Never   Social History   Substance and Sexual Activity  Alcohol  Use Not Currently   Comment: rare   Social History   Substance and Sexual Activity  Drug Use No    Allergies: Allergies  Allergen Reactions   Statins Other (See Comments)    Simvastatin, crestor, livalo Not able to walk because so much muscle/joint pain   Bacitracin-Polymyxin B Rash   Neosporin [Neomycin-Bacitracin Zn-Polymyx] Rash    Medications: Current Outpatient Medications  Medication Sig Dispense Refill   acetaminophen  (TYLENOL ) 325 MG tablet Take 2 tablets (650 mg total) by mouth every 6 (six) hours as needed for mild pain (or Fever >/= 101). 15 tablet 0   allopurinol  (ZYLOPRIM ) 100 MG tablet Take 100 mg by mouth daily. (Patient taking differently: Take 100 mg by mouth as needed.)     Biotin 5000 MCG TABS 1 tablet Orally Once a day     clobetasol ointment (TEMOVATE) 0.05 % Apply 1 Application topically 2 (two) times daily. (Patient  taking differently: Apply 1 Application topically as needed.)     Cyanocobalamin (VITAMIN B12) 1000 MCG TABS 2.5 tablet Orally Once a day     D3-50 1.25 MG (50000 UT) capsule Take 50,000 Units by mouth every Sunday. (Patient taking differently: Take 50,000 Units by mouth. Every other week)     diphenhydramine -acetaminophen  (TYLENOL  PM EXTRA STRENGTH) 25-500 MG TABS tablet Take 1 tablet by mouth at bedtime.     furosemide  (LASIX ) 20 MG tablet Take 20 mg by mouth daily.     golimumab  (SIMPONI  ARIA) 50 MG/4ML SOLN injection Inject 50 mg into the vein once. (Patient taking differently: Inject 50 mg into the vein once. Every eight weeks.)     hydroxychloroquine  (PLAQUENIL ) 200 MG tablet Take 400 mg by mouth 2 (two) times daily.     Metoprolol  Tartrate 37.5 MG TABS Take 1 tablet (37.5 mg total) by mouth 2 (two) times daily. 180 tablet 3   No current facility-administered medications for this visit.    Review of Systems: GENERAL: negative for malaise, night sweats HEENT: No changes in hearing or vision, no nose bleeds or other nasal problems. NECK: Negative for lumps, goiter,  pain and significant neck swelling RESPIRATORY: Negative for cough, wheezing CARDIOVASCULAR: Negative for chest pain, leg swelling, palpitations, orthopnea GI: SEE HPI MUSCULOSKELETAL: Negative for joint pain or swelling, back pain, and muscle pain. SKIN: Negative for lesions, rash PSYCH: Negative for sleep disturbance, mood disorder and recent psychosocial stressors. HEMATOLOGY Negative for prolonged bleeding, bruising easily, and swollen nodes. ENDOCRINE: Negative for cold or heat intolerance, polyuria, polydipsia and goiter. NEURO: negative for tremor, gait imbalance, syncope and seizures. The remainder of the review of systems is noncontributory.   Physical Exam: BP (!) 163/89 (BP Location: Left Arm, Patient Position: Sitting, Cuff Size: Large)   Pulse 74   Temp 97.8 F (36.6 C) (Temporal)   Ht 5' 8 (1.727 m)    Wt 204 lb 12.8 oz (92.9 kg)   BMI 31.14 kg/m  GENERAL: The patient is AO x3, in no acute distress. HEENT: Head is normocephalic and atraumatic. EOMI are intact. Mouth is well hydrated and without lesions. NECK: Supple. No masses LUNGS: Clear to auscultation. No presence of rhonchi/wheezing/rales. Adequate chest expansion HEART: RRR, normal s1 and s2. ABDOMEN: Soft, nontender, no guarding, no peritoneal signs, and nondistended. BS +. No masses. EXTREMITIES: Without any cyanosis, clubbing, rash, lesions or edema. NEUROLOGIC: AOx3, no focal motor deficit. SKIN: no jaundice, no rashes  Imaging/Labs: as above  I personally reviewed and interpreted the available labs, imaging and endoscopic files.  Impression and Plan: Mary Moss is a 88 y.o. female with history of recent hip replacement, GERD, hypertension, basal cell carcinoma and recent NSAID induced gastric ulcers, who presents for follow-up of loose bowel movements.  Patient had new onset of changes in her bowel movements, which improved with the use of probiotics recently.  Had stool testing that was negative for infections or EPI.  As she has been that improvement of her symptoms without ongoing fecal urgency, we will continue with the same strategy of daily probiotics.  If her symptoms were to recur, we may need to discuss possibility of a bile salt binder versus colonoscopy.  -Continue Culturelle probiotic daily  All questions were answered.      Toribio Fortune, MD Gastroenterology and Hepatology [facility]

## 2024-07-03 NOTE — Patient Instructions (Signed)
 Continue Culturelle probiotic daily

## 2024-07-06 DIAGNOSIS — L2089 Other atopic dermatitis: Secondary | ICD-10-CM | POA: Diagnosis not present

## 2024-07-06 DIAGNOSIS — L821 Other seborrheic keratosis: Secondary | ICD-10-CM | POA: Diagnosis not present

## 2024-07-10 NOTE — Progress Notes (Unsigned)
 Cardiology Office Note    Date:  07/12/2024  ID:  Mary Moss, DOB December 29, 1935, MRN [id number] Cardiologist: Alvan Carrier, MD { :  History of Present Illness:    Mary Moss is a 88 y.o. female with past medical history of HTN, rheumatoid arthritis, GERD and paroxysmal atrial fibrillation/flutter (diagnosed in the setting of Influenza A during admission in 08/2023) who presents to the office today for 34-month follow-up.   She was last examined by myself in 09/2023 following her recent admission and reported still having some weakness but this was improving. She denied any recent chest pain or palpitations and her heart rate had been well-controlled when checked at home. It had previously been recommended during her admission to plan for a 30-day monitor to assess for recurrence and if no recurrent arrhythmias, could likely stop Eliquis . Her 30-day monitor showed predominantly normal sinus rhythm with an average heart rate of 73 bpm and no recurrent arrhythmias. Therefore, Eliquis  was discontinued.  She was hospitalized in 12/2023 for severe sepsis in the setting of lobar pneumonia. By review of notes, she did remain in sinus rhythm during that admission.  In talking with the patient today, she reports overall feeling well over the past few months. Says that her breathing is now back to baseline. She denies any specific dyspnea on exertion, orthopnea or PND. She does take Lasix  daily but reports frequent urination with this and feels drained with the medication. She has not noticed significant lower extremity edema. She denies any specific chest pain or palpitations. Says that her oxygen  levels have been appropriate when checked at home and heart rate has typically been in the 70's.  Studies Reviewed:   EKG: EKG is not ordered today.  Echocardiogram: 08/2023 IMPRESSIONS     1. Left ventricular ejection fraction, by estimation, is 55 to 60%. The  left ventricle has normal  function. The left ventricle has no regional  wall motion abnormalities. There is mild left ventricular hypertrophy.  Left ventricular diastolic parameters  were normal. The global longitudinal strain is indeterminate.   2. Right ventricular systolic function is normal. The right ventricular  size is normal. Tricuspid regurgitation signal is inadequate for assessing  PA pressure.   3. The mitral valve is normal in structure. No evidence of mitral valve  regurgitation. No evidence of mitral stenosis.   4. The tricuspid valve is abnormal.   5. The aortic valve was not well visualized. There is mild calcification  of the aortic valve. There is mild thickening of the aortic valve. Aortic  valve regurgitation is not visualized. No aortic stenosis is present.   6. The inferior vena cava is normal in size with greater than 50%  respiratory variability, suggesting right atrial pressure of 3 mmHg.   Comparison(s): No prior Echocardiogram.      Event Monitor: 11/2023   30 day monitor   Min HR 57, Max HR 112, Avg HR 73   Rare supraventricular ectopy   Rare ventricular ectopy   No reported symptoms   No significant arrhythmias  Physical Exam:   VS:  BP 134/82 (BP Location: Left Arm, Cuff Size: Large)   Pulse 64   Ht 5' 8 (1.727 m)   Wt 205 lb 9.6 oz (93.3 kg)   SpO2 96%   BMI 31.26 kg/m    Wt Readings from Last 3 Encounters:  07/12/24 205 lb 9.6 oz (93.3 kg)  07/03/24 204 lb 12.8 oz (92.9 kg)  06/15/24 203 lb 3.2  oz (92.2 kg)     GEN: Pleasant female appearing in no acute distress NECK: No JVD; No carotid bruits CARDIAC: RRR, no murmurs, rubs, gallops RESPIRATORY:  Clear to auscultation without rales, wheezing or rhonchi  ABDOMEN: Appears non-distended. No obvious abdominal masses. EXTREMITIES: No clubbing or cyanosis. No pitting edema.  Distal pedal pulses are 2+ bilaterally.   Assessment and Plan:   1. Paroxysmal atrial fibrillation (HCC) - She had an isolated episode  during her admission in 08/2023 for Influenza A with no recurrence by outpatient monitor. Eliquis  was discontinued given her isolated event. I did encourage her to follow her heart rate on her pulse oximeter and to make us  aware if she has recurrent tachycardia.  For now, will continue Lopressor  37.5 mg twice daily. No longer on anticoagulation.   2. Essential hypertension - BP is at 134/82 during today's visit. Continue HCTZ 25 mg daily and Lopressor  37.5 mg twice daily. Given no recent issues with fluid and due to her being on HCTZ, will reduce Lasix  from 20 mg daily to 20 mg as needed for worsening edema, weight gain greater than 2 pounds overnight or 5 pounds in 1 week.   Signed, Laymon CHRISTELLA Qua, PA-C

## 2024-07-12 ENCOUNTER — Ambulatory Visit: Attending: Student | Admitting: Student

## 2024-07-12 ENCOUNTER — Encounter: Payer: Self-pay | Admitting: Student

## 2024-07-12 VITALS — BP 134/82 | HR 64 | Ht 68.0 in | Wt 205.6 lb

## 2024-07-12 DIAGNOSIS — I10 Essential (primary) hypertension: Secondary | ICD-10-CM

## 2024-07-12 DIAGNOSIS — I480 Paroxysmal atrial fibrillation: Secondary | ICD-10-CM

## 2024-07-12 NOTE — Patient Instructions (Addendum)
 Take Lasix  as needed for worsening edema, weight gain greater than 2 pounds overnight or 5 pounds in 1 week.   Medication Instructions:  Decrease Lasix  to as needed   *If you need a refill on your cardiac medications before your next appointment, please call your pharmacy*  Lab Work: NONE   If you have labs (blood work) drawn today and your tests are completely normal, you will receive your results only by: MyChart Message (if you have MyChart) OR A paper copy in the mail If you have any lab test that is abnormal or we need to change your treatment, we will call you to review the results.  Testing/Procedures: NONE   Follow-Up: At [facility], you and your health needs are our priority.  As part of our continuing mission to provide you with exceptional heart care, our providers are all part of one team.  This team includes your primary Cardiologist (physician) and Advanced Practice Providers or APPs (Physician Assistants and Nurse Practitioners) who all work together to provide you with the care you need, when you need it.  Your next appointment:   1 year(s)  Provider:   You may see Alvan Carrier, MD or one of the following Advanced Practice Providers on your designated Care Team:   Laymon Qua, PA-C  Scotesia Dennison, [place] Olivia Pavy, [place]     We recommend signing up for the patient portal called MyChart.  Sign up information is provided on this After Visit Summary.  MyChart is used to connect with patients for Virtual Visits (Telemedicine).  Patients are able to view lab/test results, encounter notes, upcoming appointments, etc.  Non-urgent messages can be sent to your provider as well.   To learn more about what you can do with MyChart, go to [email or web address].   Other Instructions Thank you for choosing [facility]!
